# Patient Record
Sex: Male | Born: 1935
Health system: Southern US, Community
[De-identification: ages and names within clinical notes are randomized; demographics above are authoritative.]

## PROBLEM LIST (undated history)

## (undated) DIAGNOSIS — N400 Enlarged prostate without lower urinary tract symptoms: Secondary | ICD-10-CM

## (undated) DIAGNOSIS — I4892 Unspecified atrial flutter: Secondary | ICD-10-CM

## (undated) DIAGNOSIS — D649 Anemia, unspecified: Secondary | ICD-10-CM

## (undated) DIAGNOSIS — I5031 Acute diastolic (congestive) heart failure: Secondary | ICD-10-CM

## (undated) DIAGNOSIS — I214 Non-ST elevation (NSTEMI) myocardial infarction: Secondary | ICD-10-CM

## (undated) DIAGNOSIS — I10 Essential (primary) hypertension: Secondary | ICD-10-CM

## (undated) DIAGNOSIS — R0600 Dyspnea, unspecified: Secondary | ICD-10-CM

## (undated) DIAGNOSIS — I5032 Chronic diastolic (congestive) heart failure: Secondary | ICD-10-CM

## (undated) DIAGNOSIS — N289 Disorder of kidney and ureter, unspecified: Secondary | ICD-10-CM

## (undated) HISTORY — PX: CATARACT EXTRACTION, BILATERAL: SHX1313

## (undated) HISTORY — DX: Unspecified atrial flutter: I48.92

---

## 2008-11-12 HISTORY — PX: ABCESS DRAINAGE: SHX399

## 2015-04-18 DIAGNOSIS — M25562 Pain in left knee: Secondary | ICD-10-CM | POA: Diagnosis not present

## 2015-04-18 DIAGNOSIS — M25561 Pain in right knee: Secondary | ICD-10-CM | POA: Diagnosis not present

## 2015-06-18 DIAGNOSIS — E7800 Pure hypercholesterolemia, unspecified: Secondary | ICD-10-CM | POA: Diagnosis not present

## 2015-06-18 DIAGNOSIS — I10 Essential (primary) hypertension: Secondary | ICD-10-CM | POA: Diagnosis not present

## 2015-06-18 DIAGNOSIS — I739 Peripheral vascular disease, unspecified: Secondary | ICD-10-CM | POA: Diagnosis not present

## 2015-06-18 DIAGNOSIS — Z0000 Encounter for general adult medical examination without abnormal findings: Secondary | ICD-10-CM | POA: Diagnosis not present

## 2016-04-03 DIAGNOSIS — Z23 Encounter for immunization: Secondary | ICD-10-CM | POA: Diagnosis not present

## 2016-10-27 DIAGNOSIS — Z6829 Body mass index (BMI) 29.0-29.9, adult: Secondary | ICD-10-CM | POA: Diagnosis not present

## 2016-10-27 DIAGNOSIS — Z0000 Encounter for general adult medical examination without abnormal findings: Secondary | ICD-10-CM | POA: Diagnosis not present

## 2016-10-27 DIAGNOSIS — I10 Essential (primary) hypertension: Secondary | ICD-10-CM | POA: Diagnosis not present

## 2016-10-27 DIAGNOSIS — I739 Peripheral vascular disease, unspecified: Secondary | ICD-10-CM | POA: Diagnosis not present

## 2016-10-27 DIAGNOSIS — E7800 Pure hypercholesterolemia, unspecified: Secondary | ICD-10-CM | POA: Diagnosis not present

## 2016-12-30 DIAGNOSIS — I10 Essential (primary) hypertension: Secondary | ICD-10-CM | POA: Diagnosis not present

## 2016-12-30 DIAGNOSIS — I739 Peripheral vascular disease, unspecified: Secondary | ICD-10-CM | POA: Diagnosis not present

## 2017-02-09 ENCOUNTER — Encounter (HOSPITAL_COMMUNITY): Payer: Self-pay | Admitting: Emergency Medicine

## 2017-02-09 ENCOUNTER — Emergency Department (HOSPITAL_COMMUNITY): Payer: Medicare Other

## 2017-02-09 ENCOUNTER — Inpatient Hospital Stay (HOSPITAL_COMMUNITY)
Admission: EM | Admit: 2017-02-09 | Discharge: 2017-02-16 | DRG: 291 | Disposition: A | Discharge status: Home / Self Care | Payer: Medicare Other | Attending: Internal Medicine | Admitting: Internal Medicine

## 2017-02-09 DIAGNOSIS — I21A1 Myocardial infarction type 2: Secondary | ICD-10-CM | POA: Diagnosis not present

## 2017-02-09 DIAGNOSIS — I5033 Acute on chronic diastolic (congestive) heart failure: Secondary | ICD-10-CM | POA: Diagnosis present

## 2017-02-09 DIAGNOSIS — I4891 Unspecified atrial fibrillation: Secondary | ICD-10-CM | POA: Diagnosis present

## 2017-02-09 DIAGNOSIS — Z7982 Long term (current) use of aspirin: Secondary | ICD-10-CM | POA: Diagnosis not present

## 2017-02-09 DIAGNOSIS — J189 Pneumonia, unspecified organism: Secondary | ICD-10-CM | POA: Diagnosis present

## 2017-02-09 DIAGNOSIS — I10 Essential (primary) hypertension: Secondary | ICD-10-CM | POA: Diagnosis not present

## 2017-02-09 DIAGNOSIS — I5031 Acute diastolic (congestive) heart failure: Secondary | ICD-10-CM | POA: Diagnosis present

## 2017-02-09 DIAGNOSIS — R748 Abnormal levels of other serum enzymes: Secondary | ICD-10-CM | POA: Diagnosis not present

## 2017-02-09 DIAGNOSIS — I4892 Unspecified atrial flutter: Secondary | ICD-10-CM | POA: Diagnosis present

## 2017-02-09 DIAGNOSIS — R946 Abnormal results of thyroid function studies: Secondary | ICD-10-CM | POA: Diagnosis present

## 2017-02-09 DIAGNOSIS — R0602 Shortness of breath: Secondary | ICD-10-CM | POA: Diagnosis not present

## 2017-02-09 DIAGNOSIS — Z880 Allergy status to penicillin: Secondary | ICD-10-CM

## 2017-02-09 DIAGNOSIS — I2510 Atherosclerotic heart disease of native coronary artery without angina pectoris: Secondary | ICD-10-CM | POA: Diagnosis present

## 2017-02-09 DIAGNOSIS — Z79899 Other long term (current) drug therapy: Secondary | ICD-10-CM | POA: Diagnosis not present

## 2017-02-09 DIAGNOSIS — I4510 Unspecified right bundle-branch block: Secondary | ICD-10-CM | POA: Diagnosis present

## 2017-02-09 DIAGNOSIS — I110 Hypertensive heart disease with heart failure: Secondary | ICD-10-CM | POA: Diagnosis present

## 2017-02-09 DIAGNOSIS — R778 Other specified abnormalities of plasma proteins: Secondary | ICD-10-CM

## 2017-02-09 DIAGNOSIS — E876 Hypokalemia: Secondary | ICD-10-CM | POA: Diagnosis present

## 2017-02-09 DIAGNOSIS — J9601 Acute respiratory failure with hypoxia: Secondary | ICD-10-CM | POA: Diagnosis not present

## 2017-02-09 DIAGNOSIS — R7989 Other specified abnormal findings of blood chemistry: Secondary | ICD-10-CM

## 2017-02-09 DIAGNOSIS — I248 Other forms of acute ischemic heart disease: Secondary | ICD-10-CM | POA: Diagnosis present

## 2017-02-09 DIAGNOSIS — R12 Heartburn: Secondary | ICD-10-CM | POA: Diagnosis not present

## 2017-02-09 DIAGNOSIS — I2489 Other forms of acute ischemic heart disease: Secondary | ICD-10-CM | POA: Diagnosis present

## 2017-02-09 DIAGNOSIS — Z23 Encounter for immunization: Secondary | ICD-10-CM | POA: Diagnosis not present

## 2017-02-09 DIAGNOSIS — J810 Acute pulmonary edema: Secondary | ICD-10-CM | POA: Diagnosis not present

## 2017-02-09 DIAGNOSIS — R0600 Dyspnea, unspecified: Secondary | ICD-10-CM | POA: Diagnosis not present

## 2017-02-09 HISTORY — DX: Acute diastolic (congestive) heart failure: I50.31

## 2017-02-09 HISTORY — DX: Essential (primary) hypertension: I10

## 2017-02-09 HISTORY — DX: Disorder of kidney and ureter, unspecified: N28.9

## 2017-02-09 LAB — CBC
HCT: 37.3 % — ABNORMAL LOW (ref 39.0–52.0)
Hemoglobin: 12 g/dL — ABNORMAL LOW (ref 13.0–17.0)
MCH: 31.3 pg (ref 26.0–34.0)
MCHC: 32.2 g/dL (ref 30.0–36.0)
MCV: 97.1 fL (ref 78.0–100.0)
Platelets: 216 10*3/uL (ref 150–400)
RBC: 3.84 MIL/uL — AB (ref 4.22–5.81)
RDW: 14.7 % (ref 11.5–15.5)
WBC: 9.5 10*3/uL (ref 4.0–10.5)

## 2017-02-09 LAB — BASIC METABOLIC PANEL
ANION GAP: 9 (ref 5–15)
BUN: 35 mg/dL — ABNORMAL HIGH (ref 6–20)
CO2: 31 mmol/L (ref 22–32)
Calcium: 10.1 mg/dL (ref 8.9–10.3)
Chloride: 99 mmol/L — ABNORMAL LOW (ref 101–111)
Creatinine, Ser: 0.92 mg/dL (ref 0.61–1.24)
Glucose, Bld: 151 mg/dL — ABNORMAL HIGH (ref 65–99)
POTASSIUM: 3.5 mmol/L (ref 3.5–5.1)
SODIUM: 139 mmol/L (ref 135–145)

## 2017-02-09 LAB — LIPID PANEL
CHOLESTEROL: 145 mg/dL (ref 0–200)
HDL: 44 mg/dL (ref >40)
LDL Cholesterol: 90 mg/dL (ref 0–99)
TRIGLYCERIDES: 57 mg/dL (ref <150)
Total CHOL/HDL Ratio: 3.3 RATIO
VLDL: 11 mg/dL (ref 0–40)

## 2017-02-09 LAB — TROPONIN I
TROPONIN I: 1.77 ng/mL — AB (ref <0.03)
Troponin I: 2.36 ng/mL (ref <0.03)
Troponin I: 2.54 ng/mL (ref <0.03)

## 2017-02-09 LAB — HEPARIN LEVEL (UNFRACTIONATED): Heparin Unfractionated: 0.23 IU/mL — ABNORMAL LOW (ref 0.30–0.70)

## 2017-02-09 LAB — MRSA PCR SCREENING: MRSA BY PCR: NEGATIVE

## 2017-02-09 LAB — PROTIME-INR
INR: 1.15
Prothrombin Time: 14.6 seconds (ref 11.4–15.2)

## 2017-02-09 LAB — TSH: TSH: 5.714 u[IU]/mL — ABNORMAL HIGH (ref 0.350–4.500)

## 2017-02-09 LAB — MAGNESIUM: MAGNESIUM: 1.9 mg/dL (ref 1.7–2.4)

## 2017-02-09 MED ORDER — HEPARIN BOLUS VIA INFUSION
4000.0000 [IU] | Freq: Once | INTRAVENOUS | Status: AC
  Administered 2017-02-09: 4000 [IU] via INTRAVENOUS

## 2017-02-09 MED ORDER — DILTIAZEM HCL-DEXTROSE 100-5 MG/100ML-% IV SOLN (PREMIX)
5.0000 mg/h | INTRAVENOUS | Status: DC
  Administered 2017-02-09: 7.5 mg/h via INTRAVENOUS
  Administered 2017-02-10 – 2017-02-11 (×2): 2.5 mg/h via INTRAVENOUS
  Filled 2017-02-09 (×3): qty 100

## 2017-02-09 MED ORDER — DILTIAZEM HCL 25 MG/5ML IV SOLN
10.0000 mg | Freq: Once | INTRAVENOUS | Status: AC
  Administered 2017-02-09: 10 mg via INTRAVENOUS
  Filled 2017-02-09: qty 5

## 2017-02-09 MED ORDER — HYDROCHLOROTHIAZIDE 25 MG PO TABS
25.0000 mg | ORAL_TABLET | Freq: Every day | ORAL | Status: DC
  Administered 2017-02-10 – 2017-02-16 (×7): 25 mg via ORAL
  Filled 2017-02-09 (×7): qty 1

## 2017-02-09 MED ORDER — ASPIRIN 81 MG PO CHEW
324.0000 mg | CHEWABLE_TABLET | Freq: Once | ORAL | Status: AC
  Administered 2017-02-09: 324 mg via ORAL
  Filled 2017-02-09: qty 4

## 2017-02-09 MED ORDER — ONDANSETRON HCL 4 MG/2ML IJ SOLN: 4.0000 mg | Freq: Four times a day (QID) | INTRAMUSCULAR | Status: DC | PRN

## 2017-02-09 MED ORDER — METOPROLOL TARTRATE 25 MG PO TABS
25.0000 mg | ORAL_TABLET | Freq: Two times a day (BID) | ORAL | Status: DC
  Administered 2017-02-09 (×2): 25 mg via ORAL
  Filled 2017-02-09 (×2): qty 1

## 2017-02-09 MED ORDER — HEPARIN (PORCINE) IN NACL 100-0.45 UNIT/ML-% IJ SOLN
1700.0000 [IU]/h | INTRAMUSCULAR | Status: DC
  Administered 2017-02-09: 1100 [IU]/h via INTRAVENOUS
  Administered 2017-02-10: 1700 [IU]/h via INTRAVENOUS
  Administered 2017-02-10: 1300 [IU]/h via INTRAVENOUS
  Administered 2017-02-11 – 2017-02-15 (×6): 1700 [IU]/h via INTRAVENOUS
  Filled 2017-02-09 (×10): qty 250

## 2017-02-09 MED ORDER — SODIUM CHLORIDE 0.9% FLUSH
3.0000 mL | INTRAVENOUS | Status: DC | PRN
Start: 2017-02-09 — End: 2017-02-16

## 2017-02-09 MED ORDER — SODIUM CHLORIDE 0.9 % IV SOLN
INTRAVENOUS | Status: DC
  Administered 2017-02-09: 11:00:00 via INTRAVENOUS

## 2017-02-09 MED ORDER — FUROSEMIDE 10 MG/ML IJ SOLN
60.0000 mg | Freq: Once | INTRAMUSCULAR | Status: AC
  Administered 2017-02-09: 60 mg via INTRAVENOUS
  Filled 2017-02-09: qty 6

## 2017-02-09 MED ORDER — INFLUENZA VAC SPLIT HIGH-DOSE 0.5 ML IM SUSY
0.5000 mL | PREFILLED_SYRINGE | INTRAMUSCULAR | Status: AC
  Administered 2017-02-10: 0.5 mL via INTRAMUSCULAR
  Filled 2017-02-09: qty 0.5

## 2017-02-09 MED ORDER — ASPIRIN EC 81 MG PO TBEC
81.0000 mg | DELAYED_RELEASE_TABLET | Freq: Every day | ORAL | Status: DC
  Administered 2017-02-09 – 2017-02-16 (×8): 81 mg via ORAL
  Filled 2017-02-09 (×8): qty 1

## 2017-02-09 MED ORDER — SODIUM CHLORIDE 0.9 % IV SOLN: 250.0000 mL | INTRAVENOUS | Status: DC | PRN

## 2017-02-09 MED ORDER — DILTIAZEM LOAD VIA INFUSION
10.0000 mg | Freq: Once | INTRAVENOUS | Status: AC
  Administered 2017-02-09: 10 mg via INTRAVENOUS
  Filled 2017-02-09: qty 10

## 2017-02-09 MED ORDER — ACETAMINOPHEN 325 MG PO TABS: 650.0000 mg | ORAL_TABLET | Freq: Four times a day (QID) | ORAL | Status: DC | PRN

## 2017-02-09 MED ORDER — ONDANSETRON HCL 4 MG PO TABS
4.0000 mg | ORAL_TABLET | Freq: Four times a day (QID) | ORAL | Status: DC | PRN
  Administered 2017-02-14: 4 mg via ORAL
  Filled 2017-02-09: qty 1

## 2017-02-09 MED ORDER — ALUM & MAG HYDROXIDE-SIMETH 200-200-20 MG/5ML PO SUSP
15.0000 mL | ORAL | Status: DC | PRN
  Administered 2017-02-09 – 2017-02-15 (×6): 15 mL via ORAL
  Filled 2017-02-09 (×6): qty 30

## 2017-02-09 MED ORDER — ACETAMINOPHEN 650 MG RE SUPP: 650.0000 mg | Freq: Four times a day (QID) | RECTAL | Status: DC | PRN

## 2017-02-09 NOTE — ED Notes (Signed)
[phone] Michael Stephens

## 2017-02-09 NOTE — Progress Notes (Addendum)
ANTICOAGULATION CONSULT NOTE - Initial Consult  Pharmacy Consult for Heparin Indication: chest pain/ACS  Allergies  Allergen Reactions  . Penicillins     Has patient had a PCN reaction causing immediate rash, facial/tongue/throat swelling, SOB or lightheadedness with hypotension: Yes Has patient had a PCN reaction causing severe rash involving mucus membranes or skin necrosis: No Has patient had a PCN reaction that required hospitalization: No Has patient had a PCN reaction occurring within the last 10 years: No If all of the above answers are "NO", then may proceed with Cephalosporin use.    Patient Measurements: Height: 6' (182.9 cm) Weight: 206 lb (93.4 kg) IBW/kg (Calculated) : 77.6 HEPARIN DW (KG): 93.4  Vital Signs: Temp: 98.3 F (36.8 C) (10/29 1019) Temp Source: Oral (10/29 1019) BP: 134/100 (10/29 1100) Pulse Rate: 46 (10/29 1100)  Labs:  Recent Labs  02/09/17 1028  HGB 12.0*  HCT 37.3*  PLT 216  CREATININE 0.92  TROPONINI 1.77*    Estimated Creatinine Clearance: 76 mL/min (by C-G formula based on SCr of 0.92 mg/dL).   Medical History: Past Medical History:  Diagnosis Date  . Hypertension   . Renal disorder    cyst on kidney    Medications:  See med rec  Assessment: 82 yo male present to ED with tachycardia. No chest pain, no N/V. He has Elevated troponins.  Possible NSTEMI vs elevated troponin related to the stress of the tachycardia.  Pt remains asymptomatic in the ED. Chadsvasc score =3.  Cardizem ordered for rate control.  MD asked Pharmacy to initiate heparin infusion.  Goal of Therapy:  Heparin level 0.3-0.7 units/ml Monitor platelets by anticoagulation protocol: Yes   Plan:  Give 4000 units bolus x 1 Start heparin infusion at 1100 units/hr Check anti-Xa level in 8 hours and daily while on heparin Continue to monitor H&H and platelets  Isac Sarna, BS Pharm D, BCPS Clinical Pharmacist Pager [phone] 02/09/2017,11:45 AM    Addum:  Initial heparin level <goal.  Increase drip to 1300 unkts/hr.  F/u am labs Excell Seltzer, PharmD

## 2017-02-09 NOTE — ED Notes (Signed)
Pt would like a meal tray, but doctor has not put in orders for one yet.

## 2017-02-09 NOTE — ED Provider Notes (Signed)
[facility] EMERGENCY DEPARTMENT Provider Note   CSN: [id number] Arrival date & time: 02/09/17  1002     History   Chief Complaint Chief Complaint  Patient presents with  . Tachycardia    HPI Michael Stephens is a 82 y.o. male.  HPI Patient presents to the emergency room for evaluation of tachycardia.  Patient went to his primary care doctor's office for a flu shot.  They noted that his heart rate was rapid.  He was sent to the emergency room for further evaluation.  Patient denies any trouble with chest pain.  He has noticed intermittent shortness of breath but that seems to be going on for years.  He also has noticed some mild swelling in his ankles.  He denies any fevers or chills.  No vomiting or diarrhea.  No history of irregular heart rhythm problems.  Patient denies any drug or alcohol use.  He does not smoke. Past Medical History:  Diagnosis Date  . Hypertension   . Renal disorder    cyst on kidney    There are no active problems to display for this patient.   History reviewed. No pertinent surgical history.     Home Medications    Prior to Admission medications   Medication Sig Start Date End Date Taking? Authorizing Provider  losartan-hydrochlorothiazide (HYZAAR) 100-12.5 MG tablet Take 1 tablet by mouth daily.   Yes [provider]    Family History History reviewed. No pertinent family history.  Social History Social History  Substance Use Topics  . Smoking status: Never Smoker  . Smokeless tobacco: Never Used  . Alcohol use No     Allergies   Penicillins   Review of Systems Review of Systems  All other systems reviewed and are negative.    Physical Exam Updated Vital Signs BP (!) 134/100   Pulse (!) 46   Temp 98.3 F (36.8 C) (Oral)   Resp (!) 25   Ht 1.829 m (6')   Wt 93.4 kg (206 lb)   SpO2 96%   BMI 27.94 kg/m   Physical Exam  Constitutional: He appears well-developed and well-nourished. No distress.  HENT:  Head:  Normocephalic and atraumatic.  Right Ear: External ear normal.  Left Ear: External ear normal.  Eyes: Conjunctivae are normal. Right eye exhibits no discharge. Left eye exhibits no discharge. No scleral icterus.  Neck: Neck supple. No tracheal deviation present.  Cardiovascular: Intact distal pulses.  An irregularly irregular rhythm present. Tachycardia present.   Pulmonary/Chest: Effort normal and breath sounds normal. No stridor. No respiratory distress. He has no wheezes. He has no rales.  Abdominal: Soft. Bowel sounds are normal. He exhibits no distension. There is no tenderness. There is no rebound and no guarding.  Musculoskeletal: He exhibits no edema or tenderness.  Neurological: He is alert. He has normal strength. No cranial nerve deficit (no facial droop, extraocular movements intact, no slurred speech) or sensory deficit. He exhibits normal muscle tone. He displays no seizure activity. Coordination normal.  Skin: Skin is warm and dry. No rash noted.  Psychiatric: He has a normal mood and affect.  Nursing note and vitals reviewed.    ED Treatments / Results  Labs (all labs ordered are listed, but only abnormal results are displayed) Labs Reviewed  BASIC METABOLIC PANEL - Abnormal; Notable for the following:       Result Value   Chloride 99 (*)    Glucose, Bld 151 (*)    BUN 35 (*)  All other components within normal limits  CBC - Abnormal; Notable for the following:    RBC 3.84 (*)    Hemoglobin 12.0 (*)    HCT 37.3 (*)    All other components within normal limits  TROPONIN I - Abnormal; Notable for the following:    Troponin I 1.77 (*)    All other components within normal limits  TSH - Abnormal; Notable for the following:    TSH 5.714 (*)    All other components within normal limits  MAGNESIUM    EKG  EKG Interpretation  Date/Time:  Monday February 09 2017 10:18:20 EDT Ventricular Rate:  137 PR Interval:    QRS Duration: 94 QT Interval:  329 QTC  Calculation: 445 R Axis:   -10 Text Interpretation:  atrrial flutter with variable rate Inferior infarct, old Lateral leads are also involved No old tracing to compare Confirmed by Dorie Rank [phone]) on 02/09/2017 10:32:46 AM       Radiology No results found.  Procedures .Critical Care Performed by: Dorie Rank Authorized by: Dorie Rank   Critical care provider statement:    Critical care time (minutes):  35   Critical care was time spent personally by me on the following activities:  Discussions with consultants, evaluation of patient's response to treatment, examination of patient, ordering and performing treatments and interventions, ordering and review of laboratory studies, ordering and review of radiographic studies, pulse oximetry, re-evaluation of patient's condition, obtaining history from patient or surrogate and review of old charts   (including critical care time)  Medications Ordered in ED Medications  0.9 %  sodium chloride infusion ( Intravenous New Bag/Given 02/09/17 1037)  diltiazem (CARDIZEM) 1 mg/mL load via infusion 10 mg (10 mg Intravenous Bolus from Bag 02/09/17 1134)    And  diltiazem (CARDIZEM) 100 mg in dextrose 5% 123mL (1 mg/mL) infusion (5 mg/hr Intravenous New Bag/Given 02/09/17 1135)  diltiazem (CARDIZEM) injection 10 mg (10 mg Intravenous Given 02/09/17 1039)  aspirin chewable tablet 324 mg (324 mg Oral Given 02/09/17 1134)     Initial Impression / Assessment and Plan / ED Course  I have reviewed the triage vital signs and the nursing notes.  Pertinent labs & imaging results that were available during my care of the patient were reviewed by me and considered in my medical decision making (see chart for details).  Clinical Course as of Feb 09 1137  Mon Feb 09, 2017  1115 Patient has an elevated troponin at 1.77.  He denies any chest pain however he is tachycardic and appears to be in new onset atrial flutter.  [DV]  7616 I have ordered Cardizem to  help with his rate control.  I will consult with cardiology  [JK]  1130 DIscussed Dr Harrington Challenger.  Will see patient in consultation.  Recommends admission here for further workup.  I will consult with the hospitalist service.  [JK]  1135 At the bedside heart rate is fluctuating from low 100s-130s.  Nurse is getting ready to hang the Cardizem drip  [JK]    Clinical Course User Index [JK] Dorie Rank, MD    Patient presented to the emergency room for evaluation of tachycardia.  Patient denies any chest pain but in retrospect it sounds like he has had some intermittent episodes of shortness of breath related to exertion.  Possible this is been an anginal equivalent.  Patient has elevated troponins.  He also has an elevated TSH.  He does appear to be in new onset  either atrial fibrillation or atrial flutter.  Possible nstemi vs elevated troponin related to the stress of the tachycardia.  Pt remains asymptomatic in the ED. Chadsvasc score =3.  Cardizem ordered for rate control.  Will start on heparin drip.  Consulted with Dr Harrington Challenger, cardiology.  I will consult with the medical service for admission and further evaluation.  Final Clinical Impressions(s) / ED Diagnoses   Final diagnoses:  Atrial flutter with rapid ventricular response ([place])  Elevated troponin  Elevated TSH     Dorie Rank, MD 02/09/17 1139

## 2017-02-09 NOTE — ED Triage Notes (Signed)
Sent from PCP office for increased heart rate. Pt denies chest pain. SOB with exertion and lying flat.

## 2017-02-09 NOTE — ED Notes (Signed)
Pt to xray

## 2017-02-09 NOTE — ED Notes (Signed)
CRITICAL VALUE ALERT  Critical Value:  Troponin 1.77  Date & Time Notied:  1107 02/09/17  Provider Notified: Dr Tomi Bamberger  Orders Received/Actions taken: No orders given at this time

## 2017-02-09 NOTE — Consult Note (Signed)
Cardiology Admission History and Physical:   Patient ID: Michael Stephens; MRN: [id number]; DOB: May 31, 1934   Admission date: 02/09/2017  Primary Care Provider: Deloria Lair., MD Primary Cardiologist: New   Chief Complaint:  Asked to see by Dr Tomi Bamberger for atrial fibrillation and elevated troponin  Patient Profile:   Michael Stephens is a 82 y.o. male with atrial fibrillation and elevated troponin  History of Present Illness:   Michael Stephens is a 82 y.o. male who went to primary MD today for flu shot  Found to be tachycardic  Sent to ED The patient says he has intermittent heart racing for years.  Often when he tries to lefit things Friday he was doing work at home  Felt heart racing  No dizziness  Continued through night  On Saturday AM he slowed, went back to normal   Started racing again later in afternoon  Yesterday felt OK  Today called Primary cares office  Said his heart was racing   Also had some LE edema  The notes occasional chest pains  Fleeting  Not assocated with activity  Does however note some dyspnea on exertion.  Also notes some LE edeam    Currently denies palpitations     Past Medical History:  Diagnosis Date  . Hypertension   . Renal disorder    cyst on kidney    History reviewed. No pertinent surgical history.   Medications Prior to Admission: Prior to Admission medications   Not on File     Allergies:    Allergies  Allergen Reactions  . Penicillins     Has patient had a PCN reaction causing immediate rash, facial/tongue/throat swelling, SOB or lightheadedness with hypotension: Yes Has patient had a PCN reaction causing severe rash involving mucus membranes or skin necrosis: No Has patient had a PCN reaction that required hospitalization: No Has patient had a PCN reaction occurring within the last 10 years: No If all of the above answers are "NO", then may proceed with Cephalosporin use.    Social History:   Social History   Social History  .  Marital status: Married    Spouse name: N/A  . Number of children: N/A  . Years of education: N/A   Occupational History  . Not on file.   Social History Main Topics  . Smoking status: Never Smoker  . Smokeless tobacco: Never Used  . Alcohol use No  . Drug use: No  . Sexual activity: No   Other Topics Concern  . Not on file   Social History Narrative  . No narrative on file    Family History:  The patient's family history is not on file.    ROS:   SOme loose Bowel movements  Otherwise  All systems neg.   Physical Exam/Data:   Vitals:   02/09/17 1014 02/09/17 1019 02/09/17 1030  BP:  (!) 178/98 (!) 151/112  Pulse:  (!) 127 (!) 164  Resp:  (!) 33 (!) 36  Temp:  98.3 F (36.8 C)   TempSrc:  Oral   SpO2:  92% 93%  Weight: 206 lb (93.4 kg)    Height: 6' (1.829 m)     No intake or output data in the 24 hours ending 02/09/17 1135 Filed Weights   02/09/17 1014  Weight: 206 lb (93.4 kg)   Body mass index is 27.94 kg/m.  General:  Well nourished, well developed, in no acute distress HEENT: normal Lymph: no adenopathy Neck  JVP is increased  No bruits   Endocrine:  No thryomegaly Vascular: No carotid bruits; FA pulses 2+ bilaterally without bruits  Cardiac:  Irreg irreg  Normal S1, S2  No S3  Gr II/VI sytolic murmur  Lungs:  Mild rhonchi bilaterally   Abd: soft, nontender, no hepatomegaly  Ext: 1+  edema Musculoskeletal:  No deformities, BUE and BLE strength normal and equal Skin: warm and dry  Neuro:  CNs 2-12 intact, no focal abnormalities noted Psych:  Normal affect    EKG:  The ECG that was done  Atypical atrial flutter  137 bpm  IWMI    Relevant CV Studies: Echo ordered    Laboratory Data:  Chemistry Recent Labs Lab 02/09/17 1028  NA 139  K 3.5  CL 99*  CO2 31  GLUCOSE 151*  BUN 35*  CREATININE 0.92  CALCIUM 10.1  GFRNONAA >60  GFRAA >60  ANIONGAP 9    No results for input(s): PROT, ALBUMIN, AST, ALT, ALKPHOS, BILITOT in the last 168  hours. Hematology Recent Labs Lab 02/09/17 1028  WBC 9.5  RBC 3.84*  HGB 12.0*  HCT 37.3*  MCV 97.1  MCH 31.3  MCHC 32.2  RDW 14.7  PLT 216   Cardiac Enzymes Recent Labs Lab 02/09/17 1028  TROPONINI 1.77*   No results for input(s): TROPIPOC in the last 168 hours.  BNPNo results for input(s): BNP, PROBNP in the last 168 hours.  DDimer No results for input(s): DDIMER in the last 168 hours.  Radiology/Studies:  No results found.  Assessment and Plan:   Pt is an 9 with no prior cardiac history  Seen by his primeary MD today  Found to be tachycardic   Sent to ED  Here found to be in atrial flutter with RVR On exam:  Some increased volume on exam  HR is still elevated  . Labs signif for trop 1.77  1  Atrial flutter  Rate control with dilti and b blocker   Heparin for anticoagulation   Get echo to eval LV,RV, atrial size   CHADSVASC score is at least 4    2  Dyspnea  Pt tachycardic and has increased volume on exam  I would give IV lasix once and follow respones  Get echo  3  Elevated troponin  I am not convinced of active ischemia  May rellect demand  inschemia  I would recomm cycling  Get echo  Further eval depends on test results  Rx with asa.  4  HTN  Will need to be followed    5  Lipids  Check in AM      For questions or updates, please contact [facility] Please consult [email or web address] for contact info under Cardiology/STEMI.    Signed, Dorris Carnes, MD  02/09/2017 11:35 AM

## 2017-02-09 NOTE — ED Notes (Signed)
Have tried to give report on pt, but noticed that patient was placed on unit 300. Per nurse on this floor, they do not usually take people on Cardizem drips. Rn states she will call me back once she has spoken to charge nurse

## 2017-02-09 NOTE — H&P (Signed)
History and Physical    Michael Stephens IZT:[id number] DOB: 1934-08-09 DOA: 02/09/2017  PCP: Deloria Lair., MD   Patient coming from: Home  Chief Complaint: Dyspnea and palpitations.   HPI: Michael Stephens is a 82 y.o. male with medical history significant of hypertension, who presents with dyspnea and palpitations for last 72 hours. Palpitations have been intermittent, moderate to severe intensity, have been increasing in frequency and duration, not exertion related, associated with lower extremity edema, and worsening dyspnea on exertion. To me denies PND orthopnea. Today his heart rate was found to be elevated when he went to get influenza vaccination and he was referred to the nursing home for evaluation.  In the past he had experienced palpitations mainly on exertion and self limiting. Otherwise patient is physically active at his baseline denies any angina or claudication.   ED Course: Patient was found tachycardic, telemetry showing atrial flutter, received furosemide, IV diltiazem and IV heparin, refer for admission and further evaluation.  Review of Systems:  1. General: No fevers, no chills, no weight gain or weight loss 2. ENT: No runny nose or sore throat, no hearing disturbances 3. Pulmonary: positive dyspnea on exertion, no cough, wheezing, or hemoptysis 4. Cardiovascular: No angina, claudication, positive lower extremity edema, but no pnd or orthopnea 5. Gastrointestinal: No nausea or vomiting, positive diarrhea over last 72 hours 6. Hematology: No easy bruisability or frequent infections 7. Urology: No dysuria, hematuria or increased urinary frequency 8. Dermatology: No rashes. 9. Neurology: No seizures or paresthesias 10. Musculoskeletal: No joint pain or deformities  Past Medical History:  Diagnosis Date  . Hypertension   . Renal disorder    cyst on kidney    History reviewed. No pertinent surgical history.   reports that he has never smoked. He has never used  smokeless tobacco. He reports that he does not drink alcohol or use drugs.  Allergies  Allergen Reactions  . Penicillins     Has patient had a PCN reaction causing immediate rash, facial/tongue/throat swelling, SOB or lightheadedness with hypotension: Yes Has patient had a PCN reaction causing severe rash involving mucus membranes or skin necrosis: No Has patient had a PCN reaction that required hospitalization: No Has patient had a PCN reaction occurring within the last 10 years: No If all of the above answers are "NO", then may proceed with Cephalosporin use.    History reviewed. No pertinent family history. Family history was reviewed and found to be non contributory.     Prior to Admission medications   Medication Sig Start Date End Date Taking? Authorizing Provider  aspirin 325 MG tablet Take 325 mg by mouth every 4 (four) hours as needed for mild pain, moderate pain or headache.   Yes [provider]  Calcium Carbonate Antacid (ALKA-SELTZER ANTACID PO) Take 1 Dose by mouth daily as needed (indegestion).   Yes [provider]  losartan-hydrochlorothiazide (HYZAAR) 100-12.5 MG tablet Take 1 tablet by mouth daily.   Yes [provider]    Physical Exam: Vitals:   02/09/17 1014 02/09/17 1019 02/09/17 1030 02/09/17 1100  BP:  (!) 178/98 (!) 151/112 (!) 134/100  Pulse:  (!) 127 (!) 164 (!) 46  Resp:  (!) 33 (!) 36 (!) 25  Temp:  98.3 F (36.8 C)    TempSrc:  Oral    SpO2:  92% 93% 96%  Weight: 93.4 kg (206 lb)     Height: 6' (1.829 m)       Constitutional: deconditioned Vitals:  02/09/17 1014 02/09/17 1019 02/09/17 1030 02/09/17 1100  BP:  (!) 178/98 (!) 151/112 (!) 134/100  Pulse:  (!) 127 (!) 164 (!) 46  Resp:  (!) 33 (!) 36 (!) 25  Temp:  98.3 F (36.8 C)    TempSrc:  Oral    SpO2:  92% 93% 96%  Weight: 93.4 kg (206 lb)     Height: 6' (1.829 m)      Eyes: PERRL, lids and conjunctivae normal Head normocephalic, nose and ears with no  deformities. Neck with moderate JVD.  ENMT: Mucous membranes are moist. Posterior pharynx clear of any exudate or lesions.Normal dentition.  Neck: normal, supple, no masses, no thyromegaly Respiratory: decreased breath sounds on auscultation bilaterally, no wheezing, but positive bibasilar crackles. Normal respiratory effort. No accessory muscle use.  Cardiovascular: Regular tachycardic and irregular, no s3 or s4 gallop, no murmurs / rubs. Positive extremity edema ++ pitting. 2+ pedal pulses. No carotid bruits.  Abdomen: protuberant, no tenderness, no masses palpated. No hepatosplenomegaly. Bowel sounds positive.  Musculoskeletal: no clubbing / cyanosis. No joint deformity upper and lower extremities. Good ROM, no contractures. Normal muscle tone.  Skin: no rashes, lesions, ulcers. No induration Neurologic: CN 2-12 grossly intact. Sensation intact, DTR normal. Strength 5/5 in all 4.      Labs on Admission: I have personally reviewed following labs and imaging studies  CBC:  Recent Labs Lab 02/09/17 1028  WBC 9.5  HGB 12.0*  HCT 37.3*  MCV 97.1  PLT 564   Basic Metabolic Panel:  Recent Labs Lab 02/09/17 1028  NA 139  K 3.5  CL 99*  CO2 31  GLUCOSE 151*  BUN 35*  CREATININE 0.92  CALCIUM 10.1  MG 1.9   GFR: Estimated Creatinine Clearance: 76 mL/min (by C-G formula based on SCr of 0.92 mg/dL). Liver Function Tests: No results for input(s): AST, ALT, ALKPHOS, BILITOT, PROT, ALBUMIN in the last 168 hours. No results for input(s): LIPASE, AMYLASE in the last 168 hours. No results for input(s): AMMONIA in the last 168 hours. Coagulation Profile: No results for input(s): INR, PROTIME in the last 168 hours. Cardiac Enzymes:  Recent Labs Lab 02/09/17 1028  TROPONINI 1.77*   BNP (last 3 results) No results for input(s): PROBNP in the last 8760 hours. HbA1C: No results for input(s): HGBA1C in the last 72 hours. CBG: No results for input(s): GLUCAP in the last 168  hours. Lipid Profile: No results for input(s): CHOL, HDL, LDLCALC, TRIG, CHOLHDL, LDLDIRECT in the last 72 hours. Thyroid Function Tests:  Recent Labs  02/09/17 1028  TSH 5.714*   Anemia Panel: No results for input(s): VITAMINB12, FOLATE, FERRITIN, TIBC, IRON, RETICCTPCT in the last 72 hours. Urine analysis: No results found for: COLORURINE, APPEARANCEUR, LABSPEC, PHURINE, GLUCOSEU, HGBUR, BILIRUBINUR, KETONESUR, PROTEINUR, UROBILINOGEN, NITRITE, LEUKOCYTESUR  Radiological Exams on Admission: No results found.  EKG: Independently reviewed. EKG normal axis, atrial flutter with variable block 2:1 and 1:1, with rate 137 bmp.   Assessment/Plan Active Problems:   Atrial flutter ([place])  82 year old male who presents with palpitations, dyspnea on exertion and lower extremity edema for the last 72 hours, denies any angina or claudication. On initial physical examination blood pressure 178/98, heart rate 127, respiratory rate 33, oxygen saturation 100% on room air. Moist mucous membranes, moderate JVD, lungs with bilateral rails more prominent bases, heart S1-S2 present, irregular no gallops or murmurs, abdomen protuberant, lower extremities with 2+ pitting edema bilaterally. Sodium 139, potassium 3.5, chloride 99, bicarbonate 31, glucose 151,  BUN 35, creatinine 0.92, troponin 1.77, white count 9.5, hemoglobin 12.0, hematocrit 37.3, platelets 216, tSH 5.7, chest x-ray personally review with increase interstitial markings bilaterally, small bilateral effusions, cardiomegaly.   Patient will be admitted to the hospital with working diagnosis of atrial flutter with rapid ventricular response complicated with cardiogenic pulmonary edema/ acute diastolic heart failure.   1. Atrial flutter with rapid ventricular response and elevated troponins. Patient will be admitted to the medical unit, he will be placed on a remote telemetry monitor, continue diltiazem infusion, target heart rate less than 130 bpm,  anticoagulation with unfractionated IV heparin. Will check echocardiogram once heart rate is controlled. Continue to trend cardiac enzymes,certainly 3 atrial flutter combined heart failure can elevate troponins but due to significant elevation of cardiac markers likely patient will need ischemia workup on this admission  2. Cardiogenic pulmonary edema due to acute diastolic heart failure. Patient will be diuresed with furosemide 40 mg intravenously daily, strict in and outs and daily weights. Continue rate control with diltiazem infusion.  3. Hypertension. Hold on losartan, to prevent hypotension, continue hydrochlorothiazide 25 mg daily.  4. Elevated TSH. Likely due to acute illness, will check free T4 and T3.  DVT prophylaxis:  heparin Code Status: full  Family Communication: I spoke with patient's wife at the bedside and all questions were addressed.  Disposition Plan: home  Consults called: Cardiology  Admission status: Inpatient.     Jonasia Coiner Gerome Apley MD [facility] Pager [phone]  If 7PM-7AM, please contact night-coverage [email or web address] Password [email or web address]  02/09/2017, 11:50 AM

## 2017-02-09 NOTE — ED Notes (Signed)
Notified pt that I needed to collect urine output from urinal, but pt refused. Pt is not using restroom and was able to urinate adequately

## 2017-02-10 ENCOUNTER — Encounter (HOSPITAL_COMMUNITY): Payer: Self-pay | Admitting: Adult Health

## 2017-02-10 ENCOUNTER — Inpatient Hospital Stay (HOSPITAL_COMMUNITY): Payer: Medicare Other

## 2017-02-10 DIAGNOSIS — I10 Essential (primary) hypertension: Secondary | ICD-10-CM

## 2017-02-10 DIAGNOSIS — I4892 Unspecified atrial flutter: Secondary | ICD-10-CM

## 2017-02-10 LAB — COMPREHENSIVE METABOLIC PANEL
ALT: 67 U/L — AB (ref 17–63)
AST: 39 U/L (ref 15–41)
Albumin: 3.7 g/dL (ref 3.5–5.0)
Alkaline Phosphatase: 53 U/L (ref 38–126)
Anion gap: 8 (ref 5–15)
BILIRUBIN TOTAL: 1.4 mg/dL — AB (ref 0.3–1.2)
BUN: 39 mg/dL — AB (ref 6–20)
CO2: 30 mmol/L (ref 22–32)
CREATININE: 0.9 mg/dL (ref 0.61–1.24)
Calcium: 9.1 mg/dL (ref 8.9–10.3)
Chloride: 96 mmol/L — ABNORMAL LOW (ref 101–111)
GFR calc Af Amer: >60 mL/min (ref >60)
Glucose, Bld: 117 mg/dL — ABNORMAL HIGH (ref 65–99)
Potassium: 3.4 mmol/L — ABNORMAL LOW (ref 3.5–5.1)
Sodium: 134 mmol/L — ABNORMAL LOW (ref 135–145)
TOTAL PROTEIN: 6.4 g/dL — AB (ref 6.5–8.1)

## 2017-02-10 LAB — CBC
HEMATOCRIT: 34 % — AB (ref 39.0–52.0)
Hemoglobin: 11 g/dL — ABNORMAL LOW (ref 13.0–17.0)
MCH: 31.3 pg (ref 26.0–34.0)
MCHC: 32.4 g/dL (ref 30.0–36.0)
MCV: 96.9 fL (ref 78.0–100.0)
Platelets: 208 10*3/uL (ref 150–400)
RBC: 3.51 MIL/uL — AB (ref 4.22–5.81)
RDW: 14.7 % (ref 11.5–15.5)
WBC: 9.5 10*3/uL (ref 4.0–10.5)

## 2017-02-10 LAB — HEPARIN LEVEL (UNFRACTIONATED)
HEPARIN UNFRACTIONATED: 0.25 [IU]/mL — AB (ref 0.30–0.70)
Heparin Unfractionated: 0.26 IU/mL — ABNORMAL LOW (ref 0.30–0.70)

## 2017-02-10 LAB — BRAIN NATRIURETIC PEPTIDE: B Natriuretic Peptide: 212 pg/mL — ABNORMAL HIGH (ref 0.0–100.0)

## 2017-02-10 LAB — TROPONIN I
Troponin I: 2.26 ng/mL
Troponin I: 1.69 ng/mL (ref <0.03)
Troponin I: 1.11 ng/mL (ref <0.03)

## 2017-02-10 LAB — ECHOCARDIOGRAM COMPLETE
Height: 72 in
Weight: 3262.81 [oz_av]

## 2017-02-10 LAB — MAGNESIUM: Magnesium: 2 mg/dL (ref 1.7–2.4)

## 2017-02-10 MED ORDER — FUROSEMIDE 10 MG/ML IJ SOLN: 40.0000 mg | Freq: Every day | INTRAMUSCULAR | Status: DC

## 2017-02-10 MED ORDER — LEVALBUTEROL HCL 0.63 MG/3ML IN NEBU
0.6300 mg | INHALATION_SOLUTION | Freq: Four times a day (QID) | RESPIRATORY_TRACT | Status: DC | PRN
  Administered 2017-02-10: 0.63 mg via RESPIRATORY_TRACT

## 2017-02-10 MED ORDER — LEVALBUTEROL HCL 0.63 MG/3ML IN NEBU
INHALATION_SOLUTION | RESPIRATORY_TRACT | Status: AC
  Filled 2017-02-10: qty 3

## 2017-02-10 MED ORDER — METOPROLOL TARTRATE 25 MG PO TABS
25.0000 mg | ORAL_TABLET | Freq: Two times a day (BID) | ORAL | Status: DC
  Administered 2017-02-10 (×2): 25 mg via ORAL
  Filled 2017-02-10 (×2): qty 1

## 2017-02-10 MED ORDER — FUROSEMIDE 10 MG/ML IJ SOLN
40.0000 mg | Freq: Two times a day (BID) | INTRAMUSCULAR | Status: DC
  Administered 2017-02-10 – 2017-02-11 (×3): 40 mg via INTRAVENOUS
  Filled 2017-02-10 (×3): qty 4

## 2017-02-10 MED ORDER — POTASSIUM CHLORIDE CRYS ER 20 MEQ PO TBCR
40.0000 meq | EXTENDED_RELEASE_TABLET | Freq: Every day | ORAL | Status: DC
  Administered 2017-02-11 – 2017-02-16 (×6): 40 meq via ORAL
  Filled 2017-02-10 (×6): qty 2

## 2017-02-10 MED ORDER — POTASSIUM CHLORIDE CRYS ER 20 MEQ PO TBCR
60.0000 meq | EXTENDED_RELEASE_TABLET | ORAL | Status: AC
  Administered 2017-02-10: 60 meq via ORAL
  Filled 2017-02-10: qty 3

## 2017-02-10 MED ORDER — POTASSIUM CHLORIDE 20 MEQ PO PACK
40.0000 meq | PACK | Freq: Once | ORAL | Status: DC
  Filled 2017-02-10: qty 2

## 2017-02-10 NOTE — Progress Notes (Signed)
ANTICOAGULATION CONSULT NOTE - follow up  Pharmacy Consult for Heparin Indication: chest pain/ACS  Allergies  Allergen Reactions  . Penicillins     Has patient had a PCN reaction causing immediate rash, facial/tongue/throat swelling, SOB or lightheadedness with hypotension: Yes Has patient had a PCN reaction causing severe rash involving mucus membranes or skin necrosis: No Has patient had a PCN reaction that required hospitalization: No Has patient had a PCN reaction occurring within the last 10 years: No If all of the above answers are "NO", then may proceed with Cephalosporin use.   Patient Measurements: Height: 6' (182.9 cm) Weight: 203 lb 14.8 oz (92.5 kg) IBW/kg (Calculated) : 77.6 HEPARIN DW (KG): 93.4  Vital Signs: Temp: 98.1 F (36.7 C) (10/30 0800) Temp Source: Oral (10/30 0800) BP: 136/76 (10/30 0700) Pulse Rate: 81 (10/30 0800)  Labs:  Recent Labs  02/09/17 1028  02/09/17 1815 02/09/17 1954 02/10/17 0044 02/10/17 0616  HGB 12.0*  --   --   --   --  11.0*  HCT 37.3*  --   --   --   --  34.0*  PLT 216  --   --   --   --  208  LABPROT 14.6  --   --   --   --   --   INR 1.15  --   --   --   --   --   HEPARINUNFRC  --   --   --  0.23*  --  0.25*  CREATININE 0.92  --   --   --   --  0.90  TROPONINI 1.77*  < > 2.54*  --  2.26* 1.69*  < > = values in this interval not displayed.  Estimated Creatinine Clearance: 71.9 mL/min (by C-G formula based on SCr of 0.9 mg/dL).   Medical History: Past Medical History:  Diagnosis Date  . Hypertension   . Renal disorder    cyst on kidney   Medications:  See med rec  Assessment: 82 yo male present to ED with tachycardia. No chest pain, no N/V. He has Elevated troponins.  Possible NSTEMI vs elevated troponin related to the stress of the tachycardia.  Pt remains asymptomatic in the ED. Chadsvasc score =3.  Cardizem ordered for rate control.  MD asked Pharmacy to initiate heparin infusion.  Heparin level is below goal.    Goal of Therapy:  Heparin level 0.3-0.7 units/ml Monitor platelets by anticoagulation protocol: Yes   Plan:  Increase Heparin to 1500 units/hr Recheck heparin level in 6-8 hours Monitor labs, CBC, Heparin level  Hart Robinsons, PharmD Clinical Pharmacist Pager:  [phone] 02/10/2017   02/10/2017,11:11 AM

## 2017-02-10 NOTE — Progress Notes (Signed)
ANTICOAGULATION CONSULT NOTE - follow up  Pharmacy Consult for Heparin Indication: chest pain/ACS  Allergies  Allergen Reactions  . Penicillins     Has patient had a PCN reaction causing immediate rash, facial/tongue/throat swelling, SOB or lightheadedness with hypotension: Yes Has patient had a PCN reaction causing severe rash involving mucus membranes or skin necrosis: No Has patient had a PCN reaction that required hospitalization: No Has patient had a PCN reaction occurring within the last 10 years: No If all of the above answers are "NO", then may proceed with Cephalosporin use.   Patient Measurements: Height: 6' (182.9 cm) Weight: 203 lb 14.8 oz (92.5 kg) IBW/kg (Calculated) : 77.6 HEPARIN DW (KG): 93.4  Vital Signs: Temp: 98 F (36.7 C) (10/30 1200) Temp Source: Oral (10/30 1200) BP: 161/131 (10/30 1504) Pulse Rate: 98 (10/30 1504)  Labs:  Recent Labs  02/09/17 1028  02/09/17 1954 02/10/17 0044 02/10/17 0616 02/10/17 1218 02/10/17 1427  HGB 12.0*  --   --   --  11.0*  --   --   HCT 37.3*  --   --   --  34.0*  --   --   PLT 216  --   --   --  208  --   --   LABPROT 14.6  --   --   --   --   --   --   INR 1.15  --   --   --   --   --   --   HEPARINUNFRC  --   --  0.23*  --  0.25*  --  0.26*  CREATININE 0.92  --   --   --  0.90  --   --   TROPONINI 1.77*  < >  --  2.26* 1.69* 1.11*  --   < > = values in this interval not displayed.  Estimated Creatinine Clearance: 71.9 mL/min (by C-G formula based on SCr of 0.9 mg/dL).  Medical History: Past Medical History:  Diagnosis Date  . Hypertension   . Renal disorder    cyst on kidney   Medications:  See med rec  Assessment: 82 yo male present to ED with tachycardia. No chest pain, no N/V. He has Elevated troponins.  Possible NSTEMI vs elevated troponin related to the stress of the tachycardia.  Pt remains asymptomatic in the ED. Chadsvasc score =3.  Cardizem ordered for rate control. MD asked Pharmacy to  initiate heparin infusion.  Heparin level is below goal despite increase.  Goal of Therapy:  Heparin level 0.3-0.7 units/ml Monitor platelets by anticoagulation protocol: Yes   Plan:  Increase Heparin to 1700 units/hr Recheck heparin level in 6-8 hours Monitor labs, CBC, Heparin level daily  Hart Robinsons, PharmD Clinical Pharmacist Pager:  [phone] 02/10/2017

## 2017-02-10 NOTE — Progress Notes (Signed)
EKG done. Patient complained about having a very hard time breathing. Xopenex PRN breathing treatments ordered per RT protocol. RT will continue to monitor.

## 2017-02-10 NOTE — Progress Notes (Signed)
Patient's heart rate going from the 70s to 130s and staying for a couple minutes then going back to the 70s. Patient remains asymptomatic. Spoke with MD about turning cardizem back on. Cardizem started back at 2.5 per MD. Will continue to monitor closely.

## 2017-02-10 NOTE — Progress Notes (Signed)
*  PRELIMINARY RESULTS* Echocardiogram 2D Echocardiogram has been performed.  Michael Stephens 02/10/2017, 11:41 AM

## 2017-02-10 NOTE — Progress Notes (Signed)
Patient's HR went from 29s and 80s to 44. In to see patient who was alert and oriented. His only complaint was of indigestion. Cardizem drip was paused and MD notified. MD ordered an ECG and said to stop the cardizem. Patient's HR jumping from the 30s and 40s to the 90s. Cardizem has been stopped. Will continue to monitor closely.

## 2017-02-10 NOTE — Progress Notes (Signed)
**Note De-identified  Obfuscation** EKG complete; results reported to RN 

## 2017-02-10 NOTE — Plan of Care (Signed)
Problem: Safety: Goal: Ability to remain free from injury will improve Outcome: Progressing Patient encouraged to call before getting up, bed in lowest position, call bell and belongings within reach.

## 2017-02-10 NOTE — Progress Notes (Signed)
Progress Note  Patient Name: Chijioke Lasser Date of Encounter: 02/10/2017  Primary Cardiologist: Dorris Carnes, MD  Subjective   Feeling some better. Hungry. Complaints of heart burn overnight.   Inpatient Medications    Scheduled Meds: . aspirin EC  81 mg Oral Daily  . furosemide  40 mg Intravenous Daily  . hydrochlorothiazide  25 mg Oral Daily  . Influenza vac split quadrivalent PF  0.5 mL Intramuscular Tomorrow-1000  . metoprolol tartrate  25 mg Oral BID  . potassium chloride  40 mEq Oral Once   Continuous Infusions: . sodium chloride Stopped (02/09/17 1800)  . diltiazem (CARDIZEM) infusion 2.5 mg/hr (02/10/17 0405)  . heparin 1,500 Units/hr (02/10/17 0810)   PRN Meds: sodium chloride, acetaminophen **OR** acetaminophen, alum & mag hydroxide-simeth, levalbuterol, ondansetron **OR** ondansetron (ZOFRAN) IV, sodium chloride flush   Vital Signs    Vitals:   02/10/17 0500 02/10/17 0600 02/10/17 0700 02/10/17 0800  BP: 111/62 (!) 108/55 136/76   Pulse: 81 (!) 52 69 81  Resp: (!) 32 (!) 31 (!) 30 16  Temp:    98.1 F (36.7 C)  TempSrc:    Oral  SpO2: 93% (!) 89% 92% 94%  Weight: 203 lb 14.8 oz (92.5 kg)     Height:        Intake/Output Summary (Last 24 hours) at 02/10/17 0823 Last data filed at 02/10/17 0700  Gross per 24 hour  Intake          1422.63 ml  Output             1525 ml  Net          -102.37 ml   Filed Weights   02/09/17 1014 02/10/17 0500  Weight: 206 lb (93.4 kg) 203 lb 14.8 oz (92.5 kg)    Telemetry   Atrial flutter, atrial fib, with tachybrady, and pauses.   ECG    Atrial flutter, tachy/brady with pauses.   Physical Exam   GEN: No acute distress.   Neck: No JVD Cardiac: IRRR, no murmurs, rubs, or gallops.  Respiratory: Inspiratotory wheezes, no crackles or coughing.  GI: Soft, nontender, mild ly distended  MS: Non-pitting  edema; No deformity. Neuro:  Nonfocal Hard of hearing.  Psych: Normal affect   Labs    Chemistry  Recent  Labs Lab 02/09/17 1028 02/10/17 0616  NA 139 134*  K 3.5 3.4*  CL 99* 96*  CO2 31 30  GLUCOSE 151* 117*  BUN 35* 39*  CREATININE 0.92 0.90  CALCIUM 10.1 9.1  PROT  --  6.4*  ALBUMIN  --  3.7  AST  --  39  ALT  --  67*  ALKPHOS  --  53  BILITOT  --  1.4*  GFRNONAA >60 >60  GFRAA >60 >60  ANIONGAP 9 8     Hematology  Recent Labs Lab 02/09/17 1028 02/10/17 0616  WBC 9.5 9.5  RBC 3.84* 3.51*  HGB 12.0* 11.0*  HCT 37.3* 34.0*  MCV 97.1 96.9  MCH 31.3 31.3  MCHC 32.2 32.4  RDW 14.7 14.7  PLT 216 208    Cardiac Enzymes  Recent Labs Lab 02/09/17 1303 02/09/17 1815 02/10/17 0044 02/10/17 0616  TROPONINI 2.36* 2.54* 2.26* 1.69*    Radiology    Dg Chest 2 View  Result Date: 02/09/2017 CLINICAL DATA:  Shortness of breath. EXAM: CHEST  2 VIEW COMPARISON:  None. FINDINGS: Cardiomegaly. Increased pulmonary vascularity and interstitial markings. Trace bilateral pleural effusions. Low lung volumes with  bibasilar atelectasis. No pneumothorax. No acute osseous abnormality. IMPRESSION: Cardiomegaly with trace bilateral pleural effusions and pulmonary interstitial edema. Electronically Signed   By: Titus Dubin M.D.   On: 02/09/2017 12:29    Cardiac Studies   Echo is pending.   Patient Profile     82 y.o. male admitted from PCP office after coming in for flu shot and found to be tachycardic. EKG revealed atrial flutter. He is now on heparin for anticoagulation with CHADS VASC Score of 4. Found to have elevated troponin and pulmonary edema on CXR, given lasix in ER.elevated troponin  believed to be demand ischemia per Dr. Harrington Challenger. Other history includes HTN. Echo pending.   Assessment & Plan    1. New onset atrial flutter with RVR: Now with episodes of tachybrady, one pause noted on telemetry overnight.  Patient states that he has been having symptoms of "my heart flying" on and off with exertion for years.  Usually associated with heartburn-like symptoms.    The  patient had done some heavy yard work days ago, moving limbs and machinery at his home.  Felt his heart rate go up with associated shortness of breath.  He states that he rested and felt better.  He states that the heart rate began to go back up at rest while watching television on Saturday.  Patient states the heart rate elevation persisted on and off all weekend.  When he went to see his primary care physician for flu shot, he felt his heart racing and wanted him to check it before receiving the shot.  He is currently now on IV diltiazem drip which was turned off temporarily overnight due to to significant bradycardia with pauses heart rate less than 39 bpm.  He subsequently had elevated heart rate of 159 bpm and was restarted on diltiazem at 2.5 mg an hour.  Heart rate is labile rising during assessment of 240 bpm and settling down into the 80s.  The patient states last night when his heart rate was elevated he had severe heartburn.  He states he was given Mylanta and felt the gas feeling drop into his stomach and he was able to burp and pass gas and feel better.  Echocardiogram is pending to assist with medical management and further testing needed at this time.  Due to tachybradycardia noted on telemetry, reluctant to change him to p.o.Will discuss with Dr.Branch.  He is wheezing and therefore would not want to start amiodarone or beta blocker.  2.  Acute pulmonary edema: Bradycardia status has improved, urine output does not reflect doses of Lasix he received in the ER.  He continues to have evidence of fluid overload.  He is currently on Lasix 40 mg IV daily, will change to twice daily dosing.  Potassium reflects hypokalemia, and will therefore provide daily dosing with 60 mEq p.o. now and 40 mEq daily thereafter.  Magnesium 2.0.   3. Elevated troponin: Troponin elevation noted in the setting of rapid heart rhythm. Remains on heparin, started on ASA,   4. History of Hypertension: BP is currently  well controlled on diltiazem gtt. Review of home medications has him on losartan.HCTZ 100/12/5 mg daily.     Signed, Phill Myron. West Pugh, ANP, AACC   02/10/2017, 8:23 AM    Patient seen and discussed with DNP Purcell Nails, I agree with her documentation above. 82 yo male admitted with dyspnea, LE edema, and palpitations. Found to be in aflutter, a new diagnosis for patient. Currently on diltiazem IV  for rate control, started on heparin gtt for CHADS2Vasc score of at least 3 as well as elevated troponin. Trop peak 2.54 and trending down,poor quality EKG but possible antero/anterolateral ST/T changes. Echo is pending. Unclear if demand ischemia related to tachycardia or if some component of obstructive CAD, f/u echo results, consider noninvasive vs invasive ischemic testing pending results and aflutter rate control. He reports abdominal pain at times that improves with passing gas, no specific chest pains. Some evidence of fluid overload on admission. REported I/Os have him negative 172mL since admssion, he received lasix 60mg  IV x 1 yesterday, due to get 40mg  bid dosing today.   Review of telemetry shows episodes of aflutter, SR with PACs, as well as episodes of MAT. Occasional bradycardia, he had a 6 second pause after an episode of aflutter self terminated. We will f/u echo, pending results either start low dose oral dilt or lopressor. Continue heparin for now in case invasive procedures indicatied, in long run will need to be converted to oral anticoagulation.    Carlyle Dolly MD

## 2017-02-10 NOTE — Progress Notes (Signed)
PROGRESS NOTE    Michael Stephens  PJK:[id number] DOB: 06/25/1934 DOA: 02/09/2017 PCP: Deloria Lair., MD    Brief Narrative:  82 year old male who presents with palpitations, dyspnea on exertion and lower extremity edema for the last 72 hours, denied any angina or claudication. On initial physical examination blood pressure 178/98, heart rate 127, respiratory rate 33, oxygen saturation 100% on room air. Moist mucous membranes, moderate JVD, lungs with bilateral rails more prominent at bases, heart S1-S2 present, irregular no gallops or murmurs, abdomen protuberant, lower extremities with 2+ pitting edema bilaterally. Sodium 139, potassium 3.5, chloride 99, bicarbonate 31, glucose 151, BUN 35, creatinine 0.92, troponin 1.77, white count 9.5, hemoglobin 12.0, hematocrit 37.3, platelets 216, tSH 5.7, chest x-ray personally review with increase interstitial markings bilaterally, small bilateral effusions, cardiomegaly. EKG with atrial flutter with variable block.   Patient was admitted to the hospital with working diagnosis of atrial flutter with rapid ventricular response complicated with cardiogenic pulmonary edema/ acute diastolic heart failure.    Assessment & Plan:   Active Problems:   Atrial flutter ([place])   1. Atrial flutter with rapid ventricular response and elevated troponin. Patient has converted to sinus rhythm, will continue telemetry monitoring, continue metoprolol 25 mg po bid, and wean off IV diltiazem drip, target HR less than 130 bpm. Troponin peak at 2,5, patient described ongoing symptoms on exertion at home that have become more frequent and now at rest, suggesting angina equivalent/ unstable angina, likely patient will need ischemic work up, will follow with cardiology recommendations, for now will continue heparin drip.    2. Cardiogenic pulmonary edema due to acute diastolic heart failure. Continue diuresis with furosemide IV 40 mg to keep negative fluids balance, urine  output since admission 1,467 ml. Will replete K with po kcl, target K at 4 and mg at 2.   3. Hypertension. Continue hydrochlorothiazide 25 mg daily, will continue to hold on ARB for now.   4. Elevated TSH. Patient clinically euthyroid.    Subjective: Patient has converted to sinus rhythm, had episode of bradycardia on diltiazem infusion, moderate chest pian/ abdominal distention at night. Lately patient reports, symptoms with more than routine activities, like walking up hill, but over last 72 hours has been symptomatic at rest.   Objective: Vitals:   02/10/17 0300 02/10/17 0400 02/10/17 0500 02/10/17 0600  BP: 111/73 (!) 115/92 111/62 (!) 108/55  Pulse: (!) 42 82 81 (!) 52  Resp: (!) 30 (!) 25 (!) 32 (!) 31  Temp:  98.7 F (37.1 C)    TempSrc:  Oral    SpO2: 95% 97% 93% (!) 89%  Weight:   92.5 kg (203 lb 14.8 oz)   Height:        Intake/Output Summary (Last 24 hours) at 02/10/17 0732 Last data filed at 02/10/17 0500  Gross per 24 hour  Intake          1422.63 ml  Output             1325 ml  Net            97.63 ml   Filed Weights   02/09/17 1014 02/10/17 0500  Weight: 93.4 kg (206 lb) 92.5 kg (203 lb 14.8 oz)    Examination:   General: Not in pain or dyspnea, deconditioned Neurology: Awake and alert, non focal  E ENT: no pallor, no icterus, oral mucosa moist Cardiovascular: No JVD. S1-S2 present, irregular, no gallops, rubs, or murmurs. +/++ pitting lower extremity edema, at  the ankles. Pulmonary: decreased breath sounds bilaterally at bases, adequate air movement, no wheezing, rhonchi, but bibasilar rales. Gastrointestinal. Abdomen protuberant, no organomegaly, non tender, no rebound or guarding Skin. No rashes Musculoskeletal: no joint deformities     Data Reviewed: I have personally reviewed following labs and imaging studies  CBC:  Recent Labs Lab 02/09/17 1028 02/10/17 0616  WBC 9.5 9.5  HGB 12.0* 11.0*  HCT 37.3* 34.0*  MCV 97.1 96.9  PLT 216  157   Basic Metabolic Panel:  Recent Labs Lab 02/09/17 1028 02/10/17 0044 02/10/17 0616  NA 139  --  134*  K 3.5  --  3.4*  CL 99*  --  96*  CO2 31  --  30  GLUCOSE 151*  --  117*  BUN 35*  --  39*  CREATININE 0.92  --  0.90  CALCIUM 10.1  --  9.1  MG 1.9 2.0  --    GFR: Estimated Creatinine Clearance: 71.9 mL/min (by C-G formula based on SCr of 0.9 mg/dL). Liver Function Tests:  Recent Labs Lab 02/10/17 0616  AST 39  ALT 67*  ALKPHOS 53  BILITOT 1.4*  PROT 6.4*  ALBUMIN 3.7   No results for input(s): LIPASE, AMYLASE in the last 168 hours. No results for input(s): AMMONIA in the last 168 hours. Coagulation Profile:  Recent Labs Lab 02/09/17 1028  INR 1.15   Cardiac Enzymes:  Recent Labs Lab 02/09/17 1028 02/09/17 1303 02/09/17 1815 02/10/17 0044 02/10/17 0616  TROPONINI 1.77* 2.36* 2.54* 2.26* 1.69*   BNP (last 3 results) No results for input(s): PROBNP in the last 8760 hours. HbA1C: No results for input(s): HGBA1C in the last 72 hours. CBG: No results for input(s): GLUCAP in the last 168 hours. Lipid Profile:  Recent Labs  02/09/17 1303  CHOL 145  HDL 44  LDLCALC 90  TRIG 57  CHOLHDL 3.3   Thyroid Function Tests:  Recent Labs  02/09/17 1028  TSH 5.714*   Anemia Panel: No results for input(s): VITAMINB12, FOLATE, FERRITIN, TIBC, IRON, RETICCTPCT in the last 72 hours.    Radiology Studies: I have reviewed all of the imaging during this hospital visit personally     Scheduled Meds: . aspirin EC  81 mg Oral Daily  . hydrochlorothiazide  25 mg Oral Daily  . Influenza vac split quadrivalent PF  0.5 mL Intramuscular Tomorrow-1000  . metoprolol tartrate  25 mg Oral BID   Continuous Infusions: . sodium chloride Stopped (02/09/17 1800)  . diltiazem (CARDIZEM) infusion 2.5 mg/hr (02/10/17 0405)  . heparin 1,300 Units/hr (02/10/17 0529)     LOS: 1 day        Mauricio Gerome Apley, MD [facility] Pager  [phone]

## 2017-02-10 NOTE — Progress Notes (Signed)
Patient resting comfortably. HR remaining in the 60s. Will continue to monitor closely.

## 2017-02-11 ENCOUNTER — Encounter (HOSPITAL_COMMUNITY): Payer: Self-pay | Admitting: Internal Medicine

## 2017-02-11 DIAGNOSIS — I10 Essential (primary) hypertension: Secondary | ICD-10-CM | POA: Diagnosis present

## 2017-02-11 DIAGNOSIS — J9601 Acute respiratory failure with hypoxia: Secondary | ICD-10-CM

## 2017-02-11 DIAGNOSIS — I248 Other forms of acute ischemic heart disease: Secondary | ICD-10-CM

## 2017-02-11 DIAGNOSIS — I5031 Acute diastolic (congestive) heart failure: Secondary | ICD-10-CM

## 2017-02-11 HISTORY — DX: Acute diastolic (congestive) heart failure: I50.31

## 2017-02-11 LAB — BASIC METABOLIC PANEL
ANION GAP: 9 (ref 5–15)
BUN: 35 mg/dL — AB (ref 6–20)
CALCIUM: 8.9 mg/dL (ref 8.9–10.3)
CO2: 32 mmol/L (ref 22–32)
Chloride: 97 mmol/L — ABNORMAL LOW (ref 101–111)
Creatinine, Ser: 0.77 mg/dL (ref 0.61–1.24)
GFR calc Af Amer: >60 mL/min (ref >60)
GLUCOSE: 117 mg/dL — AB (ref 65–99)
Potassium: 3.2 mmol/L — ABNORMAL LOW (ref 3.5–5.1)
SODIUM: 138 mmol/L (ref 135–145)

## 2017-02-11 LAB — CBC
HCT: 33.3 % — ABNORMAL LOW (ref 39.0–52.0)
Hemoglobin: 10.9 g/dL — ABNORMAL LOW (ref 13.0–17.0)
MCH: 31.1 pg (ref 26.0–34.0)
MCHC: 32.7 g/dL (ref 30.0–36.0)
MCV: 95.1 fL (ref 78.0–100.0)
PLATELETS: 222 10*3/uL (ref 150–400)
RBC: 3.5 MIL/uL — ABNORMAL LOW (ref 4.22–5.81)
RDW: 14.6 % (ref 11.5–15.5)
WBC: 9.3 10*3/uL (ref 4.0–10.5)

## 2017-02-11 LAB — HEPARIN LEVEL (UNFRACTIONATED): Heparin Unfractionated: 0.46 IU/mL (ref 0.30–0.70)

## 2017-02-11 MED ORDER — METOPROLOL TARTRATE 25 MG PO TABS
25.0000 mg | ORAL_TABLET | Freq: Three times a day (TID) | ORAL | Status: DC
  Administered 2017-02-11 – 2017-02-12 (×4): 25 mg via ORAL
  Filled 2017-02-11 (×4): qty 1

## 2017-02-11 MED ORDER — FUROSEMIDE 10 MG/ML IJ SOLN
60.0000 mg | Freq: Two times a day (BID) | INTRAMUSCULAR | Status: DC
  Administered 2017-02-11 – 2017-02-16 (×10): 60 mg via INTRAVENOUS
  Filled 2017-02-11 (×10): qty 6

## 2017-02-11 MED ORDER — FUROSEMIDE 10 MG/ML IJ SOLN
20.0000 mg | Freq: Once | INTRAMUSCULAR | Status: AC
  Administered 2017-02-11: 20 mg via INTRAVENOUS
  Filled 2017-02-11: qty 2

## 2017-02-11 NOTE — Care Management Note (Signed)
Case Management Note  Patient Details  Name: Michael Stephens MRN: [id number] Date of Birth: 1935-03-10  Subjective/Objective:     Adm from with Atrial flutter and CHF exacerbation. From home with wife, very independent. No HH or DME pta. Currently on oxygen. Will most likely need NOAC prior to DC, currently still on drip.               Action/Plan: CM following for needs. Anticipate Weaning to room air. ? Anticoagulation med at DC.    Expected Discharge Date:      02/14/2017            Expected Discharge Plan:  Home/Self Care  In-House Referral:     Discharge planning Services  CM Consult, Medication Assistance  Post Acute Care Choice:    Choice offered to:     DME Arranged:    DME Agency:     HH Arranged:    HH Agency:     Status of Service:  In process, will continue to follow  If discussed at Long Length of Stay Meetings, dates discussed:    Additional Comments:  Jontavious Commons, Chauncey Reading, RN 02/11/2017, 11:39 AM

## 2017-02-11 NOTE — Progress Notes (Signed)
Progress Note  Patient Name: Michael Stephens Date of Encounter: 02/11/2017   Subjective   SOB improving though not resolved.   Inpatient Medications    Scheduled Meds: . aspirin EC  81 mg Oral Daily  . furosemide  40 mg Intravenous Q12H  . hydrochlorothiazide  25 mg Oral Daily  . metoprolol tartrate  25 mg Oral BID  . potassium chloride  40 mEq Oral Daily   Continuous Infusions: . sodium chloride Stopped (02/09/17 1800)  . diltiazem (CARDIZEM) infusion 2.5 mg/hr (02/11/17 0700)  . heparin 1,700 Units/hr (02/11/17 0800)   PRN Meds: sodium chloride, acetaminophen **OR** acetaminophen, alum & mag hydroxide-simeth, levalbuterol, ondansetron **OR** ondansetron (ZOFRAN) IV, sodium chloride flush   Vital Signs    Vitals:   02/11/17 0600 02/11/17 0630 02/11/17 0700 02/11/17 0800  BP: (!) 138/112 140/83 124/82 132/73  Pulse: 73 (!) 55 (!) 36 83  Resp: 14 (!) 26 (!) 24   Temp: 98.5 F (36.9 C)   97.9 F (36.6 C)  TempSrc: Oral   Axillary  SpO2: 98% 93% 96% 90%  Weight: 201 lb 4.5 oz (91.3 kg)     Height:        Intake/Output Summary (Last 24 hours) at 02/11/17 0842 Last data filed at 02/11/17 0800  Gross per 24 hour  Intake            432.2 ml  Output             2200 ml  Net          -1767.8 ml   Filed Weights   02/09/17 1014 02/10/17 0500 02/11/17 0600  Weight: 206 lb (93.4 kg) 203 lb 14.8 oz (92.5 kg) 201 lb 4.5 oz (91.3 kg)    Telemetry    SR, aflutter with RVR. MAT  ECG    n/a  Physical Exam   GEN: No acute distress.   Neck: +elevated JVD Cardiac: irreg, no murmurs, rubs, or gallops.  Respiratory: Clear to auscultation bilaterally. GI: Soft, nontender, non-distended  MS: No edema; No deformity. Neuro:  Nonfocal  Psych: Normal affect   Labs    Chemistry Recent Labs Lab 02/09/17 1028 02/10/17 0616 02/11/17 0538  NA 139 134* 138  K 3.5 3.4* 3.2*  CL 99* 96* 97*  CO2 31 30 32  GLUCOSE 151* 117* 117*  BUN 35* 39* 35*  CREATININE 0.92  0.90 0.77  CALCIUM 10.1 9.1 8.9  PROT  --  6.4*  --   ALBUMIN  --  3.7  --   AST  --  39  --   ALT  --  67*  --   ALKPHOS  --  53  --   BILITOT  --  1.4*  --   GFRNONAA >60 >60 >60  GFRAA >60 >60 >60  ANIONGAP 9 8 9      Hematology Recent Labs Lab 02/09/17 1028 02/10/17 0616 02/11/17 0538  WBC 9.5 9.5 9.3  RBC 3.84* 3.51* 3.50*  HGB 12.0* 11.0* 10.9*  HCT 37.3* 34.0* 33.3*  MCV 97.1 96.9 95.1  MCH 31.3 31.3 31.1  MCHC 32.2 32.4 32.7  RDW 14.7 14.7 14.6  PLT 216 208 222    Cardiac Enzymes Recent Labs Lab 02/09/17 1815 02/10/17 0044 02/10/17 0616 02/10/17 1218  TROPONINI 2.54* 2.26* 1.69* 1.11*   No results for input(s): TROPIPOC in the last 168 hours.   BNP Recent Labs Lab 02/10/17 0619  BNP 212.0*     DDimer No results for input(s):  DDIMER in the last 168 hours.   Radiology    Dg Chest 2 View  Result Date: 02/09/2017 CLINICAL DATA:  Shortness of breath. EXAM: CHEST  2 VIEW COMPARISON:  None. FINDINGS: Cardiomegaly. Increased pulmonary vascularity and interstitial markings. Trace bilateral pleural effusions. Low lung volumes with bibasilar atelectasis. No pneumothorax. No acute osseous abnormality. IMPRESSION: Cardiomegaly with trace bilateral pleural effusions and pulmonary interstitial edema. Electronically Signed   By: Titus Dubin M.D.   On: 02/09/2017 12:29    Cardiac Studies    Patient Profile     82 y.o. male admitted from PCP office after coming in for flu shot and found to be tachycardic. EKG revealed atrial flutter. He is now on heparin for anticoagulation with CHADS VASC Score of 4. Found to have elevated troponin and pulmonary edema on CXR, given lasix in ER.elevated troponin  believed to be demand ischemia per Dr. Harrington Challenger. Other history includes HTN.   Assessment & Plan    1. Atrial arrhythmias - both aflutter and MAT noted this admission - initial rate control with IV dilt, did not tolerate higher doses due to bradycardia. Started on  oral lopressor yesterday.  -CHADSVasc score of at least 3. Currently on heparin in case invasive procedures indicated, will need to transition to oral anticoagulation once that's decided  2. Elevated troponin - peak troponin 2.5 trending down, in setting of severe tachycardia and heart failure - I suspect likely demand ischemia in setting of possible chronic obstructive CAD as opposed to acute obstructive CAD - echo shows normal LVEF, no WMAs - once heart rates and volume status improved, will need ischemic testing, either noninvasive vs invasive.   3. Acute diastolic HF - restrictive diastolic function by echo, LVEF 50-55%, dilated IVC - likely exacerbated by atrial arrhythmias - he is on lasix 40mg  IV bid, negative 1.5 liters yesterday, negative 1.5 liters since admission. Downtrend in Cr and BUN consistent with venous congestion and CHF. Remains volume overloaded, increase lasix to 60mg  bid.     For questions or updates, please contact [place] Please consult [email or web address] for contact info under Cardiology/STEMI.      Merrily Pew, MD  02/11/2017, 8:42 AM

## 2017-02-11 NOTE — Progress Notes (Signed)
PROGRESS NOTE    Michael Stephens  EGB:[id number] DOB: 09-14-1934 DOA: 02/09/2017 PCP: Deloria Lair., MD    Brief Narrative:  82 year old male who presented to the hospital with palpitations and dyspnea on exertion.  He was found to be in decompensated CHF in rapid atrial flutter.  He was started on intravenous diltiazem and intravenous diuresis.  Cardiology has been following.   Assessment & Plan:   Active Problems:   Atrial flutter (HCC)   Essential hypertension   Demand ischemia (HCC)   Acute diastolic CHF (congestive heart failure) (HCC)   Acute respiratory failure with hypoxia (HCC)   1. Atrial flutter with rapid ventricular response.  Currently on oral metoprolol and intravenous diltiazem.  Chadsvasc of at least 3.  Currently on heparin infusion.  Will need to transition to oral anticoagulation once decision on invasive versus noninvasive ischemic workup has been determined.  Trying to wean off Cardizem infusion as tolerated. 2. Demand ischemia.  Found to have elevated troponin of 2.5, trending down.  Felt to be demand ischemia in the setting of chronic obstructive coronary disease.  No wall motion abnormalities were noted on echocardiogram.  Further workup to be determined by cardiology. 3. Acute diastolic congestive heart failure.  Currently on IV Lasix.  Had approximately 1.9 L of urine out yesterday.  Overall volume status -1.5 L since admission.  Lasix dosing has been increased.  Continue to monitor intake and output. 4. Acute respiratory failure with hypoxia.  Related to pulmonary edema from heart failure.  Monitor on increased dose of Lasix.  Oxygen requirement has increased over the last several days. Repeat chest x-ray in the morning.  Try to wean off oxygen as tolerated. 5. Hypertension.  Blood pressures currently stable.  Continue on Lopressor   DVT prophylaxis: Heparin infusion Code Status: Full code Family Communication: Discussed with wife at the  bedside Disposition Plan: Discharge home once improved   Consultants:   Cardiology  Procedures:  Echo:- Left ventricle: The cavity size was normal. Wall thickness was   increased in a pattern of mild LVH. Systolic function was normal.   The estimated ejection fraction was in the range of 50% to 55%.   Doppler parameters are consistent with restrictive physiology,   indicative of decreased left ventricular diastolic compliance   and/or increased left atrial pressure. Doppler parameters are   consistent with high ventricular filling pressure. - Aortic valve: Mildly calcified annulus. Trileaflet; mildly   thickened leaflets. There was very mild stenosis. Valve area   (VTI): 1.59 cm^2. Valve area (Vmax): 1.73 cm^2. - Left atrium: The atrium was severely dilated. - Right atrium: The atrium was mildly dilated. - Atrial septum: No defect or patent foramen ovale was identified. - Inferior vena cava: The vessel was dilated. The respirophasic   diameter changes were blunted (< 50%), consistent with elevated   central venous pressure.  - Technically difficult study.  Antimicrobials:      Subjective: Still has some shortness of breath on exertion while using the bathroom.  Had some twinges of chest pain this morning.  Objective: Vitals:   02/11/17 0630 02/11/17 0700 02/11/17 0800 02/11/17 0900  BP: 140/83 124/82 132/73 (!) 143/89  Pulse: (!) 55 (!) 36 83 (!) 142  Resp: (!) 26 (!) 24  (!) 30  Temp:   97.9 F (36.6 C)   TempSrc:   Axillary   SpO2: 93% 96% 90% 96%  Weight:      Height:  Intake/Output Summary (Last 24 hours) at 02/11/17 1110 Last data filed at 02/11/17 1012  Gross per 24 hour  Intake           395.03 ml  Output             2700 ml  Net         -2304.97 ml   Filed Weights   02/09/17 1014 02/10/17 0500 02/11/17 0600  Weight: 93.4 kg (206 lb) 92.5 kg (203 lb 14.8 oz) 91.3 kg (201 lb 4.5 oz)    Examination:  General exam: Appears calm and comfortable   Respiratory system: crackles at bases. Respiratory effort normal. Cardiovascular system: S1 & S2 heard, RRR. No JVD, murmurs, rubs, gallops or clicks. 1+ pedal edema. Gastrointestinal system: Abdomen is nondistended, soft and nontender. No organomegaly or masses felt. Normal bowel sounds heard. Central nervous system: Alert and oriented. No focal neurological deficits. Extremities: Symmetric 5 x 5 power. Skin: No rashes, lesions or ulcers Psychiatry: Judgement and insight appear normal. Mood & affect appropriate.     Data Reviewed: I have personally reviewed following labs and imaging studies  CBC:  Recent Labs Lab 02/09/17 1028 02/10/17 0616 02/11/17 0538  WBC 9.5 9.5 9.3  HGB 12.0* 11.0* 10.9*  HCT 37.3* 34.0* 33.3*  MCV 97.1 96.9 95.1  PLT 216 208 488   Basic Metabolic Panel:  Recent Labs Lab 02/09/17 1028 02/10/17 0044 02/10/17 0616 02/11/17 0538  NA 139  --  134* 138  K 3.5  --  3.4* 3.2*  CL 99*  --  96* 97*  CO2 31  --  30 32  GLUCOSE 151*  --  117* 117*  BUN 35*  --  39* 35*  CREATININE 0.92  --  0.90 0.77  CALCIUM 10.1  --  9.1 8.9  MG 1.9 2.0  --   --    GFR: Estimated Creatinine Clearance: 80.8 mL/min (by C-G formula based on SCr of 0.77 mg/dL). Liver Function Tests:  Recent Labs Lab 02/10/17 0616  AST 39  ALT 67*  ALKPHOS 53  BILITOT 1.4*  PROT 6.4*  ALBUMIN 3.7   No results for input(s): LIPASE, AMYLASE in the last 168 hours. No results for input(s): AMMONIA in the last 168 hours. Coagulation Profile:  Recent Labs Lab 02/09/17 1028  INR 1.15   Cardiac Enzymes:  Recent Labs Lab 02/09/17 1303 02/09/17 1815 02/10/17 0044 02/10/17 0616 02/10/17 1218  TROPONINI 2.36* 2.54* 2.26* 1.69* 1.11*   BNP (last 3 results) No results for input(s): PROBNP in the last 8760 hours. HbA1C: No results for input(s): HGBA1C in the last 72 hours. CBG: No results for input(s): GLUCAP in the last 168 hours. Lipid Profile:  Recent Labs   02/09/17 1303  CHOL 145  HDL 44  LDLCALC 90  TRIG 57  CHOLHDL 3.3   Thyroid Function Tests:  Recent Labs  02/09/17 1028  TSH 5.714*   Anemia Panel: No results for input(s): VITAMINB12, FOLATE, FERRITIN, TIBC, IRON, RETICCTPCT in the last 72 hours. Sepsis Labs: No results for input(s): PROCALCITON, LATICACIDVEN in the last 168 hours.  Recent Results (from the past 240 hour(s))  MRSA PCR Screening     Status: None   Collection Time: 02/09/17  6:23 PM  Result Value Ref Range Status   MRSA by PCR NEGATIVE NEGATIVE Final    Comment:        The GeneXpert MRSA Assay (FDA approved for NASAL specimens only), is one component of a comprehensive MRSA  colonization surveillance program. It is not intended to diagnose MRSA infection nor to guide or monitor treatment for MRSA infections.          Radiology Studies: Dg Chest 2 View  Result Date: 02/09/2017 CLINICAL DATA:  Shortness of breath. EXAM: CHEST  2 VIEW COMPARISON:  None. FINDINGS: Cardiomegaly. Increased pulmonary vascularity and interstitial markings. Trace bilateral pleural effusions. Low lung volumes with bibasilar atelectasis. No pneumothorax. No acute osseous abnormality. IMPRESSION: Cardiomegaly with trace bilateral pleural effusions and pulmonary interstitial edema. Electronically Signed   By: Titus Dubin M.D.   On: 02/09/2017 12:29        Scheduled Meds: . aspirin EC  81 mg Oral Daily  . furosemide  60 mg Intravenous BID  . hydrochlorothiazide  25 mg Oral Daily  . metoprolol tartrate  25 mg Oral TID  . potassium chloride  40 mEq Oral Daily   Continuous Infusions: . sodium chloride Stopped (02/09/17 1800)  . diltiazem (CARDIZEM) infusion 2.5 mg/hr (02/11/17 1002)  . heparin 1,700 Units/hr (02/11/17 0800)     LOS: 2 days    Time spent: 46mins    Juanmiguel Defelice, MD [facility] Pager [phone]  If 7PM-7AM, please contact night-coverage [email or web address] Password [email or web address] 02/11/2017,  11:10 AM

## 2017-02-11 NOTE — Progress Notes (Signed)
ANTICOAGULATION CONSULT NOTE - follow up  Pharmacy Consult for Heparin Indication: chest pain/ACS  Allergies  Allergen Reactions  . Penicillins     Has patient had a PCN reaction causing immediate rash, facial/tongue/throat swelling, SOB or lightheadedness with hypotension: Yes Has patient had a PCN reaction causing severe rash involving mucus membranes or skin necrosis: No Has patient had a PCN reaction that required hospitalization: No Has patient had a PCN reaction occurring within the last 10 years: No If all of the above answers are "NO", then may proceed with Cephalosporin use.   Patient Measurements: Height: 6' (182.9 cm) Weight: 201 lb 4.5 oz (91.3 kg) IBW/kg (Calculated) : 77.6 HEPARIN DW (KG): 93.4  Vital Signs: Temp: 97.9 F (36.6 C) (10/31 0800) Temp Source: Axillary (10/31 0800) BP: 132/73 (10/31 0800) Pulse Rate: 83 (10/31 0800)  Labs:  Recent Labs  02/09/17 1028  02/10/17 0044 02/10/17 0616 02/10/17 1218 02/10/17 1427 02/11/17 0538  HGB 12.0*  --   --  11.0*  --   --  10.9*  HCT 37.3*  --   --  34.0*  --   --  33.3*  PLT 216  --   --  208  --   --  222  LABPROT 14.6  --   --   --   --   --   --   INR 1.15  --   --   --   --   --   --   HEPARINUNFRC  --   < >  --  0.25*  --  0.26* 0.46  CREATININE 0.92  --   --  0.90  --   --  0.77  TROPONINI 1.77*  < > 2.26* 1.69* 1.11*  --   --   < > = values in this interval not displayed.  Estimated Creatinine Clearance: 80.8 mL/min (by C-G formula based on SCr of 0.77 mg/dL).   Medical History: Past Medical History:  Diagnosis Date  . Hypertension   . Renal disorder    cyst on kidney   Medications:  Medications Prior to Admission  Medication Sig Dispense Refill  . aspirin 325 MG tablet Take 325 mg by mouth every 4 (four) hours as needed for mild pain, moderate pain or headache.    . Calcium Carbonate Antacid (ALKA-SELTZER ANTACID PO) Take 1 Dose by mouth daily as needed (indegestion).    Marland Kitchen  losartan-hydrochlorothiazide (HYZAAR) 100-12.5 MG tablet Take 1 tablet by mouth daily.     Assessment: 82 yo male present to ED with tachycardia. No chest pain, no N/V. He had Elevated troponins.  Possible NSTEMI vs elevated troponin related to the stress of the tachycardia. Chadsvasc score =3.  Cardizem ordered for rate control. Heparin level @ goal. No bleeding noted, CBC stable.  Goal of Therapy:  Heparin level 0.3-0.7 units/ml Monitor platelets by anticoagulation protocol: Yes   Plan:  Continue Heparin @ 1700 units/hr Daily heparin level  Monitor labs, CBC, Heparin level  Pricilla Larsson, [facility]  02/11/2017 8:53 AM

## 2017-02-12 ENCOUNTER — Inpatient Hospital Stay (HOSPITAL_COMMUNITY): Payer: Medicare Other

## 2017-02-12 DIAGNOSIS — I5031 Acute diastolic (congestive) heart failure: Secondary | ICD-10-CM

## 2017-02-12 LAB — BASIC METABOLIC PANEL
Anion gap: 8 (ref 5–15)
BUN: 35 mg/dL — AB (ref 6–20)
CHLORIDE: 93 mmol/L — AB (ref 101–111)
CO2: 35 mmol/L — ABNORMAL HIGH (ref 22–32)
Calcium: 8.9 mg/dL (ref 8.9–10.3)
Creatinine, Ser: 0.82 mg/dL (ref 0.61–1.24)
GFR calc Af Amer: >60 mL/min (ref >60)
GFR calc non Af Amer: >60 mL/min (ref >60)
GLUCOSE: 98 mg/dL (ref 65–99)
POTASSIUM: 3.5 mmol/L (ref 3.5–5.1)
Sodium: 136 mmol/L (ref 135–145)

## 2017-02-12 LAB — HEPARIN LEVEL (UNFRACTIONATED): Heparin Unfractionated: 0.57 IU/mL (ref 0.30–0.70)

## 2017-02-12 LAB — CBC
HCT: 34.9 % — ABNORMAL LOW (ref 39.0–52.0)
HEMOGLOBIN: 11.2 g/dL — AB (ref 13.0–17.0)
MCH: 31.2 pg (ref 26.0–34.0)
MCHC: 32.1 g/dL (ref 30.0–36.0)
MCV: 97.2 fL (ref 78.0–100.0)
PLATELETS: 242 10*3/uL (ref 150–400)
RBC: 3.59 MIL/uL — AB (ref 4.22–5.81)
RDW: 14.5 % (ref 11.5–15.5)
WBC: 8.1 10*3/uL (ref 4.0–10.5)

## 2017-02-12 MED ORDER — METOPROLOL TARTRATE 50 MG PO TABS
50.0000 mg | ORAL_TABLET | Freq: Two times a day (BID) | ORAL | Status: DC
  Administered 2017-02-12 – 2017-02-13 (×2): 50 mg via ORAL
  Filled 2017-02-12 (×2): qty 1

## 2017-02-12 MED ORDER — SALINE SPRAY 0.65 % NA SOLN
1.0000 | NASAL | Status: DC | PRN
  Filled 2017-02-12: qty 44

## 2017-02-12 MED ORDER — METOPROLOL TARTRATE 25 MG PO TABS
25.0000 mg | ORAL_TABLET | Freq: Once | ORAL | Status: AC
  Administered 2017-02-12: 25 mg via ORAL
  Filled 2017-02-12: qty 1

## 2017-02-12 MED ORDER — OFF THE BEAT BOOK
Freq: Once | Status: DC
  Filled 2017-02-12: qty 1

## 2017-02-12 NOTE — Consult Note (Signed)
Progress Note  Patient Name: Michael Stephens Date of Encounter: 02/12/2017  Primary Cardiologist: Dorris Carnes MD  Subjective   Complaints of sinus congestion and blood clots in his nose last night. Also some abdominal discomfort. He is overall breathing better. No complaints of heart burn or heart racing.   Inpatient Medications    Scheduled Meds: . aspirin EC  81 mg Oral Daily  . furosemide  60 mg Intravenous BID  . hydrochlorothiazide  25 mg Oral Daily  . metoprolol tartrate  25 mg Oral TID  . off the beat book   Does not apply Once  . potassium chloride  40 mEq Oral Daily   Continuous Infusions: . sodium chloride Stopped (02/09/17 1800)  . diltiazem (CARDIZEM) infusion Stopped (02/11/17 1600)  . heparin 1,700 Units/hr (02/11/17 1521)   PRN Meds: sodium chloride, acetaminophen **OR** acetaminophen, alum & mag hydroxide-simeth, levalbuterol, ondansetron **OR** ondansetron (ZOFRAN) IV, sodium chloride, sodium chloride flush   Vital Signs    Vitals:   02/12/17 0300 02/12/17 0400 02/12/17 0500 02/12/17 0733  BP: (!) 148/74 (!) 140/105 (!) 116/54   Pulse: (!) 33 (!) 43 (!) 37   Resp: (!) 21 18 (!) 25   Temp:  98.1 F (36.7 C)  97.9 F (36.6 C)  TempSrc:  Oral  Oral  SpO2: 94% 94% 99%   Weight:   196 lb 6.9 oz (89.1 kg)   Height:        Intake/Output Summary (Last 24 hours) at 02/12/17 0740 Last data filed at 02/12/17 0500  Gross per 24 hour  Intake           1834.5 ml  Output             2850 ml  Net          -1015.5 ml   Filed Weights   02/10/17 0500 02/11/17 0600 02/12/17 0500  Weight: 203 lb 14.8 oz (92.5 kg) 201 lb 4.5 oz (91.3 kg) 196 lb 6.9 oz (89.1 kg)    Telemetry     Atrial fib with Bigeminy. Rates in the 80's and 90's, no pauses.  Personally Reviewed  ECG    Atrial fib with PVC's non-specific inferior ST changes  - Personally Reviewed  Physical Exam   GEN: No acute distress.   Neck: No JVD Cardiac: IRRR, soft systolic murmurs, rubs, or  gallops.  Respiratory: Clear to auscultation bilaterally. GI: Soft, nontender, non-distended  MS: No edema; No deformity. Neuro:  Nonfocal  Psych: Normal affect   Labs    Chemistry Recent Labs Lab 02/10/17 0616 02/11/17 0538 02/12/17 0415  NA 134* 138 136  K 3.4* 3.2* 3.5  CL 96* 97* 93*  CO2 30 32 35*  GLUCOSE 117* 117* 98  BUN 39* 35* 35*  CREATININE 0.90 0.77 0.82  CALCIUM 9.1 8.9 8.9  PROT 6.4*  --   --   ALBUMIN 3.7  --   --   AST 39  --   --   ALT 67*  --   --   ALKPHOS 53  --   --   BILITOT 1.4*  --   --   GFRNONAA >60 >60 >60  GFRAA >60 >60 >60  ANIONGAP 8 9 8      Hematology Recent Labs Lab 02/10/17 0616 02/11/17 0538 02/12/17 0415  WBC 9.5 9.3 8.1  RBC 3.51* 3.50* 3.59*  HGB 11.0* 10.9* 11.2*  HCT 34.0* 33.3* 34.9*  MCV 96.9 95.1 97.2  MCH 31.3 31.1 31.2  MCHC 32.4 32.7 32.1  RDW 14.7 14.6 14.5  PLT 208 222 242    Cardiac Enzymes Recent Labs Lab 02/09/17 1815 02/10/17 0044 02/10/17 0616 02/10/17 1218  TROPONINI 2.54* 2.26* 1.69* 1.11*   No results for input(s): TROPIPOC in the last 168 hours.   BNP Recent Labs Lab 02/10/17 0619  BNP 212.0*     DDimer No results for input(s): DDIMER in the last 168 hours.    Cardiac Studies  Echocardiogram 02/10/2017 Left ventricle: The cavity size was normal. Wall thickness was   increased in a pattern of mild LVH. Systolic function was normal.   The estimated ejection fraction was in the range of 50% to 55%.   Doppler parameters are consistent with restrictive physiology,   indicative of decreased left ventricular diastolic compliance   and/or increased left atrial pressure. Doppler parameters are   consistent with high ventricular filling pressure. - Aortic valve: Mildly calcified annulus. Trileaflet; mildly   thickened leaflets. There was very mild stenosis. Valve area   (VTI): 1.59 cm^2. Valve area (Vmax): 1.73 cm^2. - Left atrium: The atrium was severely dilated. - Right atrium: The  atrium was mildly dilated. - Atrial septum: No defect or patent foramen ovale was identified. - Inferior vena cava: The vessel was dilated. The respirophasic   diameter changes were blunted (< 50%), consistent with elevated   central venous pressure. - Technically difficult study.  Patient Profile     82 y.o. male admitted from PCP office after coming in for flu shot and found to be tachycardic. EKG revealed atrial flutter. He is now on heparin for anticoagulation with CHADS VASC Score of 4. Found to have elevated troponin and pulmonary edema on CXR, given lasix in ER.elevated troponin believed to be demand ischemia per Dr. Harrington Challenger. Other history includes HTN.   Assessment & Plan    1. Atrial Arrhythmia's: Atrial flutter and MAT initially on admission. On po lopressor 25 mg TID and IV diltiazem. He remains on heparin gtt in case invasive procedure needs to be planned. CHADS VASC Score 3. He is feeling better. I will get him out of bed today.   2. Elevated Troponin: Treading down with peak at 2.5, not clear if demand ischemia vs CAD. LVEF was found to be normal, there were no wall motion abnormalities. Restrictive diastolic function. Discuss with Dr. Harl Bowie need to have ischemic testing now that he is more stable.   3. Acute Diastolic CHF: He has diuresed 2.5 liters since admission with symptomatic improvement of edema and breathing status. Lasix was increased to 60 mg BID yesterday. Creatinine 0.82 with potassium 3.5. CO2 35.   Signed, Phill Myron. [place], ANP, AACC   02/12/2017, 7:40 AM    Attending note Patient seen and discussed with DNP Purcell Nails, I agree with her docuementation above. Admitted with new diagnosis of atrial arrhythmias including aflutter with RVR and MAT, acute diastolic HF, and elevated troponin. Iniital rate control with dilt drip, started on lopressor with increase to 25mg  tid yesterday, now off dilt drip with good rate control, tele shows SR with PACs. We have  continued heparin drip incase invasive procedures become neccesary, will transition to oral anticoagulation once this has been decided. Regarding his acute diastolic HF, negative 1 liter yesterday, negative 2.5 liters since admission. He is on lasix IV 60mg  bid (increased yesterday afternoon, received 40mg  in AM and 60mg  in PM). Renal function remains stable. Continue IV diuretics today. Regarding his elevated troponin, possible demand ischemia in the  setting of aflutter with RVR, ACS less likely. We will plan for a nuclear stress tomorrow, will make npo tonight. Ok for transfer to medicine bed with telemetry, would ask to have nurses get patient up and ambulating.   Carlyle Dolly MD  Carlyle Dolly MD

## 2017-02-12 NOTE — Progress Notes (Signed)
ANTICOAGULATION CONSULT NOTE - follow up  Pharmacy Consult for Heparin Indication: chest pain/ACS  Allergies  Allergen Reactions  . Penicillins     Has patient had a PCN reaction causing immediate rash, facial/tongue/throat swelling, SOB or lightheadedness with hypotension: Yes Has patient had a PCN reaction causing severe rash involving mucus membranes or skin necrosis: No Has patient had a PCN reaction that required hospitalization: No Has patient had a PCN reaction occurring within the last 10 years: No If all of the above answers are "NO", then may proceed with Cephalosporin use.   Patient Measurements: Height: 6' (182.9 cm) Weight: 196 lb 6.9 oz (89.1 kg) IBW/kg (Calculated) : 77.6 HEPARIN DW (KG): 93.4  Vital Signs: Temp: 97.9 F (36.6 C) (11/01 0733) Temp Source: Oral (11/01 0733) BP: 116/54 (11/01 0500) Pulse Rate: 37 (11/01 0500)  Labs:  Recent Labs  02/09/17 1028  02/10/17 0044 02/10/17 0616 02/10/17 1218 02/10/17 1427 02/11/17 0538 02/12/17 0415  HGB 12.0*  --   --  11.0*  --   --  10.9* 11.2*  HCT 37.3*  --   --  34.0*  --   --  33.3* 34.9*  PLT 216  --   --  208  --   --  222 242  LABPROT 14.6  --   --   --   --   --   --   --   INR 1.15  --   --   --   --   --   --   --   HEPARINUNFRC  --   < >  --  0.25*  --  0.26* 0.46 0.57  CREATININE 0.92  --   --  0.90  --   --  0.77 0.82  TROPONINI 1.77*  < > 2.26* 1.69* 1.11*  --   --   --   < > = values in this interval not displayed.  Estimated Creatinine Clearance: 78.9 mL/min (by C-G formula based on SCr of 0.82 mg/dL).   Medical History: Past Medical History:  Diagnosis Date  . Acute diastolic CHF (congestive heart failure) ([place]) 02/11/2017  . Hypertension   . Renal disorder    cyst on kidney   Medications:  Medications Prior to Admission  Medication Sig Dispense Refill  . aspirin 325 MG tablet Take 325 mg by mouth every 4 (four) hours as needed for mild pain, moderate pain or headache.    .  Calcium Carbonate Antacid (ALKA-SELTZER ANTACID PO) Take 1 Dose by mouth daily as needed (indegestion).    Marland Kitchen losartan-hydrochlorothiazide (HYZAAR) 100-12.5 MG tablet Take 1 tablet by mouth daily.     Assessment: 82 yo male present to ED with tachycardia. No chest pain, no N/V. He had Elevated troponins.  Possible NSTEMI vs elevated troponin related to the stress of the tachycardia. Chadsvasc score =3.  Cardizem ordered for rate control. Heparin level @ goal. No bleeding noted, CBC stable.  CHADSVasc score of at least 3. Currently on heparin in case invasive procedures indicated, will need to transition to oral anticoagulation once that's decided  Goal of Therapy:  Heparin level 0.3-0.7 units/ml Monitor platelets by anticoagulation protocol: Yes   Plan:  Continue Heparin @ 1700 units/hr Daily heparin level  Monitor labs, CBC, Heparin level  Pricilla Larsson, [facility]  02/12/2017 7:36 AM

## 2017-02-12 NOTE — Progress Notes (Signed)
PROGRESS NOTE    Michael Stephens  ERX:[id number] DOB: 06/17/1934 DOA: 02/09/2017 PCP: Deloria Lair., MD    Brief Narrative:  82 year old male who presented to the hospital with palpitations and dyspnea on exertion.  He was found to be in decompensated CHF in rapid atrial flutter.  He was started on intravenous diltiazem and intravenous diuresis.  Cardiology has been following.   Assessment & Plan:   Active Problems:   Atrial flutter (HCC)   Essential hypertension   Demand ischemia (HCC)   Acute diastolic CHF (congestive heart failure) (HCC)   Acute respiratory failure with hypoxia (HCC)   1. Atrial flutter with rapid ventricular response.  Currently on oral metoprolol with fair heart rate control. He has been weaned off of cardizem infusion.  Chadsvasc of at least 3.  Currently on heparin infusion.  Will need to transition to oral anticoagulation once decision on invasive versus noninvasive ischemic workup has been determined.   2. Demand ischemia.  Found to have elevated troponin of 2.5, trending down.  Felt to be demand ischemia in the setting of chronic obstructive coronary disease.  No wall motion abnormalities were noted on echocardiogram. He is NPO after midnight for possible stress test in AM 3. Acute diastolic congestive heart failure.  Currently on IV Lasix.  Had approximately 2.8 L of urine out yesterday.  Overall volume status -2.5 L since admission. Continue to monitor intake and output. 4. Acute respiratory failure with hypoxia.  Related to pulmonary edema from heart failure.  Monitor on increased dose of Lasix.  Oxygen requirement has increased over the last several days. Repeat chest x-ray today.  Try to wean off oxygen as tolerated. 5. Hypertension.  Blood pressures currently stable.  Continue on Lopressor   DVT prophylaxis: Heparin infusion Code Status: Full code Family Communication: Discussed with wife at the bedside Disposition Plan: Discharge home once  improved   Consultants:   Cardiology  Procedures:  Echo:- Left ventricle: The cavity size was normal. Wall thickness was   increased in a pattern of mild LVH. Systolic function was normal.   The estimated ejection fraction was in the range of 50% to 55%.   Doppler parameters are consistent with restrictive physiology,   indicative of decreased left ventricular diastolic compliance   and/or increased left atrial pressure. Doppler parameters are   consistent with high ventricular filling pressure. - Aortic valve: Mildly calcified annulus. Trileaflet; mildly   thickened leaflets. There was very mild stenosis. Valve area   (VTI): 1.59 cm^2. Valve area (Vmax): 1.73 cm^2. - Left atrium: The atrium was severely dilated. - Right atrium: The atrium was mildly dilated. - Atrial septum: No defect or patent foramen ovale was identified. - Inferior vena cava: The vessel was dilated. The respirophasic   diameter changes were blunted (< 50%), consistent with elevated   central venous pressure.  - Technically difficult study.  Antimicrobials:      Subjective: Shortness of breath improving. No chest pain  Objective: Vitals:   02/12/17 1710 02/12/17 1715 02/12/17 1725 02/12/17 1800  BP: 100/79 104/71 130/70 (!) 96/58  Pulse: (!) 46 (!) 41 (!) 130 (!) 109  Resp: (!) 31 (!) 24 (!) 26 (!) 24  Temp:      TempSrc:      SpO2: 95% (!) 80% 94% 93%  Weight:      Height:        Intake/Output Summary (Last 24 hours) at 02/12/17 1827 Last data filed at 02/12/17 1751  Gross per  24 hour  Intake          1152.08 ml  Output             2725 ml  Net         -1572.92 ml   Filed Weights   02/10/17 0500 02/11/17 0600 02/12/17 0500  Weight: 92.5 kg (203 lb 14.8 oz) 91.3 kg (201 lb 4.5 oz) 89.1 kg (196 lb 6.9 oz)    Examination:  General exam: Appears calm and comfortable  Respiratory system: clear bilaterally. Respiratory effort normal. Cardiovascular system: S1 & S2 heard, irregular, No  JVD, murmurs, rubs, gallops or clicks. 1+ pedal edema. Gastrointestinal system: Abdomen is nondistended, soft and nontender. No organomegaly or masses felt. Normal bowel sounds heard. Central nervous system: Alert and oriented. No focal neurological deficits. Extremities: Symmetric 5 x 5 power. Skin: No rashes, lesions or ulcers Psychiatry: Judgement and insight appear normal. Mood & affect appropriate.     Data Reviewed: I have personally reviewed following labs and imaging studies  CBC:  Recent Labs Lab 02/09/17 1028 02/10/17 0616 02/11/17 0538 02/12/17 0415  WBC 9.5 9.5 9.3 8.1  HGB 12.0* 11.0* 10.9* 11.2*  HCT 37.3* 34.0* 33.3* 34.9*  MCV 97.1 96.9 95.1 97.2  PLT 216 208 222 361   Basic Metabolic Panel:  Recent Labs Lab 02/09/17 1028 02/10/17 0044 02/10/17 0616 02/11/17 0538 02/12/17 0415  NA 139  --  134* 138 136  K 3.5  --  3.4* 3.2* 3.5  CL 99*  --  96* 97* 93*  CO2 31  --  30 32 35*  GLUCOSE 151*  --  117* 117* 98  BUN 35*  --  39* 35* 35*  CREATININE 0.92  --  0.90 0.77 0.82  CALCIUM 10.1  --  9.1 8.9 8.9  MG 1.9 2.0  --   --   --    GFR: Estimated Creatinine Clearance: 78.9 mL/min (by C-G formula based on SCr of 0.82 mg/dL). Liver Function Tests:  Recent Labs Lab 02/10/17 0616  AST 39  ALT 67*  ALKPHOS 53  BILITOT 1.4*  PROT 6.4*  ALBUMIN 3.7   No results for input(s): LIPASE, AMYLASE in the last 168 hours. No results for input(s): AMMONIA in the last 168 hours. Coagulation Profile:  Recent Labs Lab 02/09/17 1028  INR 1.15   Cardiac Enzymes:  Recent Labs Lab 02/09/17 1303 02/09/17 1815 02/10/17 0044 02/10/17 0616 02/10/17 1218  TROPONINI 2.36* 2.54* 2.26* 1.69* 1.11*   BNP (last 3 results) No results for input(s): PROBNP in the last 8760 hours. HbA1C: No results for input(s): HGBA1C in the last 72 hours. CBG: No results for input(s): GLUCAP in the last 168 hours. Lipid Profile: No results for input(s): CHOL, HDL, LDLCALC,  TRIG, CHOLHDL, LDLDIRECT in the last 72 hours. Thyroid Function Tests: No results for input(s): TSH, T4TOTAL, FREET4, T3FREE, THYROIDAB in the last 72 hours. Anemia Panel: No results for input(s): VITAMINB12, FOLATE, FERRITIN, TIBC, IRON, RETICCTPCT in the last 72 hours. Sepsis Labs: No results for input(s): PROCALCITON, LATICACIDVEN in the last 168 hours.  Recent Results (from the past 240 hour(s))  MRSA PCR Screening     Status: None   Collection Time: 02/09/17  6:23 PM  Result Value Ref Range Status   MRSA by PCR NEGATIVE NEGATIVE Final    Comment:        The GeneXpert MRSA Assay (FDA approved for NASAL specimens only), is one component of a comprehensive MRSA colonization surveillance  program. It is not intended to diagnose MRSA infection nor to guide or monitor treatment for MRSA infections.          Radiology Studies: No results found.      Scheduled Meds: . aspirin EC  81 mg Oral Daily  . furosemide  60 mg Intravenous BID  . hydrochlorothiazide  25 mg Oral Daily  . metoprolol tartrate  50 mg Oral BID  . off the beat book   Does not apply Once  . potassium chloride  40 mEq Oral Daily   Continuous Infusions: . sodium chloride Stopped (02/09/17 1800)  . diltiazem (CARDIZEM) infusion Stopped (02/11/17 1600)  . heparin 1,700 Units/hr (02/11/17 1521)     LOS: 3 days    Time spent: 65mins    Walter Min, MD [facility] Pager [phone]  If 7PM-7AM, please contact night-coverage [email or web address] Password [facility] 02/12/2017, 6:27 PM

## 2017-02-12 NOTE — Plan of Care (Signed)
Problem: Safety: Goal: Ability to remain free from injury will improve Outcome: Progressing Side rails up, bed in low position, call bell and personal items within reach

## 2017-02-13 ENCOUNTER — Inpatient Hospital Stay (HOSPITAL_BASED_OUTPATIENT_CLINIC_OR_DEPARTMENT_OTHER): Payer: Medicare Other

## 2017-02-13 DIAGNOSIS — I4892 Unspecified atrial flutter: Secondary | ICD-10-CM | POA: Diagnosis not present

## 2017-02-13 DIAGNOSIS — J189 Pneumonia, unspecified organism: Secondary | ICD-10-CM

## 2017-02-13 LAB — NM MYOCAR MULTI W/SPECT W/WALL MOTION / EF
CHL CUP NUCLEAR SDS: 6
CHL CUP NUCLEAR SRS: 6
CHL CUP RESTING HR STRESS: 89 {beats}/min
CSEPPHR: 134 {beats}/min
LV dias vol: 143 mL (ref 62–150)
LVSYSVOL: 94 mL
NUC STRESS TID: 1.07
RATE: 0.45
SSS: 12

## 2017-02-13 LAB — CBC
HEMATOCRIT: 34.9 % — AB (ref 39.0–52.0)
Hemoglobin: 11.5 g/dL — ABNORMAL LOW (ref 13.0–17.0)
MCH: 31.1 pg (ref 26.0–34.0)
MCHC: 33 g/dL (ref 30.0–36.0)
MCV: 94.3 fL (ref 78.0–100.0)
Platelets: 274 10*3/uL (ref 150–400)
RBC: 3.7 MIL/uL — ABNORMAL LOW (ref 4.22–5.81)
RDW: 14.3 % (ref 11.5–15.5)
WBC: 8.1 10*3/uL (ref 4.0–10.5)

## 2017-02-13 LAB — BASIC METABOLIC PANEL
ANION GAP: 9 (ref 5–15)
BUN: 32 mg/dL — ABNORMAL HIGH (ref 6–20)
CALCIUM: 8.8 mg/dL — AB (ref 8.9–10.3)
CO2: 32 mmol/L (ref 22–32)
CREATININE: 0.82 mg/dL (ref 0.61–1.24)
Chloride: 95 mmol/L — ABNORMAL LOW (ref 101–111)
Glucose, Bld: 92 mg/dL (ref 65–99)
Potassium: 3.3 mmol/L — ABNORMAL LOW (ref 3.5–5.1)
SODIUM: 136 mmol/L (ref 135–145)

## 2017-02-13 LAB — HEPARIN LEVEL (UNFRACTIONATED): HEPARIN UNFRACTIONATED: 0.53 [IU]/mL (ref 0.30–0.70)

## 2017-02-13 MED ORDER — METOPROLOL TARTRATE 25 MG PO TABS
25.0000 mg | ORAL_TABLET | Freq: Once | ORAL | Status: AC
  Administered 2017-02-13: 25 mg via ORAL
  Filled 2017-02-13: qty 1

## 2017-02-13 MED ORDER — REGADENOSON 0.4 MG/5ML IV SOLN
INTRAVENOUS | Status: AC
  Administered 2017-02-13: 0.4 mg via INTRAVENOUS
  Filled 2017-02-13: qty 5

## 2017-02-13 MED ORDER — ATORVASTATIN CALCIUM 40 MG PO TABS
80.0000 mg | ORAL_TABLET | Freq: Every day | ORAL | Status: DC
  Administered 2017-02-13 – 2017-02-15 (×3): 80 mg via ORAL
  Filled 2017-02-13 (×3): qty 2

## 2017-02-13 MED ORDER — GUAIFENESIN ER 600 MG PO TB12
1200.0000 mg | ORAL_TABLET | Freq: Two times a day (BID) | ORAL | Status: DC
  Administered 2017-02-13 – 2017-02-16 (×7): 1200 mg via ORAL
  Filled 2017-02-13 (×7): qty 2

## 2017-02-13 MED ORDER — SODIUM CHLORIDE 0.9% FLUSH
INTRAVENOUS | Status: AC
  Administered 2017-02-13: 10 mL via INTRAVENOUS
  Filled 2017-02-13: qty 10

## 2017-02-13 MED ORDER — TECHNETIUM TC 99M TETROFOSMIN IV KIT
30.0000 | PACK | Freq: Once | INTRAVENOUS | Status: AC | PRN
  Administered 2017-02-13: 33 via INTRAVENOUS

## 2017-02-13 MED ORDER — IPRATROPIUM BROMIDE 0.02 % IN SOLN: 0.5000 mg | Freq: Four times a day (QID) | RESPIRATORY_TRACT | Status: DC | PRN

## 2017-02-13 MED ORDER — METOPROLOL TARTRATE 50 MG PO TABS
75.0000 mg | ORAL_TABLET | Freq: Two times a day (BID) | ORAL | Status: DC
  Administered 2017-02-13 – 2017-02-16 (×6): 75 mg via ORAL
  Filled 2017-02-13 (×6): qty 1

## 2017-02-13 MED ORDER — IPRATROPIUM BROMIDE 0.02 % IN SOLN
0.5000 mg | Freq: Four times a day (QID) | RESPIRATORY_TRACT | Status: DC
  Administered 2017-02-13: 0.5 mg via RESPIRATORY_TRACT
  Filled 2017-02-13: qty 2.5

## 2017-02-13 MED ORDER — TECHNETIUM TC 99M TETROFOSMIN IV KIT
10.0000 | PACK | Freq: Once | INTRAVENOUS | Status: AC | PRN
  Administered 2017-02-13: 10.8 via INTRAVENOUS

## 2017-02-13 MED ORDER — LEVOFLOXACIN IN D5W 750 MG/150ML IV SOLN
750.0000 mg | INTRAVENOUS | Status: DC
  Administered 2017-02-13 – 2017-02-14 (×2): 750 mg via INTRAVENOUS
  Filled 2017-02-13 (×2): qty 150

## 2017-02-13 NOTE — Progress Notes (Signed)
Progress Note  Patient Name: Michael Stephens Date of Encounter: 02/13/2017  Primary Cardiologist: Dorris Carnes, MD  Subjective   No complaints of rapid heart rhythm, chest pain, or shortness of breath.  Continues to have complaints of insomnia and sinus congestion. ( patient assessed prior to Lexiscan stress test and stress lab)  Inpatient Medications    Scheduled Meds: . aspirin EC  81 mg Oral Daily  . furosemide  60 mg Intravenous BID  . hydrochlorothiazide  25 mg Oral Daily  . metoprolol tartrate  50 mg Oral BID  . off the beat book   Does not apply Once  . potassium chloride  40 mEq Oral Daily   Continuous Infusions: . sodium chloride Stopped (02/09/17 1800)  . diltiazem (CARDIZEM) infusion Stopped (02/11/17 1600)  . heparin 1,700 Units/hr (02/13/17 0500)   PRN Meds: sodium chloride, acetaminophen **OR** acetaminophen, alum & mag hydroxide-simeth, levalbuterol, ondansetron **OR** ondansetron (ZOFRAN) IV, sodium chloride, sodium chloride flush   Vital Signs    Vitals:   02/12/17 2028 02/12/17 2207 02/12/17 2215 02/13/17 0700  BP:  (!) 129/94 133/86 (!) 149/72  Pulse:  79 85 100  Resp:      Temp:   97.8 F (36.6 C) (!) 97 F (36.1 C)  TempSrc:   Oral   SpO2: 95%  99% 94%  Weight:      Height:        Intake/Output Summary (Last 24 hours) at 02/13/17 0801 Last data filed at 02/13/17 0600  Gross per 24 hour  Intake              508 ml  Output             1550 ml  Net            -1042 ml   Filed Weights   02/10/17 0500 02/11/17 0600 02/12/17 0500  Weight: 203 lb 14.8 oz (92.5 kg) 201 lb 4.5 oz (91.3 kg) 196 lb 6.9 oz (89.1 kg)    Telemetry    Atrial fibrillation with right bundle Berniece Abid block variable rates between 80 and 96 bpm- Personally Reviewed  Physical Exam   GEN: No acute distress.   Neck: No JVD Cardiac: RRR, no murmurs, rubs, or gallops.  Respiratory:  Crackles in the right base without wheezes and rhonchi GI: Soft, nontender, non-distended   MS: No edema; No deformity. Neuro:  Nonfocal  Psych: Normal affect   Labs    Chemistry Recent Labs Lab 02/10/17 0616 02/11/17 0538 02/12/17 0415 02/13/17 0433  NA 134* 138 136 136  K 3.4* 3.2* 3.5 3.3*  CL 96* 97* 93* 95*  CO2 30 32 35* 32  GLUCOSE 117* 117* 98 92  BUN 39* 35* 35* 32*  CREATININE 0.90 0.77 0.82 0.82  CALCIUM 9.1 8.9 8.9 8.8*  PROT 6.4*  --   --   --   ALBUMIN 3.7  --   --   --   AST 39  --   --   --   ALT 67*  --   --   --   ALKPHOS 53  --   --   --   BILITOT 1.4*  --   --   --   GFRNONAA >60 >60 >60 >60  GFRAA >60 >60 >60 >60  ANIONGAP 8 9 8 9      Hematology Recent Labs Lab 02/11/17 0538 02/12/17 0415 02/13/17 0433  WBC 9.3 8.1 8.1  RBC 3.50* 3.59* 3.70*  HGB 10.9*  11.2* 11.5*  HCT 33.3* 34.9* 34.9*  MCV 95.1 97.2 94.3  MCH 31.1 31.2 31.1  MCHC 32.7 32.1 33.0  RDW 14.6 14.5 14.3  PLT 222 242 274    Cardiac Enzymes Recent Labs Lab 02/09/17 1815 02/10/17 0044 02/10/17 0616 02/10/17 1218  TROPONINI 2.54* 2.26* 1.69* 1.11*   No results for input(s): TROPIPOC in the last 168 hours.   BNP Recent Labs Lab 02/10/17 0619  BNP 212.0*     DDimer No results for input(s): DDIMER in the last 168 hours.   Radiology    Dg Chest Port 1 View  Result Date: 02/12/2017 CLINICAL DATA:  82 y/o  M; shortness of breath. EXAM: PORTABLE CHEST 1 VIEW COMPARISON:  02/09/2017 chest radiograph FINDINGS: Right mid and lower lung zone consolidation. Small right pleural effusion. Stable cardiomegaly. Bones are unremarkable. IMPRESSION: Right mid and lower lung zone consolidation and small right pleural effusion suspicious for pneumonia. Stable cardiomegaly. Electronically Signed   By: Kristine Garbe M.D.   On: 02/12/2017 22:31    Cardiac Studies  Echocardiogram 02/10/2017 Left ventricle: The cavity size was normal. Wall thickness was increased in a pattern of mild LVH. Systolic function was normal. The estimated ejection fraction was in  the range of 50% to 55%. Doppler parameters are consistent with restrictive physiology, indicative of decreased left ventricular diastolic compliance and/or increased left atrial pressure. Doppler parameters are consistent with high ventricular filling pressure. - Aortic valve: Mildly calcified annulus. Trileaflet; mildly thickened leaflets. There was very mild stenosis. Valve area (VTI): 1.59 cm^2. Valve area (Vmax): 1.73 cm^2. - Left atrium: The atrium was severely dilated. - Right atrium: The atrium was mildly dilated. - Atrial septum: No defect or patent foramen ovale was identified. - Inferior vena cava: The vessel was dilated. The respirophasic diameter changes were blunted (<50%), consistent with elevated central venous pressure. - Technically difficult study.   Patient Profile     82 y.o. male admitted from PCP office after coming in for flu shot and found to be tachycardic. EKG revealed atrial flutter. He is now on heparin for anticoagulation with CHADS VASC Score of 4. Found to have elevated troponin and pulmonary edema on CXR, given lasix in ER.elevated troponin believed to be demand ischemia per Dr. Harrington Challenger. Other history includes HTN.  Having Lexiscan Myoview this am  Assessment & Plan    1. Atrial Arrhythmia's: Atrial flutter with MAT was noted on admission.  Patient remains on heparin with possible transition to [place] after Lexiscan Myoview completed if no evidence of ischemia is seen.  Remains on Lopressor 25 mg 3 times daily and diltiazem .Echo demonstrated normal EF without WMA. More recommendations after stress test. If no ischemia, may consider OP DCCV after being on DOAC for 2 weeks.   2. Acute Diastolic CHF: He has diuresed 3.584 L since admission, 1.55 L overnight.  I am hearing some right basilar crackles this a.m.  Chest x-ray completed on 02/12/2017 demonstrated right mid and base consolidation worrisome for pneumonia.  May need to consider antibiotic  therapy for HCAP.  Remains on oxygen therapy via nasal cannula.  3.  Elevated troponin: Looks most likely related to demand ischemia in the setting of respiratory distress, rapid atrial fibrillation.  Echocardiogram reassuring for no changes in wall motion abnormality with normal ejection fraction.  Lexiscan Myoview this a.m. we will determine further medical management versus invasive testing.    Signed, Phill Myron. Lawrence DNP, ANP, AACC   02/13/2017, 8:01 AM  Patient seen and discussed with DNP Purcell Nails, I agree with her documentation above. Admitted with symptomatic atrial arrhythmias. This admit he has had aflutter, afib, and MAT. Rate control with lopressor,  with high rates at times, increase to 75mg  bid. Has been on heparin gtt. Elevated troponin in setting of significant tachycardia. Peak trop 2.5. Echo LVEF 50-55%, restrictive diastolic dysfunction. His symptoms have improved with increased rate control and diuresis. Stress test shows inferior/interolateral infarct with mild to moderate peri-infarct ischemia. LVEF by nuclear study inaccurate at 34%, by echo his LVEF is normal. I suspect chronic obstructive CAD exacerbated by recent issues with atrial arrhythmias with tachycardia and decompensated diastolic heart failure. Would focus on optimizing arrhythmia control and volume status and following symptoms, if refractory would consider possible cath at that time. Would continue heparin at this time in case cath becomes indicated, will need to transition to oral anticoagulation prior to discharge.   For CAD medical therapy with ASA, hep, lopressor. We will start high dose statin. Hold on ACE/ARB to allow room for titration of av nodal agents.  For diastolic HF negative 1 liter yesterday,negative 3.5 liters since admission. Renal function remains stable, continue IV diuretics. Repeat CXR.  Over weekend titrate beta blocker as needed for rate control, can add dilt as well if needed pending  blood pressures.    Carlyle Dolly MD

## 2017-02-13 NOTE — Care Management Important Message (Signed)
Important Message  Patient Details  Name: Orvil Faraone MRN: [id number] Date of Birth: 1934-07-06   Medicare Important Message Given:  Yes    Sherald Barge, RN 02/13/2017, 3:27 PM

## 2017-02-13 NOTE — Plan of Care (Signed)
Problem: Safety: Goal: Ability to remain free from injury will improve Outcome: Progressing Discuss with client need to provide ongoing safe environment-encouraged to call before fall; call bell placed within reach; bed alarm activated; bed in low position; side rails up x2 to maintain safety.

## 2017-02-13 NOTE — Evaluation (Signed)
Physical Therapy Evaluation Patient Details Name: Michael Stephens MRN: [id number] DOB: Oct 02, 1934 Today's Date: 02/13/2017   History of Present Illness  Michael Stephens is a 82 y.o. male with medical history significant of hypertension, who presents with dyspnea and palpitations for last 72 hours. Palpitations have been intermittent, moderate to severe intensity, have been increasing in frequency and duration, not exertion related, associated with lower extremity edema, and worsening dyspnea on exertion. To me denies PND orthopnea. Today his heart rate was found to be elevated when he went to get influenza vaccination and he was referred to the nursing home for evaluation.    Clinical Impression  Patient demonstrates occasional swaying with near loss of balance during gait training requiring assistance to avoid falling, limited secondary to c/o fatigue.  Patient on room air throughout treatment with O2 sats at 93-94% before walking, and 93-94% afterwards.  Patient put back on 3 LPM.  Patient will benefit from continued physical therapy in hospital and recommended venue below to increase strength, balance, endurance for safe ADLs and gait.    Follow Up Recommendations Home health PT;Supervision - Intermittent    Equipment Recommendations  Cane    Recommendations for Other Services       Precautions / Restrictions Precautions Precautions: Fall Restrictions Weight Bearing Restrictions: No      Mobility  Bed Mobility Overal bed mobility: Needs Assistance Bed Mobility: Supine to Sit;Sit to Supine     Supine to sit: Supervision Sit to supine: Supervision      Transfers Overall transfer level: Needs assistance Equipment used: 1 person hand held assist;None Transfers: Sit to/from [facility] Sit to Stand: Min guard Stand pivot transfers: Min guard          Ambulation/Gait Ambulation/Gait assistance: Min assist Ambulation Distance (Feet): 45 Feet Assistive  device: None;1 person hand held assist Gait Pattern/deviations: Decreased step length - left;Decreased stance time - right;Decreased stride length;Staggering right   Gait velocity interpretation: Below normal speed for age/gender General Gait Details: Patient demonstrates slighlty labored slow cadence with occasional staggering to the right with frequent use of siderail, nearby objects to support self, limited secondary to c/o fatigue  Stairs            Wheelchair Mobility    Modified Rankin (Stroke Patients Only)       Balance Overall balance assessment: Needs assistance Sitting-balance support: Feet supported;No upper extremity supported Sitting balance-Leahy Scale: Good     Standing balance support: No upper extremity supported;During functional activity Standing balance-Leahy Scale: Fair                               Pertinent Vitals/Pain Pain Assessment: No/denies pain    Home Living Family/patient expects to be discharged to:: Private residence Living Arrangements: Spouse/significant other Available Help at Discharge: Family Type of Home: House Home Access: Stairs to enter Entrance Stairs-Rails: Psychiatric nurse of Steps: 2 in the front, 7-8 steps in the back, both have bilateral siderails Home Layout: One level (has basement, but they do not use) Home Equipment: None      Prior Function Level of Independence: Independent               Hand Dominance        Extremity/Trunk Assessment   Upper Extremity Assessment Upper Extremity Assessment: Generalized weakness    Lower Extremity Assessment Lower Extremity Assessment: Generalized weakness    Cervical / Trunk Assessment Cervical /  Trunk Assessment: Normal  Communication   Communication: No difficulties  Cognition Arousal/Alertness: Awake/alert Behavior During Therapy: WFL for tasks assessed/performed Overall Cognitive Status: Within Functional Limits for tasks  assessed                                        General Comments      Exercises     Assessment/Plan    PT Assessment Patient needs continued PT services  PT Problem List Decreased strength;Decreased activity tolerance;Decreased balance;Decreased mobility       PT Treatment Interventions Gait training;Functional mobility training;Stair training;Therapeutic activities;Therapeutic exercise;Patient/family education    PT Goals (Current goals can be found in the Care Plan section)  Acute Rehab PT Goals Patient Stated Goal: return home PT Goal Formulation: With patient/family Time For Goal Achievement: 02/17/17 Potential to Achieve Goals: Good    Frequency Min 3X/week   Barriers to discharge        Co-evaluation               AM-PAC PT "6 Clicks" Daily Activity  Outcome Measure Difficulty turning over in bed (including adjusting bedclothes, sheets and blankets)?: None Difficulty moving from lying on back to sitting on the side of the bed? : None Difficulty sitting down on and standing up from a chair with arms (e.g., wheelchair, bedside commode, etc,.)?: None Help needed moving to and from a bed to chair (including a wheelchair)?: A Little Help needed walking in hospital room?: A Little Help needed climbing 3-5 steps with a railing? : A Little 6 Click Score: 21    End of Session Equipment Utilized During Treatment: Gait belt Activity Tolerance: Patient tolerated treatment well;Patient limited by fatigue Patient left: in bed;with call bell/phone within reach;with family/visitor present (seated at bedside) Nurse Communication: Mobility status PT Visit Diagnosis: Unsteadiness on feet (R26.81);Other abnormalities of gait and mobility (R26.89);Muscle weakness (generalized) (M62.81)    Time: 3149-7026 PT Time Calculation (min) (ACUTE ONLY): 28 min   Charges:   PT Evaluation $PT Eval Low Complexity: 1 Low PT Treatments $Therapeutic Activity: 23-37  mins   PT G Codes:        3:16 PM, 04-Mar-2017 Lonell Grandchild, MPT Physical Therapist with [facility] [phone] office [phone] mobile phone

## 2017-02-13 NOTE — Progress Notes (Signed)
PROGRESS NOTE    Michael Stephens  JSE:[id number] DOB: Aug 08, 1934 DOA: 02/09/2017 PCP: Deloria Lair., MD    Brief Narrative:  82 year old male who presented to the hospital with palpitations and dyspnea on exertion.  He was found to be in decompensated CHF in rapid atrial flutter.  He was started on intravenous diltiazem and intravenous diuresis.  Cardiology has been following.   Assessment & Plan:   Active Problems:   Atrial flutter (HCC)   Essential hypertension   Demand ischemia (HCC)   Acute diastolic CHF (congestive heart failure) (HCC)   Acute respiratory failure with hypoxia (HCC)   1. Atrial flutter with rapid ventricular response.  Currently on oral metoprolol with fair heart rate control.  Still having episodes of tachycardia.  Metoprolol has been further adjusted.  He has been weaned off of cardizem infusion.  Chadsvasc of at least 3.  Currently on heparin infusion.  Will need to transition to oral anticoagulation once decision on invasive ischemic workup has been determined.   2. Demand ischemia.  Found to have elevated troponin of 2.5, trending down.  Felt to be demand ischemia in the setting of chronic obstructive coronary disease.  No wall motion abnormalities were noted on echocardiogram.  Stress test done this morning was found to be lower study. 3. Acute diastolic congestive heart failure.  Currently on IV Lasix. Overall volume status -3.5 L since admission.  Still has evidence of volume overload.  Continue current treatments.  Continue to monitor intake and output. 4. Acute respiratory failure with hypoxia.  Related to heart failure/pneumonia.  Repeat chest x-ray shows some right middle/lower lobe consolidation.  Will try to wean off oxygen as tolerated. 5. Pneumonia, healthcare associated.  Start the patient on Levaquin.  Will also start pulmonary hygiene with Mucinex and Atrovent nebs. 6. Hypertension.  Blood pressures currently stable.  Continue on Lopressor   DVT  prophylaxis: Heparin infusion Code Status: Full code Family Communication: Discussed with wife at the bedside Disposition Plan: Discharge home once improved   Consultants:   Cardiology  Procedures:  Echo:- Left ventricle: The cavity size was normal. Wall thickness was   increased in a pattern of mild LVH. Systolic function was normal.   The estimated ejection fraction was in the range of 50% to 55%.   Doppler parameters are consistent with restrictive physiology,   indicative of decreased left ventricular diastolic compliance   and/or increased left atrial pressure. Doppler parameters are   consistent with high ventricular filling pressure. - Aortic valve: Mildly calcified annulus. Trileaflet; mildly   thickened leaflets. There was very mild stenosis. Valve area   (VTI): 1.59 cm^2. Valve area (Vmax): 1.73 cm^2. - Left atrium: The atrium was severely dilated. - Right atrium: The atrium was mildly dilated. - Atrial septum: No defect or patent foramen ovale was identified. - Inferior vena cava: The vessel was dilated. The respirophasic   diameter changes were blunted (< 50%), consistent with elevated   central venous pressure.  - Technically difficult study.  Antimicrobials:   levaquin 11/2>   Subjective: Shortness of breath improving. No chest pain  Objective: Vitals:   02/12/17 2028 02/12/17 2207 02/12/17 2215 02/13/17 0700  BP:  (!) 129/94 133/86 (!) 149/72  Pulse:  79 85 100  Resp:      Temp:   97.8 F (36.6 C) (!) 97 F (36.1 C)  TempSrc:   Oral   SpO2: 95%  99% 94%  Weight:      Height:  Intake/Output Summary (Last 24 hours) at 02/13/17 1505 Last data filed at 02/13/17 0800  Gross per 24 hour  Intake              508 ml  Output              675 ml  Net             -167 ml   Filed Weights   02/10/17 0500 02/11/17 0600 02/12/17 0500  Weight: 92.5 kg (203 lb 14.8 oz) 91.3 kg (201 lb 4.5 oz) 89.1 kg (196 lb 6.9 oz)    Examination:  General exam:  Appears calm and comfortable  Respiratory system: clear bilaterally. Respiratory effort normal. Cardiovascular system: S1 & S2 heard, irregular, No JVD, murmurs, rubs, gallops or clicks. 1+ pedal edema. Gastrointestinal system: Abdomen is nondistended, soft and nontender. No organomegaly or masses felt. Normal bowel sounds heard. Central nervous system: Alert and oriented. No focal neurological deficits. Extremities: Symmetric 5 x 5 power. Skin: No rashes, lesions or ulcers Psychiatry: Judgement and insight appear normal. Mood & affect appropriate.     Data Reviewed: I have personally reviewed following labs and imaging studies  CBC:  Recent Labs Lab 02/09/17 1028 02/10/17 0616 02/11/17 0538 02/12/17 0415 02/13/17 0433  WBC 9.5 9.5 9.3 8.1 8.1  HGB 12.0* 11.0* 10.9* 11.2* 11.5*  HCT 37.3* 34.0* 33.3* 34.9* 34.9*  MCV 97.1 96.9 95.1 97.2 94.3  PLT 216 208 222 242 735   Basic Metabolic Panel:  Recent Labs Lab 02/09/17 1028 02/10/17 0044 02/10/17 0616 02/11/17 0538 02/12/17 0415 02/13/17 0433  NA 139  --  134* 138 136 136  K 3.5  --  3.4* 3.2* 3.5 3.3*  CL 99*  --  96* 97* 93* 95*  CO2 31  --  30 32 35* 32  GLUCOSE 151*  --  117* 117* 98 92  BUN 35*  --  39* 35* 35* 32*  CREATININE 0.92  --  0.90 0.77 0.82 0.82  CALCIUM 10.1  --  9.1 8.9 8.9 8.8*  MG 1.9 2.0  --   --   --   --    GFR: Estimated Creatinine Clearance: 78.9 mL/min (by C-G formula based on SCr of 0.82 mg/dL). Liver Function Tests:  Recent Labs Lab 02/10/17 0616  AST 39  ALT 67*  ALKPHOS 53  BILITOT 1.4*  PROT 6.4*  ALBUMIN 3.7   No results for input(s): LIPASE, AMYLASE in the last 168 hours. No results for input(s): AMMONIA in the last 168 hours. Coagulation Profile:  Recent Labs Lab 02/09/17 1028  INR 1.15   Cardiac Enzymes:  Recent Labs Lab 02/09/17 1303 02/09/17 1815 02/10/17 0044 02/10/17 0616 02/10/17 1218  TROPONINI 2.36* 2.54* 2.26* 1.69* 1.11*   BNP (last 3  results) No results for input(s): PROBNP in the last 8760 hours. HbA1C: No results for input(s): HGBA1C in the last 72 hours. CBG: No results for input(s): GLUCAP in the last 168 hours. Lipid Profile: No results for input(s): CHOL, HDL, LDLCALC, TRIG, CHOLHDL, LDLDIRECT in the last 72 hours. Thyroid Function Tests: No results for input(s): TSH, T4TOTAL, FREET4, T3FREE, THYROIDAB in the last 72 hours. Anemia Panel: No results for input(s): VITAMINB12, FOLATE, FERRITIN, TIBC, IRON, RETICCTPCT in the last 72 hours. Sepsis Labs: No results for input(s): PROCALCITON, LATICACIDVEN in the last 168 hours.  Recent Results (from the past 240 hour(s))  MRSA PCR Screening     Status: None   Collection Time: 02/09/17  6:23 PM  Result Value Ref Range Status   MRSA by PCR NEGATIVE NEGATIVE Final    Comment:        The GeneXpert MRSA Assay (FDA approved for NASAL specimens only), is one component of a comprehensive MRSA colonization surveillance program. It is not intended to diagnose MRSA infection nor to guide or monitor treatment for MRSA infections.          Radiology Studies: Nm Myocar Multi W/spect W/wall Motion / Ef  Result Date: 02/13/2017  There was no ST segment deviation noted during stress.  Findings consistent with prior inferior/inferolateral myocardial infarction with mild to moderate peri-infarct ischemia.  This is a high risk study. High risk based on decreased LVEF. Based on prior infarct and current level of ischemia alone this would sugest low to intermediate risk . Recommend correlate findings with echo  The left ventricular ejection fraction is moderately decreased (30-44%).    Dg Chest Port 1 View  Result Date: 02/12/2017 CLINICAL DATA:  82 y/o  M; shortness of breath. EXAM: PORTABLE CHEST 1 VIEW COMPARISON:  02/09/2017 chest radiograph FINDINGS: Right mid and lower lung zone consolidation. Small right pleural effusion. Stable cardiomegaly. Bones are  unremarkable. IMPRESSION: Right mid and lower lung zone consolidation and small right pleural effusion suspicious for pneumonia. Stable cardiomegaly. Electronically Signed   By: Kristine Garbe M.D.   On: 02/12/2017 22:31        Scheduled Meds: . aspirin EC  81 mg Oral Daily  . atorvastatin  80 mg Oral q1800  . furosemide  60 mg Intravenous BID  . hydrochlorothiazide  25 mg Oral Daily  . metoprolol tartrate  75 mg Oral BID  . off the beat book   Does not apply Once  . potassium chloride  40 mEq Oral Daily   Continuous Infusions: . sodium chloride Stopped (02/09/17 1800)  . heparin 1,700 Units/hr (02/13/17 0500)     LOS: 4 days    Time spent: 2mins    MEMON,JEHANZEB, MD [facility] Pager [phone]  If 7PM-7AM, please contact night-coverage [email or web address] Password [email or web address] 02/13/2017, 3:05 PM

## 2017-02-13 NOTE — Care Management (Signed)
PT has made [facility] PT recommendation. Pt is not homebound and does not qualify for [facility]. He needs a cane and has no preference of DME provider for cane. Pt cont to need supplemental oxygen, pt has no preference of oxygen provider if he is unable to wean prior to DC. Vaughan Basta, [facility] rep, aware of referral for cane and will deliver to room today.

## 2017-02-13 NOTE — Progress Notes (Signed)
Pt took scheduled nebulizer treatment but doesn't wish to continue to take them. I explained to pt that the Dr. Vonzella Nipple the treatments as part of his treatment plan but he doesn't want anymore treatments. He did not score high enough for schedule treatments on the Respiratory Assessment Protocol

## 2017-02-13 NOTE — Progress Notes (Signed)
ANTICOAGULATION CONSULT NOTE - follow up  Pharmacy Consult for Heparin Indication: chest pain/ACS  Allergies  Allergen Reactions  . Penicillins     Has patient had a PCN reaction causing immediate rash, facial/tongue/throat swelling, SOB or lightheadedness with hypotension: Yes Has patient had a PCN reaction causing severe rash involving mucus membranes or skin necrosis: No Has patient had a PCN reaction that required hospitalization: No Has patient had a PCN reaction occurring within the last 10 years: No If all of the above answers are "NO", then may proceed with Cephalosporin use.   Patient Measurements: Height: 6' (182.9 cm) Weight: 196 lb 6.9 oz (89.1 kg) IBW/kg (Calculated) : 77.6 HEPARIN DW (KG): 93.4  Vital Signs: Temp: 97 F (36.1 C) (11/02 0700) Temp Source: Oral (11/01 2215) BP: 149/72 (11/02 0700) Pulse Rate: 100 (11/02 0700)  Labs:  Recent Labs  02/10/17 1218  02/11/17 0538 02/12/17 0415 02/13/17 0433  HGB  --   < > 10.9* 11.2* 11.5*  HCT  --   --  33.3* 34.9* 34.9*  PLT  --   --  222 242 274  HEPARINUNFRC  --   < > 0.46 0.57 0.53  CREATININE  --   --  0.77 0.82 0.82  TROPONINI 1.11*  --   --   --   --   < > = values in this interval not displayed.  Estimated Creatinine Clearance: 78.9 mL/min (by C-G formula based on SCr of 0.82 mg/dL).   Medical History: Past Medical History:  Diagnosis Date  . Acute diastolic CHF (congestive heart failure) ([place]) 02/11/2017  . Hypertension   . Renal disorder    cyst on kidney   Medications:  Medications Prior to Admission  Medication Sig Dispense Refill  . aspirin 325 MG tablet Take 325 mg by mouth every 4 (four) hours as needed for mild pain, moderate pain or headache.    . Calcium Carbonate Antacid (ALKA-SELTZER ANTACID PO) Take 1 Dose by mouth daily as needed (indegestion).    Marland Kitchen losartan-hydrochlorothiazide (HYZAAR) 100-12.5 MG tablet Take 1 tablet by mouth daily.     Assessment: 82 yo male present to ED  with tachycardia. No chest pain, no N/V. He had Elevated troponins.  Possible NSTEMI vs elevated troponin related to the stress of the tachycardia. Chadsvasc score =3.  Cardizem ordered for rate control. Heparin level @ goal. No bleeding noted, CBC stable.  CHADSVasc score of at least 3. Currently on heparin in case invasive procedures indicated, will need to transition to oral anticoagulation once that's decided  Goal of Therapy:  Heparin level 0.3-0.7 units/ml Monitor platelets by anticoagulation protocol: Yes   Plan:  Continue Heparin @ 1700 units/hr Daily heparin level  Monitor for bleeding complications  Beverlee Nims, [facility]  02/13/2017 8:27 AM

## 2017-02-14 LAB — CBC
HEMATOCRIT: 35.4 % — AB (ref 39.0–52.0)
Hemoglobin: 11.6 g/dL — ABNORMAL LOW (ref 13.0–17.0)
MCH: 30.9 pg (ref 26.0–34.0)
MCHC: 32.8 g/dL (ref 30.0–36.0)
MCV: 94.4 fL (ref 78.0–100.0)
PLATELETS: 278 10*3/uL (ref 150–400)
RBC: 3.75 MIL/uL — ABNORMAL LOW (ref 4.22–5.81)
RDW: 14.3 % (ref 11.5–15.5)
WBC: 8.2 10*3/uL (ref 4.0–10.5)

## 2017-02-14 LAB — BASIC METABOLIC PANEL
Anion gap: 8 (ref 5–15)
BUN: 34 mg/dL — AB (ref 6–20)
CALCIUM: 9 mg/dL (ref 8.9–10.3)
CO2: 32 mmol/L (ref 22–32)
Chloride: 94 mmol/L — ABNORMAL LOW (ref 101–111)
Creatinine, Ser: 1 mg/dL (ref 0.61–1.24)
GFR calc Af Amer: >60 mL/min (ref >60)
GLUCOSE: 171 mg/dL — AB (ref 65–99)
Potassium: 3.6 mmol/L (ref 3.5–5.1)
SODIUM: 134 mmol/L — AB (ref 135–145)

## 2017-02-14 LAB — HEPARIN LEVEL (UNFRACTIONATED): HEPARIN UNFRACTIONATED: 0.32 [IU]/mL (ref 0.30–0.70)

## 2017-02-14 MED ORDER — LEVOFLOXACIN 750 MG PO TABS
750.0000 mg | ORAL_TABLET | Freq: Every day | ORAL | Status: DC
  Administered 2017-02-14 – 2017-02-16 (×3): 750 mg via ORAL
  Filled 2017-02-14 (×3): qty 1

## 2017-02-14 NOTE — Progress Notes (Signed)
Physical Therapy Treatment Patient Details Name: Michael Stephens MRN: [id number] DOB: 04/23/1934 Today's Date: 02/14/2017    History of Present Illness Michael Stephens is a 82 y.o. male with medical history significant of hypertension, who presents with dyspnea and palpitations for last 72 hours. Palpitations have been intermittent, moderate to severe intensity, have been increasing in frequency and duration, not exertion related, associated with lower extremity edema, and worsening dyspnea on exertion. To me denies PND orthopnea. Today his heart rate was found to be elevated when he went to get influenza vaccination and he was referred to the nursing home for evaluation.    PT Comments    Patient demonstrates improved balance for gait training without having to lean on walls or side rails, limited secondary to c/o of fatiguing of legs.  Patient will benefit from continued physical therapy in hospital and recommended venue below to increase strength, balance, endurance for safe ADLs and gait.    Follow Up Recommendations  Home health PT;Supervision - Intermittent     Equipment Recommendations  Cane    Recommendations for Other Services       Precautions / Restrictions Precautions Precautions: Fall Restrictions Weight Bearing Restrictions: No    Mobility  Bed Mobility Overal bed mobility: Modified Independent                Transfers Overall transfer level: Needs assistance Equipment used: 1 person hand held assist;None Transfers: Sit to/from [employer] Transfers   Stand pivot transfers: Supervision          Ambulation/Gait Ambulation/Gait assistance: Supervision Ambulation Distance (Feet): 100 Feet Assistive device: None Gait Pattern/deviations: WFL(Within Functional Limits)   Gait velocity interpretation: Below normal speed for age/gender General Gait Details: grossly WFL except occasional swaying without loss of balance, did not have to lean on siderails  today   Stairs            Wheelchair Mobility    Modified Rankin (Stroke Patients Only)       Balance Overall balance assessment: Needs assistance Sitting-balance support: Feet supported;No upper extremity supported Sitting balance-Leahy Scale: Good     Standing balance support: No upper extremity supported;During functional activity Standing balance-Leahy Scale: Fair                              Cognition Arousal/Alertness: Awake/alert Behavior During Therapy: WFL for tasks assessed/performed Overall Cognitive Status: Within Functional Limits for tasks assessed                                        Exercises      General Comments        Pertinent Vitals/Pain Pain Assessment: No/denies pain    Home Living                      Prior Function            PT Goals (current goals can now be found in the care plan section) Acute Rehab PT Goals Patient Stated Goal: return home PT Goal Formulation: With patient/family Time For Goal Achievement: 02/17/17 Potential to Achieve Goals: Good Progress towards PT goals: Progressing toward goals    Frequency    Min 3X/week      PT Plan Current plan remains appropriate    Co-evaluation  AM-PAC PT "6 Clicks" Daily Activity  Outcome Measure  Difficulty turning over in bed (including adjusting bedclothes, sheets and blankets)?: None Difficulty moving from lying on back to sitting on the side of the bed? : None Difficulty sitting down on and standing up from a chair with arms (e.g., wheelchair, bedside commode, etc,.)?: None Help needed moving to and from a bed to chair (including a wheelchair)?: A Little Help needed walking in hospital room?: A Little Help needed climbing 3-5 steps with a railing? : A Little 6 Click Score: 21    End of Session   Activity Tolerance: Patient tolerated treatment well;Patient limited by fatigue Patient left: in bed  (seated at bedside) Nurse Communication: Mobility status PT Visit Diagnosis: Unsteadiness on feet (R26.81);Other abnormalities of gait and mobility (R26.89);Muscle weakness (generalized) (M62.81)     Time: 1245-8099 PT Time Calculation (min) (ACUTE ONLY): 19 min  Charges:  $Therapeutic Activity: 8-22 mins                    G Codes:       11:51 AM, 03/07/2017 Lonell Grandchild, MPT Physical Therapist with [facility] 2 [phone] office [phone] mobile phone

## 2017-02-14 NOTE — Progress Notes (Signed)
PROGRESS NOTE    Michael Stephens  KGM:[id number] DOB: 03-03-1935 DOA: 02/09/2017 PCP: Deloria Lair., MD    Brief Narrative:  82 year old male who presented to the hospital with palpitations and dyspnea on exertion.  He was found to be in decompensated CHF in rapid atrial flutter.  He was started on intravenous diltiazem and intravenous diuresis.  Cardiology has been following.   Assessment & Plan:   Active Problems:   Atrial flutter (HCC)   Essential hypertension   Demand ischemia (HCC)   Acute diastolic CHF (congestive heart failure) (HCC)   Acute respiratory failure with hypoxia (HCC)   1. Atrial flutter with rapid ventricular response.  Currently on oral metoprolol with fair heart rate control.  Still having episodes of tachycardia.  Metoprolol has been further adjusted.  He has been weaned off of cardizem infusion.  Chadsvasc of at least 3.  Currently on heparin infusion.  Will need to transition to oral anticoagulation once decision on invasive ischemic workup has been determined.   2. Demand ischemia.  Found to have elevated troponin of 2.5, trending down.  Felt to be demand ischemia in the setting of chronic obstructive coronary disease.  No wall motion abnormalities were noted on echocardiogram.  Stress test done this morning was found to be a low risk study. 3. Acute diastolic congestive heart failure.  Currently on IV Lasix. Overall volume status -4 L since admission.  Still has evidence of volume overload.  Continue current treatments.  Weights are trending down but have not been checked in a few days. Continue to monitor intake and output. 4. Acute respiratory failure with hypoxia.  Related to heart failure/pneumonia.  Repeat chest x-ray shows some right middle/lower lobe consolidation.  Will try to wean off oxygen as tolerated. 5. Pneumonia, healthcare associated.  Started the patient on Levaquin.  Will also start pulmonary hygiene with Mucinex and Atrovent  nebs. 6. Hypertension.  Blood pressures currently stable.  Continue on Lopressor   DVT prophylaxis: Heparin infusion Code Status: Full code Family Communication: Discussed with wife at the bedside Disposition Plan: Discharge home once improved   Consultants:   Cardiology  Procedures:  Echo:- Left ventricle: The cavity size was normal. Wall thickness was   increased in a pattern of mild LVH. Systolic function was normal.   The estimated ejection fraction was in the range of 50% to 55%.   Doppler parameters are consistent with restrictive physiology,   indicative of decreased left ventricular diastolic compliance   and/or increased left atrial pressure. Doppler parameters are   consistent with high ventricular filling pressure. - Aortic valve: Mildly calcified annulus. Trileaflet; mildly   thickened leaflets. There was very mild stenosis. Valve area   (VTI): 1.59 cm^2. Valve area (Vmax): 1.73 cm^2. - Left atrium: The atrium was severely dilated. - Right atrium: The atrium was mildly dilated. - Atrial septum: No defect or patent foramen ovale was identified. - Inferior vena cava: The vessel was dilated. The respirophasic   diameter changes were blunted (< 50%), consistent with elevated   central venous pressure.  - Technically difficult study.  Antimicrobials:   levaquin 11/2>   Subjective: No chest pain.  Feels shortness of breath is improving.  No significant cough.  Objective: Vitals:   02/13/17 1400 02/13/17 2049 02/13/17 2200 02/14/17 0514  BP: 103/61  116/71 113/76  Pulse: 99  (!) 101 72  Resp: 18  20 (!) 22  Temp: 98.4 F (36.9 C)  98.8 F (37.1 C) 98.4 F (  36.9 C)  TempSrc: Oral  Oral Oral  SpO2: 97% 97% 96% 97%  Weight:      Height:        Intake/Output Summary (Last 24 hours) at 02/14/17 1640 Last data filed at 02/14/17 1200  Gross per 24 hour  Intake          1005.42 ml  Output             1450 ml  Net          -444.58 ml   Filed Weights    02/10/17 0500 02/11/17 0600 02/12/17 0500  Weight: 92.5 kg (203 lb 14.8 oz) 91.3 kg (201 lb 4.5 oz) 89.1 kg (196 lb 6.9 oz)    Examination:  General exam: Appears calm and comfortable  Respiratory system: Diminished breath sounds with crackles at the right base. Respiratory effort normal. Cardiovascular system: S1 & S2 heard, irregular, No JVD, murmurs, rubs, gallops or clicks. 1+ pedal edema. Gastrointestinal system: Abdomen is nondistended, soft and nontender. No organomegaly or masses felt. Normal bowel sounds heard. Central nervous system: Alert and oriented. No focal neurological deficits. Extremities: Symmetric 5 x 5 power. Skin: No rashes, lesions or ulcers Psychiatry: Judgement and insight appear normal. Mood & affect appropriate.     Data Reviewed: I have personally reviewed following labs and imaging studies  CBC:  Recent Labs Lab 02/10/17 0616 02/11/17 0538 02/12/17 0415 02/13/17 0433 02/14/17 0657  WBC 9.5 9.3 8.1 8.1 8.2  HGB 11.0* 10.9* 11.2* 11.5* 11.6*  HCT 34.0* 33.3* 34.9* 34.9* 35.4*  MCV 96.9 95.1 97.2 94.3 94.4  PLT 208 222 242 274 425   Basic Metabolic Panel:  Recent Labs Lab 02/09/17 1028 02/10/17 0044 02/10/17 0616 02/11/17 0538 02/12/17 0415 02/13/17 0433 02/14/17 1339  NA 139  --  134* 138 136 136 134*  K 3.5  --  3.4* 3.2* 3.5 3.3* 3.6  CL 99*  --  96* 97* 93* 95* 94*  CO2 31  --  30 32 35* 32 32  GLUCOSE 151*  --  117* 117* 98 92 171*  BUN 35*  --  39* 35* 35* 32* 34*  CREATININE 0.92  --  0.90 0.77 0.82 0.82 1.00  CALCIUM 10.1  --  9.1 8.9 8.9 8.8* 9.0  MG 1.9 2.0  --   --   --   --   --    GFR: Estimated Creatinine Clearance: 64.7 mL/min (by C-G formula based on SCr of 1 mg/dL). Liver Function Tests:  Recent Labs Lab 02/10/17 0616  AST 39  ALT 67*  ALKPHOS 53  BILITOT 1.4*  PROT 6.4*  ALBUMIN 3.7   No results for input(s): LIPASE, AMYLASE in the last 168 hours. No results for input(s): AMMONIA in the last 168  hours. Coagulation Profile:  Recent Labs Lab 02/09/17 1028  INR 1.15   Cardiac Enzymes:  Recent Labs Lab 02/09/17 1303 02/09/17 1815 02/10/17 0044 02/10/17 0616 02/10/17 1218  TROPONINI 2.36* 2.54* 2.26* 1.69* 1.11*   BNP (last 3 results) No results for input(s): PROBNP in the last 8760 hours. HbA1C: No results for input(s): HGBA1C in the last 72 hours. CBG: No results for input(s): GLUCAP in the last 168 hours. Lipid Profile: No results for input(s): CHOL, HDL, LDLCALC, TRIG, CHOLHDL, LDLDIRECT in the last 72 hours. Thyroid Function Tests: No results for input(s): TSH, T4TOTAL, FREET4, T3FREE, THYROIDAB in the last 72 hours. Anemia Panel: No results for input(s): VITAMINB12, FOLATE, FERRITIN, TIBC, IRON, RETICCTPCT in  the last 72 hours. Sepsis Labs: No results for input(s): PROCALCITON, LATICACIDVEN in the last 168 hours.  Recent Results (from the past 240 hour(s))  MRSA PCR Screening     Status: None   Collection Time: 02/09/17  6:23 PM  Result Value Ref Range Status   MRSA by PCR NEGATIVE NEGATIVE Final    Comment:        The GeneXpert MRSA Assay (FDA approved for NASAL specimens only), is one component of a comprehensive MRSA colonization surveillance program. It is not intended to diagnose MRSA infection nor to guide or monitor treatment for MRSA infections.          Radiology Studies: Nm Myocar Multi W/spect W/wall Motion / Ef  Result Date: 02/13/2017  There was no ST segment deviation noted during stress.  Findings consistent with prior inferior/inferolateral myocardial infarction with mild to moderate peri-infarct ischemia.  This is a high risk study. High risk based on decreased LVEF. Based on prior infarct and current level of ischemia alone this would sugest low to intermediate risk . Recommend correlate findings with echo  The left ventricular ejection fraction is moderately decreased (30-44%).    Dg Chest Port 1 View  Result Date:  02/12/2017 CLINICAL DATA:  82 y/o  M; shortness of breath. EXAM: PORTABLE CHEST 1 VIEW COMPARISON:  02/09/2017 chest radiograph FINDINGS: Right mid and lower lung zone consolidation. Small right pleural effusion. Stable cardiomegaly. Bones are unremarkable. IMPRESSION: Right mid and lower lung zone consolidation and small right pleural effusion suspicious for pneumonia. Stable cardiomegaly. Electronically Signed   By: Kristine Garbe M.D.   On: 02/12/2017 22:31        Scheduled Meds: . aspirin EC  81 mg Oral Daily  . atorvastatin  80 mg Oral q1800  . furosemide  60 mg Intravenous BID  . guaiFENesin  1,200 mg Oral BID  . hydrochlorothiazide  25 mg Oral Daily  . metoprolol tartrate  75 mg Oral BID  . off the beat book   Does not apply Once  . potassium chloride  40 mEq Oral Daily   Continuous Infusions: . sodium chloride Stopped (02/09/17 1800)  . heparin 1,700 Units/hr (02/14/17 1047)  . levofloxacin (LEVAQUIN) IV 750 mg (02/14/17 1430)     LOS: 5 days    Time spent: 8mins    Ivette Castronova, MD [facility] Pager [phone]  If 7PM-7AM, please contact night-coverage [email or web address] Password [email or web address] 02/14/2017, 4:40 PM

## 2017-02-14 NOTE — Progress Notes (Signed)
ANTICOAGULATION CONSULT NOTE - follow up  Pharmacy Consult for Heparin Indication: chest pain/ACS  Allergies  Allergen Reactions  . Penicillins     Has patient had a PCN reaction causing immediate rash, facial/tongue/throat swelling, SOB or lightheadedness with hypotension: Yes Has patient had a PCN reaction causing severe rash involving mucus membranes or skin necrosis: No Has patient had a PCN reaction that required hospitalization: No Has patient had a PCN reaction occurring within the last 10 years: No If all of the above answers are "NO", then may proceed with Cephalosporin use.   Patient Measurements: Height: 6' (182.9 cm) Weight: 196 lb 6.9 oz (89.1 kg) IBW/kg (Calculated) : 77.6 HEPARIN DW (KG): 93.4  Vital Signs: Temp: 98.4 F (36.9 C) (11/03 0514) Temp Source: Oral (11/03 0514) BP: 113/76 (11/03 0514) Pulse Rate: 72 (11/03 0514)  Labs:  Recent Labs  02/12/17 0415 02/13/17 0433 02/14/17 0657  HGB 11.2* 11.5* 11.6*  HCT 34.9* 34.9* 35.4*  PLT 242 274 278  HEPARINUNFRC 0.57 0.53 0.32  CREATININE 0.82 0.82  --     Estimated Creatinine Clearance: 78.9 mL/min (by C-G formula based on SCr of 0.82 mg/dL).   Medical History: Past Medical History:  Diagnosis Date  . Acute diastolic CHF (congestive heart failure) ([place]) 02/11/2017  . Hypertension   . Renal disorder    cyst on kidney   Medications:  Medications Prior to Admission  Medication Sig Dispense Refill  . aspirin 325 MG tablet Take 325 mg by mouth every 4 (four) hours as needed for mild pain, moderate pain or headache.    . Calcium Carbonate Antacid (ALKA-SELTZER ANTACID PO) Take 1 Dose by mouth daily as needed (indegestion).    Marland Kitchen losartan-hydrochlorothiazide (HYZAAR) 100-12.5 MG tablet Take 1 tablet by mouth daily.     Assessment: 82 yo male present to ED with tachycardia. No chest pain, no N/V. He had Elevated troponins.  Possible NSTEMI vs elevated troponin related to the stress of the tachycardia.  Cardizem ordered for rate control. Heparin level @ goal. No bleeding noted, CBC stable.  Goal of Therapy:  Heparin level 0.3-0.7 units/ml Monitor platelets by anticoagulation protocol: Yes   Plan:  Continue Heparin @ 1700 units/hr Daily heparin level  Monitor for bleeding complications  Beverlee Nims, [facility]  02/14/2017 9:10 AM

## 2017-02-15 ENCOUNTER — Other Ambulatory Visit: Payer: Self-pay

## 2017-02-15 LAB — BASIC METABOLIC PANEL
ANION GAP: 8 (ref 5–15)
BUN: 32 mg/dL — ABNORMAL HIGH (ref 6–20)
CALCIUM: 9.2 mg/dL (ref 8.9–10.3)
CO2: 33 mmol/L — AB (ref 22–32)
Chloride: 94 mmol/L — ABNORMAL LOW (ref 101–111)
Creatinine, Ser: 1.09 mg/dL (ref 0.61–1.24)
Glucose, Bld: 133 mg/dL — ABNORMAL HIGH (ref 65–99)
Potassium: 3.8 mmol/L (ref 3.5–5.1)
Sodium: 135 mmol/L (ref 135–145)

## 2017-02-15 LAB — HEPARIN LEVEL (UNFRACTIONATED): Heparin Unfractionated: 0.42 IU/mL (ref 0.30–0.70)

## 2017-02-15 LAB — CBC
HCT: 36.7 % — ABNORMAL LOW (ref 39.0–52.0)
Hemoglobin: 11.9 g/dL — ABNORMAL LOW (ref 13.0–17.0)
MCH: 30.7 pg (ref 26.0–34.0)
MCHC: 32.4 g/dL (ref 30.0–36.0)
MCV: 94.8 fL (ref 78.0–100.0)
PLATELETS: 277 10*3/uL (ref 150–400)
RBC: 3.87 MIL/uL — AB (ref 4.22–5.81)
RDW: 14.3 % (ref 11.5–15.5)
WBC: 8.6 10*3/uL (ref 4.0–10.5)

## 2017-02-15 NOTE — Progress Notes (Signed)
PROGRESS NOTE    Michael Stephens  WJX:[id number] DOB: 09/22/1934 DOA: 02/09/2017 PCP: Deloria Lair., MD    Brief Narrative:  82 year old male who presented to the hospital with palpitations and dyspnea on exertion.  He was found to be in decompensated CHF in rapid atrial flutter.  He was started on intravenous diltiazem and intravenous diuresis.  Cardiology has been following.   Assessment & Plan:   Active Problems:   Atrial flutter (HCC)   Essential hypertension   Demand ischemia (HCC)   Acute diastolic CHF (congestive heart failure) (HCC)   Acute respiratory failure with hypoxia (HCC)   1. Atrial flutter with rapid ventricular response.  Currently on oral metoprolol with fair heart rate control. He has been weaned off of cardizem infusion.  Chadsvasc of at least 3.  Currently on heparin infusion.  Will need to transition to oral anticoagulation once decision on invasive ischemic workup has been determined.   2. Demand ischemia.  Found to have elevated troponin of 2.5, trending down.  Felt to be demand ischemia in the setting of chronic obstructive coronary disease.  No wall motion abnormalities were noted on echocardiogram.  Stress test done this morning was found to be a low risk study. 3. Acute diastolic congestive heart failure.  Currently on IV Lasix. Overall volume status -4.4 L since admission, but this is likely inaccurate.  Weight on admission was 206 pounds.  Since admission, weight has trended down 15 pounds.  Overall volume status appears to be improving.  Renal function is stable with diuresis.  Continue current treatments. Continue to monitor intake and output. 4. Acute respiratory failure with hypoxia.  Related to heart failure/pneumonia.  Repeat chest x-ray shows some right middle/lower lobe consolidation.  Will try to wean off oxygen as tolerated. 5. Pneumonia, healthcare associated.  Continue on Levaquin.  Continue pulmonary hygiene with Mucinex and Atrovent  nebs. 6. Hypertension.  Blood pressures currently stable.  Continue on Lopressor   DVT prophylaxis: Heparin infusion Code Status: Full code Family Communication: Discussed with wife at the bedside Disposition Plan: Discharge home once improved   Consultants:   Cardiology  Procedures:  Echo:- Left ventricle: The cavity size was normal. Wall thickness was   increased in a pattern of mild LVH. Systolic function was normal.   The estimated ejection fraction was in the range of 50% to 55%.   Doppler parameters are consistent with restrictive physiology,   indicative of decreased left ventricular diastolic compliance   and/or increased left atrial pressure. Doppler parameters are   consistent with high ventricular filling pressure. - Aortic valve: Mildly calcified annulus. Trileaflet; mildly   thickened leaflets. There was very mild stenosis. Valve area   (VTI): 1.59 cm^2. Valve area (Vmax): 1.73 cm^2. - Left atrium: The atrium was severely dilated. - Right atrium: The atrium was mildly dilated. - Atrial septum: No defect or patent foramen ovale was identified. - Inferior vena cava: The vessel was dilated. The respirophasic   diameter changes were blunted (< 50%), consistent with elevated   central venous pressure.  - Technically difficult study.  Antimicrobials:   levaquin 11/2>   Subjective: Feeling better.  Shortness of breath improving.  No chest pain or palpitations.  Objective: Vitals:   02/14/17 2043 02/14/17 2120 02/15/17 0600 02/15/17 1448  BP:  (!) 104/53 110/60 119/74  Pulse:  66 80 94  Resp:  (!) 22 18 18   Temp:  98.6 F (37 C) 97.9 F (36.6 C) 97.7 F (36.5 C)  TempSrc:  Oral Oral Oral  SpO2: 94% 99% 95% 95%  Weight:   87 kg (191 lb 11.2 oz)   Height:        Intake/Output Summary (Last 24 hours) at 02/15/2017 1457 Last data filed at 02/15/2017 1217 Gross per 24 hour  Intake 720 ml  Output 1300 ml  Net -580 ml   Filed Weights   02/11/17 0600  02/12/17 0500 02/15/17 0600  Weight: 91.3 kg (201 lb 4.5 oz) 89.1 kg (196 lb 6.9 oz) 87 kg (191 lb 11.2 oz)    Examination:  General exam: Appears calm and comfortable  Respiratory system: Diminished breath sounds with crackles at the right base. Respiratory effort normal. Cardiovascular system: S1 & S2 heard, irregular, No JVD, murmurs, rubs, gallops or clicks. no pedal edema. Gastrointestinal system: Abdomen is nondistended, soft and nontender. No organomegaly or masses felt. Normal bowel sounds heard. Central nervous system: Alert and oriented. No focal neurological deficits. Extremities: Symmetric 5 x 5 power. Skin: No rashes, lesions or ulcers Psychiatry: Judgement and insight appear normal. Mood & affect appropriate.     Data Reviewed: I have personally reviewed following labs and imaging studies  CBC: Recent Labs  Lab 02/11/17 0538 02/12/17 0415 02/13/17 0433 02/14/17 0657 02/15/17 0649  WBC 9.3 8.1 8.1 8.2 8.6  HGB 10.9* 11.2* 11.5* 11.6* 11.9*  HCT 33.3* 34.9* 34.9* 35.4* 36.7*  MCV 95.1 97.2 94.3 94.4 94.8  PLT 222 242 274 278 675   Basic Metabolic Panel: Recent Labs  Lab 02/09/17 1028 02/10/17 0044  02/11/17 0538 02/12/17 0415 02/13/17 0433 02/14/17 1339 02/15/17 0649  NA 139  --    < > 138 136 136 134* 135  K 3.5  --    < > 3.2* 3.5 3.3* 3.6 3.8  CL 99*  --    < > 97* 93* 95* 94* 94*  CO2 31  --    < > 32 35* 32 32 33*  GLUCOSE 151*  --    < > 117* 98 92 171* 133*  BUN 35*  --    < > 35* 35* 32* 34* 32*  CREATININE 0.92  --    < > 0.77 0.82 0.82 1.00 1.09  CALCIUM 10.1  --    < > 8.9 8.9 8.8* 9.0 9.2  MG 1.9 2.0  --   --   --   --   --   --    < > = values in this interval not displayed.   GFR: Estimated Creatinine Clearance: 59.3 mL/min (by C-G formula based on SCr of 1.09 mg/dL). Liver Function Tests: Recent Labs  Lab 02/10/17 0616  AST 39  ALT 67*  ALKPHOS 53  BILITOT 1.4*  PROT 6.4*  ALBUMIN 3.7   No results for input(s): LIPASE,  AMYLASE in the last 168 hours. No results for input(s): AMMONIA in the last 168 hours. Coagulation Profile: Recent Labs  Lab 02/09/17 1028  INR 1.15   Cardiac Enzymes: Recent Labs  Lab 02/09/17 1303 02/09/17 1815 02/10/17 0044 02/10/17 0616 02/10/17 1218  TROPONINI 2.36* 2.54* 2.26* 1.69* 1.11*   BNP (last 3 results) No results for input(s): PROBNP in the last 8760 hours. HbA1C: No results for input(s): HGBA1C in the last 72 hours. CBG: No results for input(s): GLUCAP in the last 168 hours. Lipid Profile: No results for input(s): CHOL, HDL, LDLCALC, TRIG, CHOLHDL, LDLDIRECT in the last 72 hours. Thyroid Function Tests: No results for input(s): TSH, T4TOTAL, FREET4, T3FREE, THYROIDAB  in the last 72 hours. Anemia Panel: No results for input(s): VITAMINB12, FOLATE, FERRITIN, TIBC, IRON, RETICCTPCT in the last 72 hours. Sepsis Labs: No results for input(s): PROCALCITON, LATICACIDVEN in the last 168 hours.  Recent Results (from the past 240 hour(s))  MRSA PCR Screening     Status: None   Collection Time: 02/09/17  6:23 PM  Result Value Ref Range Status   MRSA by PCR NEGATIVE NEGATIVE Final    Comment:        The GeneXpert MRSA Assay (FDA approved for NASAL specimens only), is one component of a comprehensive MRSA colonization surveillance program. It is not intended to diagnose MRSA infection nor to guide or monitor treatment for MRSA infections.          Radiology Studies: No results found.      Scheduled Meds: . aspirin EC  81 mg Oral Daily  . atorvastatin  80 mg Oral q1800  . furosemide  60 mg Intravenous BID  . guaiFENesin  1,200 mg Oral BID  . hydrochlorothiazide  25 mg Oral Daily  . levofloxacin  750 mg Oral Daily  . metoprolol tartrate  75 mg Oral BID  . off the beat book   Does not apply Once  . potassium chloride  40 mEq Oral Daily   Continuous Infusions: . sodium chloride Stopped (02/09/17 1800)  . heparin 1,700 Units/hr (02/15/17 1217)      LOS: 6 days    Time spent: 28mins    MEMON,JEHANZEB, MD [facility] Pager [phone]  If 7PM-7AM, please contact night-coverage [email or web address] Password [email or web address] 02/15/2017, 2:57 PM

## 2017-02-15 NOTE — Progress Notes (Signed)
ANTICOAGULATION CONSULT NOTE - follow up  Pharmacy Consult for Heparin Indication: chest pain/ACS  Allergies  Allergen Reactions  . Penicillins     Has patient had a PCN reaction causing immediate rash, facial/tongue/throat swelling, SOB or lightheadedness with hypotension: Yes Has patient had a PCN reaction causing severe rash involving mucus membranes or skin necrosis: No Has patient had a PCN reaction that required hospitalization: No Has patient had a PCN reaction occurring within the last 10 years: No If all of the above answers are "NO", then may proceed with Cephalosporin use.   Patient Measurements: Height: 6' (182.9 cm) Weight: 191 lb 11.2 oz (87 kg) IBW/kg (Calculated) : 77.6 HEPARIN DW (KG): 93.4  Vital Signs: Temp: 97.9 F (36.6 C) (11/04 0600) Temp Source: Oral (11/04 0600) BP: 110/60 (11/04 0600) Pulse Rate: 80 (11/04 0600)  Labs: Recent Labs    02/13/17 0433 02/14/17 0657 02/14/17 1339 02/15/17 0649  HGB 11.5* 11.6*  --  11.9*  HCT 34.9* 35.4*  --  36.7*  PLT 274 278  --  277  HEPARINUNFRC 0.53 0.32  --  0.42  CREATININE 0.82  --  1.00 1.09    Estimated Creatinine Clearance: 59.3 mL/min (by C-G formula based on SCr of 1.09 mg/dL).   Medical History: Past Medical History:  Diagnosis Date  . Acute diastolic CHF (congestive heart failure) ([place]) 02/11/2017  . Hypertension   . Renal disorder    cyst on kidney   Medications:  Medications Prior to Admission  Medication Sig Dispense Refill  . aspirin 325 MG tablet Take 325 mg by mouth every 4 (four) hours as needed for mild pain, moderate pain or headache.    . Calcium Carbonate Antacid (ALKA-SELTZER ANTACID PO) Take 1 Dose by mouth daily as needed (indegestion).    Marland Kitchen losartan-hydrochlorothiazide (HYZAAR) 100-12.5 MG tablet Take 1 tablet by mouth daily.     Assessment: 82 yo male present to ED with tachycardia. No chest pain, no N/V. He had Elevated troponins.  Possible NSTEMI vs elevated troponin  related to the stress of the tachycardia. Cardizem ordered for rate control. Heparin level @ goal. No bleeding noted, CBC stable.  Goal of Therapy:  Heparin level 0.3-0.7 units/ml Monitor platelets by anticoagulation protocol: Yes   Plan:  Continue Heparin @ 1700 units/hr Daily heparin level  Monitor for bleeding complications  Beverlee Nims, [facility]  02/15/2017 8:35 AM

## 2017-02-16 DIAGNOSIS — I21A1 Myocardial infarction type 2: Secondary | ICD-10-CM

## 2017-02-16 DIAGNOSIS — J189 Pneumonia, unspecified organism: Secondary | ICD-10-CM | POA: Diagnosis present

## 2017-02-16 DIAGNOSIS — I4891 Unspecified atrial fibrillation: Secondary | ICD-10-CM

## 2017-02-16 LAB — BASIC METABOLIC PANEL
ANION GAP: 8 (ref 5–15)
BUN: 35 mg/dL — ABNORMAL HIGH (ref 6–20)
CO2: 32 mmol/L (ref 22–32)
Calcium: 9.2 mg/dL (ref 8.9–10.3)
Chloride: 93 mmol/L — ABNORMAL LOW (ref 101–111)
Creatinine, Ser: 1 mg/dL (ref 0.61–1.24)
GLUCOSE: 165 mg/dL — AB (ref 65–99)
POTASSIUM: 3.5 mmol/L (ref 3.5–5.1)
SODIUM: 133 mmol/L — AB (ref 135–145)

## 2017-02-16 LAB — HEPARIN LEVEL (UNFRACTIONATED): Heparin Unfractionated: 0.51 IU/mL (ref 0.30–0.70)

## 2017-02-16 MED ORDER — LEVOFLOXACIN 750 MG PO TABS: 750.0000 mg | ORAL_TABLET | Freq: Every day | ORAL | 0 refills | Status: DC

## 2017-02-16 MED ORDER — HYDROCHLOROTHIAZIDE 25 MG PO TABS: 25.0000 mg | ORAL_TABLET | Freq: Every day | ORAL | 0 refills | Status: DC

## 2017-02-16 MED ORDER — GUAIFENESIN ER 600 MG PO TB12
600.0000 mg | ORAL_TABLET | Freq: Two times a day (BID) | ORAL | 0 refills | Status: DC
Start: 2017-02-16 — End: 2017-03-02

## 2017-02-16 MED ORDER — APIXABAN 5 MG PO TABS: 5.0000 mg | ORAL_TABLET | Freq: Two times a day (BID) | ORAL | 0 refills | Status: DC

## 2017-02-16 MED ORDER — APIXABAN 5 MG PO TABS
5.0000 mg | ORAL_TABLET | Freq: Two times a day (BID) | ORAL | Status: DC
  Administered 2017-02-16: 5 mg via ORAL
  Filled 2017-02-16: qty 1

## 2017-02-16 MED ORDER — METOPROLOL TARTRATE 75 MG PO TABS: 75.0000 mg | ORAL_TABLET | Freq: Two times a day (BID) | ORAL | 0 refills | Status: DC

## 2017-02-16 MED ORDER — ATORVASTATIN CALCIUM 80 MG PO TABS: 80.0000 mg | ORAL_TABLET | Freq: Every day | ORAL | 0 refills | Status: DC

## 2017-02-16 NOTE — Progress Notes (Signed)
Progress Note  Patient Name: Michael Stephens Date of Encounter: 02/16/2017  Consulting Cardiologist: Dr. Dorris Carnes  Subjective   No chest pain or shortness of breath at rest.  Improved palpitations.  Has ambulated in the halls.  Inpatient Medications    Scheduled Meds: . aspirin EC  81 mg Oral Daily  . atorvastatin  80 mg Oral q1800  . furosemide  60 mg Intravenous BID  . guaiFENesin  1,200 mg Oral BID  . hydrochlorothiazide  25 mg Oral Daily  . levofloxacin  750 mg Oral Daily  . metoprolol tartrate  75 mg Oral BID  . off the beat book   Does not apply Once  . potassium chloride  40 mEq Oral Daily   Continuous Infusions: . sodium chloride Stopped (02/09/17 1800)  . heparin 1,700 Units/hr (02/15/17 2049)   PRN Meds: sodium chloride, acetaminophen **OR** acetaminophen, alum & mag hydroxide-simeth, ipratropium, levalbuterol, ondansetron **OR** ondansetron (ZOFRAN) IV, sodium chloride, sodium chloride flush   Vital Signs    Vitals:   02/15/17 1448 02/15/17 2300 02/16/17 0449 02/16/17 0458  BP: 119/74 100/70  (!) 112/58  Pulse: 94 80  78  Resp: 18 16  18   Temp: 97.7 F (36.5 C) 97.9 F (36.6 C)  97.7 F (36.5 C)  TempSrc: Oral Oral  Oral  SpO2: 95% 96%  98%  Weight:   190 lb 8 oz (86.4 kg)   Height:        Intake/Output Summary (Last 24 hours) at 02/16/2017 1035 Last data filed at 02/16/2017 0900 Gross per 24 hour  Intake 480 ml  Output 1600 ml  Net -1120 ml   Filed Weights   02/12/17 0500 02/15/17 0600 02/16/17 0449  Weight: 196 lb 6.9 oz (89.1 kg) 191 lb 11.2 oz (87 kg) 190 lb 8 oz (86.4 kg)    Telemetry    Rate controlled atrial fibrillation.  Personally reviewed.  Physical Exam   GEN:  Elderly male.  No acute distress.   Neck: No JVD. Cardiac:  Irregularly irregular, soft systolic murmur,, no gallop.  Respiratory: Nonlabored. Clear to auscultation bilaterally. GI: Soft, nontender, bowel sounds present. MS: No edema; No deformity. Neuro:   Nonfocal. Psych: Alert and oriented x 3. Normal affect.  Labs    Chemistry Recent Labs  Lab 02/10/17 [phone]  02/14/17 1339 02/15/17 0649 02/16/17 0404  NA 134*   < > 134* 135 133*  K 3.4*   < > 3.6 3.8 3.5  CL 96*   < > 94* 94* 93*  CO2 30   < > 32 33* 32  GLUCOSE 117*   < > 171* 133* 165*  BUN 39*   < > 34* 32* 35*  CREATININE 0.90   < > 1.00 1.09 1.00  CALCIUM 9.1   < > 9.0 9.2 9.2  PROT 6.4*  --   --   --   --   ALBUMIN 3.7  --   --   --   --   AST 39  --   --   --   --   ALT 67*  --   --   --   --   ALKPHOS 53  --   --   --   --   BILITOT 1.4*  --   --   --   --   GFRNONAA >60   < > >60 >60 >60  GFRAA >60   < > >60 >60 >60  ANIONGAP 8   < >  8 8 8    < > = values in this interval not displayed.     Hematology Recent Labs  Lab 02/13/17 0433 02/14/17 0657 02/15/17 0649  WBC 8.1 8.2 8.6  RBC 3.70* 3.75* 3.87*  HGB 11.5* 11.6* 11.9*  HCT 34.9* 35.4* 36.7*  MCV 94.3 94.4 94.8  MCH 31.1 30.9 30.7  MCHC 33.0 32.8 32.4  RDW 14.3 14.3 14.3  PLT 274 278 277    Cardiac Enzymes Recent Labs  Lab 02/09/17 1815 02/10/17 0044 02/10/17 0616 02/10/17 1218  TROPONINI 2.54* 2.26* 1.69* 1.11*   No results for input(s): TROPIPOC in the last 168 hours.   BNP Recent Labs  Lab 02/10/17 0619  BNP 212.0*     Radiology    No results found.  Cardiac Studies   Echocardiogram 02/10/2017: Study Conclusions  - Left ventricle: The cavity size was normal. Wall thickness was   increased in a pattern of mild LVH. Systolic function was normal.   The estimated ejection fraction was in the range of 50% to 55%.   Doppler parameters are consistent with restrictive physiology,   indicative of decreased left ventricular diastolic compliance   and/or increased left atrial pressure. Doppler parameters are   consistent with high ventricular filling pressure. - Aortic valve: Mildly calcified annulus. Trileaflet; mildly   thickened leaflets. There was very mild stenosis. Valve  area   (VTI): 1.59 cm^2. Valve area (Vmax): 1.73 cm^2. - Left atrium: The atrium was severely dilated. - Right atrium: The atrium was mildly dilated. - Atrial septum: No defect or patent foramen ovale was identified. - Inferior vena cava: The vessel was dilated. The respirophasic   diameter changes were blunted (< 50%), consistent with elevated   central venous pressure. - Technically difficult study.  Lexiscan Myoview 02/13/2017:  There was no ST segment deviation noted during stress.  Findings consistent with prior inferior/inferolateral myocardial infarction with mild to moderate peri-infarct ischemia.  This is a high risk study. High risk based on decreased LVEF. Based on prior infarct and current level of ischemia alone this would sugest low to intermediate risk . Recommend correlate findings with echo  The left ventricular ejection fraction is moderately decreased (30-44%).  Patient Profile     82 y.o. male admitted to the hospital with recently documented rapid atrial fibrillation associated with shortness of breath.  Troponin I levels are abnormal, most likely reflective of type II NSTEMI.  LVEF is 50-55% by echocardiogram with Myoview indicating region of inferior/inferolateral scar with mild to moderate peri-infarct ischemia.  He reports no chest pain with adequate heart rate control following medication adjustments. CHADSVASC score is 3.  Assessment & Plan    1.  Atrial fibrillation, presenting with shortness of breath and RVR, now with better heart rate control following medication adjustments.  CHADSVASC score is 3.  2.  NSTEMI, likely type II event in the setting of problem #1 and most likely chronic CAD at baseline.  Myoview study does show region of inferior/inferolateral scar with mild to moderate peri-infarct ischemia.  LVEF 50-55% by echocardiogram.  3.  Essential hypertension.  Discussed with patient and wife.  He would like to go home.  He has ambulated in the hall  and reports no angina symptoms.  Recommend conversion from IV heparin to Eliquis 5 mg twice daily for stroke prophylaxis with CHADSVASC score of 3.  Discontinue aspirin.  Continue Lipitor, Lopressor, and HCTZ.  Would not discharge on Lasix at this point.  He lives in [place],  would like to follow-up in our [facility], therefore please schedule office follow-up visit with Dr. Harl Bowie (saw patient on 02/13/2017) instead of Dr. Harrington Challenger (please call [phone]).  Signed, Rozann Lesches, MD  02/16/2017, 10:35 AM

## 2017-02-16 NOTE — Discharge Summary (Addendum)
Physician Discharge Summary  Michael Stephens ERD:[id number] DOB: [birth year] DOA: 02/09/2017  PCP: Deloria Lair., MD  Admit date: 02/09/2017 Discharge date: 02/16/2017  Admitted From: home Disposition:  home  Recommendations for Outpatient Follow-up:  1. Follow up with PCP in 1-2 weeks 2. Please obtain BMP/CBC in one week 3. Follow up with cardiology as an outpatient 4. Repeat chest xray in 4-6 weeks to ensure resolution  Home Health: Equipment/Devices:  Discharge Condition: stable CODE STATUS: full code Diet recommendation: Heart Healthy   Brief/Interim Summary: 82 year old male who presented to the hospital with palpitations and dyspnea on exertion.  He was found to be in decompensated CHF in rapid atrial flutter.  He was started on intravenous diltiazem and intravenous diuresis.  Discharge Diagnoses:  Active Problems:   Atrial flutter (HCC)   Essential hypertension   Demand ischemia (HCC)   Acute diastolic CHF (congestive heart failure) (HCC)   Acute respiratory failure with hypoxia (HCC)   CAP (community acquired pneumonia)  1. Atrial flutter with rapid ventricular response. Patient treated with intravenous diltiazem and oral metoprolol and achieved fair rate control. He has been weaned off of diltiazem infusion CHADSVASc of 3. He is anticoagulated with eliquis.   2. Demand ischemia.  Found to have elevated troponin of 2.5, trending down.  Felt to be demand ischemia in the setting of chronic obstructive coronary disease.  No wall motion abnormalities were noted on echocardiogram.  Stress test was found to be a low risk study. 3. Acute diastolic congestive heart failure.  Treated with IV Lasix. Overall volume status -5.2 L since admission, but this is likely inaccurate.  Weight on admission was 206 pounds.  Since admission, weight has trended down 16 pounds.  Overall volume status appears to be improved.  Renal function is stable with diuresis.  Continue current treatments.  Continue to monitor intake and output. Per cardiology, will not discharge on any lasix. Will continue with HCTZ, lopressor and lipitor 4. Acute respiratory failure with hypoxia.  Related to heart failure/pneumonia.  Repeat chest x-ray shows some right middle/lower lobe consolidation. He has been weaned off of oxygen and is breathing comfortably on room air 5. Pneumonia, healthcare associated.  Continue on Levaquin.  Continue pulmonary hygiene with Mucinex and Atrovent nebs. 6. Hypertension.  Blood pressures currently stable.  Continue on Lopressor     Discharge Instructions  Discharge Instructions    Amb referral to [facility]   Complete by:  As directed    Diet - low sodium heart healthy   Complete by:  As directed    Increase activity slowly   Complete by:  As directed      Allergies as of 02/16/2017      Reactions   Penicillins    Has patient had a PCN reaction causing immediate rash, facial/tongue/throat swelling, SOB or lightheadedness with hypotension: Yes Has patient had a PCN reaction causing severe rash involving mucus membranes or skin necrosis: No Has patient had a PCN reaction that required hospitalization: No Has patient had a PCN reaction occurring within the last 10 years: No If all of the above answers are "NO", then may proceed with Cephalosporin use.      Medication List    STOP taking these medications   aspirin 325 MG tablet   losartan-hydrochlorothiazide 100-12.5 MG tablet Commonly known as:  HYZAAR     TAKE these medications   ALKA-SELTZER ANTACID PO Take 1 Dose by mouth daily as needed (indegestion).   apixaban 5 MG Tabs  tablet Commonly known as:  ELIQUIS Take 1 tablet (5 mg total) 2 (two) times daily by mouth.   atorvastatin 80 MG tablet Commonly known as:  LIPITOR Take 1 tablet (80 mg total) daily at 6 PM by mouth.   guaiFENesin 600 MG 12 hr tablet Commonly known as:  MUCINEX Take 1 tablet (600 mg total) 2 (two) times daily by mouth.    hydrochlorothiazide 25 MG tablet Commonly known as:  HYDRODIURIL Take 1 tablet (25 mg total) daily by mouth. Start taking on:  02/17/2017   levofloxacin 750 MG tablet Commonly known as:  LEVAQUIN Take 1 tablet (750 mg total) daily by mouth. Start taking on:  02/17/2017   Metoprolol Tartrate 75 MG Tabs Take 75 mg 2 (two) times daily by mouth.            Durable Medical Equipment  (From admission, onward)        Start     Ordered   02/13/17 1531  For home use only DME Cane  Once     02/13/17 1530      Allergies  Allergen Reactions  . Penicillins     Has patient had a PCN reaction causing immediate rash, facial/tongue/throat swelling, SOB or lightheadedness with hypotension: Yes Has patient had a PCN reaction causing severe rash involving mucus membranes or skin necrosis: No Has patient had a PCN reaction that required hospitalization: No Has patient had a PCN reaction occurring within the last 10 years: No If all of the above answers are "NO", then may proceed with Cephalosporin use.    Consultations:  cardiology   Procedures/Studies: Dg Chest 2 View  Result Date: 02/09/2017 CLINICAL DATA:  Shortness of breath. EXAM: CHEST  2 VIEW COMPARISON:  None. FINDINGS: Cardiomegaly. Increased pulmonary vascularity and interstitial markings. Trace bilateral pleural effusions. Low lung volumes with bibasilar atelectasis. No pneumothorax. No acute osseous abnormality. IMPRESSION: Cardiomegaly with trace bilateral pleural effusions and pulmonary interstitial edema. Electronically Signed   By: Titus Dubin M.D.   On: 02/09/2017 12:29   Nm Myocar Multi W/spect W/wall Motion / Ef  Result Date: 02/13/2017  There was no ST segment deviation noted during stress.  Findings consistent with prior inferior/inferolateral myocardial infarction with mild to moderate peri-infarct ischemia.  This is a high risk study. High risk based on decreased LVEF. Based on prior infarct and current  level of ischemia alone this would sugest low to intermediate risk . Recommend correlate findings with echo  The left ventricular ejection fraction is moderately decreased (30-44%).    Dg Chest Port 1 View  Result Date: 02/12/2017 CLINICAL DATA:  82 y/o  M; shortness of breath. EXAM: PORTABLE CHEST 1 VIEW COMPARISON:  02/09/2017 chest radiograph FINDINGS: Right mid and lower lung zone consolidation. Small right pleural effusion. Stable cardiomegaly. Bones are unremarkable. IMPRESSION: Right mid and lower lung zone consolidation and small right pleural effusion suspicious for pneumonia. Stable cardiomegaly. Electronically Signed   By: Kristine Garbe M.D.   On: 02/12/2017 22:31    Echo: - Left ventricle: The cavity size was normal. Wall thickness was   increased in a pattern of mild LVH. Systolic function was normal.   The estimated ejection fraction was in the range of 50% to 55%.   Doppler parameters are consistent with restrictive physiology,   indicative of decreased left ventricular diastolic compliance   and/or increased left atrial pressure. Doppler parameters are   consistent with high ventricular filling pressure. - Aortic valve: Mildly calcified annulus. Trileaflet;  mildly   thickened leaflets. There was very mild stenosis. Valve area   (VTI): 1.59 cm^2. Valve area (Vmax): 1.73 cm^2. - Left atrium: The atrium was severely dilated. - Right atrium: The atrium was mildly dilated. - Atrial septum: No defect or patent foramen ovale was identified. - Inferior vena cava: The vessel was dilated. The respirophasic   diameter changes were blunted (< 50%), consistent with elevated   central venous pressure. - Technically difficult study.  Subjective: Feeling better today. No shortness of breath or cough. No chest pain  Discharge Exam: Vitals:   02/15/17 2300 02/16/17 0458  BP: 100/70 (!) 112/58  Pulse: 80 78  Resp: 16 18  Temp: 97.9 F (36.6 C) 97.7 F (36.5 C)  SpO2: 96%  98%   Vitals:   02/15/17 1448 02/15/17 2300 02/16/17 0449 02/16/17 0458  BP: 119/74 100/70  (!) 112/58  Pulse: 94 80  78  Resp: 18 16  18   Temp: 97.7 F (36.5 C) 97.9 F (36.6 C)  97.7 F (36.5 C)  TempSrc: Oral Oral  Oral  SpO2: 95% 96%  98%  Weight:   86.4 kg (190 lb 8 oz)   Height:        General: Pt is alert, awake, not in acute distress Cardiovascular: irregular, S1/S2 +, no rubs, no gallops Respiratory: CTA bilaterally, no wheezing, no rhonchi Abdominal: Soft, NT, ND, bowel sounds + Extremities: no edema, no cyanosis    The results of significant diagnostics from this hospitalization (including imaging, microbiology, ancillary and laboratory) are listed below for reference.     Microbiology: Recent Results (from the past 240 hour(s))  MRSA PCR Screening     Status: None   Collection Time: 02/09/17  6:23 PM  Result Value Ref Range Status   MRSA by PCR NEGATIVE NEGATIVE Final    Comment:        The GeneXpert MRSA Assay (FDA approved for NASAL specimens only), is one component of a comprehensive MRSA colonization surveillance program. It is not intended to diagnose MRSA infection nor to guide or monitor treatment for MRSA infections.      Labs: BNP (last 3 results) Recent Labs    02/10/17 0619  BNP 580.9*   Basic Metabolic Panel: Recent Labs  Lab 02/10/17 0044  02/12/17 0415 02/13/17 0433 02/14/17 1339 02/15/17 0649 02/16/17 0404  NA  --    < > 136 136 134* 135 133*  K  --    < > 3.5 3.3* 3.6 3.8 3.5  CL  --    < > 93* 95* 94* 94* 93*  CO2  --    < > 35* 32 32 33* 32  GLUCOSE  --    < > 98 92 171* 133* 165*  BUN  --    < > 35* 32* 34* 32* 35*  CREATININE  --    < > 0.82 0.82 1.00 1.09 1.00  CALCIUM  --    < > 8.9 8.8* 9.0 9.2 9.2  MG 2.0  --   --   --   --   --   --    < > = values in this interval not displayed.   Liver Function Tests: Recent Labs  Lab 02/10/17 0616  AST 39  ALT 67*  ALKPHOS 53  BILITOT 1.4*  PROT 6.4*  ALBUMIN  3.7   No results for input(s): LIPASE, AMYLASE in the last 168 hours. No results for input(s): AMMONIA in the last 168 hours. CBC:  Recent Labs  Lab 02/11/17 0538 02/12/17 0415 02/13/17 0433 02/14/17 0657 02/15/17 0649  WBC 9.3 8.1 8.1 8.2 8.6  HGB 10.9* 11.2* 11.5* 11.6* 11.9*  HCT 33.3* 34.9* 34.9* 35.4* 36.7*  MCV 95.1 97.2 94.3 94.4 94.8  PLT 222 242 274 278 277   Cardiac Enzymes: Recent Labs  Lab 02/09/17 1303 02/09/17 1815 02/10/17 0044 02/10/17 0616 02/10/17 1218  TROPONINI 2.36* 2.54* 2.26* 1.69* 1.11*   BNP: Invalid input(s): POCBNP CBG: No results for input(s): GLUCAP in the last 168 hours. D-Dimer No results for input(s): DDIMER in the last 72 hours. Hgb A1c No results for input(s): HGBA1C in the last 72 hours. Lipid Profile No results for input(s): CHOL, HDL, LDLCALC, TRIG, CHOLHDL, LDLDIRECT in the last 72 hours. Thyroid function studies No results for input(s): TSH, T4TOTAL, T3FREE, THYROIDAB in the last 72 hours.  Invalid input(s): FREET3 Anemia work up No results for input(s): VITAMINB12, FOLATE, FERRITIN, TIBC, IRON, RETICCTPCT in the last 72 hours. Urinalysis No results found for: COLORURINE, APPEARANCEUR, [place], [place], [place], [place], [place], [place], PROTEINUR, UROBILINOGEN, NITRITE, LEUKOCYTESUR Sepsis Labs Invalid input(s): PROCALCITONIN,  WBC,  LACTICIDVEN Microbiology Recent Results (from the past 240 hour(s))  MRSA PCR Screening     Status: None   Collection Time: 02/09/17  6:23 PM  Result Value Ref Range Status   MRSA by PCR NEGATIVE NEGATIVE Final    Comment:        The GeneXpert MRSA Assay (FDA approved for NASAL specimens only), is one component of a comprehensive MRSA colonization surveillance program. It is not intended to diagnose MRSA infection nor to guide or monitor treatment for MRSA infections.      Time coordinating discharge: Over 30 minutes  SIGNED:   Kathie Dike, MD  [facility] 02/16/2017, 11:11 AM Pager   If 7PM-7AM, please contact night-coverage [email or web address] Password [email or web address]

## 2017-02-16 NOTE — Care Management Important Message (Signed)
Important Message  Patient Details  Name: Michael Stephens MRN: [id number] Date of Birth: [birth year]   Medicare Important Message Given:  Yes    Sherald Barge, RN 02/16/2017, 11:31 AM

## 2017-02-16 NOTE — Progress Notes (Signed)
D/C home via w/c with spouse at bedside.  All d/c paperwork reviewed with pt.  F/U appt scheduled with cardiology.  Pt aware.  All prescriptions reviewed.  In stable condition.

## 2017-02-16 NOTE — Discharge Instructions (Addendum)

## 2017-02-16 NOTE — Progress Notes (Signed)
ANTICOAGULATION CONSULT NOTE - follow up  Pharmacy Consult for Heparin Indication: chest pain/ACS  Allergies  Allergen Reactions  . Penicillins     Has patient had a PCN reaction causing immediate rash, facial/tongue/throat swelling, SOB or lightheadedness with hypotension: Yes Has patient had a PCN reaction causing severe rash involving mucus membranes or skin necrosis: No Has patient had a PCN reaction that required hospitalization: No Has patient had a PCN reaction occurring within the last 10 years: No If all of the above answers are "NO", then may proceed with Cephalosporin use.   Patient Measurements: Height: 6' (182.9 cm) Weight: 190 lb 8 oz (86.4 kg) IBW/kg (Calculated) : 77.6 HEPARIN DW (KG): 93.4  Vital Signs: Temp: 97.7 F (36.5 C) (11/05 0458) Temp Source: Oral (11/05 0458) BP: 112/58 (11/05 0458) Pulse Rate: 78 (11/05 0458)  Labs: Recent Labs    02/14/17 0657 02/14/17 1339 02/15/17 0649 02/16/17 0404  HGB 11.6*  --  11.9*  --   HCT 35.4*  --  36.7*  --   PLT 278  --  277  --   HEPARINUNFRC 0.32  --  0.42 0.51  CREATININE  --  1.00 1.09 1.00    Estimated Creatinine Clearance: 64.7 mL/min (by C-G formula based on SCr of 1 mg/dL).   Medical History: Past Medical History:  Diagnosis Date  . Acute diastolic CHF (congestive heart failure) ([place]) 02/11/2017  . Hypertension   . Renal disorder    cyst on kidney   Medications:  Medications Prior to Admission  Medication Sig Dispense Refill  . aspirin 325 MG tablet Take 325 mg by mouth every 4 (four) hours as needed for mild pain, moderate pain or headache.    . Calcium Carbonate Antacid (ALKA-SELTZER ANTACID PO) Take 1 Dose by mouth daily as needed (indegestion).    Marland Kitchen losartan-hydrochlorothiazide (HYZAAR) 100-12.5 MG tablet Take 1 tablet by mouth daily.     Assessment: 82 yo male present to ED with tachycardia. No chest pain, no N/V. He had Elevated troponins.  Possible NSTEMI vs elevated troponin related  to the stress of the tachycardia. Cardizem ordered for rate control. Heparin level @ goal. No bleeding noted, CBC stable.  Goal of Therapy:  Heparin level 0.3-0.7 units/ml Monitor platelets by anticoagulation protocol: Yes   Plan:  Continue Heparin @ 1700 units/hr Daily heparin level  Monitor for bleeding complications  Pricilla Larsson, [facility]  02/16/2017 8:54 AM

## 2017-02-16 NOTE — Care Management Note (Signed)
Case Management Note  Patient Details  Name: Michael Stephens MRN: [id number] Date of Birth: 10-01-1934  Expected Discharge Date:  02/16/17               Expected Discharge Plan:  Home/Self Care  In-House Referral:  NA  Discharge planning Services  CM Consult, Medication Assistance  Status of Service:  Completed, signed off   Additional Comments: Discharging home today with self care. Pt refused cane on Friday, still does not want one. He will DC on Eliquis, given 30-day voucher. Wife at bedside, will provide transportation home.   Sherald Barge, RN 02/16/2017, 11:32 AM

## 2017-02-20 DIAGNOSIS — Z23 Encounter for immunization: Secondary | ICD-10-CM | POA: Diagnosis not present

## 2017-03-01 NOTE — Progress Notes (Signed)
Cardiology Office Note    Date:  03/02/2017   ID:  Michael Stephens, DOB 1934-11-15, MRN [id number]  PCP:  Deloria Lair., MD  Cardiologist: Wishes to Follow with Dr. Harl Bowie  Chief Complaint  Patient presents with  . Hospitalization Follow-up    History of Present Illness:    Michael Stephens is a 82 y.o. male with past medical history of HTN who presents to the office today for hospital follow-up.   He was recently admitted to [facility] from 10/29 - 02/16/2017 for evaluation of palpitations and dyspnea on exertion, found to be in atrial flutter with RVR. He was initially started on IV Cardizem for improved rate-control. TSH was found to be elevated to 5.714 but a full thyroid panel was not obtained.  An echocardiogram was performed and showed a preserved EF of 50-55% with mild AS and severely dilated LA. Due to his cyclic troponin values trending up to 2.54, a NST was performed for ischemic evaluation which showed prior inferior/inferolateral myocardial infarction with mild to moderate peri-infarct ischemia. The EF was read as being decreased to 30-44% but was again preserved on echo. With only mild ischemia noted, further ischemic evaluation was not pursued at that time. He was transitioned from Heparin to Eliquis 5mg  BID along with Lopressor 75 mg BID for rate-control.   In talking with the patient today, he reports doing well from a cardiac perspective since his recent hospitalization. He does note occasional episodes of palpitations when carrying chicken eggs up a hill but no persistent palpitations. Denies any palpitations at rest. No chest discomfort or dyspnea on exertion. He denies any recent orthopnea, PND, or lower extremity edema.  His main concern today is that he is having intermittent episodes of diarrhea and abdominal pain for the past several weeks. He was on Levaquin for two days following his hospitalization and has been off of antibiotic therapy since. He denies any recent  melena, hematochezia, fevers, chills, or weight loss. Has never undergone a Colonoscopy.    Past Medical History:  Diagnosis Date  . Acute diastolic CHF (congestive heart failure) ([place]) 02/11/2017  . Atrial flutter ([place])    a. diagnosed in 02/2017. Rate-control strategy pursued.   . Hypertension   . Renal disorder    cyst on kidney    History reviewed. No pertinent surgical history.  Current Medications: Outpatient Medications Prior to Visit  Medication Sig Dispense Refill  . Calcium Carbonate Antacid (ALKA-SELTZER ANTACID PO) Take 1 Dose by mouth daily as needed (indegestion).    Marland Kitchen apixaban (ELIQUIS) 5 MG TABS tablet Take 1 tablet (5 mg total) 2 (two) times daily by mouth. 60 tablet 0  . atorvastatin (LIPITOR) 80 MG tablet Take 1 tablet (80 mg total) daily at 6 PM by mouth. 30 tablet 0  . hydrochlorothiazide (HYDRODIURIL) 25 MG tablet Take 1 tablet (25 mg total) daily by mouth. 30 tablet 0  . levofloxacin (LEVAQUIN) 750 MG tablet Take 1 tablet (750 mg total) daily by mouth. 2 tablet 0  . Metoprolol Tartrate 75 MG TABS Take 75 mg 2 (two) times daily by mouth. 60 tablet 0  . guaiFENesin (MUCINEX) 600 MG 12 hr tablet Take 1 tablet (600 mg total) 2 (two) times daily by mouth. 30 tablet 0   No facility-administered medications prior to visit.      Allergies:   Penicillins   Social History   Socioeconomic History  . Marital status: Married    Spouse name: None  . Number of  children: None  . Years of education: None  . Highest education level: None  Social Needs  . Financial resource strain: None  . Food insecurity - worry: None  . Food insecurity - inability: None  . Transportation needs - medical: None  . Transportation needs - non-medical: None  Occupational History  . None  Tobacco Use  . Smoking status: Former Smoker    Last attempt to quit: 2006    Years since quitting: 12.8  . Smokeless tobacco: Never Used  Substance and Sexual Activity  . Alcohol use: No  .  Drug use: No  . Sexual activity: No  Other Topics Concern  . None  Social History Narrative  . None     Family History:  The patient's family history includes CAD in his father; CVA in his father; Diabetes in his sister.   Review of Systems:   Please see the history of present illness.     General:  No chills, fever, night sweats or weight changes.  Cardiovascular:  No chest pain, dyspnea on exertion, edema, orthopnea, paroxysmal nocturnal dyspnea. Positive for palpitations.  Dermatological: No rash, lesions/masses Respiratory: No cough, dyspnea Urologic: No hematuria, dysuria Abdominal:   No nausea, vomiting, bright red blood per rectum, melena, or hematemesis. Positive for diarrhea and abdominal pain.  Neurologic:  No visual changes, wkns, changes in mental status. All other systems reviewed and are otherwise negative except as noted above.   Physical Exam:    VS:  BP 134/76   Pulse 72   Ht 6' (1.829 m)   Wt 198 lb 9.6 oz (90.1 kg)   SpO2 98%   BMI 26.94 kg/m    General: Well developed, well nourished Caucasian male appearing in no acute distress. Head: Normocephalic, atraumatic, sclera non-icteric, no xanthomas, nares are without discharge.  Neck: No carotid bruits. JVD not elevated.  Lungs: Respirations regular and unlabored, without wheezes or rales.  Heart: Irregularly irregular. No S3 or S4.  No rubs or gallops appreciated. 2/6 SEM along RUSB.  Abdomen: Soft, non-tender, non-distended with normoactive bowel sounds. No hepatomegaly. No rebound/guarding. No obvious abdominal masses. Msk:  Strength and tone appear normal for age. No joint deformities or effusions. Extremities: No clubbing or cyanosis. No lower extremity edema.  Distal pedal pulses are 2+ bilaterally. Neuro: Alert and oriented X 3. Moves all extremities spontaneously. No focal deficits noted. Psych:  Responds to questions appropriately with a normal affect. Skin: No rashes or lesions noted  Wt Readings  from Last 3 Encounters:  03/02/17 198 lb 9.6 oz (90.1 kg)  02/16/17 190 lb 8 oz (86.4 kg)     Studies/Labs Reviewed:   EKG:  EKG is ordered today.  The ekg ordered today demonstrates atrial flutter, HR 100. No acute ST or T-wave changes when compared to prior tracings.   Recent Labs: 02/09/2017: TSH 5.714 02/10/2017: ALT 67; B Natriuretic Peptide 212.0; Magnesium 2.0 02/15/2017: Hemoglobin 11.9; Platelets 277 02/16/2017: BUN 35; Creatinine, Ser 1.00; Potassium 3.5; Sodium 133   Lipid Panel    Component Value Date/Time   CHOL 145 02/09/2017 1303   TRIG 57 02/09/2017 1303   HDL 44 02/09/2017 1303   CHOLHDL 3.3 02/09/2017 1303   VLDL 11 02/09/2017 1303   LDLCALC 90 02/09/2017 1303    Additional studies/ records that were reviewed today include:   Echocardiogram: 02/10/2017 Study Conclusions  - Left ventricle: The cavity size was normal. Wall thickness was   increased in a pattern of mild LVH. Systolic  function was normal.   The estimated ejection fraction was in the range of 50% to 55%.   Doppler parameters are consistent with restrictive physiology,   indicative of decreased left ventricular diastolic compliance   and/or increased left atrial pressure. Doppler parameters are   consistent with high ventricular filling pressure. - Aortic valve: Mildly calcified annulus. Trileaflet; mildly   thickened leaflets. There was very mild stenosis. Valve area   (VTI): 1.59 cm^2. Valve area (Vmax): 1.73 cm^2. - Left atrium: The atrium was severely dilated. - Right atrium: The atrium was mildly dilated. - Atrial septum: No defect or patent foramen ovale was identified. - Inferior vena cava: The vessel was dilated. The respirophasic   diameter changes were blunted (< 50%), consistent with elevated   central venous pressure. - Technically difficult study.   NST: 02/13/2017 There was no ST segment deviation noted during stress.  Findings consistent with prior inferior/inferolateral  myocardial infarction with mild to moderate peri-infarct ischemia.  This is a high risk study. High risk based on decreased LVEF. Based on prior infarct and current level of ischemia alone this would sugest low to intermediate risk . Recommend correlate findings with echo  The left ventricular ejection fraction is moderately decreased (30-44%).  Assessment:    1. Atrial flutter with controlled response ([place])   2. Current use of long term anticoagulation   3. Abnormal stress test   4. Essential hypertension   5. Aortic valve stenosis, etiology of cardiac valve disease unspecified   6. Abdominal pain, unspecified abdominal location   7. Diarrhea, unspecified type      Plan:   In order of problems listed above:  1. Atrial Flutter/ Long-Term Anticoagulation - recently admitted for evaluation of palpitations and dyspnea on exertion, found to be in atrial flutter with RVR. TSH was found to be elevated to 5.714 but a full thyroid panel was not obtained (reports this has been followed by his PCP and no history of thyroid abnormalities - recommended repeat lab work). Echo showed a preserved EF of 50-55% with mild AS and severely dilated LA.  - he notes occasional palpitations but no persistent symptoms. HR was initially elevated to 100 upon walking back to the exam room, improving to 72 with rest. Will further titrate Lopressor to 100mg  BID for improved rate-control (should also help with compliance as he has been taking 3 tablets of Lopressor twice daily due to 75mg  dosing).  - he denies any evidence of active bleeding. Will continue on Eliquis 5mg  BID for anticoagulation.   2. Abnormal Stress Test  - recent NST showed findings consistent with prior inferior/inferolateral myocardial infarction with mild to moderate peri-infarct ischemia. EF read as reduced but was preserved by echo at that time. Overall level of ischemia was low to intermediate-risk. - he denies any recent anginal symptoms.  -  continue with statin and BB therapy. No ASA secondary to the need for Eliquis.   3. HTN - BP is well-controlled at 134/76 during today's visit.  - will titrate Lopressor to 100mg  BID as above. Continue HCTZ 25mg  daily.   4. Aortic Stenosis - mild by recent echocardiogram. Continue to follow.   5. Abdominal Pain/ Diarrhea - the patient reports having intermittent episodes of diarrhea and abdominal pain for the past several weeks. No longer on antibiotic therapy yet symptoms have persisted. He has never undergone a screening colonoscopy but is open to doing so if clinically indicated. Will place GI Referral.    Medication Adjustments/Labs and  Tests Ordered: Current medicines are reviewed at length with the patient today.  Concerns regarding medicines are outlined above.  Medication changes, Labs and Tests ordered today are listed in the Patient Instructions below. Patient Instructions  Medication Instructions:  Your physician has recommended you make the following change in your medication:  Increase Lopressor to 100 mg Two Times Daily   Labwork: NONE   Testing/Procedures: NONE   Follow-Up: Your physician recommends that you schedule a follow-up appointment in: 3-4 months with Dr. Harl Bowie.   Any Other Special Instructions Will Be Listed Below (If Applicable). Your physician wants you to follow-up in: 2-3 Months. You will receive a reminder letter in the mail two months in advance. If you don't receive a letter, please call our office to schedule the follow-up appointment.  If you need a refill on your cardiac medications before your next appointment, please call your pharmacy. Thank you for choosing [facility]!     Signed, Erma Heritage, PA-C  03/02/2017 3:35 PM    [facility] [place], [place], [place]  [address] Phone: [phone]; Fax: [phone]  [address] [place], [place] [address] Phone:  [phone]

## 2017-03-02 ENCOUNTER — Encounter: Payer: Self-pay | Admitting: Student

## 2017-03-02 ENCOUNTER — Ambulatory Visit (INDEPENDENT_AMBULATORY_CARE_PROVIDER_SITE_OTHER): Payer: Medicare Other | Admitting: Student

## 2017-03-02 VITALS — BP 134/76 | HR 72 | Ht 72.0 in | Wt 198.6 lb

## 2017-03-02 DIAGNOSIS — I350 Nonrheumatic aortic (valve) stenosis: Secondary | ICD-10-CM | POA: Insufficient documentation

## 2017-03-02 DIAGNOSIS — Z7901 Long term (current) use of anticoagulants: Secondary | ICD-10-CM | POA: Diagnosis not present

## 2017-03-02 DIAGNOSIS — I10 Essential (primary) hypertension: Secondary | ICD-10-CM

## 2017-03-02 DIAGNOSIS — I4892 Unspecified atrial flutter: Secondary | ICD-10-CM | POA: Diagnosis not present

## 2017-03-02 DIAGNOSIS — R197 Diarrhea, unspecified: Secondary | ICD-10-CM | POA: Insufficient documentation

## 2017-03-02 DIAGNOSIS — R109 Unspecified abdominal pain: Secondary | ICD-10-CM

## 2017-03-02 DIAGNOSIS — R9439 Abnormal result of other cardiovascular function study: Secondary | ICD-10-CM

## 2017-03-02 MED ORDER — ATORVASTATIN CALCIUM 80 MG PO TABS: 80.0000 mg | ORAL_TABLET | Freq: Every day | ORAL | 3 refills | Status: DC

## 2017-03-02 MED ORDER — APIXABAN 5 MG PO TABS: 5.0000 mg | ORAL_TABLET | Freq: Two times a day (BID) | ORAL | 3 refills | Status: DC

## 2017-03-02 MED ORDER — HYDROCHLOROTHIAZIDE 25 MG PO TABS: 25.0000 mg | ORAL_TABLET | Freq: Every day | ORAL | 3 refills | Status: DC

## 2017-03-02 MED ORDER — METOPROLOL TARTRATE 100 MG PO TABS: 100.0000 mg | ORAL_TABLET | Freq: Two times a day (BID) | ORAL | 3 refills | Status: DC

## 2017-03-02 NOTE — Patient Instructions (Signed)
Medication Instructions:  Your physician has recommended you make the following change in your medication:  Increase Lopressor to 100 mg Two Times Daily    Labwork: NONE   Testing/Procedures: NONE   Follow-Up: Your physician recommends that you schedule a follow-up appointment in: 3-4 months with Dr. Harl Bowie.    Any Other Special Instructions Will Be Listed Below (If Applicable). Your physician wants you to follow-up in: 2-3 Months. You will receive a reminder letter in the mail two months in advance. If you don't receive a letter, please call our office to schedule the follow-up appointment.     If you need a refill on your cardiac medications before your next appointment, please call your pharmacy. Thank you for choosing [facility]!

## 2017-03-08 ENCOUNTER — Encounter (HOSPITAL_COMMUNITY): Payer: Self-pay

## 2017-03-08 ENCOUNTER — Emergency Department (HOSPITAL_COMMUNITY): Payer: Medicare Other

## 2017-03-08 ENCOUNTER — Other Ambulatory Visit: Payer: Self-pay

## 2017-03-08 ENCOUNTER — Emergency Department (HOSPITAL_COMMUNITY)
Admission: EM | Admit: 2017-03-08 | Discharge: 2017-03-08 | Disposition: A | Discharge status: Home / Self Care | Payer: Medicare Other | Attending: Emergency Medicine | Admitting: Emergency Medicine

## 2017-03-08 DIAGNOSIS — I110 Hypertensive heart disease with heart failure: Secondary | ICD-10-CM | POA: Diagnosis not present

## 2017-03-08 DIAGNOSIS — R609 Edema, unspecified: Secondary | ICD-10-CM | POA: Diagnosis not present

## 2017-03-08 DIAGNOSIS — R600 Localized edema: Secondary | ICD-10-CM | POA: Diagnosis not present

## 2017-03-08 DIAGNOSIS — I5031 Acute diastolic (congestive) heart failure: Secondary | ICD-10-CM | POA: Insufficient documentation

## 2017-03-08 DIAGNOSIS — R0602 Shortness of breath: Secondary | ICD-10-CM | POA: Insufficient documentation

## 2017-03-08 DIAGNOSIS — Z87891 Personal history of nicotine dependence: Secondary | ICD-10-CM | POA: Insufficient documentation

## 2017-03-08 LAB — CBC WITH DIFFERENTIAL/PLATELET
BASOS PCT: 0 %
Basophils Absolute: 0 10*3/uL (ref 0.0–0.1)
Eosinophils Absolute: 0.1 10*3/uL (ref 0.0–0.7)
Eosinophils Relative: 1 %
HEMATOCRIT: 37.8 % — AB (ref 39.0–52.0)
Hemoglobin: 11.9 g/dL — ABNORMAL LOW (ref 13.0–17.0)
Lymphocytes Relative: 20 %
Lymphs Abs: 1.7 10*3/uL (ref 0.7–4.0)
MCH: 30.1 pg (ref 26.0–34.0)
MCHC: 31.5 g/dL (ref 30.0–36.0)
MCV: 95.5 fL (ref 78.0–100.0)
MONO ABS: 1 10*3/uL (ref 0.1–1.0)
MONOS PCT: 12 %
NEUTROS ABS: 5.5 10*3/uL (ref 1.7–7.7)
Neutrophils Relative %: 67 %
Platelets: 218 10*3/uL (ref 150–400)
RBC: 3.96 MIL/uL — ABNORMAL LOW (ref 4.22–5.81)
RDW: 14.1 % (ref 11.5–15.5)
WBC: 8.2 10*3/uL (ref 4.0–10.5)

## 2017-03-08 LAB — COMPREHENSIVE METABOLIC PANEL
ALK PHOS: 83 U/L (ref 38–126)
ALT: 50 U/L (ref 17–63)
ANION GAP: 7 (ref 5–15)
AST: 26 U/L (ref 15–41)
Albumin: 3.8 g/dL (ref 3.5–5.0)
BUN: 20 mg/dL (ref 6–20)
CALCIUM: 9.5 mg/dL (ref 8.9–10.3)
CO2: 31 mmol/L (ref 22–32)
CREATININE: 0.78 mg/dL (ref 0.61–1.24)
Chloride: 97 mmol/L — ABNORMAL LOW (ref 101–111)
GFR calc Af Amer: >60 mL/min (ref >60)
GFR calc non Af Amer: >60 mL/min (ref >60)
GLUCOSE: 109 mg/dL — AB (ref 65–99)
Potassium: 3.9 mmol/L (ref 3.5–5.1)
SODIUM: 135 mmol/L (ref 135–145)
Total Bilirubin: 0.8 mg/dL (ref 0.3–1.2)
Total Protein: 6.8 g/dL (ref 6.5–8.1)

## 2017-03-08 LAB — TROPONIN I: Troponin I: <0.03 ng/mL (ref <0.03)

## 2017-03-08 LAB — BRAIN NATRIURETIC PEPTIDE: B NATRIURETIC PEPTIDE 5: 408 pg/mL — AB (ref 0.0–100.0)

## 2017-03-08 MED ORDER — FUROSEMIDE 20 MG PO TABS
20.0000 mg | ORAL_TABLET | Freq: Every day | ORAL | 0 refills | Status: DC
Start: 2017-03-08 — End: 2017-03-17

## 2017-03-08 MED ORDER — FUROSEMIDE 10 MG/ML IJ SOLN
20.0000 mg | Freq: Once | INTRAMUSCULAR | Status: AC
  Administered 2017-03-08: 20 mg via INTRAVENOUS
  Filled 2017-03-08: qty 2

## 2017-03-08 NOTE — Discharge Instructions (Signed)
As discussed, you are being started on a medicine called Lasix. It is important that he follow-up with our cardiology colleagues within a week for repeat exam and to consider ongoing therapy and evaluation. Return here for concerning changes in your condition.

## 2017-03-08 NOTE — ED Provider Notes (Signed)
[facility] EMERGENCY DEPARTMENT Provider Note   CSN: [id number] Arrival date & time: 03/08/17  1018     History   Chief Complaint Chief Complaint  Patient presents with  . Shortness of Breath    HPI Michael Stephens is a 82 y.o. male.  HPI Patient presents 1 week after being discharged from this facility now with concern for dyspnea, fatigue, lower extremity swelling. He notes that on discharge she was doing generally well, but over the past few days has been calm increasingly weak, with dyspnea, generalized discomfort, but no focal pain. No fever, no chills, no vomiting, no diarrhea. He has been taking all medication as directed. He is here with his daughter who assists with the HPI.   Past Medical History:  Diagnosis Date  . Acute diastolic CHF (congestive heart failure) ([place]) 02/11/2017  . Atrial flutter ([place])    a. diagnosed in 02/2017. Rate-control strategy pursued.   . Hypertension   . Renal disorder    cyst on kidney    Patient Active Problem List   Diagnosis Date Noted  . Diarrhea 03/02/2017  . Aortic stenosis 03/02/2017  . CAP (community acquired pneumonia) 02/16/2017  . Essential hypertension 02/11/2017  . Demand ischemia ([place]) 02/11/2017  . Acute diastolic CHF (congestive heart failure) ([place]) 02/11/2017  . Acute respiratory failure with hypoxia ([place]) 02/11/2017  . Atrial flutter ([place]) 02/09/2017    History reviewed. No pertinent surgical history.     Home Medications    Prior to Admission medications   Medication Sig Start Date End Date Taking? Authorizing Provider  atorvastatin (LIPITOR) 80 MG tablet Take 1 tablet (80 mg total) daily at 6 PM by mouth. 03/02/17  Yes Strader, [place] M, PA-C  Calcium Carbonate Antacid (ALKA-SELTZER ANTACID PO) Take 1 Dose by mouth daily as needed (indegestion).   Yes [provider]  apixaban (ELIQUIS) 5 MG TABS tablet Take 1 tablet (5 mg total) 2 (two) times daily by mouth. 03/02/17   Strader, Fransisco Hertz, PA-C  hydrochlorothiazide (HYDRODIURIL) 25 MG tablet Take 1 tablet (25 mg total) daily by mouth. 03/02/17   Strader, Fransisco Hertz, PA-C  metoprolol tartrate (LOPRESSOR) 100 MG tablet Take 1 tablet (100 mg total) 2 (two) times daily by mouth. 03/02/17 05/31/17  Strader, Fransisco Hertz, PA-C    Family History Family History  Problem Relation Age of Onset  . CVA Father   . CAD Father   . Diabetes Sister     Social History Social History   Tobacco Use  . Smoking status: Former Smoker    Last attempt to quit: 2006    Years since quitting: 12.9  . Smokeless tobacco: Never Used  Substance Use Topics  . Alcohol use: No  . Drug use: No     Allergies   Penicillins   Review of Systems Review of Systems  Constitutional:       Per HPI, otherwise negative  HENT:       Per HPI, otherwise negative  Respiratory:       Per HPI, otherwise negative  Cardiovascular:       Per HPI, otherwise negative  Gastrointestinal: Negative for vomiting.  Endocrine:       Negative aside from HPI  Genitourinary:       Neg aside from HPI   Musculoskeletal:       Per HPI, otherwise negative  Skin: Negative.   Neurological: Negative for syncope.     Physical Exam Updated Vital Signs BP (!) 119/94  Pulse 98   Temp 98.4 F (36.9 C) (Oral)   Resp (!) 30   Ht 6' (1.829 m)   Wt 89.8 kg (198 lb)   SpO2 99%   BMI 26.85 kg/m   Physical Exam  Constitutional: He is oriented to person, place, and time. He appears well-developed. No distress.  HENT:  Head: Normocephalic and atraumatic.  Eyes: Conjunctivae and EOM are normal.  Cardiovascular: Normal rate and regular rhythm.  Pulmonary/Chest: No stridor. No respiratory distress. He has decreased breath sounds.  Abdominal: He exhibits no distension.  Musculoskeletal:       Right lower leg: He exhibits edema.       Left lower leg: He exhibits edema.  Neurological: He is alert and oriented to person, place, and time.  Skin: Skin is warm and dry.    Psychiatric: He has a normal mood and affect.  Nursing note and vitals reviewed.    ED Treatments / Results  Labs (all labs ordered are listed, but only abnormal results are displayed) Labs Reviewed  COMPREHENSIVE METABOLIC PANEL - Abnormal; Notable for the following components:      Result Value   Chloride 97 (*)    Glucose, Bld 109 (*)    All other components within normal limits  CBC WITH DIFFERENTIAL/PLATELET - Abnormal; Notable for the following components:   RBC 3.96 (*)    Hemoglobin 11.9 (*)    HCT 37.8 (*)    All other components within normal limits  BRAIN NATRIURETIC PEPTIDE - Abnormal; Notable for the following components:   B Natriuretic Peptide 408.0 (*)    All other components within normal limits  TROPONIN I    EKG  EKG Interpretation  Date/Time:  Sunday March 08 2017 11:39:53 EST Ventricular Rate:  92 PR Interval:    QRS Duration: 90 QT Interval:  364 QTC Calculation: 451 R Axis:   -6 Text Interpretation:  Atrial fibrillation Baseline wander Artifact ST-t wave abnormality Abnormal ekg Confirmed by Carmin Muskrat [phone]) on 03/08/2017 12:11:27 PM       Radiology Dg Chest 2 View  Result Date: 03/08/2017 CLINICAL DATA:  Shortness of breath over the past few days. EXAM: CHEST  2 VIEW COMPARISON:  Single-view of the chest 02/12/2017 and 02/09/2017. FINDINGS: Right basilar airspace disease seen on the most recent exam has cleared. No consolidative process, pneumothorax or effusion is identified. Heart size is mildly enlarged. Aortic atherosclerosis noted. IMPRESSION: No acute disease. Mild cardiomegaly. Atherosclerosis. Electronically Signed   By: Inge Rise M.D.   On: 03/08/2017 11:24    Procedures Procedures (including critical care time)  Medications Ordered in ED Medications  furosemide (LASIX) injection 20 mg (20 mg Intravenous Given 03/08/17 1316)     Initial Impression / Assessment and Plan / ED Course  I have reviewed the triage  vital signs and the nursing notes.  Pertinent labs & imaging results that were available during my care of the patient were reviewed by me and considered in my medical decision making (see chart for details).  On repeat exam the patient is in no distress. I discussed all findings thus far including elevated BNP, normal troponin value. Chart review is notable for demonstration that the patient had diuresis throughout his recent hospitalization, but was not sent home on Lasix. Given his increased BNP, swelling, description of dyspnea, there is suspicion for worsening congestive heart failure, particular with no ongoing diuretic therapy. Patient has received IV Lasix here.   1:49 PM She is awake and  alert, sitting on the edge of the bed, drinking a cup of coffee, in no distress. We discussed all findings including his echocardiogram performed last month, and his recent hospitalization.  Given the absence of distress, then normal troponin, nonischemic EKG, no chest pain, there is low suspicion for atypical ACS. Patient is also taking anticoagulant as appropriate. Patient will be started on a course of Lasix, oral, follow-up with cardiology this week for consideration of additional echocardiogram versus longer provision of medication.   Final Clinical Impressions(s) / ED Diagnoses  Dyspnea Lower extremity swelling.   Carmin Muskrat, MD 03/08/17 1400

## 2017-03-08 NOTE — ED Triage Notes (Addendum)
Patient reports of shortness of breath, bilateral leg swelling x2 days. States shortness of breath is worse with ambulation. Denies chest pain. Also reports of left sided abdominal pain with diarrhea x 3 weeks. Started new medication but unsure of name.

## 2017-03-11 ENCOUNTER — Telehealth: Payer: Self-pay | Admitting: Student

## 2017-03-11 NOTE — Telephone Encounter (Signed)
Returned pt call. Pt's wife states that they cannot afford to pay $500. A month for eliquis. They ask if there is another medication he could be switched to that is more affordable. I advised her to bring proof of their income by the office and I will see if he is eligible for assistance. Pt's wife voiced understanding.

## 2017-03-11 NOTE — Telephone Encounter (Signed)
Please give pt's wife a call concerning the pt's apixaban (ELIQUIS) 5 MG TABS tablet [[id number]]

## 2017-03-16 NOTE — Progress Notes (Signed)
Cardiology Office Note   Date:  03/17/2017   ID:  Lowery Paullin, DOB 1934-08-27, MRN [id number]  PCP:  Deloria Lair., MD  Cardiologist:  Carlyle Dolly MD (Has not seen by him yet). Chief Complaint  Patient presents with  . Hypertension  . Atrial Fibrillation     History of Present Illness: Michael Stephens is a 82 y.o. male who presents for ongoing assessment and management of hypertension, history of dyspnea on exertion, atrial flutter with RVR.  Patient is posthospitalization follow-up, and had nuclear medicine stress test to evaluate for ischemia causing arrhythmia and symptoms.  Patient was also found to have non-ST elevation MI with elevated troponin of 2.54.  Echocardiogram revealed EF of 50% to 55% with mild left ear and severely dilated LA.  He was last seen in the office on 03/02/2017, by Bernerd Pho, [place].  That office visit he continued to have complaints of palpitations intermittently.  Lopressor was increased to 100 mg twice daily.  Eschen of abnormal stress test was had with the patient, he had low to intermittent risk for ischemia.  His symptoms persisted may need to consider catheterization.  He was continued on beta-blocker therapy, he was not placed on aspirin as he was already on Eliquis.  He was noted to have normal blood pressure.  Evaluation of his response to increased dose of Lopressor will be had on this office visit.  Complaining of abdominal pain and diarrhea and was referred to GI for colonoscopy or further medication adjustments  at their discretion.  Unfortunately, the patient was seen in the emergency room on 03/06/2017, for symptoms of dyspnea.  Blood pressure was 119/94, pulse was 98.  O2 sat was 99%.  Vision had elevation in BNP.  It was noted per ER physician's note that the patient was not prescribed Lasix on recent discharge from the hospital on 02/16/2017.  The patient was restarted on p.o. Lasix and advised to follow-up with cardiology.  He comes today  feeling better having been placed on his Lasix again.  He was saying that he was having dyspnea on exertion.  His energy level has improved now he is able to breathe better.  He has occasionally taken 1 extra dose of Lasix.  Lower extremity edema has significantly improved.  He denies any chest pain.  He has however not gotten back to his normal energy level.  He states he normally walks at least a mile or more a day, and now he becomes fatigued after walking through [place].   Past Medical History:  Diagnosis Date  . Acute diastolic CHF (congestive heart failure) ([place]) 02/11/2017  . Atrial flutter ([place])    a. diagnosed in 02/2017. Rate-control strategy pursued.   . Hypertension   . Renal disorder    cyst on kidney    Past Surgical History:  Procedure Laterality Date  . ABCESS DRAINAGE  11/2008   BUTTOCKS  . CATARACT EXTRACTION, BILATERAL       Current Outpatient Medications  Medication Sig Dispense Refill  . apixaban (ELIQUIS) 5 MG TABS tablet Take 1 tablet (5 mg total) 2 (two) times daily by mouth. 180 tablet 3  . atorvastatin (LIPITOR) 80 MG tablet Take 1 tablet (80 mg total) daily at 6 PM by mouth. 90 tablet 3  . Calcium Carbonate Antacid (ALKA-SELTZER ANTACID PO) Take 1 Dose by mouth daily as needed (indegestion).    . furosemide (LASIX) 20 MG tablet Take 1 tablet (20 mg total) by mouth daily. 30 tablet 0  .  hydrochlorothiazide (HYDRODIURIL) 25 MG tablet Take 1 tablet (25 mg total) daily by mouth. 90 tablet 3  . metoprolol tartrate (LOPRESSOR) 100 MG tablet Take 1 tablet (100 mg total) 2 (two) times daily by mouth. 180 tablet 3   No current facility-administered medications for this visit.     Allergies:   Penicillins    Social History:  The patient  reports that he quit smoking about 12 years ago. he has never used smokeless tobacco. He reports that he does not drink alcohol or use drugs.   Family History:  The patient's family history includes CAD in his father; CVA in  his father; Diabetes in his sister.    ROS: All other systems are reviewed and negative. Unless otherwise mentioned in H&P    PHYSICAL EXAM: VS:  BP 117/67   Pulse (!) 52   Ht 6' (1.829 m)   Wt 199 lb (90.3 kg)   SpO2 95%   BMI 26.99 kg/m  , BMI Body mass index is 26.99 kg/m. GEN: Well nourished, well developed, in no acute distress  HEENT: normal  Neck: no JVD, carotid bruits, or masses Cardiac: RRR; 2/6 systolic murmur, heard best at the apex, , rubs, or gallops,no edema absent pulse in the lower right, thready pulse in the left.  Doppler pulse absent in the right, auscultated in the left. Respiratory:  clear to auscultation bilaterally, normal work of breathing GI: soft, nontender, nondistended, + BS MS: no deformity or atrophy  Skin: warm and dry, no rash Neuro:  Strength and sensation are intact Psych: euthymic mood, full affect  Recent Labs: 02/09/2017: TSH 5.714 02/10/2017: Magnesium 2.0 03/08/2017: ALT 50; B Natriuretic Peptide 408.0; BUN 20; Creatinine, Ser 0.78; Hemoglobin 11.9; Platelets 218; Potassium 3.9; Sodium 135    Lipid Panel    Component Value Date/Time   CHOL 145 02/09/2017 1303   TRIG 57 02/09/2017 1303   HDL 44 02/09/2017 1303   CHOLHDL 3.3 02/09/2017 1303   VLDL 11 02/09/2017 1303   LDLCALC 90 02/09/2017 1303      Wt Readings from Last 3 Encounters:  03/17/17 199 lb (90.3 kg)  03/08/17 198 lb (89.8 kg)  03/02/17 198 lb 9.6 oz (90.1 kg)      Other studies Reviewed: NM Stress Test Mar 13, 2017  There was no ST segment deviation noted during stress.  Findings consistent with prior inferior/inferolateral myocardial infarction with mild to moderate peri-infarct ischemia.  This is a high risk study. High risk based on decreased LVEF. Based on prior infarct and current level of ischemia alone this would sugest low to intermediate risk . Recommend correlate findings with echo  The left ventricular ejection fraction is moderately decreased  (30-44%).  Echocardiogram 02/10/2017 Left ventricle: The cavity size was normal. Wall thickness was   increased in a pattern of mild LVH. Systolic function was normal.   The estimated ejection fraction was in the range of 50% to 55%.   Doppler parameters are consistent with restrictive physiology,   indicative of decreased left ventricular diastolic compliance   and/or increased left atrial pressure. Doppler parameters are   consistent with high ventricular filling pressure. - Aortic valve: Mildly calcified annulus. Trileaflet; mildly   thickened leaflets. There was very mild stenosis. Valve area   (VTI): 1.59 cm^2. Valve area (Vmax): 1.73 cm^2. - Left atrium: The atrium was severely dilated. - Right atrium: The atrium was mildly dilated. - Atrial septum: No defect or patent foramen ovale was identified. - Inferior vena cava: The  vessel was dilated. The respirophasic   diameter changes were blunted (< 50%), consistent with elevated   central venous pressure. - Technically difficult study.  ASSESSMENT AND PLAN:  1.  Atrial fib: Heart rate is well controlled.  He denies any complaints of bleeding or melena.  Metoprolol 100 mg twice daily appears to be serving him well.  We will not make any changes.  Samples of Eliquis is provided they are filling out forms to allow them to get medication at a reduced cost.   2.  Abnormal nuclear medicine stress test.  The patient energy level is improved somewhat, but is not back to his baseline.  Denies chest pain, breathing status is improved with reinstitution of Lasix, but he is unable to walk as far without "giving out" and having to rest.  I am concerned that he may need to go ahead and have catheterization sooner than later despite absence of chest pain.  He wishes to wait a little longer before proceeding.  I have advised him if he has worsening fatigue, dyspnea, or chest discomfort, we will likely need to proceed with catheterization.  Cardiovascular  risk factors include hypertension, age, gender.  3.  Rule out PAD: Bilateral ABIs will be completed as unable to palpate dorsalis pedis, and posterior tibial pulse on the right, Doppler ultrasound in the office was unable to auscultate pulses on the right dorsalis pedis or posterior tibial.  He has diminished pulses on the left.  Current medicines are reviewed at length with the patient today.    Labs/ tests ordered today include: Bilateral ABIs. Phill Myron. [place] Pugh, ANP, AACC   03/17/2017 3:44 PM    [facility] [address] [place], [place] [address] Phone: [phone]; Fax: [phone]

## 2017-03-17 ENCOUNTER — Ambulatory Visit (INDEPENDENT_AMBULATORY_CARE_PROVIDER_SITE_OTHER): Payer: Medicare Other | Admitting: Adult Health

## 2017-03-17 ENCOUNTER — Encounter: Payer: Self-pay | Admitting: *Deleted

## 2017-03-17 VITALS — BP 117/67 | HR 52 | Ht 72.0 in | Wt 199.0 lb

## 2017-03-17 DIAGNOSIS — R9439 Abnormal result of other cardiovascular function study: Secondary | ICD-10-CM | POA: Diagnosis not present

## 2017-03-17 DIAGNOSIS — R0989 Other specified symptoms and signs involving the circulatory and respiratory systems: Secondary | ICD-10-CM

## 2017-03-17 DIAGNOSIS — I4892 Unspecified atrial flutter: Secondary | ICD-10-CM | POA: Diagnosis not present

## 2017-03-17 DIAGNOSIS — I739 Peripheral vascular disease, unspecified: Secondary | ICD-10-CM | POA: Diagnosis not present

## 2017-03-17 MED ORDER — FUROSEMIDE 20 MG PO TABS: 20.0000 mg | ORAL_TABLET | Freq: Every day | ORAL | 11 refills | Status: DC

## 2017-03-17 NOTE — Addendum Note (Signed)
Addended by: Levonne Hubert on: 03/17/2017 04:29 PM   Modules accepted: Orders

## 2017-03-17 NOTE — Patient Instructions (Signed)
Medication Instructions:  Your physician recommends that you continue on your current medications as directed. Please refer to the Current Medication list given to you today.   Labwork: NONE   Testing/Procedures: NONE   Follow-Up: Your physician recommends that you schedule a follow-up appointment in: 1 Month    Any Other Special Instructions Will Be Listed Below (If Applicable).     If you need a refill on your cardiac medications before your next appointment, please call your pharmacy.  Thank you for choosing [facility]!   

## 2017-03-18 ENCOUNTER — Encounter (INDEPENDENT_AMBULATORY_CARE_PROVIDER_SITE_OTHER): Payer: Self-pay | Admitting: Internal Medicine

## 2017-03-18 ENCOUNTER — Encounter (INDEPENDENT_AMBULATORY_CARE_PROVIDER_SITE_OTHER): Payer: Self-pay | Admitting: *Deleted

## 2017-03-18 ENCOUNTER — Telehealth (INDEPENDENT_AMBULATORY_CARE_PROVIDER_SITE_OTHER): Payer: Self-pay | Admitting: *Deleted

## 2017-03-18 ENCOUNTER — Ambulatory Visit (INDEPENDENT_AMBULATORY_CARE_PROVIDER_SITE_OTHER): Payer: Medicare Other | Admitting: Internal Medicine

## 2017-03-18 VITALS — BP 120/80 | HR 100 | Temp 98.0°F | Ht 72.0 in | Wt 197.8 lb

## 2017-03-18 DIAGNOSIS — R195 Other fecal abnormalities: Secondary | ICD-10-CM | POA: Insufficient documentation

## 2017-03-18 MED ORDER — PEG 3350-KCL-NA BICARB-NACL 420 G PO SOLR: 4000.0000 mL | Freq: Once | ORAL | 0 refills | Status: AC

## 2017-03-18 NOTE — Telephone Encounter (Signed)
Patient aware -- forwarded to Dr Laural Golden for review

## 2017-03-18 NOTE — Progress Notes (Signed)
   Subjective:    Patient ID: Michael Stephens, male    DOB: 1934/06/02, 82 y.o.   MRN: [id number] PCP Dr. Scotty Court.  HPI  Referred by [facility] for diarrhea. States for along time, it feels like his bowel are not moving right.  He tells me he has been having diarrhea. The diarrhea started last month. Has not had any diarrhea in the past few weeks.  He says he is having small BMs. States his stools were fine till he started the new medications (Eliquis, Atorvastatin, Lasix, Hydrochlorothiazide, and Metoprolol. His stools are formed now for the last 3-4 days. He has not seen any blood in his stools. He is having a BM x 2 a day and sometimes just one stool a day. His appetite is good. No weight loss.  He has never undergone a colonoscopy in the past.     New hx  of atrial flutter, CHF and maintained on Eliquis Echo 02/10/2017 EF 50-55% Review of Systems Past Medical History:  Diagnosis Date  . Acute diastolic CHF (congestive heart failure) ([place]) 02/11/2017  . Atrial flutter ([employer])    a. diagnosed in 02/2017. Rate-control strategy pursued.   . Hypertension   . Renal disorder    cyst on kidney    Past Surgical History:  Procedure Laterality Date  . ABCESS DRAINAGE  11/2008   BUTTOCKS  . CATARACT EXTRACTION, BILATERAL      Allergies  Allergen Reactions  . Penicillins     Has patient had a PCN reaction causing immediate rash, facial/tongue/throat swelling, SOB or lightheadedness with hypotension: Yes Has patient had a PCN reaction causing severe rash involving mucus membranes or skin necrosis: No Has patient had a PCN reaction that required hospitalization: No Has patient had a PCN reaction occurring within the last 10 years: No If all of the above answers are "NO", then may proceed with Cephalosporin use.    Current Outpatient Medications on File Prior to Visit  Medication Sig Dispense Refill  . apixaban (ELIQUIS) 5 MG TABS tablet Take 1 tablet (5 mg total) 2 (two) times daily by  mouth. 180 tablet 3  . atorvastatin (LIPITOR) 80 MG tablet Take 1 tablet (80 mg total) daily at 6 PM by mouth. 90 tablet 3  . furosemide (LASIX) 20 MG tablet Take 1 tablet (20 mg total) by mouth daily. 40 tablet 11  . hydrochlorothiazide (HYDRODIURIL) 25 MG tablet Take 1 tablet (25 mg total) daily by mouth. 90 tablet 3  . metoprolol tartrate (LOPRESSOR) 100 MG tablet Take 1 tablet (100 mg total) 2 (two) times daily by mouth. 180 tablet 3   No current facility-administered medications on file prior to visit.         Objective:   Physical Exam Blood pressure 120/80, pulse 100, temperature 98 F (36.7 C), height 6' (1.829 m), weight 197 lb 12.8 oz (89.7 kg). Alert and oriented. Skin warm and dry. Oral mucosa is moist.   . Sclera anicteric, conjunctivae is pink. Thyroid not enlarged. No cervical lymphadenopathy. Lungs clear. Heart regular rate and rhythm.  Abdomen is soft. Bowel sounds are positive. No hepatomegaly. No abdominal masses felt. No tenderness.  No edema to lower extremities.           Assessment & Plan:  Change in stool. Colonoscopy. .The risks of bleeding, perforation and infection were reviewed with patient.

## 2017-03-18 NOTE — Patient Instructions (Signed)
Colonoscopy. The risks of bleeding, perforation and infection were reviewed with patient.  

## 2017-03-18 NOTE — Telephone Encounter (Signed)
Patient scheduled for colonoscopy 04/02/17 -- needs to stop Eliquis 2 days before procedure --please advise if ok to sotp, thanks

## 2017-03-18 NOTE — Telephone Encounter (Signed)
Patient needs trilyte 

## 2017-03-18 NOTE — Telephone Encounter (Signed)
OK to hold Eliquis 2 days prior to colonoscopy.  Resume night of procedure if OK with Dr Laural Golden.

## 2017-03-29 NOTE — Telephone Encounter (Signed)
noted 

## 2017-04-02 ENCOUNTER — Encounter (HOSPITAL_COMMUNITY): Payer: Self-pay | Admitting: *Deleted

## 2017-04-02 ENCOUNTER — Encounter (HOSPITAL_COMMUNITY)
Admission: RE | Disposition: A | Discharge status: Home / Self Care | Payer: Self-pay | Source: Ambulatory Visit | Attending: Internal Medicine

## 2017-04-02 ENCOUNTER — Other Ambulatory Visit: Payer: Self-pay

## 2017-04-02 ENCOUNTER — Ambulatory Visit (HOSPITAL_COMMUNITY)
Admission: RE | Admit: 2017-04-02 | Discharge: 2017-04-02 | Disposition: A | Discharge status: Home / Self Care | Payer: Medicare Other | Source: Ambulatory Visit | Attending: Internal Medicine | Admitting: Internal Medicine

## 2017-04-02 DIAGNOSIS — I5032 Chronic diastolic (congestive) heart failure: Secondary | ICD-10-CM | POA: Diagnosis not present

## 2017-04-02 DIAGNOSIS — R195 Other fecal abnormalities: Secondary | ICD-10-CM | POA: Insufficient documentation

## 2017-04-02 DIAGNOSIS — K644 Residual hemorrhoidal skin tags: Secondary | ICD-10-CM | POA: Insufficient documentation

## 2017-04-02 DIAGNOSIS — D122 Benign neoplasm of ascending colon: Secondary | ICD-10-CM | POA: Insufficient documentation

## 2017-04-02 DIAGNOSIS — D125 Benign neoplasm of sigmoid colon: Secondary | ICD-10-CM | POA: Diagnosis not present

## 2017-04-02 DIAGNOSIS — Z79899 Other long term (current) drug therapy: Secondary | ICD-10-CM | POA: Insufficient documentation

## 2017-04-02 DIAGNOSIS — Z87891 Personal history of nicotine dependence: Secondary | ICD-10-CM | POA: Insufficient documentation

## 2017-04-02 DIAGNOSIS — Z7902 Long term (current) use of antithrombotics/antiplatelets: Secondary | ICD-10-CM | POA: Diagnosis not present

## 2017-04-02 DIAGNOSIS — Z880 Allergy status to penicillin: Secondary | ICD-10-CM | POA: Diagnosis not present

## 2017-04-02 DIAGNOSIS — I4891 Unspecified atrial fibrillation: Secondary | ICD-10-CM | POA: Diagnosis not present

## 2017-04-02 DIAGNOSIS — I110 Hypertensive heart disease with heart failure: Secondary | ICD-10-CM | POA: Insufficient documentation

## 2017-04-02 DIAGNOSIS — K648 Other hemorrhoids: Secondary | ICD-10-CM | POA: Insufficient documentation

## 2017-04-02 DIAGNOSIS — D649 Anemia, unspecified: Secondary | ICD-10-CM | POA: Diagnosis not present

## 2017-04-02 DIAGNOSIS — D175 Benign lipomatous neoplasm of intra-abdominal organs: Secondary | ICD-10-CM | POA: Diagnosis not present

## 2017-04-02 DIAGNOSIS — I4892 Unspecified atrial flutter: Secondary | ICD-10-CM | POA: Insufficient documentation

## 2017-04-02 DIAGNOSIS — R194 Change in bowel habit: Secondary | ICD-10-CM | POA: Diagnosis not present

## 2017-04-02 HISTORY — PX: COLONOSCOPY: SHX5424

## 2017-04-02 HISTORY — PX: POLYPECTOMY: SHX5525

## 2017-04-02 SURGERY — COLONOSCOPY
Anesthesia: Moderate Sedation

## 2017-04-02 MED ORDER — MIDAZOLAM HCL 5 MG/5ML IJ SOLN
INTRAMUSCULAR | Status: DC | PRN
  Administered 2017-04-02: 2 mg via INTRAVENOUS
  Administered 2017-04-02 (×3): 1 mg via INTRAVENOUS

## 2017-04-02 MED ORDER — MIDAZOLAM HCL 5 MG/5ML IJ SOLN
INTRAMUSCULAR | Status: AC
  Filled 2017-04-02: qty 10

## 2017-04-02 MED ORDER — MEPERIDINE HCL 50 MG/ML IJ SOLN
INTRAMUSCULAR | Status: DC | PRN
  Administered 2017-04-02 (×2): 25 mg via INTRAVENOUS

## 2017-04-02 MED ORDER — SODIUM CHLORIDE 0.9 % IV SOLN
INTRAVENOUS | Status: DC
  Administered 2017-04-02: 10:00:00 via INTRAVENOUS

## 2017-04-02 MED ORDER — MEPERIDINE HCL 50 MG/ML IJ SOLN
INTRAMUSCULAR | Status: AC
  Filled 2017-04-02: qty 1

## 2017-04-02 NOTE — Op Note (Signed)
[facility] Patient Name: Michael Stephens Procedure Date: 04/02/2017 10:05 AM MRN: [id number] Date of Birth: [birth year] Attending MD: Hildred Laser , MD CSN: [id number] Age: 82 Admit Type: Outpatient Procedure:                Colonoscopy Indications:              Change in bowel habits Providers:                Hildred Laser, MD, Janeece Riggers, RN, Charlsie Quest.                            Theda Sers RN, RN Referring MD:             Rozann Lesches, MD Medicines:                Meperidine 50 mg IV, Midazolam 5 mg IV Complications:            No immediate complications. Estimated Blood Loss:     Estimated blood loss was minimal. Procedure:                Pre-Anesthesia Assessment:                           - Prior to the procedure, a History and Physical                            was performed, and patient medications and                            allergies were reviewed. The patient's tolerance of                            previous anesthesia was also reviewed. The risks                            and benefits of the procedure and the sedation                            options and risks were discussed with the patient.                            All questions were answered, and informed consent                            was obtained. Prior Anticoagulants: The patient                            last took Eliquis (apixaban) 2 days prior to the                            procedure. ASA Grade Assessment: III - A patient                            with severe systemic disease. After reviewing the  risks and benefits, the patient was deemed in                            satisfactory condition to undergo the procedure.                           After obtaining informed consent, the colonoscope                            was passed under direct vision. Throughout the                            procedure, the patient's blood pressure, pulse, and       oxygen saturations were monitored continuously. The                            EC-[id number] ([id number]) scope was introduced through                            the anus and advanced to the the cecum, identified                            by appendiceal orifice and ileocecal valve. The                            colonoscopy was technically difficult and complex                            due to a tortuous colon. The patient tolerated the                            procedure well. The quality of the bowel                            preparation was adequate. The ileocecal valve,                            appendiceal orifice, and rectum were photographed. Scope In: 10:41:21 AM Scope Out: 11:34:00 AM Scope Withdrawal Time: 0 hours 35 minutes 40 seconds  Total Procedure Duration: 0 hours 52 minutes 39 seconds  Findings:      The perianal and digital rectal examinations were normal.      A 15 mm polyp was found in the ascending colon. The polyp was       semi-sessile. The polyp was removed with a piecemeal technique using a       hot snare. Resection was complete, but the polyp tissue was only       partially retrieved. To prevent bleeding after the polypectomy, two       hemostatic clips were successfully placed (MR conditional). There was no       bleeding during, or at the end, of the procedure. The pathology specimen       was placed into Bottle Number 2.      There was a medium-sized lipoma, at the hepatic flexure.      Three  semi-sessile polyps were found in the sigmoid colon. The polyps       were small in size. These polyps were removed with a cold snare.       Resection and retrieval were complete. The pathology specimen was placed       into Bottle Number 1.      A small polyp was found in the ascending colon. The polyp was sessile.       Biopsies were taken with a cold forceps for histology. The pathology       specimen was placed into Bottle Number 1.      Mucosa of entire colon  appeared normal. Biopsies for histology were       taken with a cold forceps from the sigmoid colon for evaluation of       microscopic colitis. The pathology specimen was placed into Bottle       Number 3.      External and internal hemorrhoids were found during retroflexion. The       hemorrhoids were medium-sized. Impression:               - One 15 mm polyp in the ascending colon, removed                            piecemeal using a hot snare. Complete resection.                            Partial retrieval. Larger piece remover with                            retrivel net. Clips (MR conditional) were placed.                           - Medium-sized lipoma at the hepatic flexure.                           - Three small polyps in the sigmoid colon, removed                            with a cold snare. Resected and retrieved.                           - One small polyp in the ascending colon. Biopsied.                           - The sigmoid colon is normal. Biopsied.                           - External and internal hemorrhoids. Moderate Sedation:      Moderate (conscious) sedation was administered by the endoscopy nurse       and supervised by the endoscopist. The following parameters were       monitored: oxygen saturation, heart rate, blood pressure, CO2       capnography and response to care. Total physician intraservice time was       60 minutes. Recommendation:           - Patient has a contact number available for  emergencies. The signs and symptoms of potential                            delayed complications were discussed with the                            patient. Return to normal activities tomorrow.                            Written discharge instructions were provided to the                            patient.                           - Resume previous diet today.                           - Continue present medications.                            - Resume Eliquis (apixaban) at prior dose in 4 days.                           Benfiber 4 grams po qhs.                           - Await pathology results.                           - Repeat colonoscopy is recommended. The                            colonoscopy date will be determined after pathology                            results from today's exam become available for                            review. Procedure Code(s):        --- Professional ---                           [phone], Colonoscopy, flexible; with removal of                            tumor(s), polyp(s), or other lesion(s) by snare                            technique                           93810, 59, Colonoscopy, flexible; with biopsy,                            single or multiple  62130, Moderate sedation services provided by the                            same physician or other qualified health care                            professional performing the diagnostic or                            therapeutic service that the sedation supports,                            requiring the presence of an independent trained                            observer to assist in the monitoring of the                            patient's level of consciousness and physiological                            status; initial 15 minutes of intraservice time,                            patient age 22 years or older                           [phone], Moderate sedation services; each additional                            15 minutes intraservice time                           99153, Moderate sedation services; each additional                            15 minutes intraservice time                           99153, Moderate sedation services; each additional                            15 minutes intraservice time Diagnosis Code(s):        --- Professional ---                           D12.2, Benign neoplasm of  ascending colon                           D17.5, Benign lipomatous neoplasm of                            intra-abdominal organs                           D12.5, Benign neoplasm of sigmoid  colon                           K64.8, Other hemorrhoids                           R19.4, Change in bowel habit CPT copyright 2016 [employer]. All rights reserved. The codes documented in this report are preliminary and upon coder review may  be revised to meet current compliance requirements. Hildred Laser, MD Hildred Laser, MD 04/02/2017 11:54:56 AM This report has been signed electronically. Number of Addenda: 0

## 2017-04-02 NOTE — Discharge Instructions (Signed)
No aspirin or NSAIDs for 1 week.   Resume Eliquis/Apixaban on 04/06/2017. Resume other medications as before. Benefiber 4 g p.o. Nightly. Resume usual diet. No driving for 24 hours. Physician will call with biopsy results.   Colonoscopy, Adult, Care After This sheet gives you information about how to care for yourself after your procedure. Your doctor may also give you more specific instructions. If you have problems or questions, call your doctor. Follow these instructions at home: General instructions   For the first 24 hours after the procedure: ? Do not drive or use machinery. ? Do not sign important documents. ? Do not drink alcohol. ? Do your daily activities more slowly than normal. ? Eat foods that are soft and easy to digest. ? Rest often.  Take over-the-counter or prescription medicines only as told by your doctor.  It is up to you to get the results of your procedure. Ask your doctor, or the department performing the procedure, when your results will be ready. To help cramping and bloating:  Try walking around.  Put heat on your belly (abdomen) as told by your doctor. Use a heat source that your doctor recommends, such as a moist heat pack or a heating pad. ? Put a towel between your skin and the heat source. ? Leave the heat on for 20-30 minutes. ? Remove the heat if your skin turns bright red. This is especially important if you cannot feel pain, heat, or cold. You can get burned. Eating and drinking  Drink enough fluid to keep your pee (urine) clear or pale yellow.  Return to your normal diet as told by your doctor. Avoid heavy or fried foods that are hard to digest.  Avoid drinking alcohol for as long as told by your doctor. Contact a doctor if:  You have blood in your poop (stool) 2-3 days after the procedure. Get help right away if:  You have more than a small amount of blood in your poop.  You see large clumps of tissue (blood clots) in your  poop.  Your belly is swollen.  You feel sick to your stomach (nauseous).  You throw up (vomit).  You have a fever.  You have belly pain that gets worse, and medicine does not help your pain. This information is not intended to replace advice given to you by your health care provider. Make sure you discuss any questions you have with your health care provider. Document Released: 05/03/2010 Document Revised: 12/24/2015 Document Reviewed: 12/24/2015 Elsevier Interactive Patient Education  2017 [employer]  Hemorrhoids Hemorrhoids are swollen veins in and around the rectum or anus. There are two types of hemorrhoids:  Internal hemorrhoids. These occur in the veins that are just inside the rectum. They may poke through to the outside and become irritated and painful.  External hemorrhoids. These occur in the veins that are outside of the anus and can be felt as a painful swelling or hard lump near the anus.  Most hemorrhoids do not cause serious problems, and they can be managed with home treatments such as diet and lifestyle changes. If home treatments do not help your symptoms, procedures can be done to shrink or remove the hemorrhoids. What are the causes? This condition is caused by increased pressure in the anal area. This pressure may result from various things, including:  Constipation.  Straining to have a bowel movement.  Diarrhea.  Pregnancy.  Obesity.  Sitting for long periods of time.  Heavy lifting or other  activity that causes you to strain.  Anal sex.  What are the signs or symptoms? Symptoms of this condition include:  Pain.  Anal itching or irritation.  Rectal bleeding.  Leakage of stool (feces).  Anal swelling.  One or more lumps around the anus.  How is this diagnosed? This condition can often be diagnosed through a visual exam. Other exams or tests may also be done, such as:  Examination of the rectal area with a gloved hand (digital  rectal exam).  Examination of the anal canal using a small tube (anoscope).  A blood test, if you have lost a significant amount of blood.  A test to look inside the colon (sigmoidoscopy or colonoscopy).  How is this treated? This condition can usually be treated at home. However, various procedures may be done if dietary changes, lifestyle changes, and other home treatments do not help your symptoms. These procedures can help make the hemorrhoids smaller or remove them completely. Some of these procedures involve surgery, and others do not. Common procedures include:  Rubber band ligation. Rubber bands are placed at the base of the hemorrhoids to cut off the blood supply to them.  Sclerotherapy. Medicine is injected into the hemorrhoids to shrink them.  Infrared coagulation. A type of light energy is used to get rid of the hemorrhoids.  Hemorrhoidectomy surgery. The hemorrhoids are surgically removed, and the veins that supply them are tied off.  Stapled hemorrhoidopexy surgery. A circular stapling device is used to remove the hemorrhoids and use staples to cut off the blood supply to them.  Follow these instructions at home: Eating and drinking  Eat foods that have a lot of fiber in them, such as whole grains, beans, nuts, fruits, and vegetables. Ask your health care provider about taking products that have added fiber (fiber supplements).  Drink enough fluid to keep your urine clear or pale yellow. Managing pain and swelling  Take warm sitz baths for 20 minutes, 3-4 times a day to ease pain and discomfort.  If directed, apply ice to the affected area. Using ice packs between sitz baths may be helpful. ? Put ice in a plastic bag. ? Place a towel between your skin and the bag. ? Leave the ice on for 20 minutes, 2-3 times a day. General instructions  Take over-the-counter and prescription medicines only as told by your health care provider.  Use medicated creams or  suppositories as told.  Exercise regularly.  Go to the bathroom when you have the urge to have a bowel movement. Do not wait.  Avoid straining to have bowel movements.  Keep the anal area dry and clean. Use wet toilet paper or moist towelettes after a bowel movement.  Do not sit on the toilet for long periods of time. This increases blood pooling and pain. Contact a health care provider if:  You have increasing pain and swelling that are not controlled by treatment or medicine.  You have uncontrolled bleeding.  You have difficulty having a bowel movement, or you are unable to have a bowel movement.  You have pain or inflammation outside the area of the hemorrhoids. This information is not intended to replace advice given to you by your health care provider. Make sure you discuss any questions you have with your health care provider. Document Released: 03/28/2000 Document Revised: 08/29/2015 Document Reviewed: 12/13/2014 Elsevier Interactive Patient Education  2018 [employer].  Colon Polyps Polyps are tissue growths inside the body. Polyps can grow in many places,  including the large intestine (colon). A polyp may be a round bump or a mushroom-shaped growth. You could have one polyp or several. Most colon polyps are noncancerous (benign). However, some colon polyps can become cancerous over time. What are the causes? The exact cause of colon polyps is not known. What increases the risk? This condition is more likely to develop in people who:  Have a family history of colon cancer or colon polyps.  Are older than 35 or older than 45 if they are African American.  Have inflammatory bowel disease, such as ulcerative colitis or Crohn disease.  Are overweight.  Smoke cigarettes.  Do not get enough exercise.  Drink too much alcohol.  Eat a diet that is: ? High in fat and red meat. ? Low in fiber.  Had childhood cancer that was treated with abdominal radiation.  What  are the signs or symptoms? Most polyps do not cause symptoms. If you have symptoms, they may include:  Blood coming from your rectum when having a bowel movement.  Blood in your stool.The stool may look dark red or black.  A change in bowel habits, such as constipation or diarrhea.  How is this diagnosed? This condition is diagnosed with a colonoscopy. This is a procedure that uses a lighted, flexible scope to look at the inside of your colon. How is this treated? Treatment for this condition involves removing any polyps that are found. Those polyps will then be tested for cancer. If cancer is found, your health care provider will talk to you about options for colon cancer treatment. Follow these instructions at home: Diet  Eat plenty of fiber, such as fruits, vegetables, and whole grains.  Eat foods that are high in calcium and vitamin D, such as milk, cheese, yogurt, eggs, liver, fish, and broccoli.  Limit foods high in fat, red meats, and processed meats, such as hot dogs, sausage, bacon, and lunch meats.  Maintain a healthy weight, or lose weight if recommended by your health care provider. General instructions  Do not smoke cigarettes.  Do not drink alcohol excessively.  Keep all follow-up visits as told by your health care provider. This is important. This includes keeping regularly scheduled colonoscopies. Talk to your health care provider about when you need a colonoscopy.  Exercise every day or as told by your health care provider. Contact a health care provider if:  You have new or worsening bleeding during a bowel movement.  You have new or increased blood in your stool.  You have a change in bowel habits.  You unexpectedly lose weight. This information is not intended to replace advice given to you by your health care provider. Make sure you discuss any questions you have with your health care provider. Document Released: 12/26/2003 Document Revised: 09/06/2015  Document Reviewed: 02/19/2015 Elsevier Interactive Patient Education  [employer].

## 2017-04-02 NOTE — H&P (Signed)
Michael Stephens is an 82 y.o. male.   Chief Complaint: Patient is here for colonoscopy. HPI: Patient is 82 year old Caucasian male who was hospitalized last month for atrial fibrillation and DHF.  He was begun on some new medications.  He states he has had bowel problems ever since.  Prior to that his bowels move like clockwork every morning after he drank cup of coffee.  Now he has diarrhea with squirts and urgency.  He denies abdominal cramping melena or rectal bleeding.  He states he may have lost 5 pounds in 1 month.  He has never been screened for CRC. Patient has been off Eliquis for 2 days. Review of lab data reveals him to be anemic.  Hemoglobin was 11.9 on 02/15/2017 and MCV was normal at 94.8.  Past Medical History:  Diagnosis Date  . Acute diastolic CHF (congestive heart failure) ([place]) 02/11/2017  . Atrial flutter ([place])    a. diagnosed in 02/2017. Rate-control strategy pursued.   . Hypertension   . Renal disorder    cyst on kidney       Anemia  Past Surgical History:  Procedure Laterality Date  . ABCESS DRAINAGE  11/2008   BUTTOCKS  . CATARACT EXTRACTION, BILATERAL      Family History  Problem Relation Age of Onset  . CVA Father   . CAD Father   . Diabetes Sister   . Colon cancer Neg Hx    Social History:  reports that he quit smoking about 12 years ago. he has never used smokeless tobacco. He reports that he does not drink alcohol or use drugs.  Allergies:  Allergies  Allergen Reactions  . Penicillins     Has patient had a PCN reaction causing immediate rash, facial/tongue/throat swelling, SOB or lightheadedness with hypotension: Yes Has patient had a PCN reaction causing severe rash involving mucus membranes or skin necrosis: No Has patient had a PCN reaction that required hospitalization: No Has patient had a PCN reaction occurring within the last 10 years: No If all of the above answers are "NO", then may proceed with Cephalosporin use.    Medications Prior to  Admission  Medication Sig Dispense Refill  . apixaban (ELIQUIS) 5 MG TABS tablet Take 1 tablet (5 mg total) 2 (two) times daily by mouth. 180 tablet 3  . atorvastatin (LIPITOR) 80 MG tablet Take 1 tablet (80 mg total) daily at 6 PM by mouth. 90 tablet 3  . furosemide (LASIX) 20 MG tablet Take 1 tablet (20 mg total) by mouth daily. 40 tablet 11  . hydrochlorothiazide (HYDRODIURIL) 25 MG tablet Take 1 tablet (25 mg total) daily by mouth. 90 tablet 3  . metoprolol tartrate (LOPRESSOR) 100 MG tablet Take 1 tablet (100 mg total) 2 (two) times daily by mouth. 180 tablet 3    No results found for this or any previous visit (from the past 48 hour(s)). No results found.  ROS  Blood pressure 126/77, pulse (!) 106, temperature 97.8 F (36.6 C), temperature source Oral, resp. rate (!) 24, height 6' (1.829 m), weight 197 lb (89.4 kg), SpO2 98 %. Physical Exam  Constitutional: He appears well-developed and well-nourished.  HENT:  Mouth/Throat: Oropharynx is clear and moist.  Eyes: Conjunctivae are normal. No scleral icterus.  Neck: No thyromegaly present.  Cardiovascular:  Irregular rhythm normal S1 and S2.  No murmur or gallop noted.  Respiratory: Effort normal and breath sounds normal.  GI:  Abdomen is soft and nontender without organomegaly or masses.  Musculoskeletal:  He exhibits no edema.  Lymphadenopathy:    He has no cervical adenopathy.  Neurological: He is alert.  Skin: Skin is warm and dry.     Assessment/Plan Recent change in bowel habits with intermittent diarrhea and urgency. Diagnostic colonoscopy.  Hildred Laser, MD 04/02/2017, 10:28 AM

## 2017-04-08 ENCOUNTER — Telehealth: Payer: Self-pay | Admitting: Student

## 2017-04-08 ENCOUNTER — Encounter (HOSPITAL_COMMUNITY): Payer: Self-pay | Admitting: Internal Medicine

## 2017-04-08 NOTE — Telephone Encounter (Signed)
Patient's wife is asking for samples of Eliquis/tg

## 2017-04-08 NOTE — Telephone Encounter (Signed)
Spoke with pt's wife. Advised her that we do not have any samples at this time. I did ask [facility] office if they could spare a box, they did. Pt states he will pick up tomorrow.

## 2017-04-24 ENCOUNTER — Telehealth: Payer: Self-pay | Admitting: Student

## 2017-04-24 NOTE — Telephone Encounter (Signed)
Calling in regards to patient filling out Patient Assistance paperwork for Eliquis. / tg

## 2017-04-24 NOTE — Telephone Encounter (Signed)
Patient states he can not afford Eliquis and would like samples. Spoke with [facility] who states that pt has a deducible of $427. Pt has applied for Eliquis assistance and is awaiting results. Eliquis assistance forms refaxed.

## 2017-04-29 ENCOUNTER — Telehealth: Payer: Self-pay | Admitting: *Deleted

## 2017-04-29 MED ORDER — APIXABAN 5 MG PO TABS: 5.0000 mg | ORAL_TABLET | Freq: Two times a day (BID) | ORAL | 0 refills | Status: DC

## 2017-04-29 NOTE — Telephone Encounter (Signed)
Pt aware that Eliquis samples are available and will pick up this afternoon

## 2017-05-08 ENCOUNTER — Emergency Department (HOSPITAL_COMMUNITY)
Admission: EM | Admit: 2017-05-08 | Discharge: 2017-05-08 | Disposition: A | Discharge status: Home / Self Care | Payer: Medicare Other | Attending: Emergency Medicine | Admitting: Emergency Medicine

## 2017-05-08 ENCOUNTER — Encounter (HOSPITAL_COMMUNITY): Payer: Self-pay | Admitting: Emergency Medicine

## 2017-05-08 ENCOUNTER — Other Ambulatory Visit: Payer: Self-pay

## 2017-05-08 ENCOUNTER — Emergency Department (HOSPITAL_COMMUNITY): Payer: Medicare Other

## 2017-05-08 DIAGNOSIS — J40 Bronchitis, not specified as acute or chronic: Secondary | ICD-10-CM | POA: Diagnosis not present

## 2017-05-08 DIAGNOSIS — I10 Essential (primary) hypertension: Secondary | ICD-10-CM | POA: Insufficient documentation

## 2017-05-08 DIAGNOSIS — J449 Chronic obstructive pulmonary disease, unspecified: Secondary | ICD-10-CM | POA: Insufficient documentation

## 2017-05-08 DIAGNOSIS — Z79899 Other long term (current) drug therapy: Secondary | ICD-10-CM | POA: Diagnosis not present

## 2017-05-08 DIAGNOSIS — R05 Cough: Secondary | ICD-10-CM | POA: Diagnosis not present

## 2017-05-08 DIAGNOSIS — Z7901 Long term (current) use of anticoagulants: Secondary | ICD-10-CM | POA: Insufficient documentation

## 2017-05-08 DIAGNOSIS — Z87891 Personal history of nicotine dependence: Secondary | ICD-10-CM | POA: Insufficient documentation

## 2017-05-08 DIAGNOSIS — R0602 Shortness of breath: Secondary | ICD-10-CM | POA: Diagnosis not present

## 2017-05-08 DIAGNOSIS — R0981 Nasal congestion: Secondary | ICD-10-CM | POA: Diagnosis not present

## 2017-05-08 MED ORDER — ALBUTEROL SULFATE HFA 108 (90 BASE) MCG/ACT IN AERS
2.0000 | INHALATION_SPRAY | Freq: Four times a day (QID) | RESPIRATORY_TRACT | Status: DC
  Administered 2017-05-08: 2 via RESPIRATORY_TRACT
  Filled 2017-05-08: qty 6.7

## 2017-05-08 MED ORDER — DM-GUAIFENESIN ER 30-600 MG PO TB12: 1.0000 | ORAL_TABLET | Freq: Two times a day (BID) | ORAL | 1 refills | Status: DC

## 2017-05-08 NOTE — ED Triage Notes (Signed)
Patient c/o productive cough and chest congestion. Per family patient admitted for CHF in November. Patient does report some shortness of breath and fatigue. Denies any fevers, chest pain, or edema. Per patient thick yellow sputum.

## 2017-05-08 NOTE — ED Provider Notes (Signed)
[facility] EMERGENCY DEPARTMENT Provider Note   CSN: [id number] Arrival date & time: 05/08/17  1342     History   Chief Complaint Chief Complaint  Patient presents with  . Cough    HPI Michael Stephens is a 83 y.o. male.  Patient with 1 week history of congestion cough and now productive cough of yellow sputum.  Is having history of COPD.  Patient at one point in time had congestive heart failure in the past.  Patient denies fever rash nausea vomiting or diarrhea.  Denies sick contact.      Past Medical History:  Diagnosis Date  . Acute diastolic CHF (congestive heart failure) ([place]) 02/11/2017  . Atrial flutter ([place])    a. diagnosed in 02/2017. Rate-control strategy pursued.   . Hypertension   . Renal disorder    cyst on kidney    Patient Active Problem List   Diagnosis Date Noted  . Change in stool 03/18/2017  . Diarrhea 03/02/2017  . Aortic stenosis 03/02/2017  . CAP (community acquired pneumonia) 02/16/2017  . Essential hypertension 02/11/2017  . Demand ischemia ([place]) 02/11/2017  . Acute diastolic CHF (congestive heart failure) ([place]) 02/11/2017  . Acute respiratory failure with hypoxia ([place]) 02/11/2017  . Atrial flutter ([place]) 02/09/2017    Past Surgical History:  Procedure Laterality Date  . ABCESS DRAINAGE  11/2008   BUTTOCKS  . CATARACT EXTRACTION, BILATERAL    . COLONOSCOPY N/A 04/02/2017   Procedure: COLONOSCOPY;  Surgeon: Rogene Houston, MD;  Location: [facility] ENDO SUITE;  Service: Endoscopy;  Laterality: N/A;  10:55  . POLYPECTOMY  04/02/2017   Procedure: POLYPECTOMY;  Surgeon: Rogene Houston, MD;  Location: [facility] ENDO SUITE;  Service: Endoscopy;;  asceding colon(hot snare)/ sigmoid colon times two (cold snare)       Home Medications    Prior to Admission medications   Medication Sig Start Date End Date Taking? Authorizing Provider  apixaban (ELIQUIS) 5 MG TABS tablet Take 1 tablet (5 mg total) by mouth 2 (two) times daily. 04/29/17  Yes Strader,  Fransisco Hertz, PA-C  atorvastatin (LIPITOR) 80 MG tablet Take 1 tablet (80 mg total) daily at 6 PM by mouth. 03/02/17  Yes Strader, [place] M, PA-C  furosemide (LASIX) 20 MG tablet Take 1 tablet (20 mg total) by mouth daily. 03/17/17  Yes Lendon Colonel, NP  hydrochlorothiazide (HYDRODIURIL) 25 MG tablet Take 1 tablet (25 mg total) daily by mouth. 03/02/17  Yes Strader, [place] M, PA-C  metoprolol tartrate (LOPRESSOR) 100 MG tablet Take 1 tablet (100 mg total) 2 (two) times daily by mouth. 03/02/17 05/31/17 Yes Strader, Fransisco Hertz, PA-C  dextromethorphan-guaiFENesin (MUCINEX DM) 30-600 MG 12hr tablet Take 1 tablet by mouth 2 (two) times daily. 05/08/17   Fredia Sorrow, MD    Family History Family History  Problem Relation Age of Onset  . CVA Father   . CAD Father   . Diabetes Sister   . Colon cancer Neg Hx     Social History Social History   Tobacco Use  . Smoking status: Former Smoker    Types: Cigarettes    Last attempt to quit: 2006    Years since quitting: 13.0  . Smokeless tobacco: Never Used  Substance Use Topics  . Alcohol use: No  . Drug use: No     Allergies   Penicillins   Review of Systems Review of Systems  Constitutional: Negative for fever.  HENT: Positive for congestion.   Eyes: Negative for redness.  Respiratory:  Positive for cough and shortness of breath.   Gastrointestinal: Negative for abdominal pain.  Musculoskeletal: Negative for back pain.  Skin: Negative for rash.  Neurological: Negative for syncope and headaches.  Hematological: Does not bruise/bleed easily.  Psychiatric/Behavioral: Negative for confusion.     Physical Exam Updated Vital Signs BP (!) 134/96 (BP Location: Right Arm)   Pulse 81   Temp 98.7 F (37.1 C) (Oral)   Resp (!) 24   Ht 1.829 m (6')   Wt 89.4 kg (197 lb)   SpO2 95%   BMI 26.72 kg/m   Physical Exam  Constitutional: He is oriented to person, place, and time. He appears well-developed and well-nourished.  No distress.  HENT:  Head: Normocephalic and atraumatic.  Mouth/Throat: Oropharynx is clear and moist.  Eyes: Conjunctivae and EOM are normal. Pupils are equal, round, and reactive to light.  Neck: Normal range of motion. Neck supple.  Cardiovascular: Normal rate, regular rhythm and normal heart sounds.  Pulmonary/Chest: Effort normal and breath sounds normal. No respiratory distress. He has no wheezes. He has no rales.  Abdominal: Soft. Bowel sounds are normal. There is no tenderness.  Musculoskeletal: Normal range of motion. He exhibits no edema.  Neurological: He is alert and oriented to person, place, and time. No cranial nerve deficit or sensory deficit. He exhibits normal muscle tone. Coordination normal.  Skin: Skin is warm.  Nursing note and vitals reviewed.    ED Treatments / Results  Labs (all labs ordered are listed, but only abnormal results are displayed) Labs Reviewed - No data to display  EKG  EKG Interpretation None       Radiology Dg Chest 2 View  Result Date: 05/08/2017 CLINICAL DATA:  Cough and shortness of breath for 1 week. EXAM: CHEST  2 VIEW COMPARISON:  03/08/2017 and prior radiographs FINDINGS: Upper limits normal heart size noted. Mild chronic peribronchial thickening and scattered minimal scarring again noted. There is no evidence of focal airspace disease, pulmonary edema, suspicious pulmonary nodule/mass, pleural effusion, or pneumothorax. No acute bony abnormalities are identified. IMPRESSION: No evidence of acute cardiopulmonary disease. Electronically Signed   By: Margarette Canada M.D.   On: 05/08/2017 14:37    Procedures Procedures (including critical care time)  Medications Ordered in ED Medications  albuterol (PROVENTIL HFA;VENTOLIN HFA) 108 (90 Base) MCG/ACT inhaler 2 puff (not administered)     Initial Impression / Assessment and Plan / ED Course  I have reviewed the triage vital signs and the nursing notes.  Pertinent labs & imaging  results that were available during my care of the patient were reviewed by me and considered in my medical decision making (see chart for details).     Chest x-ray negative for pneumonia or evidence of pulmonary edema.  Symptoms consistent with a viral bronchitis.  Patient's had symptoms for about a week.  Nontoxic no acute distress.  Oxygen saturation is normal.  Will treat with albuterol inhaler and Mucinex.  Final Clinical Impressions(s) / ED Diagnoses   Final diagnoses:  Bronchitis    ED Discharge Orders        Ordered    dextromethorphan-guaiFENesin [facility] DM) 30-600 MG 12hr tablet  2 times daily     05/08/17 1915       Fredia Sorrow, MD 05/08/17 1920

## 2017-05-08 NOTE — Discharge Instructions (Signed)
Use albuterol inhaler 2 puffs every 6 hours for the next 7 days.  Take Mucinex DM to reduce the cough and loosen the phlegm.  Today's chest x-ray without evidence of any fluid on the lungs or pneumonia.  Return for any new or worse symptoms.

## 2017-05-14 ENCOUNTER — Ambulatory Visit: Payer: Medicare Other | Admitting: Cardiology

## 2017-05-18 ENCOUNTER — Encounter: Payer: Self-pay | Admitting: Cardiology

## 2017-05-18 ENCOUNTER — Ambulatory Visit (INDEPENDENT_AMBULATORY_CARE_PROVIDER_SITE_OTHER): Payer: Medicare Other | Admitting: Cardiology

## 2017-05-18 VITALS — BP 118/78 | HR 87 | Ht 72.0 in | Wt 203.0 lb

## 2017-05-18 DIAGNOSIS — I5032 Chronic diastolic (congestive) heart failure: Secondary | ICD-10-CM

## 2017-05-18 DIAGNOSIS — I4892 Unspecified atrial flutter: Secondary | ICD-10-CM

## 2017-05-18 DIAGNOSIS — M791 Myalgia, unspecified site: Secondary | ICD-10-CM

## 2017-05-18 DIAGNOSIS — R9439 Abnormal result of other cardiovascular function study: Secondary | ICD-10-CM

## 2017-05-18 MED ORDER — APIXABAN 5 MG PO TABS: 5.0000 mg | ORAL_TABLET | Freq: Two times a day (BID) | ORAL | 0 refills | Status: DC

## 2017-05-18 NOTE — Progress Notes (Signed)
Clinical Summary Mr. Schellhorn is a 83 y.o.male seen today for follow up of the following medical problems.   1. Atrial arrhythmias - 02/2017 admit noted to have afib, aflutter, and MAT  - recent high heart rates related to bronchtitis - compliant with meds. Turned down for assistance for eliquis per his report.    2. Chronic diastolic HF - 47/0962 echo LVEF 50-55%, restrictive diastolic dysfunction  - no recent edema  3. Abnormal stress test - nuclear stress with /interolateral infarct with mild to moderate peri-infarct ischemia.  - no recent chest pain since last visit  4. Muscle aches - on atorvastatin - reports knee and leg pains at rest or with activity.   Past Medical History:  Diagnosis Date  . Acute diastolic CHF (congestive heart failure) ([place]) 02/11/2017  . Atrial flutter ([place])    a. diagnosed in 02/2017. Rate-control strategy pursued.   . Hypertension   . Renal disorder    cyst on kidney     Allergies  Allergen Reactions  . Penicillins     Has patient had a PCN reaction causing immediate rash, facial/tongue/throat swelling, SOB or lightheadedness with hypotension: Yes Has patient had a PCN reaction causing severe rash involving mucus membranes or skin necrosis: No Has patient had a PCN reaction that required hospitalization: No Has patient had a PCN reaction occurring within the last 10 years: No If all of the above answers are "NO", then may proceed with Cephalosporin use.     Current Outpatient Medications  Medication Sig Dispense Refill  . apixaban (ELIQUIS) 5 MG TABS tablet Take 1 tablet (5 mg total) by mouth 2 (two) times daily. 28 tablet 0  . atorvastatin (LIPITOR) 80 MG tablet Take 1 tablet (80 mg total) daily at 6 PM by mouth. 90 tablet 3  . furosemide (LASIX) 20 MG tablet Take 1 tablet (20 mg total) by mouth daily. 40 tablet 11  . hydrochlorothiazide (HYDRODIURIL) 25 MG tablet Take 1 tablet (25 mg total) daily by mouth. 90 tablet 3  .  metoprolol tartrate (LOPRESSOR) 100 MG tablet Take 1 tablet (100 mg total) 2 (two) times daily by mouth. 180 tablet 3   No current facility-administered medications for this visit.      Past Surgical History:  Procedure Laterality Date  . ABCESS DRAINAGE  11/2008   BUTTOCKS  . CATARACT EXTRACTION, BILATERAL    . COLONOSCOPY N/A 04/02/2017   Procedure: COLONOSCOPY;  Surgeon: Rogene Houston, MD;  [facility];  Service: Endoscopy;  Laterality: N/A;  10:55  . POLYPECTOMY  04/02/2017   Procedure: POLYPECTOMY;  Surgeon: Rogene Houston, MD;  Location: [facility];  Service: Endoscopy;;  asceding colon(hot snare)/ sigmoid colon times two (cold snare)     Allergies  Allergen Reactions  . Penicillins     Has patient had a PCN reaction causing immediate rash, facial/tongue/throat swelling, SOB or lightheadedness with hypotension: Yes Has patient had a PCN reaction causing severe rash involving mucus membranes or skin necrosis: No Has patient had a PCN reaction that required hospitalization: No Has patient had a PCN reaction occurring within the last 10 years: No If all of the above answers are "NO", then may proceed with Cephalosporin use.      Family History  Problem Relation Age of Onset  . CVA Father   . CAD Father   . Diabetes Sister   . Colon cancer Neg Hx      Social History Mr. Ralston  reports that he quit smoking about 13 years ago. His smoking use included cigarettes. he has never used smokeless tobacco. Mr. Waynick reports that he does not drink alcohol.   Review of Systems CONSTITUTIONAL: No weight loss, fever, chills, weakness or fatigue.  HEENT: Eyes: No visual loss, blurred vision, double vision or yellow sclerae.No hearing loss, sneezing, congestion, runny nose or sore throat.  SKIN: No rash or itching.  CARDIOVASCULAR: per hpi RESPIRATORY: No shortness of breath, cough or sputum.  GASTROINTESTINAL: No anorexia, nausea, vomiting or diarrhea. No  abdominal pain or blood.  GENITOURINARY: No burning on urination, no polyuria NEUROLOGICAL: No headache, dizziness, syncope, paralysis, ataxia, numbness or tingling in the extremities. No change in bowel or bladder control.  MUSCULOSKELETAL: leg pains LYMPHATICS: No enlarged nodes. No history of splenectomy.  PSYCHIATRIC: No history of depression or anxiety.  ENDOCRINOLOGIC: No reports of sweating, cold or heat intolerance. No polyuria or polydipsia.  Marland Kitchen   Physical Examination Vitals:   05/18/17 1001  BP: 118/78  Pulse: 87  SpO2: 97%   Filed Weights   05/18/17 1001  Weight: 203 lb (92.1 kg)    Gen: resting comfortably, no acute distress HEENT: no scleral icterus, pupils equal round and reactive, no palptable cervical adenopathy,  CV: RRR, no m/r/g, no jvd Resp: Clear to auscultation bilaterally GI: abdomen is soft, non-tender, non-distended, normal bowel sounds, no hepatosplenomegaly MSK: extremities are warm, no edema.  Skin: warm, no rash Neuro:  no focal deficits Psych: appropriate affect   Diagnostic Studies  02/2017 nuclear stress  There was no ST segment deviation noted during stress.  Findings consistent with prior inferior/inferolateral myocardial infarction with mild to moderate peri-infarct ischemia.  This is a high risk study. High risk based on decreased LVEF. Based on prior infarct and current level of ischemia alone this would sugest low to intermediate risk . Recommend correlate findings with echo  The left ventricular ejection fraction is moderately decreased (30-44%).  01/2017 echo Study Conclusions  - Left ventricle: The cavity size was normal. Wall thickness was   increased in a pattern of mild LVH. Systolic function was normal.   The estimated ejection fraction was in the range of 50% to 55%.   Doppler parameters are consistent with restrictive physiology,   indicative of decreased left ventricular diastolic compliance   and/or increased left  atrial pressure. Doppler parameters are   consistent with high ventricular filling pressure. - Aortic valve: Mildly calcified annulus. Trileaflet; mildly   thickened leaflets. There was very mild stenosis. Valve area   (VTI): 1.59 cm^2. Valve area (Vmax): 1.73 cm^2. - Left atrium: The atrium was severely dilated. - Right atrium: The atrium was mildly dilated. - Atrial septum: No defect or patent foramen ovale was identified. - Inferior vena cava: The vessel was dilated. The respirophasic   diameter changes were blunted (< 50%), consistent with elevated   central venous pressure. - Technically difficult study.   Assessment and Plan  1. Atrial arrhythmias - no recent symptoms - continue current meds including anticoag. We will check with his pharmacy, appears he has to meet a $400 deductible before his medication assistance kicks in and then his cost should decrease for anticoagulation  2. Chronic diastolic HF - appears euvolemic and no symptoms, continue to monitor - stop HCTZ since on lasix.   3. Abnormal stress test - no symptoms, continue medical therapy at this time  4. Muscle aches - try holding lipitor x 1 month, call us with update  Arnoldo Lenis, M.D.

## 2017-05-18 NOTE — Patient Instructions (Signed)
Your physician recommends that you schedule a follow-up appointment in: Michael Stephens has recommended you make the following change in your medication:   STOP HCTZ  HOLD LIPITOR FOR 1 MONTH AND CALL TO UPDATE [place] ON YOUR SYMPTOMS   [facility] PHONE [place]   Thank you for choosing [facility]!!

## 2017-05-21 ENCOUNTER — Encounter: Payer: Self-pay | Admitting: Cardiology

## 2017-05-29 ENCOUNTER — Telehealth: Payer: Self-pay | Admitting: Cardiology

## 2017-05-29 MED ORDER — APIXABAN 5 MG PO TABS: 5.0000 mg | ORAL_TABLET | Freq: Two times a day (BID) | ORAL | 0 refills | Status: DC

## 2017-05-29 MED ORDER — APIXABAN 5 MG PO TABS: 5.0000 mg | ORAL_TABLET | Freq: Two times a day (BID) | ORAL | 6 refills | Status: DC

## 2017-05-29 NOTE — Telephone Encounter (Signed)
Asking for Eliquis samples  °

## 2017-05-29 NOTE — Telephone Encounter (Signed)
Wife Joaquim Lai) notified samples ready for pick up.  They have not had prescription sent to pharmacy since it is a new plan year.  Will send new rx to [facility] & go from there.

## 2017-06-12 ENCOUNTER — Other Ambulatory Visit: Payer: Self-pay | Admitting: *Deleted

## 2017-06-12 MED ORDER — APIXABAN 5 MG PO TABS: 5.0000 mg | ORAL_TABLET | Freq: Two times a day (BID) | ORAL | 0 refills | Status: DC

## 2017-07-06 ENCOUNTER — Telehealth: Payer: Self-pay | Admitting: Cardiology

## 2017-07-06 MED ORDER — APIXABAN 5 MG PO TABS: 5.0000 mg | ORAL_TABLET | Freq: Two times a day (BID) | ORAL | 0 refills | Status: DC

## 2017-07-06 NOTE — Telephone Encounter (Signed)
Asking for Eliquis samples  Please call patient in regards to Eliquis. States that they cannot afford this medication.

## 2017-07-06 NOTE — Telephone Encounter (Signed)
Samples available for pickup  

## 2017-07-07 NOTE — Telephone Encounter (Signed)
Patient has questiions about what to do

## 2017-07-07 NOTE — Telephone Encounter (Signed)
Pt wanted to know when could pick up samples - will come later this afternoon to pick up

## 2017-07-30 ENCOUNTER — Telehealth: Payer: Self-pay | Admitting: Cardiology

## 2017-07-30 MED ORDER — APIXABAN 5 MG PO TABS: 5.0000 mg | ORAL_TABLET | Freq: Two times a day (BID) | ORAL | 0 refills | Status: DC

## 2017-07-30 NOTE — Telephone Encounter (Signed)
Per wife, do not qualify for any assistance due to the fact the he has insurance coverage & between her & him - they make too much money.  Not sure what else can be done.  Patient has follow up scheduled for 08/26/2017 with Dr. Harl Bowie & will readdress this issue with him.  Will ask about changing to Warfarin.

## 2017-07-30 NOTE — Telephone Encounter (Signed)
Eliquis Samples needed

## 2017-07-30 NOTE — Telephone Encounter (Signed)
Michael Stephens (wife) notified samples ready for pickup.

## 2017-08-05 ENCOUNTER — Ambulatory Visit: Payer: Medicare Other | Admitting: Family Medicine

## 2017-08-12 DIAGNOSIS — I4891 Unspecified atrial fibrillation: Secondary | ICD-10-CM | POA: Diagnosis not present

## 2017-08-12 DIAGNOSIS — M1711 Unilateral primary osteoarthritis, right knee: Secondary | ICD-10-CM | POA: Diagnosis not present

## 2017-08-12 DIAGNOSIS — I509 Heart failure, unspecified: Secondary | ICD-10-CM | POA: Diagnosis not present

## 2017-08-12 DIAGNOSIS — Z6829 Body mass index (BMI) 29.0-29.9, adult: Secondary | ICD-10-CM | POA: Diagnosis not present

## 2017-08-12 DIAGNOSIS — I4892 Unspecified atrial flutter: Secondary | ICD-10-CM | POA: Diagnosis not present

## 2017-08-12 DIAGNOSIS — E782 Mixed hyperlipidemia: Secondary | ICD-10-CM | POA: Diagnosis not present

## 2017-08-12 DIAGNOSIS — M1712 Unilateral primary osteoarthritis, left knee: Secondary | ICD-10-CM | POA: Diagnosis not present

## 2017-08-26 ENCOUNTER — Other Ambulatory Visit: Payer: Self-pay

## 2017-08-26 ENCOUNTER — Ambulatory Visit (INDEPENDENT_AMBULATORY_CARE_PROVIDER_SITE_OTHER): Payer: Medicare Other | Admitting: Cardiology

## 2017-08-26 ENCOUNTER — Encounter: Payer: Self-pay | Admitting: Cardiology

## 2017-08-26 VITALS — BP 93/66 | HR 85 | Ht 72.0 in | Wt 210.0 lb

## 2017-08-26 DIAGNOSIS — I10 Essential (primary) hypertension: Secondary | ICD-10-CM

## 2017-08-26 DIAGNOSIS — I5032 Chronic diastolic (congestive) heart failure: Secondary | ICD-10-CM

## 2017-08-26 DIAGNOSIS — I4891 Unspecified atrial fibrillation: Secondary | ICD-10-CM

## 2017-08-26 DIAGNOSIS — E782 Mixed hyperlipidemia: Secondary | ICD-10-CM | POA: Diagnosis not present

## 2017-08-26 MED ORDER — FUROSEMIDE 40 MG PO TABS: 40.0000 mg | ORAL_TABLET | Freq: Every day | ORAL | 1 refills | Status: DC

## 2017-08-26 MED ORDER — PRAVASTATIN SODIUM 20 MG PO TABS: 20.0000 mg | ORAL_TABLET | Freq: Every evening | ORAL | 1 refills | Status: DC

## 2017-08-26 NOTE — Patient Instructions (Addendum)
Your physician recommends that you schedule a follow-up appointment in: [place]   Your physician has recommended you make the following change in your medication:   STOP HCTZ  STOP ATORVASTATIN   START PRAVASTATIN 20 MG DAILY   INCREASE LASIX 40 MG DAILY   Your physician recommends that you return for lab work in: 2 WEEKS BMP/MG   Low-Sodium Eating Plan Sodium, which is an element that makes up salt, helps you maintain a healthy balance of fluids in your body. Too much sodium can increase your blood pressure and cause fluid and waste to be held in your body. Your health care provider or dietitian may recommend following this plan if you have high blood pressure (hypertension), kidney disease, liver disease, or heart failure. Eating less sodium can help lower your blood pressure, reduce swelling, and protect your heart, liver, and kidneys. What are tips for following this plan? General guidelines  Most people on this plan should limit their sodium intake to 1,500-2,000 mg (milligrams) of sodium each day. Reading food labels  The Nutrition Facts label lists the amount of sodium in one serving of the food. If you eat more than one serving, you must multiply the listed amount of sodium by the number of servings.  Choose foods with less than 140 mg of sodium per serving.  Avoid foods with 300 mg of sodium or more per serving. Shopping  Look for lower-sodium products, often labeled as "low-sodium" or "no salt added."  Always check the sodium content even if foods are labeled as "unsalted" or "no salt added".  Buy fresh foods. ? Avoid canned foods and premade or frozen meals. ? Avoid canned, cured, or processed meats  Buy breads that have less than 80 mg of sodium per slice. Cooking  Eat more home-cooked food and less restaurant, buffet, and fast food.  Avoid adding salt when cooking. Use salt-free seasonings or herbs  instead of table salt or sea salt. Check with your health care provider or pharmacist before using salt substitutes.  Cook with plant-based oils, such as canola, sunflower, or olive oil. Meal planning  When eating at a restaurant, ask that your food be prepared with less salt or no salt, if possible.  Avoid foods that contain MSG (monosodium glutamate). MSG is sometimes added to [place] food, bouillon, and some canned foods. What foods are recommended? The items listed may not be a complete list. Talk with your dietitian about what dietary choices are best for you. Grains Low-sodium cereals, including oats, puffed wheat and rice, and shredded wheat. Low-sodium crackers. Unsalted rice. Unsalted pasta. Low-sodium bread. Whole-grain breads and whole-grain pasta. Vegetables Fresh or frozen vegetables. "No salt added" canned vegetables. "No salt added" tomato sauce and paste. Low-sodium or reduced-sodium tomato and vegetable juice. Fruits Fresh, frozen, or canned fruit. Fruit juice. Meats and other protein foods Fresh or frozen (no salt added) meat, poultry, seafood, and fish. Low-sodium canned tuna and salmon. Unsalted nuts. Dried peas, beans, and lentils without added salt. Unsalted canned beans. Eggs. Unsalted nut butters. Dairy Milk. Soy milk. Cheese that is naturally low in sodium, such as ricotta cheese, fresh mozzarella, or Swiss cheese Low-sodium or reduced-sodium cheese. Cream cheese. Yogurt. Fats and oils Unsalted butter. Unsalted margarine with no trans fat. Vegetable oils such as canola or olive oils. Seasonings and other foods Fresh and dried herbs and spices. Salt-free seasonings. Low-sodium mustard and ketchup. Sodium-free salad dressing. Sodium-free light  mayonnaise. Fresh or refrigerated horseradish. Lemon juice. Vinegar. Homemade, reduced-sodium, or low-sodium soups. Unsalted popcorn and pretzels. Low-salt or salt-free chips. What foods are not recommended? The items listed may  not be a complete list. Talk with your dietitian about what dietary choices are best for you. Grains Instant hot cereals. Bread stuffing, pancake, and biscuit mixes. Croutons. Seasoned rice or pasta mixes. Noodle soup cups. Boxed or frozen macaroni and cheese. Regular salted crackers. Self-rising flour. Vegetables Sauerkraut, pickled vegetables, and relishes. Olives. [place] fries. Onion rings. Regular canned vegetables (not low-sodium or reduced-sodium). Regular canned tomato sauce and paste (not low-sodium or reduced-sodium). Regular tomato and vegetable juice (not low-sodium or reduced-sodium). Frozen vegetables in sauces. Meats and other protein foods Meat or fish that is salted, canned, smoked, spiced, or pickled. Bacon, ham, sausage, hotdogs, corned beef, chipped beef, packaged lunch meats, salt pork, jerky, pickled herring, anchovies, regular canned tuna, sardines, salted nuts. Dairy Processed cheese and cheese spreads. Cheese curds. Blue cheese. Feta cheese. String cheese. Regular cottage cheese. Buttermilk. Canned milk. Fats and oils Salted butter. Regular margarine. Ghee. Bacon fat. Seasonings and other foods Onion salt, garlic salt, seasoned salt, table salt, and sea salt. Canned and packaged gravies. Worcestershire sauce. Tartar sauce. Barbecue sauce. Teriyaki sauce. Soy sauce, including reduced-sodium. Steak sauce. Fish sauce. Oyster sauce. Cocktail sauce. Horseradish that you find on the shelf. Regular ketchup and mustard. Meat flavorings and tenderizers. Bouillon cubes. Hot sauce and Tabasco sauce. Premade or packaged marinades. Premade or packaged taco seasonings. Relishes. Regular salad dressings. Salsa. Potato and tortilla chips. Corn chips and puffs. Salted popcorn and pretzels. Canned or dried soups. Pizza. Frozen entrees and pot pies. Summary  Eating less sodium can help lower your blood pressure, reduce swelling, and protect your heart, liver, and kidneys.  Most people on this  plan should limit their sodium intake to 1,500-2,000 mg (milligrams) of sodium each day.  Canned, boxed, and frozen foods are high in sodium. Restaurant foods, fast foods, and pizza are also very high in sodium. You also get sodium by adding salt to food.  Try to cook at home, eat more fresh fruits and vegetables, and eat less fast food, canned, processed, or prepared foods. This information is not intended to replace advice given to you by your health care provider. Make sure you discuss any questions you have with your health care provider. Document Released: 09/20/2001 Document Revised: 03/24/2016 Document Reviewed: 03/24/2016 Elsevier Interactive Patient Education  2018 [employer].   Thank you for choosing [facility]!!

## 2017-08-26 NOTE — Progress Notes (Signed)
Clinical Summary Michael Stephens is a 83 y.o.male seen today for follow up of the following medical problems.   1. Atrial arrhythmias - 02/2017 admit noted to have afib, aflutter, and MAT  - no recent palpitations - no bleeding on eliquis. Has had issues affording eliquis.    2. Chronic diastolic HF - 32/3557 echo LVEF 50-55%, restrictive diastolic dysfunction  - has had some LE edema. Feeling SOB. No orthopnea - weight up 7 lbs 210 since last visit.    3. Abnormal stress test - nuclear stress with /interolateral infarct with mild to moderate peri-infarct ischemia.  - no recent ches tpain.   4. Muscle aches - on atorvastatin - reports knee and leg pains at rest or with activity.   - improved off statin  .  Past Medical History:  Diagnosis Date  . Acute diastolic CHF (congestive heart failure) ([place]) 02/11/2017  . Atrial flutter ([place])    a. diagnosed in 02/2017. Rate-control strategy pursued.   . Hypertension   . Renal disorder    cyst on kidney     Allergies  Allergen Reactions  . Penicillins     Has patient had a PCN reaction causing immediate rash, facial/tongue/throat swelling, SOB or lightheadedness with hypotension: Yes Has patient had a PCN reaction causing severe rash involving mucus membranes or skin necrosis: No Has patient had a PCN reaction that required hospitalization: No Has patient had a PCN reaction occurring within the last 10 years: No If all of the above answers are "NO", then may proceed with Cephalosporin use.     Current Outpatient Medications  Medication Sig Dispense Refill  . apixaban (ELIQUIS) 5 MG TABS tablet Take 1 tablet (5 mg total) by mouth 2 (two) times daily. 56 tablet 0  . atorvastatin (LIPITOR) 80 MG tablet Take 1 tablet (80 mg total) daily at 6 PM by mouth. 90 tablet 3  . furosemide (LASIX) 20 MG tablet Take 1 tablet (20 mg total) by mouth daily. 40 tablet 11  . metoprolol tartrate (LOPRESSOR) 100 MG tablet Take 1  tablet (100 mg total) 2 (two) times daily by mouth. 180 tablet 3   No current facility-administered medications for this visit.      Past Surgical History:  Procedure Laterality Date  . ABCESS DRAINAGE  11/2008   BUTTOCKS  . CATARACT EXTRACTION, BILATERAL    . COLONOSCOPY N/A 04/02/2017   Procedure: COLONOSCOPY;  Surgeon: Rogene Houston, MD;  [facility];  Service: Endoscopy;  Laterality: N/A;  10:55  . POLYPECTOMY  04/02/2017   Procedure: POLYPECTOMY;  Surgeon: Rogene Houston, MD;  [facility];  Service: Endoscopy;;  asceding colon(hot snare)/ sigmoid colon times two (cold snare)     Allergies  Allergen Reactions  . Penicillins     Has patient had a PCN reaction causing immediate rash, facial/tongue/throat swelling, SOB or lightheadedness with hypotension: Yes Has patient had a PCN reaction causing severe rash involving mucus membranes or skin necrosis: No Has patient had a PCN reaction that required hospitalization: No Has patient had a PCN reaction occurring within the last 10 years: No If all of the above answers are "NO", then may proceed with Cephalosporin use.      Family History  Problem Relation Age of Onset  . CVA Father   . CAD Father   . Diabetes Sister   . Colon cancer Neg Hx      Social History Michael Stephens reports that he quit  smoking about 13 years ago. His smoking use included cigarettes. He has never used smokeless tobacco. Michael Stephens reports that he does not drink alcohol.   Review of Systems CONSTITUTIONAL: No weight loss, fever, chills, weakness or fatigue.  HEENT: Eyes: No visual loss, blurred vision, double vision or yellow sclerae.No hearing loss, sneezing, congestion, runny nose or sore throat.  SKIN: No rash or itching.  CARDIOVASCULAR: per hpi RESPIRATORY: per hpi  GASTROINTESTINAL: No anorexia, nausea, vomiting or diarrhea. No abdominal pain or blood.  GENITOURINARY: No burning on urination, no  polyuria NEUROLOGICAL: No headache, dizziness, syncope, paralysis, ataxia, numbness or tingling in the extremities. No change in bowel or bladder control.  MUSCULOSKELETAL: No muscle, back pain, joint pain or stiffness.  LYMPHATICS: No enlarged nodes. No history of splenectomy.  PSYCHIATRIC: No history of depression or anxiety.  ENDOCRINOLOGIC: No reports of sweating, cold or heat intolerance. No polyuria or polydipsia.  Marland Kitchen   Physical Examination Vitals:   08/26/17 0951  BP: 93/66  Pulse: 85  SpO2: 94%   Vitals:   08/26/17 0951  Weight: 210 lb (95.3 kg)  Height: 6' (1.829 m)    Gen: resting comfortably, no acute distress HEENT: no scleral icterus, pupils equal round and reactive, no palptable cervical adenopathy,  CV: RRR, no mrg, no jvd Resp: Clear to auscultation bilaterally GI: abdomen is soft, non-tender, non-distended, normal bowel sounds, no hepatosplenomegaly MSK: extremities are warm, no edema.  Skin: warm, no rash Neuro:  no focal deficits Psych: appropriate affect   Diagnostic Studies 02/2017 nuclear stress  There was no ST segment deviation noted during stress.  Findings consistent with prior inferior/inferolateral myocardial infarction with mild to moderate peri-infarct ischemia.  This is a high risk study. High risk based on decreased LVEF. Based on prior infarct and current level of ischemia alone this would sugest low to intermediate risk . Recommend correlate findings with echo  The left ventricular ejection fraction is moderately decreased (30-44%).  01/2017 echo Study Conclusions  - Left ventricle: The cavity size was normal. Wall thickness was increased in a pattern of mild LVH. Systolic function was normal. The estimated ejection fraction was in the range of 50% to 55%. Doppler parameters are consistent with restrictive physiology, indicative of decreased left ventricular diastolic compliance and/or increased left atrial pressure.  Doppler parameters are consistent with high ventricular filling pressure. - Aortic valve: Mildly calcified annulus. Trileaflet; mildly thickened leaflets. There was very mild stenosis. Valve area (VTI): 1.59 cm^2. Valve area (Vmax): 1.73 cm^2. - Left atrium: The atrium was severely dilated. - Right atrium: The atrium was mildly dilated. - Atrial septum: No defect or patent foramen ovale was identified. - Inferior vena cava: The vessel was dilated. The respirophasic diameter changes were blunted (<50%), consistent with elevated central venous pressure. - Technically difficult study.     Assessment and Plan  1. Afib - no symptoms - check on cost on eliquis and xarelto, if too expensive will have to change to coumadin.   2. Chronic diastolic HF - increased weight gain and symptoms - increase lasix to 40mg  daily, stop HCTZ. Check BMET/Mg in 2 weeks  3. Abnormal stress test - no symptoms - we will ocnitnue to monitor at this time.   4. Muscle aches/Hyperlipidemia - improved off atorvastatin. Will try pravastatin 40mg  daily  to see if tolerated.      F/u 2 months   Arnoldo Lenis, M.D.

## 2017-08-27 ENCOUNTER — Ambulatory Visit (INDEPENDENT_AMBULATORY_CARE_PROVIDER_SITE_OTHER): Payer: Medicare Other | Admitting: *Deleted

## 2017-08-27 DIAGNOSIS — Z5181 Encounter for therapeutic drug level monitoring: Secondary | ICD-10-CM | POA: Diagnosis not present

## 2017-08-27 DIAGNOSIS — I4892 Unspecified atrial flutter: Secondary | ICD-10-CM | POA: Diagnosis not present

## 2017-08-27 LAB — POCT INR: INR: 1.3

## 2017-08-27 MED ORDER — WARFARIN SODIUM 5 MG PO TABS
5.0000 mg | ORAL_TABLET | Freq: Every day | ORAL | 3 refills | Status: DC
Start: 2017-08-27 — End: 2018-03-06

## 2017-08-27 NOTE — Patient Instructions (Signed)
Pt here to change from Eliquis to coumadin due to cost.  Pt will finish Eliquis on Sunday.  Told pt to start coumadin 5mg  daily on Friday night and overlap with Eliquis Friday, Saturday and Sunday.  Monday continue with coumadin only.   Recheck in 1 week.

## 2017-08-31 ENCOUNTER — Encounter: Payer: Self-pay | Admitting: Cardiology

## 2017-09-03 ENCOUNTER — Ambulatory Visit (INDEPENDENT_AMBULATORY_CARE_PROVIDER_SITE_OTHER): Payer: Medicare Other | Admitting: *Deleted

## 2017-09-03 DIAGNOSIS — I4892 Unspecified atrial flutter: Secondary | ICD-10-CM | POA: Diagnosis not present

## 2017-09-03 DIAGNOSIS — Z5181 Encounter for therapeutic drug level monitoring: Secondary | ICD-10-CM | POA: Diagnosis not present

## 2017-09-03 LAB — POCT INR: INR: 1.8 — AB (ref 2.0–3.0)

## 2017-09-03 NOTE — Patient Instructions (Signed)
Has had 5mg  coumadin x 6 days Continue coumadin 1 tablet daily.  Recheck in 1 week.

## 2017-09-04 ENCOUNTER — Other Ambulatory Visit: Payer: Self-pay

## 2017-09-04 ENCOUNTER — Emergency Department (HOSPITAL_COMMUNITY): Payer: Medicare Other

## 2017-09-04 ENCOUNTER — Inpatient Hospital Stay (HOSPITAL_COMMUNITY)
Admission: EM | Admit: 2017-09-04 | Discharge: 2017-09-08 | DRG: 291 | Disposition: A | Discharge status: Home / Self Care | Payer: Medicare Other | Attending: Family Medicine | Admitting: Family Medicine

## 2017-09-04 ENCOUNTER — Encounter (HOSPITAL_COMMUNITY): Payer: Self-pay | Admitting: Emergency Medicine

## 2017-09-04 DIAGNOSIS — K5900 Constipation, unspecified: Secondary | ICD-10-CM | POA: Diagnosis present

## 2017-09-04 DIAGNOSIS — Z79899 Other long term (current) drug therapy: Secondary | ICD-10-CM | POA: Diagnosis not present

## 2017-09-04 DIAGNOSIS — I110 Hypertensive heart disease with heart failure: Secondary | ICD-10-CM | POA: Diagnosis present

## 2017-09-04 DIAGNOSIS — Z87891 Personal history of nicotine dependence: Secondary | ICD-10-CM

## 2017-09-04 DIAGNOSIS — K5909 Other constipation: Secondary | ICD-10-CM | POA: Diagnosis present

## 2017-09-04 DIAGNOSIS — Z9111 Patient's noncompliance with dietary regimen: Secondary | ICD-10-CM

## 2017-09-04 DIAGNOSIS — K219 Gastro-esophageal reflux disease without esophagitis: Secondary | ICD-10-CM | POA: Diagnosis present

## 2017-09-04 DIAGNOSIS — R0602 Shortness of breath: Secondary | ICD-10-CM | POA: Diagnosis not present

## 2017-09-04 DIAGNOSIS — I509 Heart failure, unspecified: Secondary | ICD-10-CM

## 2017-09-04 DIAGNOSIS — R339 Retention of urine, unspecified: Secondary | ICD-10-CM | POA: Diagnosis not present

## 2017-09-04 DIAGNOSIS — D649 Anemia, unspecified: Secondary | ICD-10-CM

## 2017-09-04 DIAGNOSIS — I4892 Unspecified atrial flutter: Secondary | ICD-10-CM | POA: Diagnosis not present

## 2017-09-04 DIAGNOSIS — R109 Unspecified abdominal pain: Secondary | ICD-10-CM | POA: Diagnosis not present

## 2017-09-04 DIAGNOSIS — J9601 Acute respiratory failure with hypoxia: Secondary | ICD-10-CM | POA: Diagnosis not present

## 2017-09-04 DIAGNOSIS — Z7901 Long term (current) use of anticoagulants: Secondary | ICD-10-CM

## 2017-09-04 DIAGNOSIS — I10 Essential (primary) hypertension: Secondary | ICD-10-CM | POA: Diagnosis not present

## 2017-09-04 DIAGNOSIS — I5033 Acute on chronic diastolic (congestive) heart failure: Secondary | ICD-10-CM | POA: Diagnosis not present

## 2017-09-04 DIAGNOSIS — I4891 Unspecified atrial fibrillation: Secondary | ICD-10-CM | POA: Diagnosis present

## 2017-09-04 LAB — CBC WITH DIFFERENTIAL/PLATELET
BASOS PCT: 0 %
Basophils Absolute: 0 10*3/uL (ref 0.0–0.1)
EOS PCT: 1 %
Eosinophils Absolute: 0.1 10*3/uL (ref 0.0–0.7)
HCT: 38 % — ABNORMAL LOW (ref 39.0–52.0)
Hemoglobin: 12.2 g/dL — ABNORMAL LOW (ref 13.0–17.0)
Lymphocytes Relative: 11 %
Lymphs Abs: 1 10*3/uL (ref 0.7–4.0)
MCH: 30.6 pg (ref 26.0–34.0)
MCHC: 32.1 g/dL (ref 30.0–36.0)
MCV: 95.2 fL (ref 78.0–100.0)
MONO ABS: 1.3 10*3/uL — AB (ref 0.1–1.0)
Monocytes Relative: 14 %
Neutro Abs: 6.6 10*3/uL (ref 1.7–7.7)
Neutrophils Relative %: 74 %
PLATELETS: 207 10*3/uL (ref 150–400)
RBC: 3.99 MIL/uL — ABNORMAL LOW (ref 4.22–5.81)
RDW: 14.9 % (ref 11.5–15.5)
WBC: 9.1 10*3/uL (ref 4.0–10.5)

## 2017-09-04 LAB — BASIC METABOLIC PANEL
ANION GAP: 8 (ref 5–15)
BUN: 29 mg/dL — ABNORMAL HIGH (ref 6–20)
CHLORIDE: 100 mmol/L — AB (ref 101–111)
CO2: 28 mmol/L (ref 22–32)
CREATININE: 0.91 mg/dL (ref 0.61–1.24)
Calcium: 9.7 mg/dL (ref 8.9–10.3)
GFR calc non Af Amer: >60 mL/min (ref >60)
Glucose, Bld: 198 mg/dL — ABNORMAL HIGH (ref 65–99)
Potassium: 3.8 mmol/L (ref 3.5–5.1)
SODIUM: 136 mmol/L (ref 135–145)

## 2017-09-04 LAB — PROTIME-INR
INR: 1.84
Prothrombin Time: 21.1 seconds — ABNORMAL HIGH (ref 11.4–15.2)

## 2017-09-04 LAB — TROPONIN I
TROPONIN I: 0.16 ng/mL — AB (ref <0.03)
Troponin I: 0.13 ng/mL (ref <0.03)
Troponin I: 0.1 ng/mL (ref <0.03)

## 2017-09-04 LAB — MRSA PCR SCREENING: MRSA by PCR: NEGATIVE

## 2017-09-04 LAB — BRAIN NATRIURETIC PEPTIDE: B NATRIURETIC PEPTIDE 5: 620 pg/mL — AB (ref 0.0–100.0)

## 2017-09-04 MED ORDER — WARFARIN SODIUM 7.5 MG PO TABS
7.5000 mg | ORAL_TABLET | Freq: Once | ORAL | Status: AC
  Administered 2017-09-04: 7.5 mg via ORAL
  Filled 2017-09-04: qty 1

## 2017-09-04 MED ORDER — METOPROLOL TARTRATE 50 MG PO TABS
100.0000 mg | ORAL_TABLET | Freq: Once | ORAL | Status: AC
  Administered 2017-09-04: 100 mg via ORAL
  Filled 2017-09-04: qty 2

## 2017-09-04 MED ORDER — POLYETHYLENE GLYCOL 3350 17 G PO PACK
17.0000 g | PACK | Freq: Every day | ORAL | Status: DC
  Administered 2017-09-05: 17 g via ORAL
  Filled 2017-09-04: qty 1

## 2017-09-04 MED ORDER — PRAVASTATIN SODIUM 10 MG PO TABS
20.0000 mg | ORAL_TABLET | Freq: Every evening | ORAL | Status: DC
  Administered 2017-09-04 – 2017-09-07 (×4): 20 mg via ORAL
  Filled 2017-09-04 (×4): qty 2

## 2017-09-04 MED ORDER — DILTIAZEM LOAD VIA INFUSION
15.0000 mg | Freq: Once | INTRAVENOUS | Status: AC
  Administered 2017-09-04: 15 mg via INTRAVENOUS
  Filled 2017-09-04: qty 15

## 2017-09-04 MED ORDER — DILTIAZEM HCL-DEXTROSE 100-5 MG/100ML-% IV SOLN (PREMIX)
5.0000 mg/h | INTRAVENOUS | Status: DC
  Administered 2017-09-04: 5 mg/h via INTRAVENOUS
  Filled 2017-09-04: qty 100

## 2017-09-04 MED ORDER — ACETAMINOPHEN 325 MG PO TABS: 650.0000 mg | ORAL_TABLET | ORAL | Status: DC | PRN

## 2017-09-04 MED ORDER — WARFARIN - PHARMACIST DOSING INPATIENT
Freq: Every day | Status: DC
  Administered 2017-09-04 – 2017-09-06 (×3)

## 2017-09-04 MED ORDER — PRAVASTATIN SODIUM 20 MG PO TABS: 20.0000 mg | ORAL_TABLET | Freq: Every day | ORAL | Status: DC

## 2017-09-04 MED ORDER — METOPROLOL TARTRATE 50 MG PO TABS
100.0000 mg | ORAL_TABLET | Freq: Two times a day (BID) | ORAL | Status: DC
  Administered 2017-09-04 – 2017-09-08 (×8): 100 mg via ORAL
  Filled 2017-09-04 (×8): qty 2

## 2017-09-04 MED ORDER — SODIUM CHLORIDE 0.9% FLUSH
3.0000 mL | Freq: Two times a day (BID) | INTRAVENOUS | Status: DC
  Administered 2017-09-04 – 2017-09-08 (×9): 3 mL via INTRAVENOUS

## 2017-09-04 MED ORDER — SODIUM CHLORIDE 0.9 % IV SOLN: 250.0000 mL | INTRAVENOUS | Status: DC | PRN

## 2017-09-04 MED ORDER — SODIUM CHLORIDE 0.9% FLUSH: 3.0000 mL | INTRAVENOUS | Status: DC | PRN

## 2017-09-04 MED ORDER — DILTIAZEM HCL 30 MG PO TABS
30.0000 mg | ORAL_TABLET | Freq: Three times a day (TID) | ORAL | Status: AC
  Administered 2017-09-04 – 2017-09-07 (×9): 30 mg via ORAL
  Filled 2017-09-04 (×9): qty 1

## 2017-09-04 MED ORDER — ONDANSETRON HCL 4 MG/2ML IJ SOLN: 4.0000 mg | Freq: Four times a day (QID) | INTRAMUSCULAR | Status: DC | PRN

## 2017-09-04 MED ORDER — FUROSEMIDE 10 MG/ML IJ SOLN
40.0000 mg | Freq: Two times a day (BID) | INTRAMUSCULAR | Status: DC
  Administered 2017-09-04 – 2017-09-06 (×4): 40 mg via INTRAVENOUS
  Filled 2017-09-04 (×4): qty 4

## 2017-09-04 MED ORDER — LOSARTAN POTASSIUM 50 MG PO TABS
25.0000 mg | ORAL_TABLET | Freq: Every day | ORAL | Status: DC
  Administered 2017-09-04 – 2017-09-08 (×5): 25 mg via ORAL
  Filled 2017-09-04 (×5): qty 1

## 2017-09-04 MED ORDER — FUROSEMIDE 10 MG/ML IJ SOLN
40.0000 mg | Freq: Once | INTRAMUSCULAR | Status: AC
  Administered 2017-09-04: 40 mg via INTRAVENOUS
  Filled 2017-09-04: qty 4

## 2017-09-04 NOTE — H&P (Signed)
History and Physical    Michael Stephens ONG:[id number] DOB: 18-Jan-1935 DOA: 09/04/2017  PCP: [facility], [facility]  Patient coming from: Home  I have personally briefly reviewed patient's old medical records in [place]  Chief Complaint: Shortness of breath  HPI: Michael Stephens is a 83 y.o. male with medical history significant of diastolic heart failure, hypertension, atrial flutter on anticoagulation, presents to the hospital with complaint shortness of breath.  Patient reports he had progressive shortness of breath for the past few months.  He recently  saw his cardiologist where Lasix dose was increased.  Overnight, his shortness of breath got substantially worse.  Denies any chest pain.  He reports compliance with medications.  He is also noticed worsening lower extremity edema.  On further questioning, he admits to noncompliance with sodium intake as well as fluid intake.  He was also having significant indigestion last night.  He drank 2 cups of coffee and subsequently vomited.  He has not had any diarrhea, in fact stays more constipated.  ED Course: In the emergency room, he was noted to be hypoxic on room air.  He was also tachycardic with atrial flutter.  Started on Cardizem infusion.  Chest x-ray showed decompensated CHF and he was started on intravenous Lasix.  He is been referred for admission.  Review of Systems: As per HPI otherwise 10 point review of systems negative.    Past Medical History:  Diagnosis Date  . Acute diastolic CHF (congestive heart failure) ([place]) 02/11/2017  . Atrial flutter ([place])    a. diagnosed in 02/2017. Rate-control strategy pursued.   . Hypertension   . Renal disorder    cyst on kidney    Past Surgical History:  Procedure Laterality Date  . ABCESS DRAINAGE  11/2008   BUTTOCKS  . CATARACT EXTRACTION, BILATERAL    . COLONOSCOPY N/A 04/02/2017   Procedure: COLONOSCOPY;  Surgeon: Rogene Houston, MD;  Location: [facility];  Service:  Endoscopy;  Laterality: N/A;  10:55  . POLYPECTOMY  04/02/2017   Procedure: POLYPECTOMY;  Surgeon: Rogene Houston, MD;  Location: [facility];  Service: Endoscopy;;  asceding colon(hot snare)/ sigmoid colon times two (cold snare)     reports that he quit smoking about 13 years ago. His smoking use included cigarettes. He has never used smokeless tobacco. He reports that he does not drink alcohol or use drugs.  Allergies  Allergen Reactions  . Penicillins     Has patient had a PCN reaction causing immediate rash, facial/tongue/throat swelling, SOB or lightheadedness with hypotension: Yes Has patient had a PCN reaction causing severe rash involving mucus membranes or skin necrosis: No Has patient had a PCN reaction that required hospitalization: No Has patient had a PCN reaction occurring within the last 10 years: No If all of the above answers are "NO", then may proceed with Cephalosporin use.    Family History  Problem Relation Age of Onset  . CVA Father   . CAD Father   . Diabetes Sister   . Colon cancer Neg Hx     Prior to Admission medications   Medication Sig Start Date End Date Taking? Authorizing Provider  furosemide (LASIX) 40 MG tablet Take 1 tablet (40 mg total) by mouth daily. 08/26/17 11/24/17  Arnoldo Lenis, MD  metoprolol tartrate (LOPRESSOR) 100 MG tablet Take 100 mg by mouth 2 (two) times daily.    [provider]  pravastatin (PRAVACHOL) 20 MG tablet Take 1 tablet (20 mg total)  by mouth every evening. 08/26/17 11/24/17  Arnoldo Lenis, MD  warfarin (COUMADIN) 5 MG tablet Take 1 tablet (5 mg total) by mouth daily. 08/27/17   Arnoldo Lenis, MD    Physical Exam: Vitals:   09/04/17 0818 09/04/17 0856 09/04/17 0900 09/04/17 1000  BP:   (!) 143/96 128/77  Pulse: 67  84 93  Resp: 16  (!) 31 (!) 22  Temp:  98 F (36.7 C)    TempSrc:      SpO2: 98%  98% 95%  Weight:  94.5 kg (208 lb 5.4 oz)    Height:  6' (1.829 m)      Constitutional: NAD,  calm, comfortable Vitals:   09/04/17 0818 09/04/17 0856 09/04/17 0900 09/04/17 1000  BP:   (!) 143/96 128/77  Pulse: 67  84 93  Resp: 16  (!) 31 (!) 22  Temp:  98 F (36.7 C)    TempSrc:      SpO2: 98%  98% 95%  Weight:  94.5 kg (208 lb 5.4 oz)    Height:  6' (1.829 m)     Eyes: PERRL, lids and conjunctivae normal ENMT: Mucous membranes are moist. Posterior pharynx clear of any exudate or lesions.Normal dentition.  Neck: normal, supple, no masses, no thyromegaly Respiratory: Crackles at bases. Normal respiratory effort. No accessory muscle use.  Cardiovascular: Regular rate and rhythm, no murmurs / rubs / gallops.  1-2+ extremity edema. 2+ pedal pulses. No carotid bruits.  Abdomen: no tenderness, no masses palpated. No hepatosplenomegaly. Bowel sounds positive.  Musculoskeletal: no clubbing / cyanosis. No joint deformity upper and lower extremities. Good ROM, no contractures. Normal muscle tone.  Skin: no rashes, lesions, ulcers. No induration Neurologic: CN 2-12 grossly intact. Sensation intact, DTR normal. Strength 5/5 in all 4.  Psychiatric: Normal judgment and insight. Alert and oriented x 3. Normal mood.    Labs on Admission: I have personally reviewed following labs and imaging studies  CBC: Recent Labs  Lab 09/04/17 0648  WBC 9.1  NEUTROABS 6.6  HGB 12.2*  HCT 38.0*  MCV 95.2  PLT 099   Basic Metabolic Panel: Recent Labs  Lab 09/04/17 0648  NA 136  K 3.8  CL 100*  CO2 28  GLUCOSE 198*  BUN 29*  CREATININE 0.91  CALCIUM 9.7   GFR: Estimated Creatinine Clearance: 76 mL/min (by C-G formula based on SCr of 0.91 mg/dL). Liver Function Tests: No results for input(s): AST, ALT, ALKPHOS, BILITOT, PROT, ALBUMIN in the last 168 hours. No results for input(s): LIPASE, AMYLASE in the last 168 hours. No results for input(s): AMMONIA in the last 168 hours. Coagulation Profile: Recent Labs  Lab 09/03/17 1036 09/04/17 0648  INR 1.8* 1.84   Cardiac  Enzymes: Recent Labs  Lab 09/04/17 0648  TROPONINI <0.03   BNP (last 3 results) No results for input(s): PROBNP in the last 8760 hours. HbA1C: No results for input(s): HGBA1C in the last 72 hours. CBG: No results for input(s): GLUCAP in the last 168 hours. Lipid Profile: No results for input(s): CHOL, HDL, LDLCALC, TRIG, CHOLHDL, LDLDIRECT in the last 72 hours. Thyroid Function Tests: No results for input(s): TSH, T4TOTAL, FREET4, T3FREE, THYROIDAB in the last 72 hours. Anemia Panel: No results for input(s): VITAMINB12, FOLATE, FERRITIN, TIBC, IRON, RETICCTPCT in the last 72 hours. Urine analysis: No results found for: COLORURINE, APPEARANCEUR, LABSPEC, PHURINE, GLUCOSEU, HGBUR, BILIRUBINUR, KETONESUR, PROTEINUR, UROBILINOGEN, NITRITE, LEUKOCYTESUR  Radiological Exams on Admission: Dg Chest Port 1 View  Result Date:  09/04/2017 CLINICAL DATA:  Shortness of breath. EXAM: PORTABLE CHEST 1 VIEW COMPARISON:  Two-view chest x-ray 05/08/2017 FINDINGS: Heart is enlarged. Diffuse interstitial pattern is present. Bibasilar opacities are noted. No significant airspace consolidation is present otherwise. IMPRESSION: 1. Cardiomegaly with increased interstitial pattern compatible with congestive heart failure. 2. Minimal bibasilar opacities likely reflect atelectasis. Electronically Signed   By: San Morelle M.D.   On: 09/04/2017 07:16    EKG: Independently reviewed.  Atrial flutter with no acute changes  Assessment/Plan Active Problems:   Atrial flutter (HCC)   Essential hypertension   Acute respiratory failure with hypoxia (HCC)   CHF exacerbation (HCC)   Acute on chronic diastolic CHF (congestive heart failure) ([place])     1. Acute respiratory failure with hypoxia.  Related to decompensated CHF.  Wean off oxygen as tolerated. 2. Acute on chronic diastolic congestive heart failure.  Suspect this is related to noncompliance with sodium intake as well as fluid intake.  Start on  intravenous Lasix.  He is already on beta-blockers.  Started on low-dose losartan.  Echocardiogram was recently done in 01/2017 showed preserved ejection fraction with diastolic dysfunction.  Will request nutrition consult for diet education 3. Atrial flutter.  Started on Cardizem infusion.  Appears to have since converted to sinus rhythm.  Continue on home dose of metoprolol.  Monitor in stepdown in case he reverts to atrial flutter.  He is anticoagulated with Coumadin.  Was previously taking Eliquis, but could not afford this. 4. Hypertension.  Blood pressure stable.  Continue on metoprolol.  DVT prophylaxis:  Coumadin Code Status: full code  Family Communication: discussed with wife at the bedside  Disposition Plan: discharge home once improved  Consults called:   Admission status: inpatient, stepdown   Kathie Dike MD [facility] Pager [phone]  If 7PM-7AM, please contact night-coverage [email or web address] Password [email or web address]  09/04/2017, 11:02 AM

## 2017-09-04 NOTE — Plan of Care (Addendum)
Nutrition Education Note RD consulted for nutrition education regarding  CHF.  Begun by emphasizing why diet adherence was important ie decreased reliance on diuretics, stay out of hospital, more energy, less SOB etc.  RD provided "Heart Failure Nutrition Therapy" handout from the Academy of [employer] and [employer]etics. Reviewed patient's dietary recall. Dietary recall included:  24 hour diet recall: Morning: instant grits with butter, coffee with creamer (no sugar) Midday: biscuits with grape jelly Evening: salisbury steak, instant mac n' cheese, pinto beans Fluids throughout: 6-7 cups of coffee and 18-20 oz water  Pt was a poor historian, often getting distracted by side stories and diet recall may not be 100% accurate or all inclusive.  In addition to the 24 hour diet recall, pt also mentioned they eat canned vegetables, canned soups, ice cream, hot dogs, burgers, and that they eat out 4-5 times per week, sometimes eating at home and then going out to eat again in the mornings.    Provided examples on ways to decrease sodium intake in diet including buying low sodium canned foods, eating out on a less frequent basis, and making foods at home.   Discouraged intake of processed foods and use of salt shaker. Pt mentioned he does not use salt shaker.  Encouraged fresh fruits and vegetables as well as whole grain sources of carbohydrates to maximize fiber intake.  Pt said that his wife does all the grocery shopping and cooking and that she uses fresh foods sometimes, but that they use canned foods often. He endorses eating canned soup  Pt sounds to be overconsuming fluid; his main source of fluids is coffee. He endorses drinking 6-7 cups per day, in addition to 20 oz water, but does say that water/coffee intake is variable. He says he has been instructed to reduce fluids to <2 L/day. Gave pt obtainable goal of no more than 4-5 cups/coffee per day.   RD discussed why it is important for patient to  adhere to diet recommendations, and emphasized the role of fluids, foods to avoid, and importance of weighing self daily. Teach back method used.  Summary Of Reccomendations 1. Decrease eating out to 2-3x/wk 2. Reduce cups of coffee from 6-7 to 4-5/day. Continue drinking 20oz water.  3. Avoid canned soups, frozen meals, Deli meats 4. Weigh self-daily  Expect POOR compliance.  Pt stated that he, and especially his wife, who does all the cooking, knows he needs to lower his sodium and fluid intake, but pt verbalized that however much his wife may express her concern about his health, she "cannot control him" and that he eats what he wants.    No further nutrition interventions warranted at this time.  If additional nutrition issues arise, please re-consult RD.

## 2017-09-04 NOTE — Progress Notes (Signed)
ANTICOAGULATION CONSULT NOTE - Initial Consult  Pharmacy Consult for warfarin  Indication: atrial fibrillation  Allergies  Allergen Reactions  . Penicillins     Has patient had a PCN reaction causing immediate rash, facial/tongue/throat swelling, SOB or lightheadedness with hypotension: Yes Has patient had a PCN reaction causing severe rash involving mucus membranes or skin necrosis: No Has patient had a PCN reaction that required hospitalization: No Has patient had a PCN reaction occurring within the last 10 years: No If all of the above answers are "NO", then may proceed with Cephalosporin use.    Patient Measurements: Height: 6' (182.9 cm) Weight: 208 lb 5.4 oz (94.5 kg) IBW/kg (Calculated) : 77.6 Heparin Dosing Weight: n/a  Vital Signs: Temp: 97.8 F (36.6 C) (05/24 1129) Temp Source: Oral (05/24 1129) BP: 128/77 (05/24 1000) Pulse Rate: 93 (05/24 1129)  Labs: Recent Labs    09/03/17 1036 09/04/17 0648 09/04/17 1113  HGB  --  12.2*  --   HCT  --  38.0*  --   PLT  --  207  --   LABPROT  --  21.1*  --   INR 1.8* 1.84  --   CREATININE  --  0.91  --   TROPONINI  --  <0.03 0.16*    Estimated Creatinine Clearance: 76 mL/min (by C-G formula based on SCr of 0.91 mg/dL).   Medical History: Past Medical History:  Diagnosis Date  . Acute diastolic CHF (congestive heart failure) ([place]) 02/11/2017  . Atrial flutter ([place])    a. diagnosed in 02/2017. Rate-control strategy pursued.   . Hypertension   . Renal disorder    cyst on kidney    Medications:  Scheduled:  . furosemide  40 mg Intravenous BID  . losartan  25 mg Oral Daily  . metoprolol tartrate  100 mg Oral BID  . polyethylene glycol  17 g Oral Daily  . pravastatin  20 mg Oral QPM  . sodium chloride flush  3 mL Intravenous Q12H    Assessment: This is an 83 yo male who was taking warfarin 5mg  daily  at home for atrial flutter.   Goal of Therapy:  INR 2-3 Monitor platelets by anticoagulation protocol:  Yes   Plan: give warfarin 7.5mg  today (1.5 x home dose of 5mg  daily)  Monitor CBC, INR and patient progress   Despina Pole 09/04/2017,2:21 PM

## 2017-09-04 NOTE — ED Notes (Signed)
Dr Eber Jones called about the patients ekg. No new orders at this time

## 2017-09-04 NOTE — ED Provider Notes (Signed)
[facility] EMERGENCY DEPARTMENT Provider Note   CSN: [id number] Arrival date & time: 09/04/17  3500     History   Chief Complaint Chief Complaint  Patient presents with  . Shortness of Breath    HPI Michael Stephens is a 83 y.o. male.  The history is provided by the patient.  He has history of hypertension, diastolic heart failure, atrial flutter and is anticoagulated on warfarin.  He comes in with dyspnea.  He states that he is always dyspneic, but it got worse last night.  He was able to lay down and get to sleep, but woke up several times.  He denies chest pain, heaviness, tightness, pressure.  He has not noticed any swelling.  He had seen his cardiologist 1 week ago and had his furosemide dose increased from 20 mg a day to 40 mg a day because of weight gain.  He has not weighed himself since then.  He denies fever, cough  Past Medical History:  Diagnosis Date  . Acute diastolic CHF (congestive heart failure) ([place]) 02/11/2017  . Atrial flutter ([place])    a. diagnosed in 02/2017. Rate-control strategy pursued.   . Hypertension   . Renal disorder    cyst on kidney    Patient Active Problem List   Diagnosis Date Noted  . Encounter for therapeutic drug monitoring 08/27/2017  . Change in stool 03/18/2017  . Diarrhea 03/02/2017  . Aortic stenosis 03/02/2017  . CAP (community acquired pneumonia) 02/16/2017  . Essential hypertension 02/11/2017  . Demand ischemia ([place]) 02/11/2017  . Acute diastolic CHF (congestive heart failure) ([place]) 02/11/2017  . Acute respiratory failure with hypoxia ([place]) 02/11/2017  . Atrial flutter ([place]) 02/09/2017    Past Surgical History:  Procedure Laterality Date  . ABCESS DRAINAGE  11/2008   BUTTOCKS  . CATARACT EXTRACTION, BILATERAL    . COLONOSCOPY N/A 04/02/2017   Procedure: COLONOSCOPY;  Surgeon: Rogene Houston, MD;  [facility];  Service: Endoscopy;  Laterality: N/A;  10:55  . POLYPECTOMY  04/02/2017   Procedure: POLYPECTOMY;   Surgeon: Rogene Houston, MD;  Location[facility];  Service: Endoscopy;;  asceding colon(hot snare)/ sigmoid colon times two (cold snare)        Home Medications    Prior to Admission medications   Medication Sig Start Date End Date Taking? Authorizing Provider  apixaban (ELIQUIS) 5 MG TABS tablet Take 1 tablet (5 mg total) by mouth 2 (two) times daily. 07/30/17   Arnoldo Lenis, MD  furosemide (LASIX) 40 MG tablet Take 1 tablet (40 mg total) by mouth daily. 08/26/17 11/24/17  Arnoldo Lenis, MD  metoprolol tartrate (LOPRESSOR) 100 MG tablet Take 100 mg by mouth 2 (two) times daily.    [provider]  pravastatin (PRAVACHOL) 20 MG tablet Take 1 tablet (20 mg total) by mouth every evening. 08/26/17 11/24/17  Arnoldo Lenis, MD  warfarin (COUMADIN) 5 MG tablet Take 1 tablet (5 mg total) by mouth daily. 08/27/17   Arnoldo Lenis, MD    Family History Family History  Problem Relation Age of Onset  . CVA Father   . CAD Father   . Diabetes Sister   . Colon cancer Neg Hx     Social History Social History   Tobacco Use  . Smoking status: Former Smoker    Types: Cigarettes    Last attempt to quit: 2006    Years since quitting: 13.4  . Smokeless tobacco: Never Used  Substance  Use Topics  . Alcohol use: No  . Drug use: No     Allergies   Penicillins   Review of Systems Review of Systems  All other systems reviewed and are negative.    Physical Exam Updated Vital Signs BP (!) 158/118 (BP Location: Left Arm)   Pulse (!) 112   Temp 98.2 F (36.8 C) (Oral)   Resp (!) 35   Ht 6' (1.829 m)   Wt 88.5 kg (195 lb)   SpO2 96%   BMI 26.45 kg/m   Physical Exam  Nursing note and vitals reviewed.  83 year old male, resting comfortably and in no acute distress. Vital signs are significant for elevated blood pressure, elevated heart rate, elevated respiratory rate. Oxygen saturation is 96%, which is normal. Head is normocephalic and atraumatic.  PERRLA, EOMI. Oropharynx is clear. Neck is nontender and supple without adenopathy. JVD is present. Back is nontender and there is no CVA tenderness.  There is 2+ presacral edema. Lungs have bibasilar rales without wheezes or rhonchi. Chest is nontender. Heart is tachycardic and somewhat irregular without murmur. Abdomen is soft, flat, nontender without masses or hepatosplenomegaly and peristalsis is normoactive. Extremities have 1+ edema, full range of motion is present. Skin is warm and dry without rash. Neurologic: Mental status is normal, cranial nerves are intact, there are no motor or sensory deficits.  ED Treatments / Results  Labs (all labs ordered are listed, but only abnormal results are displayed) Labs Reviewed  BASIC METABOLIC PANEL - Abnormal; Notable for the following components:      Result Value   Chloride 100 (*)    Glucose, Bld 198 (*)    BUN 29 (*)    All other components within normal limits  BRAIN NATRIURETIC PEPTIDE - Abnormal; Notable for the following components:   B Natriuretic Peptide 620.0 (*)    All other components within normal limits  CBC WITH DIFFERENTIAL/PLATELET - Abnormal; Notable for the following components:   RBC 3.99 (*)    Hemoglobin 12.2 (*)    HCT 38.0 (*)    Monocytes Absolute 1.3 (*)    All other components within normal limits  PROTIME-INR - Abnormal; Notable for the following components:   Prothrombin Time 21.1 (*)    All other components within normal limits  TROPONIN I    EKG EKG Interpretation  Date/Time:  Friday Sep 04 2017 06:38:23 EDT Ventricular Rate:  118 PR Interval:    QRS Duration: 104 QT Interval:  330 QTC Calculation: 463 R Axis:   52 Text Interpretation:  Atrial flutter Low voltage, precordial leads Nonspecific repol abnormality, inferior leads Baseline wander in lead(s) II III aVF V6 When compared with ECG of 03/08/2017, HEART RATE has increased Premature ventricular complexes are no longer present Confirmed by  Delora Fuel ([id number]) on 09/04/2017 7:02:21 AM   Radiology Dg Chest Port 1 View  Result Date: 09/04/2017 CLINICAL DATA:  Shortness of breath. EXAM: PORTABLE CHEST 1 VIEW COMPARISON:  Two-view chest x-ray 05/08/2017 FINDINGS: Heart is enlarged. Diffuse interstitial pattern is present. Bibasilar opacities are noted. No significant airspace consolidation is present otherwise. IMPRESSION: 1. Cardiomegaly with increased interstitial pattern compatible with congestive heart failure. 2. Minimal bibasilar opacities likely reflect atelectasis. Electronically Signed   By: San Morelle M.D.   On: 09/04/2017 07:16    Procedures Procedures  CRITICAL CARE Performed by: Delora Fuel Total critical care time: 60 minutes Critical care time was exclusive of separately billable procedures and treating other patients. Critical  care was necessary to treat or prevent imminent or life-threatening deterioration. Critical care was time spent personally by me on the following activities: development of treatment plan with patient and/or surrogate as well as nursing, discussions with consultants, evaluation of patient's response to treatment, examination of patient, obtaining history from patient or surrogate, ordering and performing treatments and interventions, ordering and review of laboratory studies, ordering and review of radiographic studies, pulse oximetry and re-evaluation of patient's condition.  Medications Ordered in ED Medications  diltiazem (CARDIZEM) 1 mg/mL load via infusion 15 mg (15 mg Intravenous Bolus from Bag 09/04/17 0705)    And  diltiazem (CARDIZEM) 100 mg in dextrose 5% 14mL (1 mg/mL) infusion (5 mg/hr Intravenous New Bag/Given 09/04/17 0704)  metoprolol tartrate (LOPRESSOR) tablet 100 mg (has no administration in time range)     Initial Impression / Assessment and Plan / ED Course  I have reviewed the triage vital signs and the nursing notes.  Pertinent labs & imaging results that  were available during my care of the patient were reviewed by me and considered in my medical decision making (see chart for details).  Dyspnea with clinical findings strongly suggestive of congestive heart failure.  Monitor shows atrial flutter/fibrillation with poorly controlled rate.  This may be contributing to his heart failure.  Old records are reviewed, and he does have chronic atrial flutter treated with rate control.  Recent INR was subtherapeutic.  Cardiologist office visit on May 15 noted increased weight but without any rales or peripheral edema.  He clearly has had significant fluid retention since then and is given some intravenous furosemide.  ED work-up has been initiated.  Because of tachycardia, he is given diltiazem for rate control.  This also should help with blood pressure control.  Heart rate has come down with diltiazem.  Chest x-ray shows CHF.  BNP is moderately elevated.  INR is borderline subtherapeutic.  Mild anemia is present, unchanged from baseline.  Renal function is normal.  Case is discussed with Dr. Roderic Palau of [facility], who agrees to admit the patient.  Final Clinical Impressions(s) / ED Diagnoses   Final diagnoses:  Acute on chronic diastolic heart failure ([place])  Atrial flutter with rapid ventricular response (HCC)  Normochromic normocytic anemia  Anticoagulated on warfarin    ED Discharge Orders    None       Delora Fuel, MD 41/66/06 [phone]

## 2017-09-04 NOTE — ED Triage Notes (Signed)
Pt reports sob for several days, worse today.  Pt denies  Cp or other complaints

## 2017-09-05 LAB — BASIC METABOLIC PANEL
ANION GAP: 9 (ref 5–15)
BUN: 27 mg/dL — ABNORMAL HIGH (ref 6–20)
CO2: 31 mmol/L (ref 22–32)
Calcium: 9 mg/dL (ref 8.9–10.3)
Chloride: 97 mmol/L — ABNORMAL LOW (ref 101–111)
Creatinine, Ser: 0.84 mg/dL (ref 0.61–1.24)
GFR calc Af Amer: >60 mL/min (ref >60)
GFR calc non Af Amer: >60 mL/min (ref >60)
GLUCOSE: 122 mg/dL — AB (ref 65–99)
POTASSIUM: 3.1 mmol/L — AB (ref 3.5–5.1)
Sodium: 137 mmol/L (ref 135–145)

## 2017-09-05 LAB — CBC
HCT: 34.8 % — ABNORMAL LOW (ref 39.0–52.0)
HEMOGLOBIN: 11.2 g/dL — AB (ref 13.0–17.0)
MCH: 30.8 pg (ref 26.0–34.0)
MCHC: 32.2 g/dL (ref 30.0–36.0)
MCV: 95.6 fL (ref 78.0–100.0)
Platelets: 193 10*3/uL (ref 150–400)
RBC: 3.64 MIL/uL — ABNORMAL LOW (ref 4.22–5.81)
RDW: 14.8 % (ref 11.5–15.5)
WBC: 8.1 10*3/uL (ref 4.0–10.5)

## 2017-09-05 LAB — PROTIME-INR
INR: 2.34
Prothrombin Time: 25.4 seconds — ABNORMAL HIGH (ref 11.4–15.2)

## 2017-09-05 LAB — MAGNESIUM: Magnesium: 2 mg/dL (ref 1.7–2.4)

## 2017-09-05 MED ORDER — POTASSIUM CHLORIDE CRYS ER 20 MEQ PO TBCR
40.0000 meq | EXTENDED_RELEASE_TABLET | ORAL | Status: AC
  Administered 2017-09-05 (×3): 40 meq via ORAL
  Filled 2017-09-05 (×2): qty 2

## 2017-09-05 MED ORDER — WARFARIN SODIUM 2.5 MG PO TABS
2.5000 mg | ORAL_TABLET | Freq: Once | ORAL | Status: AC
  Administered 2017-09-05: 2.5 mg via ORAL
  Filled 2017-09-05: qty 1

## 2017-09-05 NOTE — Progress Notes (Signed)
[place] for warfarin  Indication: atrial fibrillation    Patient Measurements: Height: 6' (182.9 cm) Weight: 204 lb 5.9 oz (92.7 kg) IBW/kg (Calculated) : 77.6 Heparin Dosing Weight: n/a  Vital Signs: Temp: 98.7 F (37.1 C) (05/25 0730) Temp Source: Oral (05/25 0730) BP: 133/77 (05/25 0618) Pulse Rate: 100 (05/25 0730)  Labs: Recent Labs    09/03/17 1036  09/04/17 0648 09/04/17 1113 09/04/17 1729 09/04/17 2251 09/05/17 0408  HGB  --   --  12.2*  --   --   --  11.2*  HCT  --   --  38.0*  --   --   --  34.8*  PLT  --   --  207  --   --   --  193  LABPROT  --   --  21.1*  --   --   --  25.4*  INR 1.8*  --  1.84  --   --   --  2.34  CREATININE  --   --  0.91  --   --   --  0.84  TROPONINI  --    < > <0.03 0.16* 0.13* 0.10*  --    < > = values in this interval not displayed.    Estimated Creatinine Clearance: 75.7 mL/min (by C-G formula based on SCr of 0.84 mg/dL).     Assessment: INR bumped by 0.5 with yesterday's dose of 7.5mg     Goal of Therapy:  INR 2-3 Monitor platelets by anticoagulation protocol: Yes   Plan: give warfarin 2.5mg  today  Monitor CBC, INR and patient progress   Despina Pole 09/05/2017,9:20 AM

## 2017-09-05 NOTE — Progress Notes (Signed)
PROGRESS NOTE    Michael Stephens  QVZ:[id number] DOB: [birth year] DOA: 09/04/2017 PCP: [facility]    Brief Narrative:  83 year old male admitted to the hospital with shortness of breath and found to be in decompensated CHF.  His rapid atrial flutter and was initially placed on Cardizem infusion, but this is been weaned off.  He is currently receiving intravenous diuresis and management of his heart rate with metoprolol and diltiazem.   Assessment & Plan:   Active Problems:   Atrial flutter (HCC)   Essential hypertension   Acute respiratory failure with hypoxia (HCC)   CHF exacerbation (HCC)   Acute on chronic diastolic CHF (congestive heart failure) ([place])   1. Acute respiratory failure with hypoxia.  Due to acute decompensated CHF.  Continue to wean off oxygen as tolerated. 2. Acute on chronic diastolic congestive heart failure.  Related to noncompliance with sodium intake as well as fluid intake.  Continues to have evidence of volume overload.  Net volume status is -2.3 L.  Continue on intravenous Lasix.  Continue on beta-blockers.  Started on low-dose losartan. 3. Atrial flutter.  Currently on metoprolol twice daily.  Heart rate is mildly elevated.  Oral Cardizem has also been ordered.  He is anticoagulated with Coumadin.  Continue to follow heart rates. 4. Hypertension.  Blood pressure stable.  Continue on metoprolol   DVT prophylaxis: Warfarin Code Status: Full code Family Communication: Discussed with wife at the bedside Disposition Plan: Discharge home once adequately diuresed   Consultants:     Procedures:     Antimicrobials:       Subjective: Feels about the same as yesterday.  Still has some shortness of breath on exertion.  No chest pain.  Objective: Vitals:   09/05/17 1500 09/05/17 1600 09/05/17 1700 09/05/17 1800  BP: 132/83 129/89 (!) 138/92 114/84  Pulse: 96 95 96 72  Resp: (!) 30 (!) 28  19  Temp:  99 F (37.2 C)    TempSrc:  Oral     SpO2: 93% 91% 97% 90%  Weight:      Height:        Intake/Output Summary (Last 24 hours) at 09/05/2017 1852 Last data filed at 09/05/2017 1600 Gross per 24 hour  Intake 1080 ml  Output 3050 ml  Net -1970 ml   Filed Weights   09/04/17 0639 09/04/17 0856 09/05/17 0400  Weight: 88.5 kg (195 lb) 94.5 kg (208 lb 5.4 oz) 92.7 kg (204 lb 5.9 oz)    Examination:  General exam: Appears calm and comfortable  Respiratory system: Crackles at bases. Respiratory effort normal. Cardiovascular system: S1, S2, irregular no JVD, murmurs, rubs, gallops or clicks.  1+ pedal edema. Gastrointestinal system: Abdomen is nondistended, soft and nontender. No organomegaly or masses felt. Normal bowel sounds heard. Central nervous system: Alert and oriented. No focal neurological deficits. Extremities: Symmetric 5 x 5 power. Skin: No rashes, lesions or ulcers Psychiatry: Judgement and insight appear normal. Mood & affect appropriate.     Data Reviewed: I have personally reviewed following labs and imaging studies  CBC: Recent Labs  Lab 09/04/17 0648 09/05/17 0408  WBC 9.1 8.1  NEUTROABS 6.6  --   HGB 12.2* 11.2*  HCT 38.0* 34.8*  MCV 95.2 95.6  PLT 207 329   Basic Metabolic Panel: Recent Labs  Lab 09/04/17 0648 09/05/17 0408  NA 136 137  K 3.8 3.1*  CL 100* 97*  CO2 28 31  GLUCOSE 198* 122*  BUN 29* 27*  CREATININE 0.91 0.84  CALCIUM 9.7 9.0  MG  --  2.0   GFR: Estimated Creatinine Clearance: 75.7 mL/min (by C-G formula based on SCr of 0.84 mg/dL). Liver Function Tests: No results for input(s): AST, ALT, ALKPHOS, BILITOT, PROT, ALBUMIN in the last 168 hours. No results for input(s): LIPASE, AMYLASE in the last 168 hours. No results for input(s): AMMONIA in the last 168 hours. Coagulation Profile: Recent Labs  Lab 09/03/17 1036 09/04/17 0648 09/05/17 0408  INR 1.8* 1.84 2.34   Cardiac Enzymes: Recent Labs  Lab 09/04/17 0648 09/04/17 1113 09/04/17 1729 09/04/17 2251    TROPONINI <0.03 0.16* 0.13* 0.10*   BNP (last 3 results) No results for input(s): PROBNP in the last 8760 hours. HbA1C: No results for input(s): HGBA1C in the last 72 hours. CBG: No results for input(s): GLUCAP in the last 168 hours. Lipid Profile: No results for input(s): CHOL, HDL, LDLCALC, TRIG, CHOLHDL, LDLDIRECT in the last 72 hours. Thyroid Function Tests: No results for input(s): TSH, T4TOTAL, FREET4, T3FREE, THYROIDAB in the last 72 hours. Anemia Panel: No results for input(s): VITAMINB12, FOLATE, FERRITIN, TIBC, IRON, RETICCTPCT in the last 72 hours. Sepsis Labs: No results for input(s): PROCALCITON, LATICACIDVEN in the last 168 hours.  Recent Results (from the past 240 hour(s))  MRSA PCR Screening     Status: None   Collection Time: 09/04/17  9:16 AM  Result Value Ref Range Status   MRSA by PCR NEGATIVE NEGATIVE Final    Comment:        The GeneXpert MRSA Assay (FDA approved for NASAL specimens only), is one component of a comprehensive MRSA colonization surveillance program. It is not intended to diagnose MRSA infection nor to guide or monitor treatment for MRSA infections. Performed at [facility], [address], [place], [place] [address]          Radiology Studies: Dg Chest [facility] 1 View  Result Date: 09/04/2017 CLINICAL DATA:  Shortness of breath. EXAM: PORTABLE CHEST 1 VIEW COMPARISON:  Two-view chest x-ray 05/08/2017 FINDINGS: Heart is enlarged. Diffuse interstitial pattern is present. Bibasilar opacities are noted. No significant airspace consolidation is present otherwise. IMPRESSION: 1. Cardiomegaly with increased interstitial pattern compatible with congestive heart failure. 2. Minimal bibasilar opacities likely reflect atelectasis. Electronically Signed   By: San Morelle M.D.   On: 09/04/2017 07:16        Scheduled Meds: . diltiazem  30 mg Oral Q8H  . furosemide  40 mg Intravenous BID  . losartan  25 mg Oral Daily  . metoprolol  tartrate  100 mg Oral BID  . polyethylene glycol  17 g Oral Daily  . pravastatin  20 mg Oral QPM  . sodium chloride flush  3 mL Intravenous Q12H  . Warfarin - Pharmacist Dosing Inpatient   Does not apply Daily   Continuous Infusions: . sodium chloride       LOS: 1 day    Time spent: 30 minutes    Kathie Dike, MD [facility] Pager [phone]  If 7PM-7AM, please contact night-coverage [email or web address] Password [facility] 09/05/2017, 6:52 PM

## 2017-09-06 DIAGNOSIS — I509 Heart failure, unspecified: Secondary | ICD-10-CM

## 2017-09-06 LAB — BASIC METABOLIC PANEL
Anion gap: 8 (ref 5–15)
BUN: 31 mg/dL — AB (ref 6–20)
CALCIUM: 9.2 mg/dL (ref 8.9–10.3)
CO2: 29 mmol/L (ref 22–32)
Chloride: 101 mmol/L (ref 101–111)
Creatinine, Ser: 0.77 mg/dL (ref 0.61–1.24)
GFR calc Af Amer: >60 mL/min (ref >60)
GLUCOSE: 119 mg/dL — AB (ref 65–99)
Potassium: 4.1 mmol/L (ref 3.5–5.1)
Sodium: 138 mmol/L (ref 135–145)

## 2017-09-06 LAB — CBC
HCT: 35.9 % — ABNORMAL LOW (ref 39.0–52.0)
Hemoglobin: 11.5 g/dL — ABNORMAL LOW (ref 13.0–17.0)
MCH: 30.8 pg (ref 26.0–34.0)
MCHC: 32 g/dL (ref 30.0–36.0)
MCV: 96.2 fL (ref 78.0–100.0)
Platelets: 216 10*3/uL (ref 150–400)
RBC: 3.73 MIL/uL — ABNORMAL LOW (ref 4.22–5.81)
RDW: 15 % (ref 11.5–15.5)
WBC: 7.9 10*3/uL (ref 4.0–10.5)

## 2017-09-06 LAB — PROTIME-INR
INR: 2.83
PROTHROMBIN TIME: 29.5 s — AB (ref 11.4–15.2)

## 2017-09-06 MED ORDER — WARFARIN SODIUM 1 MG PO TABS
1.0000 mg | ORAL_TABLET | Freq: Once | ORAL | Status: AC
  Administered 2017-09-06: 1 mg via ORAL
  Filled 2017-09-06: qty 1

## 2017-09-06 MED ORDER — CALCIUM CARBONATE ANTACID 500 MG PO CHEW
400.0000 mg | CHEWABLE_TABLET | Freq: Two times a day (BID) | ORAL | Status: DC | PRN
  Administered 2017-09-06: 400 mg via ORAL
  Filled 2017-09-06: qty 2

## 2017-09-06 MED ORDER — FUROSEMIDE 10 MG/ML IJ SOLN
60.0000 mg | Freq: Two times a day (BID) | INTRAMUSCULAR | Status: DC
  Administered 2017-09-06 – 2017-09-07 (×2): 60 mg via INTRAVENOUS
  Filled 2017-09-06 (×2): qty 6

## 2017-09-06 NOTE — Progress Notes (Signed)
ANTICOAGULATION CONSULT NOTE   Pharmacy Consult for warfarin  Indication: atrial fibrillation    Patient Measurements: Height: 6' (182.9 cm) Weight: 203 lb 14.8 oz (92.5 kg) IBW/kg (Calculated) : 77.6 Heparin Dosing Weight: n/a  Vital Signs: Temp: 99.7 F (37.6 C) (05/26 1125) Temp Source: Oral (05/26 1125) BP: 154/85 (05/26 1000) Pulse Rate: 48 (05/26 1125)  Labs: Recent Labs    09/04/17 0648 09/04/17 1113 09/04/17 1729 09/04/17 2251 09/05/17 0408 09/06/17 0520  HGB 12.2*  --   --   --  11.2* 11.5*  HCT 38.0*  --   --   --  34.8* 35.9*  PLT 207  --   --   --  193 216  LABPROT 21.1*  --   --   --  25.4* 29.5*  INR 1.84  --   --   --  2.34 2.83  CREATININE 0.91  --   --   --  0.84 0.77  TROPONINI <0.03 0.16* 0.13* 0.10*  --   --     Estimated Creatinine Clearance: 79.5 mL/min (by C-G formula based on SCr of 0.77 mg/dL).     Assessment: INR bumped by ~ 0.5 with yesterday's dose of 2.5mg  (likely d/t CHF decomp)     Goal of Therapy:  INR 2-3 Monitor platelets by anticoagulation protocol: Yes   Plan: give warfarin 1mg  today  Monitor CBC, INR and patient progress   Despina Pole 09/06/2017,12:59 PM

## 2017-09-06 NOTE — Progress Notes (Signed)
PROGRESS NOTE    Michael Stephens  YQM:[id number] DOB: 06/30/1934 DOA: 09/04/2017 PCP: [facility]    Brief Narrative:  83 year old male admitted to the hospital with shortness of breath and found to be in decompensated CHF.  His rapid atrial flutter and was initially placed on Cardizem infusion, but this is been weaned off.  He is currently receiving intravenous diuresis and management of his heart rate with metoprolol and diltiazem.   Assessment & Plan:   Active Problems:   Atrial flutter (HCC)   Essential hypertension   Acute respiratory failure with hypoxia (HCC)   CHF exacerbation (HCC)   Acute on chronic diastolic CHF (congestive heart failure) ([place])   1. Acute respiratory failure with hypoxia.  Due to acute decompensated CHF. Wean off oxygen as tolerated. 2. Acute on chronic diastolic congestive heart failure.  Related to noncompliance with sodium intake as well as fluid intake.  Continues to have evidence of volume overload.  Net volume status is -2.4 L.  Continue on intravenous Lasix.  Continue on beta-blockers.  Started on low-dose losartan. 3. Atrial flutter.  Currently on metoprolol and diltiazem.  Heart rate is mildly elevated.  Oral Cardizem has also been ordered.  He is anticoagulated with Coumadin.  Continue to follow heart rates. 4. Hypertension.  Blood pressure stable.  Continue on metoprolol, losartan, lasix  DVT prophylaxis: Warfarin Code Status: Full code Family Communication: Discussed with wife at the bedside Disposition Plan: Discharge home once adequately diuresed, possibly, 5/27   Subjective: Eating and drinking well.  No complaints.  No CP.   Objective: Vitals:   09/06/17 0700 09/06/17 0741 09/06/17 0800 09/06/17 0900  BP: (!) 132/100  (!) 164/118 (!) 181/165  Pulse: (!) 35 (!) 101 (!) 113 (!) 33  Resp: (!) 28 16 (!) 25 (!) 32  Temp:  98.6 F (37 C)    TempSrc:  Axillary    SpO2: 93% 100% 96% 100%  Weight:      Height:         Intake/Output Summary (Last 24 hours) at 09/06/2017 0958 Last data filed at 09/06/2017 0900 Gross per 24 hour  Intake 1080 ml  Output 2175 ml  Net -1095 ml   Filed Weights   09/04/17 0856 09/05/17 0400 09/06/17 0500  Weight: 94.5 kg (208 lb 5.4 oz) 92.7 kg (204 lb 5.9 oz) 92.5 kg (203 lb 14.8 oz)    Examination:  General exam: Appears calm and comfortable  Respiratory system: Crackles at bases. Respiratory effort normal. Cardiovascular system: S1, S2, irregular no JVD, murmurs, rubs, gallops or clicks.  Trace pretibial edema. Gastrointestinal system: Abdomen is nondistended, soft and nontender. No organomegaly or masses felt. Normal bowel sounds heard. Central nervous system: Alert and oriented. No focal neurological deficits. Extremities: Symmetric 5 x 5 power. Trace pretibial edema BLEs.  Skin: No rashes, lesions or ulcers Psychiatry: Judgement and insight appear normal. Mood & affect appropriate.   Data Reviewed: I have personally reviewed following labs and imaging studies  CBC: Recent Labs  Lab 09/04/17 0648 09/05/17 0408 09/06/17 0520  WBC 9.1 8.1 7.9  NEUTROABS 6.6  --   --   HGB 12.2* 11.2* 11.5*  HCT 38.0* 34.8* 35.9*  MCV 95.2 95.6 96.2  PLT 207 193 528   Basic Metabolic Panel: Recent Labs  Lab 09/04/17 0648 09/05/17 0408 09/06/17 0520  NA 136 137 138  K 3.8 3.1* 4.1  CL 100* 97* 101  CO2 28 31 29   GLUCOSE 198* 122* 119*  BUN 29* 27* 31*  CREATININE 0.91 0.84 0.77  CALCIUM 9.7 9.0 9.2  MG  --  2.0  --    GFR: Estimated Creatinine Clearance: 79.5 mL/min (by C-G formula based on SCr of 0.77 mg/dL). Liver Function Tests: No results for input(s): AST, ALT, ALKPHOS, BILITOT, PROT, ALBUMIN in the last 168 hours. No results for input(s): LIPASE, AMYLASE in the last 168 hours. No results for input(s): AMMONIA in the last 168 hours. Coagulation Profile: Recent Labs  Lab 09/03/17 1036 09/04/17 0648 09/05/17 0408 09/06/17 0520  INR 1.8* 1.84 2.34  2.83   Cardiac Enzymes: Recent Labs  Lab 09/04/17 0648 09/04/17 1113 09/04/17 1729 09/04/17 2251  TROPONINI <0.03 0.16* 0.13* 0.10*   BNP (last 3 results) No results for input(s): PROBNP in the last 8760 hours. HbA1C: No results for input(s): HGBA1C in the last 72 hours. CBG: No results for input(s): GLUCAP in the last 168 hours. Lipid Profile: No results for input(s): CHOL, HDL, LDLCALC, TRIG, CHOLHDL, LDLDIRECT in the last 72 hours. Thyroid Function Tests: No results for input(s): TSH, T4TOTAL, FREET4, T3FREE, THYROIDAB in the last 72 hours. Anemia Panel: No results for input(s): VITAMINB12, FOLATE, FERRITIN, TIBC, IRON, RETICCTPCT in the last 72 hours. Sepsis Labs: No results for input(s): PROCALCITON, LATICACIDVEN in the last 168 hours.  Recent Results (from the past 240 hour(s))  MRSA PCR Screening     Status: None   Collection Time: 09/04/17  9:16 AM  Result Value Ref Range Status   MRSA by PCR NEGATIVE NEGATIVE Final    Comment:        The GeneXpert MRSA Assay (FDA approved for NASAL specimens only), is one component of a comprehensive MRSA colonization surveillance program. It is not intended to diagnose MRSA infection nor to guide or monitor treatment for MRSA infections. Performed at [facility], [address], [place], [place] [address]     Radiology Studies: No results found.  Scheduled Meds: . diltiazem  30 mg Oral Q8H  . furosemide  40 mg Intravenous BID  . losartan  25 mg Oral Daily  . metoprolol tartrate  100 mg Oral BID  . polyethylene glycol  17 g Oral Daily  . pravastatin  20 mg Oral QPM  . sodium chloride flush  3 mL Intravenous Q12H  . Warfarin - Pharmacist Dosing Inpatient   Does not apply Daily   Continuous Infusions: . sodium chloride       LOS: 2 days   Time spent: 30 minutes  Irwin Brakeman, MD [facility] Pager [phone]  If 7PM-7AM, please contact night-coverage [email or web address] Password [facility] 09/06/2017, 9:58  AM

## 2017-09-06 NOTE — Progress Notes (Signed)
Patients heart rate this morning was as high as 150 beats per minute until he was given his scheduled metoprolol this morning and his rate decreased to 97 beats per minute.

## 2017-09-07 ENCOUNTER — Inpatient Hospital Stay (HOSPITAL_COMMUNITY): Payer: Medicare Other

## 2017-09-07 DIAGNOSIS — K5900 Constipation, unspecified: Secondary | ICD-10-CM | POA: Diagnosis present

## 2017-09-07 DIAGNOSIS — R339 Retention of urine, unspecified: Secondary | ICD-10-CM | POA: Diagnosis present

## 2017-09-07 LAB — CBC
HCT: 36.5 % — ABNORMAL LOW (ref 39.0–52.0)
Hemoglobin: 11.9 g/dL — ABNORMAL LOW (ref 13.0–17.0)
MCH: 31.2 pg (ref 26.0–34.0)
MCHC: 32.6 g/dL (ref 30.0–36.0)
MCV: 95.8 fL (ref 78.0–100.0)
PLATELETS: 238 10*3/uL (ref 150–400)
RBC: 3.81 MIL/uL — AB (ref 4.22–5.81)
RDW: 14.9 % (ref 11.5–15.5)
WBC: 6.7 10*3/uL (ref 4.0–10.5)

## 2017-09-07 LAB — BASIC METABOLIC PANEL
Anion gap: 6 (ref 5–15)
BUN: 33 mg/dL — ABNORMAL HIGH (ref 6–20)
CALCIUM: 9.2 mg/dL (ref 8.9–10.3)
CHLORIDE: 99 mmol/L — AB (ref 101–111)
CO2: 31 mmol/L (ref 22–32)
CREATININE: 0.8 mg/dL (ref 0.61–1.24)
GFR calc Af Amer: >60 mL/min (ref >60)
GFR calc non Af Amer: >60 mL/min (ref >60)
Glucose, Bld: 98 mg/dL (ref 65–99)
POTASSIUM: 3.8 mmol/L (ref 3.5–5.1)
SODIUM: 136 mmol/L (ref 135–145)

## 2017-09-07 LAB — PROTIME-INR
INR: 2.37
Prothrombin Time: 25.7 seconds — ABNORMAL HIGH (ref 11.4–15.2)

## 2017-09-07 MED ORDER — SENNOSIDES-DOCUSATE SODIUM 8.6-50 MG PO TABS
2.0000 | ORAL_TABLET | Freq: Every day | ORAL | Status: DC
  Administered 2017-09-07 – 2017-09-08 (×2): 2 via ORAL
  Filled 2017-09-07 (×2): qty 2

## 2017-09-07 MED ORDER — WARFARIN SODIUM 5 MG PO TABS
5.0000 mg | ORAL_TABLET | Freq: Once | ORAL | Status: AC
  Administered 2017-09-07: 5 mg via ORAL
  Filled 2017-09-07: qty 1

## 2017-09-07 MED ORDER — FUROSEMIDE 10 MG/ML IJ SOLN
60.0000 mg | Freq: Two times a day (BID) | INTRAMUSCULAR | Status: AC
  Administered 2017-09-07 – 2017-09-08 (×2): 60 mg via INTRAVENOUS
  Filled 2017-09-07 (×2): qty 6

## 2017-09-07 MED ORDER — DILTIAZEM HCL ER COATED BEADS 120 MG PO CP24
120.0000 mg | ORAL_CAPSULE | Freq: Every day | ORAL | Status: DC
  Administered 2017-09-08: 120 mg via ORAL
  Filled 2017-09-07: qty 1

## 2017-09-07 MED ORDER — FUROSEMIDE 10 MG/ML IJ SOLN: 80.0000 mg | Freq: Two times a day (BID) | INTRAMUSCULAR | Status: DC

## 2017-09-07 MED ORDER — POLYETHYLENE GLYCOL 3350 17 G PO PACK
17.0000 g | PACK | Freq: Two times a day (BID) | ORAL | Status: DC
  Administered 2017-09-07 – 2017-09-08 (×3): 17 g via ORAL
  Filled 2017-09-07 (×3): qty 1

## 2017-09-07 MED ORDER — TAMSULOSIN HCL 0.4 MG PO CAPS
0.4000 mg | ORAL_CAPSULE | Freq: Every day | ORAL | Status: DC
  Administered 2017-09-07 – 2017-09-08 (×2): 0.4 mg via ORAL
  Filled 2017-09-07 (×2): qty 1

## 2017-09-07 MED ORDER — DILTIAZEM HCL ER COATED BEADS 120 MG PO CP24: 120.0000 mg | ORAL_CAPSULE | Freq: Every day | ORAL | Status: DC

## 2017-09-07 MED ORDER — PANTOPRAZOLE SODIUM 40 MG PO TBEC
40.0000 mg | DELAYED_RELEASE_TABLET | Freq: Every day | ORAL | Status: DC
  Administered 2017-09-07 – 2017-09-08 (×2): 40 mg via ORAL
  Filled 2017-09-07 (×2): qty 1

## 2017-09-07 NOTE — Care Management Important Message (Signed)
Important Message  Patient Details  Name: Govind Furey MRN: [id number] Date of Birth: 1934-12-09   Medicare Important Message Given:  Yes    Shelda Altes 09/07/2017, 11:13 AM

## 2017-09-07 NOTE — Evaluation (Signed)
Physical Therapy Evaluation Patient Details Name: Michael Stephens MRN: [id number] DOB: 01-31-1935 Today's Date: 09/07/2017   History of Present Illness  Michael Stephens is a 83 y.o. male with medical history significant of diastolic heart failure, hypertension, atrial flutter on anticoagulation, presents to the hospital with complaint shortness of breath.  Patient reports he had progressive shortness of breath for the past few months.  He recently  saw his cardiologist where Lasix dose was increased.  Overnight, his shortness of breath got substantially worse.  Denies any chest pain.  He reports compliance with medications.  He is also noticed worsening lower extremity edema.  On further questioning, he admits to noncompliance with sodium intake as well as fluid intake.  He was also having significant indigestion last night.  He drank 2 cups of coffee and subsequently vomited.  He has not had any diarrhea, in fact stays more constipated.    Clinical Impression  [name] is a 83 y.o. presenting for PT evaluation with recent decrease in functional mobility secondary to gradually increasing SOB over last several months. He is currently functioning below his baseline of independent with no device, and now requires supervision for transfers/gait with no device. He is slightly unsteady with gait during turns and has difficulty with SLS challenges. He needs to be able to ascend/descend 2 steps to enter his house and will benefit from skilled PT to address current impairments improve mobility in preparation for safe discharge home. Acute PT will follow.     Follow Up Recommendations No PT follow up    Equipment Recommendations  None recommended by PT    Recommendations for Other Services       Precautions / Restrictions Precautions Precautions: Fall Restrictions Weight Bearing Restrictions: No      Mobility  Bed Mobility Overal bed mobility: Modified Independent         Transfers Overall  transfer level: Needs assistance   Transfers: Sit to/from Stand Sit to Stand: Supervision       Ambulation/Gait Ambulation/Gait assistance: Supervision;Min guard Ambulation Distance (Feet): 40 Feet       Gait velocity interpretation: <1.8 ft/sec, indicate of risk for recurrent falls General Gait Details: unsteady through turns in hallway with no device      Balance Overall balance assessment: Needs assistance Sitting-balance support: No upper extremity supported;Feet supported Sitting balance-Leahy Scale: Normal     Standing balance support: No upper extremity supported;During functional activity Standing balance-Leahy Scale: Good   Single Leg Stance - Right Leg: 2 Single Leg Stance - Left Leg: 2         Pertinent Vitals/Pain Pain Assessment: No/denies pain    Home Living Family/patient expects to be discharged to:: Private residence Living Arrangements: Spouse/significant other Available Help at Discharge: Family Type of Home: House Home Access: Stairs to enter Entrance Stairs-Rails: Psychiatric nurse of Steps: 2 in the front, 7-8 steps in the back, both have bilateral siderails Home Layout: One level Home Equipment: Cane - single point      Prior Function Level of Independence: Independent           Extremity/Trunk Assessment   Upper Extremity Assessment Upper Extremity Assessment: Overall WFL for tasks assessed    Lower Extremity Assessment Lower Extremity Assessment: Overall WFL for tasks assessed       Communication   Communication: No difficulties  Cognition Arousal/Alertness: Awake/alert Behavior During Therapy: WFL for tasks assessed/performed Overall Cognitive Status: Within Functional Limits for tasks assessed  Assessment/Plan    PT Assessment Patient needs continued PT services  PT Problem List Decreased strength;Decreased balance;Decreased activity tolerance;Decreased mobility       PT Treatment  Interventions Gait training;Stair training;Functional mobility training;Therapeutic activities;Therapeutic exercise;Balance training;Patient/family education    PT Goals (Current goals can be found in the Care Plan section)  Acute Rehab PT Goals Patient Stated Goal: return home with wife and get back to normal routine of walking daily for exercise PT Goal Formulation: With patient Time For Goal Achievement: 09/14/17 Potential to Achieve Goals: Good    Frequency Min 3X/week    AM-PAC PT "6 Clicks" Daily Activity  Outcome Measure Difficulty turning over in bed (including adjusting bedclothes, sheets and blankets)?: None Difficulty moving from lying on back to sitting on the side of the bed? : None Difficulty sitting down on and standing up from a chair with arms (e.g., wheelchair, bedside commode, etc,.)?: None Help needed moving to and from a bed to chair (including a wheelchair)?: A Little Help needed walking in hospital room?: A Little Help needed climbing 3-5 steps with a railing? : A Little 6 Click Score: 21    End of Session Equipment Utilized During Treatment: Gait belt Activity Tolerance: Patient tolerated treatment well Patient left: in bed;with family/visitor present;with call bell/phone within reach Nurse Communication: Mobility status PT Visit Diagnosis: Unsteadiness on feet (R26.81);Other abnormalities of gait and mobility (R26.89);Difficulty in walking, not elsewhere classified (R26.2)    Time: 0300-9233 PT Time Calculation (min) (ACUTE ONLY): 14 min   Charges:   PT Evaluation $PT Eval Low Complexity: 1 Low     Kipp Brood, PT, DPT Physical Therapist with [facility]  09/07/2017 12:56 PM

## 2017-09-07 NOTE — Plan of Care (Signed)
  Problem: Acute Rehab PT Goals(only PT should resolve) Goal: Patient Will Transfer Sit To/From Stand Outcome: Progressing Flowsheets (Taken 09/07/2017 1302) Patient will transfer sit to/from stand: Independently Goal: Pt Will Transfer Bed To Chair/Chair To Bed Outcome: Progressing Flowsheets (Taken 09/07/2017 1302) Pt will Transfer Bed to Chair/Chair to Bed: Independently Goal: Pt Will Ambulate Outcome: Progressing Flowsheets (Taken 09/07/2017 1302) Pt will Ambulate: 100 feet;with modified independence Goal: Pt Will Go Up/Down Stairs Outcome: Progressing Flowsheets (Taken 09/07/2017 1302) Pt will Go Up / Down Stairs: 3-5 stairs;with modified independence

## 2017-09-07 NOTE — Progress Notes (Signed)
ANTICOAGULATION CONSULT NOTE   Pharmacy Consult for warfarin  Indication: atrial fibrillation  Patient Measurements: Height: 6' (182.9 cm) Weight: 201 lb 4.8 oz (91.3 kg) IBW/kg (Calculated) : 77.6 Heparin Dosing Weight: n/a  Vital Signs: Temp: 97.5 F (36.4 C) (05/27 0619) Temp Source: Oral (05/27 0619) BP: 132/85 (05/27 0619) Pulse Rate: 86 (05/27 0619)  Labs: Recent Labs    09/04/17 1113 09/04/17 1729 09/04/17 2251  09/05/17 0408 09/06/17 0520 09/07/17 0640  HGB  --   --   --    < > 11.2* 11.5* 11.9*  HCT  --   --   --   --  34.8* 35.9* 36.5*  PLT  --   --   --   --  193 216 238  LABPROT  --   --   --   --  25.4* 29.5* 25.7*  INR  --   --   --   --  2.34 2.83 2.37  CREATININE  --   --   --   --  0.84 0.77 0.80  TROPONINI 0.16* 0.13* 0.10*  --   --   --   --    < > = values in this interval not displayed.    Estimated Creatinine Clearance: 79.5 mL/min (by C-G formula based on SCr of 0.8 mg/dL).   Assessment: INR at goal, no bleeding noted.   Goal of Therapy:  INR 2-3 Monitor platelets by anticoagulation protocol: Yes   Plan:  Warfarin 5mg  today  Monitor CBC, INR and patient progress Monitor for signs and symptoms of bleeding.   Biagio Quint R 09/07/2017,10:11 AM

## 2017-09-07 NOTE — Progress Notes (Signed)
PROGRESS NOTE    Michael Stephens  QPY:[id number] DOB: 10/23/1934 DOA: 09/04/2017 PCP: [facility]    Brief Narrative:  83 year old male admitted to the hospital with shortness of breath and found to be in decompensated CHF.  His rapid atrial flutter and was initially placed on Cardizem infusion, but this is been weaned off.  He is currently receiving intravenous diuresis and management of his heart rate with metoprolol and diltiazem.   Assessment & Plan:   Active Problems:   Atrial flutter (HCC)   Essential hypertension   Acute respiratory failure with hypoxia (HCC)   CHF exacerbation (HCC)   Acute on chronic diastolic CHF (congestive heart failure) ([place])   1. Acute respiratory failure with hypoxia.  Due to acute decompensated CHF. Wean off oxygen as tolerated. Clinically he appears to be improving.  2. Acute on chronic diastolic congestive heart failure.  Related to noncompliance with sodium intake as well as fluid intake.  Continues to have evidence of volume overload.  Net volume status is -2.4 L.  Continue on intravenous Lasix.  Continue on beta-blockers.  Started on low-dose losartan. Chest xray demonstrates improvement in pulmonary edema but not completely resolved.  Repeat study in AM.   3. Chronic constipation - add more stool softeners and follow.  Check Abd xray.  Check bladder scan.  4. Atrial flutter.  Currently on metoprolol and diltiazem.  Heart rate is mildly elevated.  Oral Cardizem has also been ordered.  He is anticoagulated with Coumadin.  Continue to follow heart rates. 5. Hypertension.  Blood pressure stable.  Continue on metoprolol, losartan, lasix 6. GERD - added protonix daily.    DVT prophylaxis: Warfarin Code Status: Full code Family Communication: Discussed with wife at the bedside Disposition Plan: Discharge home once adequately diuresed, possibly, 5/27   Subjective: Pt complains of not feeling well. He has constipation, abdominal bloating  and acid reflux symptoms.    Objective: Vitals:   09/06/17 1522 09/06/17 2019 09/07/17 0500 09/07/17 0619  BP: 122/87 (!) 128/95  132/85  Pulse: 97 80  86  Resp: 18 19  20   Temp: 98.4 F (36.9 C) 98.8 F (37.1 C)  (!) 97.5 F (36.4 C)  TempSrc: Oral Oral  Oral  SpO2: 91% 93%  92%  Weight:   91.3 kg (201 lb 4.8 oz)   Height:        Intake/Output Summary (Last 24 hours) at 09/07/2017 0908 Last data filed at 09/06/2017 1800 Gross per 24 hour  Intake 600 ml  Output 515 ml  Net 85 ml   Filed Weights   09/05/17 0400 09/06/17 0500 09/07/17 0500  Weight: 92.7 kg (204 lb 5.9 oz) 92.5 kg (203 lb 14.8 oz) 91.3 kg (201 lb 4.8 oz)    Examination:  General exam: Appears calm and comfortable  Respiratory system: improved air sounds, rare crackles heard. Respiratory effort normal. Cardiovascular system: S1, S2, irregular no JVD, murmurs, rubs, gallops or clicks.  Trace pretibial edema. Gastrointestinal system: Abdomen is nondistended, soft and nontender. No organomegaly or masses felt. Normal bowel sounds heard. Central nervous system: Alert and oriented. No focal neurological deficits. Extremities: Symmetric 5 x 5 power. Trace pretibial edema BLEs.  Skin: No rashes, lesions or ulcers Psychiatry: Judgement and insight appear normal. Mood & affect appropriate.   Data Reviewed: I have personally reviewed following labs and imaging studies  CBC: Recent Labs  Lab 09/04/17 0648 09/05/17 0408 09/06/17 0520 09/07/17 0640  WBC 9.1 8.1 7.9 6.7  NEUTROABS 6.6  --   --   --  HGB 12.2* 11.2* 11.5* 11.9*  HCT 38.0* 34.8* 35.9* 36.5*  MCV 95.2 95.6 96.2 95.8  PLT 207 193 216 235   Basic Metabolic Panel: Recent Labs  Lab 09/04/17 0648 09/05/17 0408 09/06/17 0520 09/07/17 0640  NA 136 137 138 136  K 3.8 3.1* 4.1 3.8  CL 100* 97* 101 99*  CO2 28 31 29 31   GLUCOSE 198* 122* 119* 98  BUN 29* 27* 31* 33*  CREATININE 0.91 0.84 0.77 0.80  CALCIUM 9.7 9.0 9.2 9.2  MG  --  2.0  --   --     GFR: Estimated Creatinine Clearance: 79.5 mL/min (by C-G formula based on SCr of 0.8 mg/dL). Liver Function Tests: No results for input(s): AST, ALT, ALKPHOS, BILITOT, PROT, ALBUMIN in the last 168 hours. No results for input(s): LIPASE, AMYLASE in the last 168 hours. No results for input(s): AMMONIA in the last 168 hours. Coagulation Profile: Recent Labs  Lab 09/03/17 1036 09/04/17 0648 09/05/17 0408 09/06/17 0520 09/07/17 0640  INR 1.8* 1.84 2.34 2.83 2.37   Cardiac Enzymes: Recent Labs  Lab 09/04/17 0648 09/04/17 1113 09/04/17 1729 09/04/17 2251  TROPONINI <0.03 0.16* 0.13* 0.10*   BNP (last 3 results) No results for input(s): PROBNP in the last 8760 hours. HbA1C: No results for input(s): HGBA1C in the last 72 hours. CBG: No results for input(s): GLUCAP in the last 168 hours. Lipid Profile: No results for input(s): CHOL, HDL, LDLCALC, TRIG, CHOLHDL, LDLDIRECT in the last 72 hours. Thyroid Function Tests: No results for input(s): TSH, T4TOTAL, FREET4, T3FREE, THYROIDAB in the last 72 hours. Anemia Panel: No results for input(s): VITAMINB12, FOLATE, FERRITIN, TIBC, IRON, RETICCTPCT in the last 72 hours. Sepsis Labs: No results for input(s): PROCALCITON, LATICACIDVEN in the last 168 hours.  Recent Results (from the past 240 hour(s))  MRSA PCR Screening     Status: None   Collection Time: 09/04/17  9:16 AM  Result Value Ref Range Status   MRSA by PCR NEGATIVE NEGATIVE Final    Comment:        The GeneXpert MRSA Assay (FDA approved for NASAL specimens only), is one component of a comprehensive MRSA colonization surveillance program. It is not intended to diagnose MRSA infection nor to guide or monitor treatment for MRSA infections. Performed at [facility], [address], [place], [place] [address]     Radiology Studies: Dg Chest [facility] 1 View  Result Date: 09/07/2017 CLINICAL DATA:  Follow-up CHF. EXAM: PORTABLE CHEST 1 VIEW COMPARISON:  09/04/2017 and  05/08/2017. FINDINGS: Vascular congestion and interstitial thickening has improved, but not resolved. No new lung opacities. No convincing pleural effusion.  No pneumothorax. Cardiac silhouette is normal in size. IMPRESSION: 1. Improved interstitial pulmonary edema.  No new abnormalities. Electronically Signed   By: Lajean Manes M.D.   On: 09/07/2017 07:40   Scheduled Meds: . diltiazem  30 mg Oral Q8H  . furosemide  60 mg Intravenous BID  . losartan  25 mg Oral Daily  . metoprolol tartrate  100 mg Oral BID  . pantoprazole  40 mg Oral Q0600  . polyethylene glycol  17 g Oral BID  . pravastatin  20 mg Oral QPM  . senna-docusate  2 tablet Oral Daily  . sodium chloride flush  3 mL Intravenous Q12H  . Warfarin - Pharmacist Dosing Inpatient   Does not apply Daily   Continuous Infusions: . sodium chloride      LOS: 3 days   Time spent: 30 minutes  Irwin Brakeman, MD [facility] Pager [phone]  If 7PM-7AM, please contact night-coverage [email or web address] Password [email or web address] 09/07/2017, 9:07 AM

## 2017-09-07 NOTE — Progress Notes (Signed)
OT Screen Note  Patient Details Name: Zebulin Siegel MRN: [id number] DOB: 10/23/1934   Cancelled Treatment:    Reason Eval/Treat Not Completed: OT screened, no needs identified, will sign off    Ailene Ravel, OTR/L,CBIS  [phone]   09/07/2017, 10:45 AM

## 2017-09-07 NOTE — Progress Notes (Signed)
09/07/2017 9:43 AM   Pt's post void bladder scan had more than 800 cc urine present in bladder.  Will place foley and start flomax daily. He will need outpatient urology follow up after discharge.  He may need to discharge home with foley.    Murvin Natal MD

## 2017-09-08 ENCOUNTER — Inpatient Hospital Stay (HOSPITAL_COMMUNITY): Payer: Medicare Other

## 2017-09-08 DIAGNOSIS — K5900 Constipation, unspecified: Secondary | ICD-10-CM

## 2017-09-08 DIAGNOSIS — R339 Retention of urine, unspecified: Secondary | ICD-10-CM

## 2017-09-08 LAB — CBC
HCT: 39.2 % (ref 39.0–52.0)
Hemoglobin: 12.9 g/dL — ABNORMAL LOW (ref 13.0–17.0)
MCH: 30.8 pg (ref 26.0–34.0)
MCHC: 32.9 g/dL (ref 30.0–36.0)
MCV: 93.6 fL (ref 78.0–100.0)
PLATELETS: 246 10*3/uL (ref 150–400)
RBC: 4.19 MIL/uL — ABNORMAL LOW (ref 4.22–5.81)
RDW: 14.6 % (ref 11.5–15.5)
WBC: 9.6 10*3/uL (ref 4.0–10.5)

## 2017-09-08 LAB — BASIC METABOLIC PANEL
Anion gap: 10 (ref 5–15)
BUN: 27 mg/dL — AB (ref 6–20)
CHLORIDE: 96 mmol/L — AB (ref 101–111)
CO2: 32 mmol/L (ref 22–32)
CREATININE: 0.85 mg/dL (ref 0.61–1.24)
Calcium: 9.4 mg/dL (ref 8.9–10.3)
GFR calc Af Amer: >60 mL/min (ref >60)
GFR calc non Af Amer: >60 mL/min (ref >60)
GLUCOSE: 112 mg/dL — AB (ref 65–99)
Potassium: 3.4 mmol/L — ABNORMAL LOW (ref 3.5–5.1)
Sodium: 138 mmol/L (ref 135–145)

## 2017-09-08 LAB — PROTIME-INR
INR: 2.29
Prothrombin Time: 25 seconds — ABNORMAL HIGH (ref 11.4–15.2)

## 2017-09-08 LAB — MAGNESIUM: Magnesium: 2 mg/dL (ref 1.7–2.4)

## 2017-09-08 MED ORDER — TAMSULOSIN HCL 0.4 MG PO CAPS: 0.4000 mg | ORAL_CAPSULE | Freq: Every day | ORAL | 0 refills | Status: AC

## 2017-09-08 MED ORDER — LOSARTAN POTASSIUM 25 MG PO TABS: 25.0000 mg | ORAL_TABLET | Freq: Every day | ORAL | 0 refills | Status: DC

## 2017-09-08 MED ORDER — POTASSIUM CHLORIDE CRYS ER 10 MEQ PO TBCR
50.0000 meq | EXTENDED_RELEASE_TABLET | Freq: Every day | ORAL | Status: DC
  Administered 2017-09-08: 50 meq via ORAL
  Filled 2017-09-08: qty 1

## 2017-09-08 MED ORDER — DILTIAZEM HCL ER COATED BEADS 120 MG PO CP24: 120.0000 mg | ORAL_CAPSULE | Freq: Every day | ORAL | 0 refills | Status: DC

## 2017-09-08 MED ORDER — WARFARIN SODIUM 5 MG PO TABS: 5.0000 mg | ORAL_TABLET | Freq: Once | ORAL | Status: DC

## 2017-09-08 MED ORDER — POTASSIUM CHLORIDE CRYS ER 20 MEQ PO TBCR: 20.0000 meq | EXTENDED_RELEASE_TABLET | Freq: Every day | ORAL | 0 refills | Status: DC

## 2017-09-08 MED ORDER — POLYETHYLENE GLYCOL 3350 17 G PO PACK: 17.0000 g | PACK | Freq: Every day | ORAL | 0 refills | Status: DC

## 2017-09-08 MED ORDER — FUROSEMIDE 40 MG PO TABS: 40.0000 mg | ORAL_TABLET | Freq: Every day | ORAL | Status: DC

## 2017-09-08 NOTE — Discharge Instructions (Signed)
PLEASE CALL AND MAKE APPT TO SEE UROLOGIST OFFICE IN 7-10 DAYS TO EVALUATE FOR REMOVING FOLEY CATHETER.  PLEASE LEAVE CATHETER IN PLACE UNTIL YOU SEE THE UROLOGIST.  PLEASE FOLLOW UP WITH YOUR CARDIOLOGIST IN 2 WEEKS.      Follow with Primary MD  Practice, [facility]  and other consultant's as instructed your Hospitalist MD  Please get a complete blood count and chemistry panel checked by your Primary MD at your next visit, and again as instructed by your Primary MD.  Get Medicines reviewed and adjusted: Please take all your medications with you for your next visit with your Primary MD  Laboratory/radiological data: Please request your Primary MD to go over all hospital tests and procedure/radiological results at the follow up, please ask your Primary MD to get all Hospital records sent to his/her office.  In some cases, they will be blood work, cultures and biopsy results pending at the time of your discharge. Please request that your primary care M.D. follows up on these results.  Also Note the following: If you experience worsening of your admission symptoms, develop shortness of breath, life threatening emergency, suicidal or homicidal thoughts you must seek medical attention immediately by calling 911 or calling your MD immediately  if symptoms less severe.  You must read complete instructions/literature along with all the possible adverse reactions/side effects for all the Medicines you take and that have been prescribed to you. Take any new Medicines after you have completely understood and accpet all the possible adverse reactions/side effects.   Do not drive when taking Pain medications or sleeping medications (Benzodaizepines)  Do not take more than prescribed Pain, Sleep and Anxiety Medications. It is not advisable to combine anxiety,sleep and pain medications without talking with your primary care practitioner  Special Instructions: If you have smoked or chewed Tobacco   in the last 2 yrs please stop smoking, stop any regular Alcohol  and or any Recreational drug use.  Wear Seat belts while driving.  Please note: You were cared for by a hospitalist during your hospital stay. Once you are discharged, your primary care physician will handle any further medical issues. Please note that NO REFILLS for any discharge medications will be authorized once you are discharged, as it is imperative that you return to your primary care physician (or establish a relationship with a primary care physician if you do not have one) for your post hospital discharge needs so that they can reassess your need for medications and monitor your lab values.     Indwelling Urinary Catheter Care, Adult Take good care of your catheter to keep it working and to prevent problems. How to wear your catheter Attach your catheter to your leg with tape (adhesive tape) or a leg strap. Make sure it is not too tight. If you use tape, remove any bits of tape that are already on the catheter. How to wear a drainage bag You should have:  A large overnight bag.  A small leg bag.  Overnight Bag You may wear the overnight bag at any time. Always keep the bag below the level of your bladder but off the floor. When you sleep, put a clean plastic bag in a wastebasket. Then hang the bag inside the wastebasket. Leg Bag Never wear the leg bag at night. Always wear the leg bag below your knee. Keep the leg bag secure with a leg strap or tape. How to care for your skin  Clean the skin around the  catheter at least once every day.  Shower every day. Do not take baths.  Put creams, lotions, or ointments on your genital area only as told by your doctor.  Do not use powders, sprays, or lotions on your genital area. How to clean your catheter and your skin 1. Wash your hands with soap and water. 2. Wet a washcloth in warm water and gentle (mild) soap. 3. Use the washcloth to clean the skin where the  catheter enters your body. Clean downward and wipe away from the catheter in small circles. Do not wipe toward the catheter. 4. Pat the area dry with a clean towel. Make sure to clean off all soap. How to care for your drainage bags Empty your drainage bag when it is ?- full or at least 2-3 times a day. Replace your drainage bag once a month or sooner if it starts to smell bad or look dirty. Do not clean your drainage bag unless told by your doctor. Emptying a drainage bag  Supplies Needed  Rubbing alcohol.  Gauze pad or cotton ball.  Tape or a leg strap.  Steps 1. Wash your hands with soap and water. 2. Separate (detach) the bag from your leg. 3. Hold the bag over the toilet or a clean container. Keep the bag below your hips and bladder. This stops pee (urine) from going back into the tube. 4. Open the pour spout at the bottom of the bag. 5. Empty the pee into the toilet or container. Do not let the pour spout touch any surface. 6. Put rubbing alcohol on a gauze pad or cotton ball. 7. Use the gauze pad or cotton ball to clean the pour spout. 8. Close the pour spout. 9. Attach the bag to your leg with tape or a leg strap. 10. Wash your hands.  Changing a drainage bag Supplies Needed  Alcohol wipes.  A clean drainage bag.  Adhesive tape or a leg strap.  Steps 1. Wash your hands with soap and water. 2. Separate the dirty bag from your leg. 3. Pinch the rubber catheter with your fingers so that pee does not spill out. 4. Separate the catheter tube from the drainage tube where these tubes connect (at the connection valve). Do not let the tubes touch any surface. 5. Clean the end of the catheter tube with an alcohol wipe. Use a different alcohol wipe to clean the end of the drainage tube. 6. Connect the catheter tube to the drainage tube of the clean bag. 7. Attach the new bag to the leg with adhesive tape or a leg strap. 8. Wash your hands.  How to prevent infection and  other problems  Never pull on your catheter or try to remove it. Pulling can damage tissue in your body.  Always wash your hands before and after touching your catheter.  If a leg strap gets wet, replace it with a dry one.  Drink enough fluids to keep your pee clear or pale yellow, or as told by your doctor.  Do not let the drainage bag or tubing touch the floor.  Wear cotton underwear.  If you are male, wipe from front to back after you poop (have a bowel movement).  Check on the catheter often to make sure it works and the tubing is not twisted. Get help if:  Your pee is cloudy.  Your pee smells unusually bad.  Your pee is not draining into the bag.  Your tube gets clogged.  Your catheter  starts to leak.  Your bladder feels full. Get help right away if:  You have redness, swelling, or pain where the catheter enters your body.  You have fluid, pus, or a bad smell coming from the area where the catheter enters your body.  The area where the catheter enters your body feels warm.  You have a fever.  You have pain in your: ? Stomach (abdomen). ? Legs. ? Lower back. ? Bladder.  You see blood fill the catheter.  Your pee is pink or red.  You feel sick to your stomach (nauseous).  You throw up (vomit).  You have chills.  Your catheter gets pulled out. This information is not intended to replace advice given to you by your health care provider. Make sure you discuss any questions you have with your health care provider. Document Released: 07/26/2012 Document Revised: 02/27/2016 Document Reviewed: 09/13/2013 Elsevier Interactive Patient Education  2018 [employer].     Atrial Fibrillation Atrial fibrillation is a type of heartbeat that is irregular or fast (rapid). If you have this condition, your heart keeps quivering in a weird (chaotic) way. This condition can make it so your heart cannot pump blood normally. Having this condition gives a person more  risk for stroke, heart failure, and other heart problems. There are different types of atrial fibrillation. Talk with your doctor to learn about the type that you have. Follow these instructions at home:  Take over-the-counter and prescription medicines only as told by your doctor.  If your doctor prescribed a blood-thinning medicine, take it exactly as told. Taking too much of it can cause bleeding. If you do not take enough of it, you will not have the protection that you need against stroke and other problems.  Do not use any tobacco products. These include cigarettes, chewing tobacco, and e-cigarettes. If you need help quitting, ask your doctor.  If you have apnea (obstructive sleep apnea), manage it as told by your doctor.  Do not drink alcohol.  Do not drink beverages that have caffeine. These include coffee, soda, and tea.  Maintain a healthy weight. Do not use diet pills unless your doctor says they are safe for you. Diet pills may make heart problems worse.  Follow diet instructions as told by your doctor.  Exercise regularly as told by your doctor.  Keep all follow-up visits as told by your doctor. This is important. Contact a doctor if:  You notice a change in the speed, rhythm, or strength of your heartbeat.  You are taking a blood-thinning medicine and you notice more bruising.  You get tired more easily when you move or exercise. Get help right away if:  You have pain in your chest or your belly (abdomen).  You have sweating or weakness.  You feel sick to your stomach (nauseous).  You notice blood in your throw up (vomit), poop (stool), or pee (urine).  You are short of breath.  You suddenly have swollen feet and ankles.  You feel dizzy.  Your suddenly get weak or numb in your face, arms, or legs, especially if it happens on one side of your body.  You have trouble talking, trouble understanding, or both.  Your face or your eyelid droops on one  side. These symptoms may be an emergency. Do not wait to see if the symptoms will go away. Get medical help right away. Call your local emergency services (911 in the [place] Do not drive yourself to the hospital. This information is  not intended to replace advice given to you by your health care provider. Make sure you discuss any questions you have with your health care provider. Document Released: 01/08/2008 Document Revised: 09/06/2015 Document Reviewed: 07/26/2014 Elsevier Interactive Patient Education  2018 [place].     Heart Failure Heart failure means your heart has trouble pumping blood. This makes it hard for your body to work well. Heart failure is usually a long-term (chronic) condition. You must take good care of yourself and follow your doctor's treatment plan. Follow these instructions at home:  Take your heart medicine as told by your doctor. ? Do not stop taking medicine unless your doctor tells you to. ? Do not skip any dose of medicine. ? Refill your medicines before they run out. ? Take other medicines only as told by your doctor or pharmacist.  Stay active if told by your doctor. The elderly and people with severe heart failure should talk with a doctor about physical activity.  Eat heart-healthy foods. Choose foods that are without trans fat and are low in saturated fat, cholesterol, and salt (sodium). This includes fresh or frozen fruits and vegetables, fish, lean meats, fat-free or low-fat dairy foods, whole grains, and high-fiber foods. Lentils and dried peas and beans (legumes) are also good choices.  Limit salt if told by your doctor.  Cook in a healthy way. Roast, grill, broil, bake, poach, steam, or stir-fry foods.  Limit fluids as told by your doctor.  Weigh yourself every morning. Do this after you pee (urinate) and before you eat breakfast. Write down your weight to give to your doctor.  Take your blood pressure and write it down if your doctor  tells you to.  Ask your doctor how to check your pulse. Check your pulse as told.  Lose weight if told by your doctor.  Stop smoking or chewing tobacco. Do not use gum or patches that help you quit without your doctor's approval.  Schedule and go to doctor visits as told.  Nonpregnant women should have no more than 1 drink a day. Men should have no more than 2 drinks a day. Talk to your doctor about drinking alcohol.  Stop illegal drug use.  Stay current with shots (immunizations).  Manage your health conditions as told by your doctor.  Learn to manage your stress.  Rest when you are tired.  If it is really hot outside: ? Avoid intense activities. ? Use air conditioning or fans, or get in a cooler place. ? Avoid caffeine and alcohol. ? Wear loose-fitting, lightweight, and light-colored clothing.  If it is really cold outside: ? Avoid intense activities. ? Layer your clothing. ? Wear mittens or gloves, a hat, and a scarf when going outside. ? Avoid alcohol.  Learn about heart failure and get support as needed.  Get help to maintain or improve your quality of life and your ability to care for yourself as needed. Contact a doctor if:  You gain weight quickly.  You are more short of breath than usual.  You cannot do your normal activities.  You tire easily.  You cough more than normal, especially with activity.  You have any or more puffiness (swelling) in areas such as your hands, feet, ankles, or belly (abdomen).  You cannot sleep because it is hard to breathe.  You feel like your heart is beating fast (palpitations).  You get dizzy or light-headed when you stand up. Get help right away if:  You have trouble breathing.  There is a change in mental status, such as becoming less alert or not being able to focus.  You have chest pain or discomfort.  You faint. This information is not intended to replace advice given to you by your health care provider. Make  sure you discuss any questions you have with your health care provider. Document Released: 01/08/2008 Document Revised: 09/06/2015 Document Reviewed: 05/17/2012 Elsevier Interactive Patient Education  2017 [place].   Heart Failure Action Plan A heart failure action plan helps you understand what to do when you have symptoms of heart failure. Follow the plan that was created by you and your health care provider. Review your plan each time you visit your health care provider. Red zone These signs and symptoms mean you should get medical help right away:  You have trouble breathing when resting.  You have a dry cough that is getting worse.  You have swelling or pain in your legs or abdomen that is getting worse.  You suddenly gain more than 2-3 lb (0.9-1.4 kg) in a day, or more than 5 lb (2.3 kg) in one week. This amount may be more or less depending on your condition.  You have trouble staying awake or you feel confused.  You have chest pain.  You do not have an appetite.  You pass out.  If you experience any of these symptoms:  Call your local emergency services (911 in the [place] right away or seek help at the emergency department of the nearest hospital.  Yellow zone These signs and symptoms mean your condition may be getting worse and you should make some changes:  You have trouble breathing when you are active or you need to sleep with extra pillows.  You have swelling in your legs or abdomen.  You gain 2-3 lb (0.9-1.4 kg) in one day, or 5 lb (2.3 kg) in one week. This amount may be more or less depending on your condition.  You get tired easily.  You have trouble sleeping.  You have a dry cough.  If you experience any of these symptoms:  Contact your health care provider within the next day.  Your health care provider may adjust your medicines.  Green zone These signs mean you are doing well and can continue what you are doing:  You do not have shortness  of breath.  You have very little swelling or no new swelling.  Your weight is stable (no gain or loss).  You have a normal activity level.  You do not have chest pain or any other new symptoms.  Follow these instructions at home:  Take over-the-counter and prescription medicines only as told by your health care provider.  Weigh yourself daily. Your target weight is __________ lb (__________ kg). ? Call your health care provider if you gain more than __________ lb (__________ kg) in a day, or more than __________ lb (__________ kg) in one week.  Eat a heart-healthy diet. Work with a diet and nutrition specialist (dietitian) to create an eating plan that is best for you.  Keep all follow-up visits as told by your health care provider. This is important. Where to find more information:  American Heart Association: www.heart.org Summary  Follow the action plan that was created by you and your health care provider.  Get help right away if you have any symptoms in the Red zone. This information is not intended to replace advice given to you by your health care provider. Make sure you discuss  any questions you have with your health care provider. Document Released: 05/10/2016 Document Revised: 05/10/2016 Document Reviewed: 05/10/2016 Elsevier Interactive Patient Education  2018 [place].   Heart Failure Eating Plan Heart failure, also called congestive heart failure, occurs when your heart does not pump blood well enough to meet your body's needs for oxygen-rich blood. Heart failure is a long-term (chronic) condition. Living with heart failure can be challenging. However, following your health care provider's instructions about a healthy lifestyle and working with a diet and nutrition specialist (dietitian) to choose the right foods may help to improve your symptoms. What are tips for following this plan? General guidelines  Do not eat more than 2,300 mg of salt (sodium) a day.  The amount of sodium that is recommended for you may be lower, depending on your condition.  Maintain a healthy body weight as directed. Ask your health care provider what a healthy weight is for you. ? Check your weight every day. ? Work with your health care provider and dietitian to make a plan that is right for you to lose weight or maintain your current weight.  Limit how much fluid you drink. Ask your health care provider or dietitian how much fluid you can have each day.  Limit or avoid alcohol as told by your health care provider or dietitian. Reading food labels  Check food labels for the amount of sodium per serving. Choose foods that have less than 140 mg (milligrams) of sodium in each serving.  Check food labels for the number of calories per serving. This is important if you need to limit your daily calorie intake to lose weight.  Check food labels for the serving size. If you eat more than one serving, you will be eating more sodium and calories than what is listed on the label.  Look for foods that are labeled as "sodium-free," "very low sodium," or "low sodium." ? Foods labeled as "reduced sodium" or "lightly salted" may still have more sodium than what is recommended for you. Cooking  Avoid adding salt when cooking. Ask your health care provider or dietitian before using salt substitutes.  Season food with salt-free seasonings, spices, or herbs. Check the label of seasoning mixes to make sure they do not contain salt.  Cook with heart-healthy oils, such as olive, canola, soybean, or sunflower oil.  Do not fry foods. Cook foods using low-fat methods, such as baking, boiling, grilling, and broiling.  Limit unhealthy fats when cooking by: ? Removing the skin from poultry, such as chicken. ? Removing all visible fats from meats. ? Skimming the fat off from stews, soups, and gravies before serving them. Meal planning  Limit your intake of: ? Processed, canned, or  pre-packaged foods. ? Foods that are high in trans fat, such as fried foods. ? Sweets, desserts, sugary drinks, and other foods with added sugar. ? Full-fat dairy products, such as whole milk.  Eat a balanced diet that includes: ? 4-5 servings of fruit each day and 4-5 servings of vegetables each day. At each meal, try to fill half of your plate with fruits and vegetables. ? Up to 6-8 servings of whole grains each day. ? Up to 2 servings of lean meat, poultry, or fish each day. One serving of meat is equal to 3 oz. This is about the same size as a deck of cards. ? 2 servings of low-fat dairy each day. ? Heart-healthy fats. Healthy fats called omega-3 fatty acids are found in foods such  as flaxseed and cold-water fish like sardines, salmon, and mackerel.  Aim to eat 25-35 g (grams) of fiber a day. Foods that are high in fiber include apples, broccoli, carrots, beans, peas, and whole grains.  Do not add salt or condiments that contain salt (such as soy sauce) to foods before eating.  When eating at a restaurant, ask that your food be prepared with less salt or no salt, if possible.  Try to eat 2 or more vegetarian meals each week.  Eat more home-cooked food and eat less restaurant, buffet, and fast food. Recommended foods The items listed may not be a complete list. Talk with your dietitian about what dietary choices are best for you. Grains Bread with less than 80 mg of sodium per slice. Whole-wheat pasta, quinoa, and brown rice. Oats and oatmeal. Barley. [place]. Grits and cream of wheat. Whole-grain and whole-wheat cold cereal. Vegetables All fresh vegetables. Vegetables that are frozen without sauce or added salt. Low-sodium or sodium-free canned vegetables. Fruits All fresh, frozen, and canned fruits. Dried fruits, such as raisins, prunes, and cranberries. Meats and other protein foods Lean cuts of meat. Skinless chicken and [place]. Fish with high omega-3 fatty acids, such as salmon,  sardines, and other cold-water fishes. Eggs. Dried beans, peas, and edamame. Unsalted nuts and nut butters. Dairy Low-fat or nonfat (skim) milk and dried milk. Rice milk, soy milk, and almond milk. Low-fat or nonfat yogurt. Small amounts of reduced-sodium block cheese. Low-sodium cottage cheese. Fats and oils Olive, canola, soybean, flaxseed, or sunflower oil. Avocado. Sweets and desserts Apple sauce. Granola bars. Sugar-free pudding and gelatin. Frozen fruit bars. Seasoning and other foods Fresh and dried herbs. Lemon or lime juice. Vinegar. Low-sodium ketchup. Salt-free marinades, salad dressings, sauces, and seasonings. Foods to avoid The items listed may not be a complete list. Talk with your dietitian about what dietary choices are best for you. Grains Bread with more than 80 mg of sodium per slice. Hot or cold cereal with more than 140 mg sodium per serving. Salted pretzels and crackers. Pre-packaged breadcrumbs. Bagels, croissants, and biscuits. Vegetables Canned vegetables. Frozen vegetables with sauce or seasonings. Creamed vegetables. [place] fries. Onion rings. Pickled vegetables and sauerkraut. Fruits Fruits that are dried with sodium-containing preservatives. Meats and other protein foods Ribs and chicken wings. Bacon, ham, pepperoni, bologna, salami, and packaged luncheon meats. Hot dogs, bratwurst, and sausage. Canned meat. Smoked meat and fish. Salted nuts and seeds. Dairy Whole milk, half-and-half, and cream. Buttermilk. Processed cheese, cheese spreads, and cheese curds. Regular cottage cheese. Feta cheese. Shredded cheese. String cheese. Fats and oils Butter, lard, shortening, ghee, and bacon fat. Canned and packaged gravies. Seasoning and other foods Onion salt, garlic salt, table salt, and sea salt. Marinades. Regular salad dressings. Relishes, pickles, and olives. Meat flavorings and tenderizers, and bouillon cubes. Horseradish, ketchup, and mustard. Worcestershire sauce.  Teriyaki sauce, soy sauce (including reduced sodium). Hot sauce and Tabasco sauce. Steak sauce, fish sauce, oyster sauce, and cocktail sauce. Taco seasonings. Barbecue sauce. Tartar sauce. Summary  A heart failure eating plan includes changes that limit your intake of sodium and unhealthy fat, and it may help you lose weight or maintain a healthy weight. Your health care provider may also recommend limiting how much fluid you drink.  Most people with heart failure should eat no more than 2,300 mg of salt (sodium) a day. The amount of sodium that is recommended for you may be lower, depending on your condition.  Contact your health care provider  or dietitian before making any major changes to your diet. This information is not intended to replace advice given to you by your health care provider. Make sure you discuss any questions you have with your health care provider. Document Released: 08/15/2016 Document Revised: 08/15/2016 Document Reviewed: 08/15/2016 Elsevier Interactive Patient Education  2018 [place].   Heart Failure Medicines Heart failure is a condition in which the heart cannot pump enough blood through the body. This can cause symptoms such as shortness of breath, fatigue, and confusion. There are two types of heart failure:  Heart failure with reduced ejection fraction. In this type, the heart muscle is weak.  Heart failure with preserved ejection fraction. In this type, the heart muscle does not fill with blood properly and may be stiff.  There is no cure for heart failure. However, being treated with medicines and following your health care provider's instructions about a healthy lifestyle can help you stay active, avoid problems, and live longer. Talk to your health care provider about all medicines that you are taking, how often you should take them, and what possible problems (side effects) they may cause. Talk with your health care provider if you have difficulty  affording your medicines. What are some common medicines for heart failure? The medicines that are prescribed for you will depend on your symptoms, the type of heart failure you have, and the cause of your heart failure. In some cases, you may need to take more than one medicine. You will be prescribed the following medicines according to your type of heart failure: Heart failure with reduced ejection fraction  Beta-blockers.  Angiotensin-converting enzyme (ACE) inhibitors.  Angiotensin II receptor blockers (ARBs).  Angiotensin receptor neprilysin inhibitors (ARNIs).  Aldosterone antagonists.  Diuretics.  Digoxin.  Nitrates. Heart failure with preserved ejection fraction  Medicines to control blood pressure, including: ? Beta-blockers. ? Angiotensin-converting enzyme (ACE) inhibitors. ? Angiotensin II receptor blockers (ARBs).  Diuretics.  Aldosterone antagonists. What should I know about beta-blockers?  These medicines lower your blood pressure and slow your heart rate. This helps to lessen your heart's workload.  They can help to relieve chest pain (angina).  They can help to improve your heart's ability to pump.  They may cause asthma attacks and shortness of breath.  Because these medicines slow your heart rate, it is important not to overwork yourself while exercising. Talk to your health care provider about what your target heart rate should be while you exercise.  These medicines can hide the symptoms of low blood sugar (glucose), which is also called hypoglycemia. If you have diabetes, make sure to check your blood glucose carefully. If you have hypoglycemia, talk to your health care provider about adjusting your medicines.  Beta-blockers may make you feel dizzy or light-headed at first. Do not drive or use heavy machinery when you first start these medicines. Ask your health care provider when it is safe for you to drive. What should I know about ACE inhibitors  or ARBs?  These medicines help to widen arteries and veins. This action lowers your blood pressure and lessens the strain on your heart, making it easier for your heart to pump.  They can help to lessen the symptoms of heart failure.  ARBs are often used if a person cannot take ACE inhibitors.  ACE inhibitors may cause a dry cough.  In rare cases, ACE inhibitors and ARBs can cause swelling of the tongue or lips, other swelling, taste problems, and skin rashes. If these  symptoms occur, stop taking the medicines and contact your health care provider.  Do not take ACE inhibitors if you are pregnant or may become pregnant. These medicines can cause health problems in an unborn baby.  These medicines may cause dizziness. You may need regular checkups and blood tests to monitor how they are working. What should I know about ARNIs?  These medicines are a combination of an ARB and another medicine. They lower your blood pressure.  Side effects may include dry cough, dizziness, low blood pressure, and kidney problems.  Do not take ARNIs if you are already taking ACE inhibitors or ARBs.  You may notice increased urination when taking these medicines.  ARNIs can raise the amount of potassium in the blood. Your potassium levels will be monitored regularly by your health care provider. What should I know about aldosterone antagonists?  They help the body to remove excess sodium through urination. This helps to lessen the amount of blood that the heart needs to pump.  They can also help to lower blood pressure and improve the heart's ability to pump blood.  They may cause dizziness, diarrhea, coughing, or flu-like symptoms.  They should not be used if you have type 2 diabetes.  They can raise the amount of potassium in the blood. Your potassium levels will be monitored regularly by your health care provider.  These medicines can make men's breasts large and tender. What should I know about  diuretics?  Diuretics are medicines that help the body get rid of excess fluid through urination. They can also help lessen your heart's workload.  They help to lessen fluid buildup in the lungs, ankles, and feet.  They help to lower your blood pressure.  They can worsen problems with controlling urination (urinary incontinence).  They may cause dizziness, headaches, muscle cramps, and an upset stomach.  They can cause weak muscles, dry mouth, or confusion. It is important to drink plenty of fluids while taking these medicines, especially while exercising or on hot days. What should I know about digoxin?  Digoxin helps the heart pump more blood efficiently. It also lowers your heart rate.  It can help ease heart failure symptoms and may be used if other medicines do not work.  It can also help with irregular heartbeat (arrhythmia).  It may cause stomach problems, fatigue, headache, drowsiness, or vision problems. What should I know about nitrates?  Nitrates relax the blood vessels and increase oxygen and blood supply to the heart. They also lower the blood pressure.  They are usually taken to lessen chest pain.  They may cause headaches, flushing, or irregular heartbeat. Summary  A healthy lifestyle and treatment with medicine will relieve symptoms of heart failure.  In some cases, you may need to take more than one medicine.  It is important to talk to your health care provider about how often you should take your medicines. Do not skip a dose or change your dosage.  Talk to your heaLth care provider about possible side effects of these medicines. This information is not intended to replace advice given to you by your health care provider. Make sure you discuss any questions you have with your health care provider. Document Released: 08/15/2016 Document Revised: 08/15/2016 Document Reviewed: 08/15/2016 Elsevier Interactive Patient Education  2018 [employer].   Heart  Failure Exacerbation  Heart failure is a condition in which the heart does not fill up with enough blood, and therefore does not pump enough blood and oxygen to  the body. When this happens, parts of the body do not get the blood and oxygen they need to function properly. This can cause symptoms such as breathing problems, fatigue, swelling, and confusion. Heart failure exacerbation refers to heart failure symptoms that get worse. The symptoms may get worse suddenly or develop slowly over time. Heart failure exacerbation is a serious medical problem that should be treated right away. What are the causes? A heart failure exacerbation can be triggered by:  Not taking your heart failure medicines correctly.  Infections.  Eating an unhealthy diet or a diet that is high in salt (sodium).  Drinking too much fluid.  Drinking alcohol.  Taking illegal drugs, such as cocaine or methamphetamine.  Not exercising.  Other causes include:  Other heart conditions such as an irregular heartbeat (arrhythmia).  Anemia.  Other medical problems, such as kidney failure.  Sometimes the cause of the exacerbation is not known. What are the signs or symptoms? When heart failure symptoms suddenly or slowly get worse, this may be a sign of heart failure exacerbation. Symptoms of heart failure include:  Breathing problems or shortness of breath.  Chronic coughing or wheezing.  Fatigue.  Nausea or lack of appetite.  Feeling light-headed.  Confusion or memory loss.  Increased heart rate or irregular heartbeat.  Buildup of fluid in the legs, ankles, feet, or abdomen.  Difficulty breathing when lying down.  How is this diagnosed? This condition is diagnosed based on:  Your symptoms and medical history.  A physical exam.  You may also have tests, including:  Electrocardiogram (ECG). This test measures the electrical activity of your heart.  Echocardiogram. This test uses sound waves to take  a picture of your heart to see how well it works.  Blood tests.  Imaging tests, such as: ? Chest X-ray. ? MRI. ? Ultrasound.  Stress test. This test examines how well your heart functions when you exercise. Your heart is monitored while you exercise on a treadmill or exercise bike. If you cannot exercise, medicines may be used to increase your heartbeat in place of exercise.  Cardiac catheterization. During this test, a thin, flexible tube (catheter) is inserted into a blood vessel and threaded up to your heart. This test allows your health care provider to check the arteries that lead to your heart (coronary arteries).  Right heart catheterization. During this test, the pressure in your heart is measured.  How is this treated? This condition may be treated by:  Adjusting your heart medicines.  Maintaining a healthy lifestyle. This includes: ? Eating a heart-healthy diet that is low in sodium. ? Not using any products that contain nicotine or tobacco, such as cigarettes and e-cigarettes. ? Regular exercise. ? Monitoring your fluid intake. ? Monitoring your weight and reporting changes to your health care provider.  Treating sleep apnea, if you have this condition.  Surgery. This may include: ? Implanting a device that helps both sides of your heart contract at the same time (cardiac resynchronization therapy device). This can help with heart function and relieve heart failure symptoms. ? Implanting a device that can correct heart rhythm problems (implantable cardioverter defibrillator). ? Connecting a device to your heart to help it pump blood (ventricular assist device). ? Heart transplant.  Follow these instructions at home: Medicines  Take over-the-counter and prescription medicines only as told by your health care provider.  Do not stop taking your medicines or change the amount you take. If you are having problems or side effects  from your medicines, talk to your health  care provider.  If you are having difficulty paying for your medicines, contact a social worker or your clinic. There are many programs to assist with medicine costs.  Talk to your health care provider before starting any new medicines or supplements.  Make sure your health care provider and pharmacist have a list of all the medicines you are taking. Eating and drinking  Avoid drinking alcohol.  Eat a heart-healthy diet as told by your health care provider. This includes: ? Plenty of fruits and vegetables. ? Lean proteins. ? Low-fat dairy. ? Whole grains. ? Foods that are low in sodium. Activity  Exercise regularly as told by your health care provider. Balance exercise with rest.  Ask your health care provider what activities are safe for you. This includes sexual activity, exercise, and daily tasks at home or work. Lifestyle  Do not use any products that contain nicotine or tobacco, such as cigarettes and e-cigarettes. If you need help quitting, ask your health care provider.  Maintain a healthy weight. Ask your health care provider what weight is healthy for you.  Consider joining a patient support group. This can help with emotional problems you may have, such as stress and anxiety. General instructions  Talk to your health care provider about flu and pneumonia vaccines.  Keep a list of medicines that you are taking. This may help in emergency situations.  Keep all follow-up visits as told by your health care provider. This is important. Contact a health care provider if:  You have questions about your medicines or you miss a dose.  You feel anxious, depressed, or stressed.  You have swelling in your feet, ankles, legs, or abdomen.  You have shortness of breath during activity or exercise.  You have a cough.  You have a fever.  You have trouble sleeping.  You gain 2-3 lb (1-1.4 kg) in 24 hours or 5 lb (2.3 kg) in a week. Get help right away if:  You have  chest pain.  You have shortness of breath while resting.  You have severe fatigue.  You are confused.  You have severe dizziness.  You have a rapid or irregular heartbeat.  You have nausea or you vomit.  You have a cough that is worse at night or you cannot lie flat.  You have a cough that will not go away.  You have severe depression or sadness. Summary  When heart failure symptoms get worse, it is called heart failure exacerbation.  Common causes of this condition include taking medicines incorrectly, infections, and drinking alcohol.  This condition may be treated by adjusting medicines, maintaining a healthy lifestyle, or surgery.  Do not stop taking your medicines or change the amount you take. If you are having problems or side effects from your medicines, talk to your health care provider. This information is not intended to replace advice given to you by your health care provider. Make sure you discuss any questions you have with your health care provider. Document Released: 08/12/2016 Document Revised: 08/12/2016 Document Reviewed: 08/12/2016 Elsevier Interactive Patient Education  2018 [employer].    Constipation, Adult Constipation is when a person has fewer bowel movements in a week than normal, has difficulty having a bowel movement, or has stools that are dry, hard, or larger than normal. Constipation may be caused by an underlying condition. It may become worse with age if a person takes certain medicines and does not take in enough  fluids. Follow these instructions at home: Eating and drinking   Eat foods that have a lot of fiber, such as fresh fruits and vegetables, whole grains, and beans.  Limit foods that are high in fat, low in fiber, or overly processed, such as french fries, hamburgers, cookies, candies, and soda.  Drink enough fluid to keep your urine clear or pale yellow. General instructions  Exercise regularly or as told by your health care  provider.  Go to the restroom when you have the urge to go. Do not hold it in.  Take over-the-counter and prescription medicines only as told by your health care provider. These include any fiber supplements.  Practice pelvic floor retraining exercises, such as deep breathing while relaxing the lower abdomen and pelvic floor relaxation during bowel movements.  Watch your condition for any changes.  Keep all follow-up visits as told by your health care provider. This is important. Contact a health care provider if:  You have pain that gets worse.  You have a fever.  You do not have a bowel movement after 4 days.  You vomit.  You are not hungry.  You lose weight.  You are bleeding from the anus.  You have thin, pencil-like stools. Get help right away if:  You have a fever and your symptoms suddenly get worse.  You leak stool or have blood in your stool.  Your abdomen is bloated.  You have severe pain in your abdomen.  You feel dizzy or you faint. This information is not intended to replace advice given to you by your health care provider. Make sure you discuss any questions you have with your health care provider. Document Released: 12/28/2003 Document Revised: 10/19/2015 Document Reviewed: 09/19/2015 Elsevier Interactive Patient Education  2018 [employer].

## 2017-09-08 NOTE — Progress Notes (Signed)
ANTICOAGULATION CONSULT NOTE   Pharmacy Consult for warfarin  Indication: atrial fibrillation  Patient Measurements: Height: 6' (182.9 cm) Weight: 198 lb 6.6 oz (90 kg) IBW/kg (Calculated) : 77.6 Heparin Dosing Weight: n/a  Vital Signs: Temp: 99.1 F (37.3 C) (05/28 0433) Temp Source: Oral (05/28 0433) BP: 111/71 (05/28 0433) Pulse Rate: 96 (05/28 0433)  Labs: Recent Labs    09/06/17 0520 09/07/17 0640 09/08/17 0429  HGB 11.5* 11.9* 12.9*  HCT 35.9* 36.5* 39.2  PLT 216 238 246  LABPROT 29.5* 25.7* 25.0*  INR 2.83 2.37 2.29  CREATININE 0.77 0.80 0.85    Estimated Creatinine Clearance: 74.8 mL/min (by C-G formula based on SCr of 0.85 mg/dL).   Assessment: INR at goal, no bleeding noted.   Goal of Therapy:  INR 2-3 Monitor platelets by anticoagulation protocol: Yes   Plan:  Warfarin 5mg  today  Monitor CBC, INR and patient progress Monitor for signs and symptoms of bleeding.   Pricilla Larsson 09/08/2017,9:17 AM

## 2017-09-08 NOTE — Discharge Summary (Signed)
Physician Discharge Summary  Abimelec Grochowski XNA:[id number] DOB: 05/18/1934 DOA: 09/04/2017  PCP: [facility]  Admit date: 09/04/2017 Discharge date: 09/08/2017  Admitted From: Home  Disposition: Home  Recommendations for Outpatient Follow-up:  1. Follow up with PCP in 1 weeks 2. Please obtain BMP/CBC in 1 week 3. Follow up with urologist in 7-10 days to have foley evaluated and possibly removed 4. Follow up with cardiology in 2 weeks.  5. Please follow up on the following pending results: final cultures  Discharge Condition: STABLE   CODE STATUS: FULL    Brief Hospitalization Summary: Please see all hospital notes, images, labs for full details of the hospitalization.  HPI: Michael Stephens is a 83 y.o. male with medical history significant of diastolic heart failure, hypertension, atrial flutter on anticoagulation, presents to the hospital with complaint shortness of breath.  Patient reports he had progressive shortness of breath for the past few months.  He recently  saw his cardiologist where Lasix dose was increased.  Overnight, his shortness of breath got substantially worse.  Denies any chest pain.  He reports compliance with medications.  He is also noticed worsening lower extremity edema.  On further questioning, he admits to noncompliance with sodium intake as well as fluid intake.  He was also having significant indigestion last night.  He drank 2 cups of coffee and subsequently vomited.  He has not had any diarrhea, in fact stays more constipated.  ED Course: In the emergency room, he was noted to be hypoxic on room air.  He was also tachycardic with atrial flutter.  Started on Cardizem infusion.  Chest x-ray showed decompensated CHF and he was started on intravenous Lasix.  He is been referred for admission.  Brief Narrative:  83 year old male admitted to the hospital with shortness of breath and found to be in decompensated CHF.  His rapid atrial flutter and was  initially placed on Cardizem infusion, but this is been weaned off.  He is currently receiving intravenous diuresis and management of his heart rate with metoprolol and diltiazem.   Assessment & Plan:   Active Problems:   Atrial flutter (HCC)   Essential hypertension   Acute respiratory failure with hypoxia (HCC)   CHF exacerbation (HCC)   Acute on chronic diastolic CHF (congestive heart failure) ([place])   1. Acute respiratory failure with hypoxia.  Due to acute decompensated CHF. Clinically he appears much improved and back to baseline.  He ambulated with PT with no further recommendations from them.  2. Acute on chronic diastolic congestive heart failure.  Related to noncompliance with sodium intake as well as fluid intake but much more likely exacerbated by urinary retention and not able to empty bladder.  He is much better now that we have placed a foley catheter and started him on flomax.  Resume his regular 40 mg oral lasix daily at discharge.  Net volume status is -5.5 L.  He was treated with intravenous Lasix in the hospital.  Continue on beta-blockers.  Started on low-dose losartan. Chest xray demonstrates resolution of pulmonary edema.   3. Chronic constipation - add more stool softeners and follow.  Check Abd xray.  Check bladder scan.  4. Atrial flutter.  Currently on metoprolol and diltiazem was added.  He is on diltiazem 120mg  daily.  Heart rate is much better controlled now.    He is anticoagulated with Coumadin.  Continue to follow heart rates. 5. Hypertension.  Blood pressure stable.  Continue on metoprolol, losartan, lasix.  Follow up with cardiology in 2 weeks.  6. GERD - added protonix daily while in hospital.   7. Urinary retention - post void bladder scan with greater than 800 cc.  Foley was placed and he has diuresed very well and feeling much better.  Will send home with foley catheter and plan to have him follow up with urology in 1 week.  He says he wants to go to  urology in [place] because it is closer to him.  He did not want to come to the urology clinic in [place] due to distance away from home.  He said that he would call and make appointment to see the urology clinic near his home.    DVT prophylaxis: Warfarin Code Status: Full code Family Communication: Discussed with wife Disposition Plan: Discharge home  Discharge Diagnoses:  Active Problems:   Atrial flutter (HCC)   Essential hypertension   Acute respiratory failure with hypoxia (HCC)   CHF exacerbation (HCC)   Acute on chronic diastolic CHF (congestive heart failure) ([place])   Urinary retention   Constipation  Discharge Instructions: Discharge Instructions    (HEART FAILURE PATIENTS) Call MD:  Anytime you have any of the following symptoms: 1) 3 pound weight gain in 24 hours or 5 pounds in 1 week 2) shortness of breath, with or without a dry hacking cough 3) swelling in the hands, feet or stomach 4) if you have to sleep on extra pillows at night in order to breathe.   Complete by:  As directed    Call MD for:  difficulty breathing, headache or visual disturbances   Complete by:  As directed    Call MD for:  extreme fatigue   Complete by:  As directed    Diet - low sodium heart healthy   Complete by:  As directed    Increase activity slowly   Complete by:  As directed      Allergies as of 09/08/2017      Reactions   Penicillins    Has patient had a PCN reaction causing immediate rash, facial/tongue/throat swelling, SOB or lightheadedness with hypotension: Yes Has patient had a PCN reaction causing severe rash involving mucus membranes or skin necrosis: No Has patient had a PCN reaction that required hospitalization: No Has patient had a PCN reaction occurring within the last 10 years: No If all of the above answers are "NO", then may proceed with Cephalosporin use.      Medication List    TAKE these medications   diltiazem 120 MG 24 hr capsule Commonly known as:  CARDIZEM  CD Take 1 capsule (120 mg total) by mouth daily. Start taking on:  09/09/2017   furosemide 40 MG tablet Commonly known as:  LASIX Take 1 tablet (40 mg total) by mouth daily.   losartan 25 MG tablet Commonly known as:  COZAAR Take 1 tablet (25 mg total) by mouth daily. Start taking on:  09/09/2017   metoprolol tartrate 100 MG tablet Commonly known as:  LOPRESSOR Take 100 mg by mouth 2 (two) times daily.   polyethylene glycol packet Commonly known as:  MIRALAX / GLYCOLAX Take 17 g by mouth daily.   potassium chloride SA 20 MEQ tablet Commonly known as:  K-DUR,KLOR-CON Take 1 tablet (20 mEq total) by mouth daily. Start taking on:  09/09/2017   tamsulosin 0.4 MG Caps capsule Commonly known as:  FLOMAX Take 1 capsule (0.4 mg total) by mouth daily after breakfast. Start taking on:  09/09/2017  warfarin 5 MG tablet Commonly known as:  COUMADIN Take as directed. If you are unsure how to take this medication, talk to your nurse or doctor. Original instructions:  Take 1 tablet (5 mg total) by mouth daily.      Follow-up Information    [facility]. Schedule an appointment as soon as possible for a visit in 1 week(s).   Contact information: [place] [address] [phone]        Arnoldo Lenis, MD. Schedule an appointment as soon as possible for a visit in 2 week(s).   Specialty:  Cardiology Contact information: [place] [address] [place]. Schedule an appointment as soon as possible for a visit in 1 week(s).   Specialty:  Urology Why:  Hospital Follow Up for urinary retention Contact information: [address] [place] [address] [phone]          Allergies  Allergen Reactions  . Penicillins     Has patient had a PCN reaction causing immediate rash, facial/tongue/throat swelling, SOB or lightheadedness with hypotension: Yes Has patient had a PCN reaction  causing severe rash involving mucus membranes or skin necrosis: No Has patient had a PCN reaction that required hospitalization: No Has patient had a PCN reaction occurring within the last 10 years: No If all of the above answers are "NO", then may proceed with Cephalosporin use.   Allergies as of 09/08/2017      Reactions   Penicillins    Has patient had a PCN reaction causing immediate rash, facial/tongue/throat swelling, SOB or lightheadedness with hypotension: Yes Has patient had a PCN reaction causing severe rash involving mucus membranes or skin necrosis: No Has patient had a PCN reaction that required hospitalization: No Has patient had a PCN reaction occurring within the last 10 years: No If all of the above answers are "NO", then may proceed with Cephalosporin use.      Medication List    TAKE these medications   diltiazem 120 MG 24 hr capsule Commonly known as:  CARDIZEM CD Take 1 capsule (120 mg total) by mouth daily. Start taking on:  09/09/2017   furosemide 40 MG tablet Commonly known as:  LASIX Take 1 tablet (40 mg total) by mouth daily.   losartan 25 MG tablet Commonly known as:  COZAAR Take 1 tablet (25 mg total) by mouth daily. Start taking on:  09/09/2017   metoprolol tartrate 100 MG tablet Commonly known as:  LOPRESSOR Take 100 mg by mouth 2 (two) times daily.   polyethylene glycol packet Commonly known as:  MIRALAX / GLYCOLAX Take 17 g by mouth daily.   potassium chloride SA 20 MEQ tablet Commonly known as:  K-DUR,KLOR-CON Take 1 tablet (20 mEq total) by mouth daily. Start taking on:  09/09/2017   tamsulosin 0.4 MG Caps capsule Commonly known as:  FLOMAX Take 1 capsule (0.4 mg total) by mouth daily after breakfast. Start taking on:  09/09/2017   warfarin 5 MG tablet Commonly known as:  COUMADIN Take as directed. If you are unsure how to take this medication, talk to your nurse or doctor. Original instructions:  Take 1 tablet (5 mg total) by mouth  daily.       Procedures/Studies: Dg Chest Port 1 View  Result Date: 09/08/2017 CLINICAL DATA:  CHF.  Atrial fibrillation. EXAM: PORTABLE CHEST 1 VIEW COMPARISON:  09/07/2017. FINDINGS: Stable cardiomegaly. Interim  near complete clearing of pulmonary interstitial prominence. No pleural effusion or pneumothorax. No acute bony abnormality. IMPRESSION: Stable cardiomegaly. Interim near complete clearing of pulmonary interstitial prominence consistent with clearing pulmonary interstitial edema. Electronically Signed   By: Marcello Moores  Register   On: 09/08/2017 07:19   Dg Chest Port 1 View  Result Date: 09/07/2017 CLINICAL DATA:  Follow-up CHF. EXAM: PORTABLE CHEST 1 VIEW COMPARISON:  09/04/2017 and 05/08/2017. FINDINGS: Vascular congestion and interstitial thickening has improved, but not resolved. No new lung opacities. No convincing pleural effusion.  No pneumothorax. Cardiac silhouette is normal in size. IMPRESSION: 1. Improved interstitial pulmonary edema.  No new abnormalities. Electronically Signed   By: Lajean Manes M.D.   On: 09/07/2017 07:40   Dg Chest Port 1 View  Result Date: 09/04/2017 CLINICAL DATA:  Shortness of breath. EXAM: PORTABLE CHEST 1 VIEW COMPARISON:  Two-view chest x-ray 05/08/2017 FINDINGS: Heart is enlarged. Diffuse interstitial pattern is present. Bibasilar opacities are noted. No significant airspace consolidation is present otherwise. IMPRESSION: 1. Cardiomegaly with increased interstitial pattern compatible with congestive heart failure. 2. Minimal bibasilar opacities likely reflect atelectasis. Electronically Signed   By: San Morelle M.D.   On: 09/04/2017 07:16   Dg Abd 2 Views  Result Date: 09/07/2017 CLINICAL DATA:  Abdominal pain for 2 weeks. EXAM: ABDOMEN - 2 VIEW COMPARISON:  None. FINDINGS: The bowel gas pattern is normal. There is no evidence of free air. Moderate amount of formed stool throughout the colon. No radio-opaque calculi or other significant  radiographic abnormality is seen. IMPRESSION: Nonobstructive bowel gas pattern. Electronically Signed   By: Fidela Salisbury M.D.   On: 09/07/2017 10:47      Subjective: Pt reports that he feels much better today.  His bladder is relieved since foley was placed.    Discharge Exam: Vitals:   09/07/17 2153 09/08/17 0433  BP: 117/60 111/71  Pulse:  96  Resp:  16  Temp:  99.1 F (37.3 C)  SpO2:  93%   Vitals:   09/07/17 1352 09/07/17 2138 09/07/17 2153 09/08/17 0433  BP: 118/60 115/79 117/60 111/71  Pulse: (!) 48 77  96  Resp:  20  16  Temp: 98.8 F (37.1 C) 98.9 F (37.2 C)  99.1 F (37.3 C)  TempSrc: Oral Oral  Oral  SpO2: 92% 92%  93%  Weight:    90 kg (198 lb 6.6 oz)  Height:       General exam: Appears calm and comfortable  Respiratory system: improved air sounds, rare crackles heard. Respiratory effort normal. Cardiovascular system: S1, S2, irregular no JVD, murmurs, rubs, gallops or clicks.  Trace pretibial edema. Gastrointestinal system: Abdomen is nondistended, soft and nontender. No organomegaly or masses felt. Normal bowel sounds heard. Central nervous system: Alert and oriented. No focal neurological deficits. GU: indwelling foley in place, free flowing urine seen in bag.  Extremities: Symmetric 5 x 5 power. Trace pretibial edema BLEs.  Skin: No rashes, lesions or ulcers Psychiatry: Judgement and insight appear normal. Mood & affect appropriate.     The results of significant diagnostics from this hospitalization (including imaging, microbiology, ancillary and laboratory) are listed below for reference.     Microbiology: Recent Results (from the past 240 hour(s))  MRSA PCR Screening     Status: None   Collection Time: 09/04/17  9:16 AM  Result Value Ref Range Status   MRSA by PCR NEGATIVE NEGATIVE Final    Comment:        The GeneXpert MRSA  Assay (FDA approved for NASAL specimens only), is one component of a comprehensive MRSA  colonization surveillance program. It is not intended to diagnose MRSA infection nor to guide or monitor treatment for MRSA infections. Performed at [facility], [address], [place], [place] [address]      Labs: BNP (last 3 results) Recent Labs    02/10/17 0619 03/08/17 1136 09/04/17 0648  BNP 212.0* 408.0* 003.7*   Basic Metabolic Panel: Recent Labs  Lab 09/04/17 0648 09/05/17 0408 09/06/17 0520 09/07/17 0640 09/08/17 0429  NA 136 137 138 136 138  K 3.8 3.1* 4.1 3.8 3.4*  CL 100* 97* 101 99* 96*  CO2 28 31 29 31  32  GLUCOSE 198* 122* 119* 98 112*  BUN 29* 27* 31* 33* 27*  CREATININE 0.91 0.84 0.77 0.80 0.85  CALCIUM 9.7 9.0 9.2 9.2 9.4  MG  --  2.0  --   --  2.0   Liver Function Tests: No results for input(s): AST, ALT, ALKPHOS, BILITOT, PROT, ALBUMIN in the last 168 hours. No results for input(s): LIPASE, AMYLASE in the last 168 hours. No results for input(s): AMMONIA in the last 168 hours. CBC: Recent Labs  Lab 09/04/17 0648 09/05/17 0408 09/06/17 0520 09/07/17 0640 09/08/17 0429  WBC 9.1 8.1 7.9 6.7 9.6  NEUTROABS 6.6  --   --   --   --   HGB 12.2* 11.2* 11.5* 11.9* 12.9*  HCT 38.0* 34.8* 35.9* 36.5* 39.2  MCV 95.2 95.6 96.2 95.8 93.6  PLT 207 193 216 238 246   Cardiac Enzymes: Recent Labs  Lab 09/04/17 0648 09/04/17 1113 09/04/17 1729 09/04/17 2251  TROPONINI <0.03 0.16* 0.13* 0.10*   BNP: Invalid input(s): POCBNP CBG: No results for input(s): GLUCAP in the last 168 hours. D-Dimer No results for input(s): DDIMER in the last 72 hours. Hgb A1c No results for input(s): HGBA1C in the last 72 hours. Lipid Profile No results for input(s): CHOL, HDL, LDLCALC, TRIG, CHOLHDL, LDLDIRECT in the last 72 hours. Thyroid function studies No results for input(s): TSH, T4TOTAL, T3FREE, THYROIDAB in the last 72 hours.  Invalid input(s): FREET3 Anemia work up No results for input(s): VITAMINB12, FOLATE, FERRITIN, TIBC, IRON, RETICCTPCT in the last  72 hours. Urinalysis No results found for: COLORURINE, APPEARANCEUR, [place], [employer], [place], [place], [place], [place], PROTEINUR, UROBILINOGEN, NITRITE, LEUKOCYTESUR Sepsis Labs Invalid input(s): PROCALCITONIN,  WBC,  LACTICIDVEN Microbiology Recent Results (from the past 240 hour(s))  MRSA PCR Screening     Status: None   Collection Time: 09/04/17  9:16 AM  Result Value Ref Range Status   MRSA by PCR NEGATIVE NEGATIVE Final    Comment:        The GeneXpert MRSA Assay (FDA approved for NASAL specimens only), is one component of a comprehensive MRSA colonization surveillance program. It is not intended to diagnose MRSA infection nor to guide or monitor treatment for MRSA infections. Performed at [facility], [address] [place], [place] [address]     Time coordinating discharge: [place]  SIGNED:  Irwin Brakeman, MD  [facility] 09/08/2017, 10:50 AM Pager [phone]  If 7PM-7AM, please contact night-coverage [email or web address] Password [email or web address]

## 2017-09-08 NOTE — Progress Notes (Signed)
IV removed, 2x2 gauze and paper tape applied to site, patient tolerated well. Reviewed AVS/ discharge information with patient and patient's wife, both verbalized understanding.  Patient to be transported home via wife.

## 2017-09-08 NOTE — Care Management Note (Signed)
Case Management Note  Patient Details  Name: Michael Stephens MRN: [id number] Date of Birth: April 12, 1935  Subjective/Objective:            Adm with CHF exacerbation. From home with wife, very independent. No HH or DME pta. Evaluated by PT and OT, no recommendations made.     Action/Plan: Anticipate DC home with self care. CM following for ongoing needs.   Expected Discharge Date:     09/08/2017             Expected Discharge Plan:  Home/Self Care  In-House Referral:     Discharge planning Services  CM Consult  Post Acute Care Choice:  NA Choice offered to:  NA  DME Arranged:    DME Agency:     HH Arranged:    HH Agency:     Status of Service:  Completed, signed off  If discussed at [employer] of Stay Meetings, dates discussed:    Additional Comments:  Gianny Sabino, Chauncey [place], RN 09/08/2017, 7:49 AM

## 2017-09-10 ENCOUNTER — Other Ambulatory Visit: Payer: Self-pay

## 2017-09-10 ENCOUNTER — Encounter (HOSPITAL_COMMUNITY): Payer: Self-pay | Admitting: *Deleted

## 2017-09-10 ENCOUNTER — Ambulatory Visit (INDEPENDENT_AMBULATORY_CARE_PROVIDER_SITE_OTHER): Payer: Medicare Other | Admitting: *Deleted

## 2017-09-10 ENCOUNTER — Emergency Department (HOSPITAL_COMMUNITY)
Admission: EM | Admit: 2017-09-10 | Discharge: 2017-09-10 | Disposition: A | Discharge status: Home / Self Care | Payer: Medicare Other | Attending: Emergency Medicine | Admitting: Emergency Medicine

## 2017-09-10 DIAGNOSIS — Z466 Encounter for fitting and adjustment of urinary device: Secondary | ICD-10-CM | POA: Insufficient documentation

## 2017-09-10 DIAGNOSIS — T839XXA Unspecified complication of genitourinary prosthetic device, implant and graft, initial encounter: Secondary | ICD-10-CM

## 2017-09-10 DIAGNOSIS — Z87891 Personal history of nicotine dependence: Secondary | ICD-10-CM | POA: Insufficient documentation

## 2017-09-10 DIAGNOSIS — I4892 Unspecified atrial flutter: Secondary | ICD-10-CM

## 2017-09-10 DIAGNOSIS — T83098A Other mechanical complication of other indwelling urethral catheter, initial encounter: Secondary | ICD-10-CM | POA: Diagnosis not present

## 2017-09-10 DIAGNOSIS — Z79899 Other long term (current) drug therapy: Secondary | ICD-10-CM | POA: Diagnosis not present

## 2017-09-10 DIAGNOSIS — Z7901 Long term (current) use of anticoagulants: Secondary | ICD-10-CM | POA: Diagnosis not present

## 2017-09-10 DIAGNOSIS — Z5181 Encounter for therapeutic drug level monitoring: Secondary | ICD-10-CM

## 2017-09-10 DIAGNOSIS — I110 Hypertensive heart disease with heart failure: Secondary | ICD-10-CM | POA: Insufficient documentation

## 2017-09-10 DIAGNOSIS — I5032 Chronic diastolic (congestive) heart failure: Secondary | ICD-10-CM | POA: Diagnosis not present

## 2017-09-10 LAB — POCT INR: INR: 3.6 — AB (ref 2.0–3.0)

## 2017-09-10 NOTE — ED Notes (Signed)
PT states they had to put a catheter in while he was hospitalized and he was told it should be taken out in a week and follow up with urology. PT's wife states that [facility] until July.

## 2017-09-10 NOTE — ED Triage Notes (Signed)
Pt states he needs to have his foley catheter removed;

## 2017-09-10 NOTE — ED Provider Notes (Signed)
[facility] EMERGENCY DEPARTMENT Provider Note   CSN: [id number] Arrival date & time: 09/10/17  0941     History   Chief Complaint Chief Complaint  Patient presents with  . catheter removal    HPI Michael Stephens is a 83 y.o. male.  Patient states he wants his Foley catheter taken out its been in there 2 days.  The history is provided by the patient. No language interpreter was used.  Illness  This is a new problem. The current episode started 2 days ago. The problem occurs constantly. The problem has not changed since onset.Pertinent negatives include no chest pain, no abdominal pain and no headaches. Nothing aggravates the symptoms. Nothing relieves the symptoms.    Past Medical History:  Diagnosis Date  . Acute diastolic CHF (congestive heart failure) ([place]) 02/11/2017  . Atrial flutter ([place])    a. diagnosed in 02/2017. Rate-control strategy pursued.   . Hypertension   . Renal disorder    cyst on kidney    Patient Active Problem List   Diagnosis Date Noted  . Urinary retention 09/07/2017  . Constipation 09/07/2017  . CHF exacerbation ([place]) 09/04/2017  . Acute on chronic diastolic CHF (congestive heart failure) ([place]) 09/04/2017  . Encounter for therapeutic drug monitoring 08/27/2017  . Change in stool 03/18/2017  . Diarrhea 03/02/2017  . Aortic stenosis 03/02/2017  . CAP (community acquired pneumonia) 02/16/2017  . Essential hypertension 02/11/2017  . Demand ischemia ([place]) 02/11/2017  . Acute diastolic CHF (congestive heart failure) ([place]) 02/11/2017  . Acute respiratory failure with hypoxia ([place]) 02/11/2017  . Atrial flutter ([place]) 02/09/2017    Past Surgical History:  Procedure Laterality Date  . ABCESS DRAINAGE  11/2008   BUTTOCKS  . CATARACT EXTRACTION, BILATERAL    . COLONOSCOPY N/A 04/02/2017   Procedure: COLONOSCOPY;  Surgeon: Rogene Houston, MD;  Location: [facility] ENDO SUITE;  Service: Endoscopy;  Laterality: N/A;  10:55  . POLYPECTOMY  04/02/2017   Procedure: POLYPECTOMY;  Surgeon: Rogene Houston, MD;  Location: [facility] ENDO SUITE;  Service: Endoscopy;;  asceding colon(hot snare)/ sigmoid colon times two (cold snare)        Home Medications    Prior to Admission medications   Medication Sig Start Date End Date Taking? Authorizing Provider  diltiazem (CARDIZEM CD) 120 MG 24 hr capsule Take 1 capsule (120 mg total) by mouth daily. 09/09/17 10/09/17  Johnson, Clanford L, MD  furosemide (LASIX) 40 MG tablet Take 1 tablet (40 mg total) by mouth daily. 08/26/17 11/24/17  Arnoldo Lenis, MD  losartan (COZAAR) 25 MG tablet Take 1 tablet (25 mg total) by mouth daily. 09/09/17 10/09/17  Johnson, Clanford L, MD  metoprolol tartrate (LOPRESSOR) 100 MG tablet Take 100 mg by mouth 2 (two) times daily.    [provider]  polyethylene glycol (MIRALAX / GLYCOLAX) packet Take 17 g by mouth daily. 09/08/17 10/08/17  Johnson, Clanford L, MD  polyethylene glycol powder (GLYCOLAX/MIRALAX) powder Take 17 g by mouth daily as needed. 09/08/17   [provider]  potassium chloride (K-DUR,KLOR-CON) 20 MEQ tablet Take 1 tablet (20 mEq total) by mouth daily. 09/09/17 10/09/17  Murlean Iba, MD  tamsulosin (FLOMAX) 0.4 MG CAPS capsule Take 1 capsule (0.4 mg total) by mouth daily after breakfast. 09/09/17 10/09/17  Irwin Brakeman L, MD  warfarin (COUMADIN) 5 MG tablet Take 1 tablet (5 mg total) by mouth daily. 08/27/17   Arnoldo Lenis, MD    Family History Family History  Problem Relation  Age of Onset  . CVA Father   . CAD Father   . Diabetes Sister   . Colon cancer Neg Hx     Social History Social History   Tobacco Use  . Smoking status: Former Smoker    Types: Cigarettes    Last attempt to quit: 2006    Years since quitting: 13.4  . Smokeless tobacco: Never Used  Substance Use Topics  . Alcohol use: No  . Drug use: No     Allergies   Penicillins   Review of Systems Review of Systems  Constitutional: Negative for  appetite change and fatigue.  HENT: Negative for congestion, ear discharge and sinus pressure.   Eyes: Negative for discharge.  Respiratory: Negative for cough.   Cardiovascular: Negative for chest pain.  Gastrointestinal: Negative for abdominal pain and diarrhea.  Genitourinary: Negative for frequency and hematuria.  Musculoskeletal: Negative for back pain.  Skin: Negative for rash.  Neurological: Negative for seizures and headaches.  Psychiatric/Behavioral: Negative for hallucinations.     Physical Exam Updated Vital Signs BP 121/90   Pulse 96   Temp 98.3 F (36.8 C) (Oral)   Resp 16   SpO2 96%   Physical Exam  Constitutional: He is oriented to person, place, and time. He appears well-developed.  HENT:  Head: Normocephalic.  Eyes: Conjunctivae are normal.  Neck: No tracheal deviation present.  Cardiovascular:  No murmur heard. Abdominal: Soft.  Genitourinary:  Genitourinary Comments: Foley catheter draining appropriately  Musculoskeletal: Normal range of motion.  Neurological: He is oriented to person, place, and time.  Skin: Skin is warm.  Psychiatric: He has a normal mood and affect.     ED Treatments / Results  Labs (all labs ordered are listed, but only abnormal results are displayed) Labs Reviewed - No data to display  EKG None  Radiology No results found.  Procedures Procedures (including critical care time)  Medications Ordered in ED Medications - No data to display   Initial Impression / Assessment and Plan / ED Course  I have reviewed the triage vital signs and the nursing notes.  Pertinent labs & imaging results that were available during my care of the patient were reviewed by me and considered in my medical decision making (see chart for details).     Patient will have Foley catheter removed.  He is instructed to follow-up with urology next week.  If he has problems prior to then he is to return to the emergency department  Final  Clinical Impressions(s) / ED Diagnoses   Final diagnoses:  Foley catheter problem, initial encounter [facility])    ED Discharge Orders    None       Milton Ferguson, MD 09/10/17 1027

## 2017-09-10 NOTE — Patient Instructions (Signed)
Hold coumadin tonight then decrease dose to 1 tablet daily except 1/2 tablet on Mondays and Thursdays Recheck in 2 weeks 

## 2017-09-10 NOTE — Discharge Instructions (Addendum)
Follow-up with [facility] in [place] or [place] next week.  If you have problems urinating over the weekend then go to [facility] in [place]

## 2017-09-22 ENCOUNTER — Ambulatory Visit (INDEPENDENT_AMBULATORY_CARE_PROVIDER_SITE_OTHER): Payer: Medicare Other | Admitting: Cardiology

## 2017-09-22 ENCOUNTER — Ambulatory Visit (INDEPENDENT_AMBULATORY_CARE_PROVIDER_SITE_OTHER): Payer: Medicare Other | Admitting: *Deleted

## 2017-09-22 VITALS — BP 100/63 | HR 90 | Ht 72.0 in | Wt 206.0 lb

## 2017-09-22 DIAGNOSIS — I4891 Unspecified atrial fibrillation: Secondary | ICD-10-CM | POA: Diagnosis not present

## 2017-09-22 DIAGNOSIS — I10 Essential (primary) hypertension: Secondary | ICD-10-CM | POA: Diagnosis not present

## 2017-09-22 DIAGNOSIS — Z5181 Encounter for therapeutic drug level monitoring: Secondary | ICD-10-CM

## 2017-09-22 DIAGNOSIS — I5032 Chronic diastolic (congestive) heart failure: Secondary | ICD-10-CM

## 2017-09-22 DIAGNOSIS — I4892 Unspecified atrial flutter: Secondary | ICD-10-CM | POA: Diagnosis not present

## 2017-09-22 LAB — POCT INR: INR: 3.4 — AB (ref 2.0–3.0)

## 2017-09-22 MED ORDER — FUROSEMIDE 40 MG PO TABS: ORAL_TABLET | ORAL | 1 refills | Status: DC

## 2017-09-22 NOTE — Patient Instructions (Signed)
Hold coumadin tonight then decrease dose to 1 tablet daily except 1/2 tablet on Mondays, Wednesdays and Fridays.  Recheck in 2 weeks

## 2017-09-22 NOTE — Patient Instructions (Signed)
Your physician recommends that you schedule a follow-up appointment in: [place] has recommended you make the following change in your medication:   STOP PRAVASTATIN  CHANGE LASIX 40 MG EVERY OTHER DAY ALTERNATING WITH 60 MG EVERY OTHER DAY   Your physician recommends that you return for lab work in: [place] BMP/MG  Thank you for choosing [facility]!!

## 2017-09-22 NOTE — Progress Notes (Signed)
Clinical Summary Michael Stephens is a 83 y.o.male seen today for follow up of the following medical problems.  1. Atrial arrhythmias - 02/2017 admit noted to have afib, aflutter, and MAT  - admit 07/20/12 with diastolic HF, afib with RVR - oral dilt added to his regimen during that admission.  - no recent palpitaitons. No bleeding on coumadin.    2. Chronic diastolic HF - 48/1856 echoLVEF 50-55%, restrictive diastolic dysfunction   - last visit increased lasix to 40mg  daily. - admit 09/04/17 with acute on chronic diastolic HF.  - issues with urinary retention, had foley placed to help with diuresis. Negative 5.5 liters, discharged on lasix 40mg  daily. Discharge weight 198 lbs - not checking weights. - compliant with meds.     3. Abnormal stress test - nuclear stress with/interolateral infarct with mild to moderate peri-infarct ischemia. - denies any recent chest pain.   4. Muscle aches - on atorvastatin - reports knee and leg pains at rest or with activity.  - we tried pravastatin, symptoms reoccurred.   5. Urinary retention - had catheter placed during recent admission, later removed. Was to f/u with urology.  - awaiting appt with urology.    Past Medical History:  Diagnosis Date  . Acute diastolic CHF (congestive heart failure) ([place]) 02/11/2017  . Atrial flutter ([place])    a. diagnosed in 02/2017. Rate-control strategy pursued.   . Hypertension   . Renal disorder    cyst on kidney     Allergies  Allergen Reactions  . Penicillins     Has patient had a PCN reaction causing immediate rash, facial/tongue/throat swelling, SOB or lightheadedness with hypotension: Yes Has patient had a PCN reaction causing severe rash involving mucus membranes or skin necrosis: No Has patient had a PCN reaction that required hospitalization: No Has patient had a PCN reaction occurring within the last 10 years: No If all of the above answers are "NO", then may proceed with  Cephalosporin use.     Current Outpatient Medications  Medication Sig Dispense Refill  . diltiazem (CARDIZEM CD) 120 MG 24 hr capsule Take 1 capsule (120 mg total) by mouth daily. 30 capsule 0  . furosemide (LASIX) 40 MG tablet Take 1 tablet (40 mg total) by mouth daily. 90 tablet 1  . losartan (COZAAR) 25 MG tablet Take 1 tablet (25 mg total) by mouth daily. 30 tablet 0  . metoprolol tartrate (LOPRESSOR) 100 MG tablet Take 100 mg by mouth 2 (two) times daily.    . polyethylene glycol (MIRALAX / GLYCOLAX) packet Take 17 g by mouth daily. 30 packet 0  . polyethylene glycol powder (GLYCOLAX/MIRALAX) powder Take 17 g by mouth daily as needed.  0  . potassium chloride (K-DUR,KLOR-CON) 20 MEQ tablet Take 1 tablet (20 mEq total) by mouth daily. 30 tablet 0  . tamsulosin (FLOMAX) 0.4 MG CAPS capsule Take 1 capsule (0.4 mg total) by mouth daily after breakfast. 30 capsule 0  . warfarin (COUMADIN) 5 MG tablet Take 1 tablet (5 mg total) by mouth daily. 45 tablet 3   No current facility-administered medications for this visit.      Past Surgical History:  Procedure Laterality Date  . ABCESS DRAINAGE  11/2008   BUTTOCKS  . CATARACT EXTRACTION, BILATERAL    . COLONOSCOPY N/A 04/02/2017   Procedure: COLONOSCOPY;  Surgeon: Rogene Houston, MD;  [facility];  Service: Endoscopy;  Laterality: N/A;  10:55  . POLYPECTOMY  04/02/2017   Procedure:  POLYPECTOMY;  Surgeon: Rogene Houston, MD;  [facility];  Service: Endoscopy;;  asceding colon(hot snare)/ sigmoid colon times two (cold snare)     Allergies  Allergen Reactions  . Penicillins     Has patient had a PCN reaction causing immediate rash, facial/tongue/throat swelling, SOB or lightheadedness with hypotension: Yes Has patient had a PCN reaction causing severe rash involving mucus membranes or skin necrosis: No Has patient had a PCN reaction that required hospitalization: No Has patient had a PCN reaction occurring  within the last 10 years: No If all of the above answers are "NO", then may proceed with Cephalosporin use.      Family History  Problem Relation Age of Onset  . CVA Father   . CAD Father   . Diabetes Sister   . Colon cancer Neg Hx      Social History Michael Stephens reports that he quit smoking about 13 years ago. His smoking use included cigarettes. He has never used smokeless tobacco. Michael Stephens reports that he does not drink alcohol.   Review of Systems CONSTITUTIONAL: No weight loss, fever, chills, weakness or fatigue.  HEENT: Eyes: No visual loss, blurred vision, double vision or yellow sclerae.No hearing loss, sneezing, congestion, runny nose or sore throat.  SKIN: No rash or itching.  CARDIOVASCULAR: per hpi RESPIRATORY: per hpi GASTROINTESTINAL: No anorexia, nausea, vomiting or diarrhea. No abdominal pain or blood.  GENITOURINARY: No burning on urination, no polyuria NEUROLOGICAL: No headache, dizziness, syncope, paralysis, ataxia, numbness or tingling in the extremities. No change in bowel or bladder control.  MUSCULOSKELETAL: No muscle, back pain, joint pain or stiffness.  LYMPHATICS: No enlarged nodes. No history of splenectomy.  PSYCHIATRIC: No history of depression or anxiety.  ENDOCRINOLOGIC: No reports of sweating, cold or heat intolerance. No polyuria or polydipsia.  Marland Kitchen   Physical Examination Vitals:   09/22/17 0853  BP: 100/63  Pulse: 90  SpO2: 94%   Vitals:   09/22/17 0853  Weight: 206 lb (93.4 kg)  Height: 6' (1.829 m)    Gen: resting comfortably, no acute distress HEENT: no scleral icterus, pupils equal round and reactive, no palptable cervical adenopathy,  CV: RRR, no m/r/g, no jvd Resp: Clear to auscultation bilaterally GI: abdomen is soft, non-tender, non-distended, normal bowel sounds, no hepatosplenomegaly MSK: extremities are warm, no edema.  Skin: warm, no rash Neuro:  no focal deficits Psych: appropriate affect   Diagnostic  Studies  02/2017 nuclear stress  There was no ST segment deviation noted during stress.  Findings consistent with prior inferior/inferolateral myocardial infarction with mild to moderate peri-infarct ischemia.  This is a high risk study. High risk based on decreased LVEF. Based on prior infarct and current level of ischemia alone this would sugest low to intermediate risk . Recommend correlate findings with echo  The left ventricular ejection fraction is moderately decreased (30-44%).  01/2017 echo Study Conclusions  - Left ventricle: The cavity size was normal. Wall thickness was increased in a pattern of mild LVH. Systolic function was normal. The estimated ejection fraction was in the range of 50% to 55%. Doppler parameters are consistent with restrictive physiology, indicative of decreased left ventricular diastolic compliance and/or increased left atrial pressure. Doppler parameters are consistent with high ventricular filling pressure. - Aortic valve: Mildly calcified annulus. Trileaflet; mildly thickened leaflets. There was very mild stenosis. Valve area (VTI): 1.59 cm^2. Valve area (Vmax): 1.73 cm^2. - Left atrium: The atrium was severely dilated. - Right atrium: The atrium  was mildly dilated. - Atrial septum: No defect or patent foramen ovale was identified. - Inferior vena cava: The vessel was dilated. The respirophasic diameter changes were blunted (<50%), consistent with elevated central venous pressure. - Technically difficult study.       Assessment and Plan  1. Afib -no recurrent symptoms, continue current meds. NOACs too expensive, continue coumadin.   2. Chronic diastolic HF -some recent issues with edema. Change lasix to 60mg  alternating days with 40mg . Check BMET/Mg in 2 weeks.   3. Abnormal stress test - no symptoms - continue to monitor.   4. Hyperlipidemia - muscle aches on pravastatin, we will d/c   F/u 1  month      Arnoldo Lenis, M.D.

## 2017-09-28 ENCOUNTER — Encounter: Payer: Self-pay | Admitting: Cardiology

## 2017-10-06 ENCOUNTER — Ambulatory Visit (INDEPENDENT_AMBULATORY_CARE_PROVIDER_SITE_OTHER): Payer: Medicare Other | Admitting: Pharmacist

## 2017-10-06 DIAGNOSIS — I4892 Unspecified atrial flutter: Secondary | ICD-10-CM

## 2017-10-06 DIAGNOSIS — Z5181 Encounter for therapeutic drug level monitoring: Secondary | ICD-10-CM

## 2017-10-06 LAB — POCT INR: INR: 3.7 — AB (ref 2.0–3.0)

## 2017-10-06 NOTE — Patient Instructions (Signed)
Description   Hold coumadin tonight then decrease dose to 1/2 tablet daily except 1 tablet on Sundays, Tuesdays, and Thursdays.  Recheck in 2 weeks

## 2017-10-08 DIAGNOSIS — I509 Heart failure, unspecified: Secondary | ICD-10-CM | POA: Diagnosis not present

## 2017-10-08 DIAGNOSIS — Z6829 Body mass index (BMI) 29.0-29.9, adult: Secondary | ICD-10-CM | POA: Diagnosis not present

## 2017-10-08 DIAGNOSIS — I4892 Unspecified atrial flutter: Secondary | ICD-10-CM | POA: Diagnosis not present

## 2017-10-08 DIAGNOSIS — N401 Enlarged prostate with lower urinary tract symptoms: Secondary | ICD-10-CM | POA: Diagnosis not present

## 2017-10-08 DIAGNOSIS — R339 Retention of urine, unspecified: Secondary | ICD-10-CM | POA: Diagnosis not present

## 2017-10-20 ENCOUNTER — Ambulatory Visit (INDEPENDENT_AMBULATORY_CARE_PROVIDER_SITE_OTHER): Payer: Medicare Other | Admitting: *Deleted

## 2017-10-20 DIAGNOSIS — I4892 Unspecified atrial flutter: Secondary | ICD-10-CM | POA: Diagnosis not present

## 2017-10-20 DIAGNOSIS — Z5181 Encounter for therapeutic drug level monitoring: Secondary | ICD-10-CM

## 2017-10-20 LAB — POCT INR: INR: 2 (ref 2.0–3.0)

## 2017-10-20 NOTE — Patient Instructions (Signed)
Continue coumadin 1/2 tablet daily except 1 tablet on Sundays, Tuesdays, and Thursdays.  Recheck in 3 weeks

## 2017-10-21 DIAGNOSIS — I10 Essential (primary) hypertension: Secondary | ICD-10-CM | POA: Diagnosis not present

## 2017-10-26 ENCOUNTER — Encounter: Payer: Self-pay | Admitting: *Deleted

## 2017-10-26 ENCOUNTER — Encounter: Payer: Self-pay | Admitting: Cardiology

## 2017-10-26 ENCOUNTER — Ambulatory Visit (INDEPENDENT_AMBULATORY_CARE_PROVIDER_SITE_OTHER): Payer: Medicare Other | Admitting: Cardiology

## 2017-10-26 VITALS — BP 100/66 | HR 96 | Ht 72.0 in | Wt 209.6 lb

## 2017-10-26 DIAGNOSIS — I4891 Unspecified atrial fibrillation: Secondary | ICD-10-CM

## 2017-10-26 DIAGNOSIS — I5032 Chronic diastolic (congestive) heart failure: Secondary | ICD-10-CM | POA: Diagnosis not present

## 2017-10-26 MED ORDER — FUROSEMIDE 40 MG PO TABS: 60.0000 mg | ORAL_TABLET | Freq: Every day | ORAL | Status: DC

## 2017-10-26 NOTE — Patient Instructions (Addendum)
Medication Instructions:   Increase Lasix to 60mg  daily.   Continue all other medications.    Labwork: none  Testing/Procedures: none  Follow-Up: 6 weeks   Any Other Special Instructions Will Be Listed Below (If Applicable). Please call office on Friday to give [place] update on your weights.    If you need a refill on your cardiac medications before your next appointment, please call your pharmacy.

## 2017-10-26 NOTE — Progress Notes (Signed)
Clinical Summary Michael Stephens is a 83 y.o.male seen today for follow up of the following medical problems.This is a focused visit on recent SOB.   1. Atrial arrhythmias - 02/2017 admit noted to have afib, aflutter, and MAT  - admit 09/17/28 with diastolic HF, afib with RVR - oral dilt added to his regimen during that admission.  - denies any palpitations. No bleeding issues on coumadin.    2. Chronic diastolic HF - 16/0109 echoLVEF 50-55%, restrictive diastolic dysfunction   - last visit increased lasix to 40mg  daily. - admit 09/04/17 with acute on chronic diastolic HF.  - issues with urinary retention, had foley placed to help with diuresis. Negative 5.5 liters, discharged on lasix 40mg  daily. Discharge weight 198 lbs - not checking weights. - compliant with meds.   - last visit we increased lasix to 60 mg alternating with 40mg  -. Ongoing SOB, abdominal distension. No significant weight change - limtiing sodium intake.  - had labs since last visit, results not available at this time.     Past Medical History:  Diagnosis Date  . Acute diastolic CHF (congestive heart failure) ([place]) 02/11/2017  . Atrial flutter ([place])    a. diagnosed in 02/2017. Rate-control strategy pursued.   . Hypertension   . Renal disorder    cyst on kidney     Allergies  Allergen Reactions  . Penicillins     Has patient had a PCN reaction causing immediate rash, facial/tongue/throat swelling, SOB or lightheadedness with hypotension: Yes Has patient had a PCN reaction causing severe rash involving mucus membranes or skin necrosis: No Has patient had a PCN reaction that required hospitalization: No Has patient had a PCN reaction occurring within the last 10 years: No If all of the above answers are "NO", then may proceed with Cephalosporin use.     Current Outpatient Medications  Medication Sig Dispense Refill  . diltiazem (CARDIZEM CD) 120 MG 24 hr capsule Take 1 capsule (120 mg  total) by mouth daily. 30 capsule 0  . furosemide (LASIX) 40 MG tablet TAKE 40 MG EVERY OTHER DAY ALTERNATING WITH 60 EVERY OTHER DAY 135 tablet 1  . losartan (COZAAR) 25 MG tablet Take 1 tablet (25 mg total) by mouth daily. 30 tablet 0  . metoprolol tartrate (LOPRESSOR) 100 MG tablet Take 100 mg by mouth 2 (two) times daily.    . polyethylene glycol powder (GLYCOLAX/MIRALAX) powder Take 17 g by mouth daily as needed.  0  . potassium chloride (K-DUR,KLOR-CON) 20 MEQ tablet Take 1 tablet (20 mEq total) by mouth daily. 30 tablet 0  . warfarin (COUMADIN) 5 MG tablet Take 1 tablet (5 mg total) by mouth daily. 45 tablet 3   No current facility-administered medications for this visit.      Past Surgical History:  Procedure Laterality Date  . ABCESS DRAINAGE  11/2008   BUTTOCKS  . CATARACT EXTRACTION, BILATERAL    . COLONOSCOPY N/A 04/02/2017   Procedure: COLONOSCOPY;  Surgeon: Rogene Houston, MD;  [facility];  Service: Endoscopy;  Laterality: N/A;  10:55  . POLYPECTOMY  04/02/2017   Procedure: POLYPECTOMY;  Surgeon: Rogene Houston, MD;  [facility];  Service: Endoscopy;;  asceding colon(hot snare)/ sigmoid colon times two (cold snare)     Allergies  Allergen Reactions  . Penicillins     Has patient had a PCN reaction causing immediate rash, facial/tongue/throat swelling, SOB or lightheadedness with hypotension: Yes Has patient had a PCN  reaction causing severe rash involving mucus membranes or skin necrosis: No Has patient had a PCN reaction that required hospitalization: No Has patient had a PCN reaction occurring within the last 10 years: No If all of the above answers are "NO", then may proceed with Cephalosporin use.      Family History  Problem Relation Age of Onset  . CVA Father   . CAD Father   . Diabetes Sister   . Colon cancer Neg Hx      Social History Michael Stephens reports that he quit smoking about 13 years ago. His smoking use included  cigarettes. He has never used smokeless tobacco. Michael Stephens reports that he does not drink alcohol.   Review of Systems CONSTITUTIONAL: No weight loss, fever, chills, weakness or fatigue.  HEENT: Eyes: No visual loss, blurred vision, double vision or yellow sclerae.No hearing loss, sneezing, congestion, runny nose or sore throat.  SKIN: No rash or itching.  CARDIOVASCULAR: per hpi RESPIRATORY: per hpi GASTROINTESTINAL: No anorexia, nausea, vomiting or diarrhea. No abdominal pain or blood.  GENITOURINARY: No burning on urination, no polyuria NEUROLOGICAL: No headache, dizziness, syncope, paralysis, ataxia, numbness or tingling in the extremities. No change in bowel or bladder control.  MUSCULOSKELETAL: No muscle, back pain, joint pain or stiffness.  LYMPHATICS: No enlarged nodes. No history of splenectomy.  PSYCHIATRIC: No history of depression or anxiety.  ENDOCRINOLOGIC: No reports of sweating, cold or heat intolerance. No polyuria or polydipsia.  Marland Kitchen   Physical Examination Vitals:   10/26/17 0949  BP: 100/66  Pulse: 96  SpO2: 94%   Vitals:   10/26/17 0949  Weight: 209 lb 9.6 oz (95.1 kg)  Height: 6' (1.829 m)    Gen: resting comfortably, no acute distress HEENT: no scleral icterus, pupils equal round and reactive, no palptable cervical adenopathy,  CV: irreg, no m/r/g, no jvd Resp: Clear to auscultation bilaterally GI: abdomen is soft, non-tender, non-distended, normal bowel sounds, no hepatosplenomegaly MSK: extremities are warm, trace bilateral edema Skin: warm, no rash Neuro:  no focal deficits Psych: appropriate affect   Diagnostic Studies 02/2017 nuclear stress  There was no ST segment deviation noted during stress.  Findings consistent with prior inferior/inferolateral myocardial infarction with mild to moderate peri-infarct ischemia.  This is a high risk study. High risk based on decreased LVEF. Based on prior infarct and current level of ischemia alone this  would sugest low to intermediate risk . Recommend correlate findings with echo  The left ventricular ejection fraction is moderately decreased (30-44%).  01/2017 echo Study Conclusions  - Left ventricle: The cavity size was normal. Wall thickness was increased in a pattern of mild LVH. Systolic function was normal. The estimated ejection fraction was in the range of 50% to 55%. Doppler parameters are consistent with restrictive physiology, indicative of decreased left ventricular diastolic compliance and/or increased left atrial pressure. Doppler parameters are consistent with high ventricular filling pressure. - Aortic valve: Mildly calcified annulus. Trileaflet; mildly thickened leaflets. There was very mild stenosis. Valve area (VTI): 1.59 cm^2. Valve area (Vmax): 1.73 cm^2. - Left atrium: The atrium was severely dilated. - Right atrium: The atrium was mildly dilated. - Atrial septum: No defect or patent foramen ovale was identified. - Inferior vena cava: The vessel was dilated. The respirophasic diameter changes were blunted (<50%), consistent with elevated central venous pressure. - Technically difficult study.         Assessment and Plan  1. Afib -no recent palpitations - ongoing SOB I think  more related to ongoing diastolic HF as opposed to arrythmias. If diuresis and ongoing symptoms may have to consider alternative strategy for his arrythmias. May consider home monitor to better evaluate rhythm and rates, he has had multiple prior atrial arrythmias before.  - continue coumadin.    2. Chronic diastolic HF -no imrpoved with recent lasix , will increase to 60mg  daily.  - he is to call us Friday and udpate [place] on weights -reqeust recent labs from [facility].   F/u 6 weeks     Arnoldo Lenis, M.D.

## 2017-10-29 ENCOUNTER — Telehealth: Payer: Self-pay | Admitting: *Deleted

## 2017-10-29 NOTE — Telephone Encounter (Signed)
Pt aware - routed to pcp  

## 2017-10-29 NOTE — Telephone Encounter (Signed)
-----   Message from Laurine Blazer, LPN sent at 04/27/6429  6:05 PM EDT -----   ----- Message ----- From: Arnoldo Lenis, MD Sent: 10/26/2017  12:55 PM To: Laurine Blazer, LPN  Labs look good   Zandra Abts MD

## 2017-11-02 ENCOUNTER — Telehealth: Payer: Self-pay | Admitting: Cardiology

## 2017-11-02 NOTE — Telephone Encounter (Signed)
Michael Stephens called stating that he needs to give weights.

## 2017-11-02 NOTE — Telephone Encounter (Signed)
Per 7/15 OV increase of lasix 60 mg daily - pt calling in weight from last week- says swelling in stomach and feet has gone dawn - still has some SOB but hasn't gotten any worse    7/15 - 203lbs  7/16 - 201lbs  7/17 - 202lbs 7/18 - 202lbs 7/19 - 202lbs 7/20 - 199lbs 7/21 - 200lbs 7/22 - 198lbs

## 2017-11-03 ENCOUNTER — Ambulatory Visit: Payer: Medicare Other | Admitting: Cardiology

## 2017-11-03 NOTE — Telephone Encounter (Signed)
Weights moving in the right direction, can he update [place] again on Monday with weights   Zandra Abts MD

## 2017-11-03 NOTE — Telephone Encounter (Signed)
Pt will update [place] weights on Monday

## 2017-11-06 ENCOUNTER — Other Ambulatory Visit (HOSPITAL_COMMUNITY)
Admission: RE | Admit: 2017-11-06 | Discharge: 2017-11-06 | Disposition: A | Discharge status: Home / Self Care | Payer: Medicare Other | Source: Other Acute Inpatient Hospital | Attending: Urology | Admitting: Urology

## 2017-11-06 ENCOUNTER — Ambulatory Visit (INDEPENDENT_AMBULATORY_CARE_PROVIDER_SITE_OTHER): Payer: Medicare Other | Admitting: Urology

## 2017-11-06 DIAGNOSIS — N401 Enlarged prostate with lower urinary tract symptoms: Secondary | ICD-10-CM | POA: Diagnosis not present

## 2017-11-06 DIAGNOSIS — N3000 Acute cystitis without hematuria: Secondary | ICD-10-CM | POA: Insufficient documentation

## 2017-11-06 DIAGNOSIS — R338 Other retention of urine: Secondary | ICD-10-CM | POA: Diagnosis not present

## 2017-11-06 LAB — URINALYSIS, COMPLETE (UACMP) WITH MICROSCOPIC
BACTERIA UA: NONE SEEN
Bilirubin Urine: NEGATIVE
Glucose, UA: NEGATIVE mg/dL
Ketones, ur: NEGATIVE mg/dL
NITRITE: POSITIVE — AB
PH: 6 (ref 5.0–8.0)
Protein, ur: NEGATIVE mg/dL
Specific Gravity, Urine: 1.01 (ref 1.005–1.030)
WBC, UA: >50 WBC/hpf — ABNORMAL HIGH (ref 0–5)

## 2017-11-07 LAB — URINE CULTURE

## 2017-11-10 ENCOUNTER — Ambulatory Visit (INDEPENDENT_AMBULATORY_CARE_PROVIDER_SITE_OTHER): Payer: Medicare Other | Admitting: *Deleted

## 2017-11-10 DIAGNOSIS — Z5181 Encounter for therapeutic drug level monitoring: Secondary | ICD-10-CM

## 2017-11-10 DIAGNOSIS — I4892 Unspecified atrial flutter: Secondary | ICD-10-CM | POA: Diagnosis not present

## 2017-11-10 LAB — POCT INR: INR: 1.5 — AB (ref 2.0–3.0)

## 2017-11-10 NOTE — Patient Instructions (Signed)
Take coumadin 1 1/2 tablets tonight then increase dose to 1 tablet daily except 1/2 tablet on Mondays, Wednesdays and Fridays.  Recheck in 2 weeks

## 2017-11-20 ENCOUNTER — Ambulatory Visit (INDEPENDENT_AMBULATORY_CARE_PROVIDER_SITE_OTHER): Payer: Medicare Other | Admitting: Urology

## 2017-11-20 DIAGNOSIS — R330 Drug induced retention of urine: Secondary | ICD-10-CM

## 2017-11-20 DIAGNOSIS — Z87440 Personal history of urinary (tract) infections: Secondary | ICD-10-CM | POA: Diagnosis not present

## 2017-11-20 DIAGNOSIS — N401 Enlarged prostate with lower urinary tract symptoms: Secondary | ICD-10-CM

## 2017-11-24 ENCOUNTER — Ambulatory Visit (INDEPENDENT_AMBULATORY_CARE_PROVIDER_SITE_OTHER): Payer: Medicare Other | Admitting: *Deleted

## 2017-11-24 DIAGNOSIS — I4892 Unspecified atrial flutter: Secondary | ICD-10-CM | POA: Diagnosis not present

## 2017-11-24 DIAGNOSIS — Z5181 Encounter for therapeutic drug level monitoring: Secondary | ICD-10-CM

## 2017-11-24 LAB — POCT INR: INR: 1.8 — AB (ref 2.0–3.0)

## 2017-11-24 NOTE — Patient Instructions (Signed)
Increase coumadin to  1 tablet daily except 1/2 tablet on Mondays and Fridays.  Recheck in 3 weeks

## 2017-12-15 ENCOUNTER — Ambulatory Visit (INDEPENDENT_AMBULATORY_CARE_PROVIDER_SITE_OTHER): Payer: Medicare Other | Admitting: *Deleted

## 2017-12-15 ENCOUNTER — Encounter: Payer: Self-pay | Admitting: Cardiology

## 2017-12-15 ENCOUNTER — Ambulatory Visit (INDEPENDENT_AMBULATORY_CARE_PROVIDER_SITE_OTHER): Payer: Medicare Other | Admitting: Cardiology

## 2017-12-15 VITALS — BP 116/76 | HR 94 | Ht 72.0 in | Wt 207.0 lb

## 2017-12-15 DIAGNOSIS — R0602 Shortness of breath: Secondary | ICD-10-CM | POA: Diagnosis not present

## 2017-12-15 DIAGNOSIS — I5032 Chronic diastolic (congestive) heart failure: Secondary | ICD-10-CM

## 2017-12-15 DIAGNOSIS — I4892 Unspecified atrial flutter: Secondary | ICD-10-CM | POA: Diagnosis not present

## 2017-12-15 DIAGNOSIS — I4891 Unspecified atrial fibrillation: Secondary | ICD-10-CM | POA: Diagnosis not present

## 2017-12-15 DIAGNOSIS — Z5181 Encounter for therapeutic drug level monitoring: Secondary | ICD-10-CM | POA: Diagnosis not present

## 2017-12-15 LAB — POCT INR: INR: 2.6 (ref 2.0–3.0)

## 2017-12-15 MED ORDER — FUROSEMIDE 80 MG PO TABS: 80.0000 mg | ORAL_TABLET | Freq: Every day | ORAL | 1 refills | Status: DC

## 2017-12-15 NOTE — Patient Instructions (Signed)
Continue coumadin 1 tablet daily except 1/2 tablet on Mondays and Fridays Recheck in 4 weeks  

## 2017-12-15 NOTE — Patient Instructions (Addendum)
Your physician recommends that you schedule a follow-up appointment in: 2 [place] has recommended you make the following change in your medication:   INCREASE LASIX 36 MG DAILY  Your physician recommends that you return for lab work in: [place] BMP/MG  Your physician has recommended that you have a pulmonary function test. Pulmonary Function Tests are a group of tests that measure how well air moves in and out of your lungs.  Your physician has recommended that you wear an event monitor FOR 1 WEEK. Event monitors are medical devices that record the heart's electrical activity. Doctors most often [place] these monitors to diagnose arrhythmias. Arrhythmias are problems with the speed or rhythm of the heartbeat. The monitor is a small, portable device. You can wear one while you do your normal daily activities. This is usually used to diagnose what is causing palpitations/syncope (passing out).    Thank you for choosing [facility]!!

## 2017-12-15 NOTE — Progress Notes (Signed)
Clinical Summary Michael Stephens is a 83 y.o.male seen today for follow up of the following medical problems.This is a focused visit on recent SOB.   1. Atrial arrhythmias - 02/2017 admit noted to have afib, aflutter, and MAT  - admit 3/32/95 with diastolic HF, afib with RVR - oral dilt added to his regimen during that admission.  - no recent palpitations. Unclear if his exertional SOB could be arrhythmia related.    2. Chronic diastolic HF - 18/8416 echoLVEF 50-55%, restrictive diastolic dysfunction   - last visit increased lasix to 40mg  daily. - admit 09/04/17 with acute on chronic diastolic HF.  - issues with urinary retention, had foley placed to help with diuresis. Negative 5.5 liters, discharged on lasix 40mg  daily. Discharge weight 198 lbs   - last visit we increased lasis to 60mg  daily due to volume overload - weights trended down from 2013 to 198 lbs.  - home weights 198 to 203 lbs.  - ongoing SOB    Past Medical History:  Diagnosis Date  . Acute diastolic CHF (congestive heart failure) ([place]) 02/11/2017  . Atrial flutter ([place])    a. diagnosed in 02/2017. Rate-control strategy pursued.   . Hypertension   . Renal disorder    cyst on kidney     Allergies  Allergen Reactions  . Penicillins     Has patient had a PCN reaction causing immediate rash, facial/tongue/throat swelling, SOB or lightheadedness with hypotension: Yes Has patient had a PCN reaction causing severe rash involving mucus membranes or skin necrosis: No Has patient had a PCN reaction that required hospitalization: No Has patient had a PCN reaction occurring within the last 10 years: No If all of the above answers are "NO", then may proceed with Cephalosporin use.     Current Outpatient Medications  Medication Sig Dispense Refill  . diltiazem (CARDIZEM CD) 120 MG 24 hr capsule Take 1 capsule (120 mg total) by mouth daily. 30 capsule 0  . finasteride (PROSCAR) 5 MG tablet Take 5 mg by  mouth daily.    . furosemide (LASIX) 40 MG tablet Take 1.5 tablets (60 mg total) by mouth daily.    Marland Kitchen losartan (COZAAR) 25 MG tablet Take 1 tablet (25 mg total) by mouth daily. 30 tablet 0  . metoprolol tartrate (LOPRESSOR) 100 MG tablet Take 100 mg by mouth 2 (two) times daily.    . pantoprazole (PROTONIX) 40 MG tablet Take 40 mg by mouth daily.  2  . polyethylene glycol powder (GLYCOLAX/MIRALAX) powder Take 17 g by mouth daily as needed.  0  . potassium chloride (K-DUR,KLOR-CON) 20 MEQ tablet Take 1 tablet (20 mEq total) by mouth daily. 30 tablet 0  . tamsulosin (FLOMAX) 0.4 MG CAPS capsule Take 0.4 mg by mouth.    . warfarin (COUMADIN) 5 MG tablet Take 1 tablet (5 mg total) by mouth daily. 45 tablet 3   No current facility-administered medications for this visit.      Past Surgical History:  Procedure Laterality Date  . ABCESS DRAINAGE  11/2008   BUTTOCKS  . CATARACT EXTRACTION, BILATERAL    . COLONOSCOPY N/A 04/02/2017   Procedure: COLONOSCOPY;  Surgeon: Rogene Houston, MD;  [facility];  Service: Endoscopy;  Laterality: N/A;  10:55  . POLYPECTOMY  04/02/2017   Procedure: POLYPECTOMY;  Surgeon: Rogene Houston, MD;  [facility]: [facility];  Service: Endoscopy;;  asceding colon(hot snare)/ sigmoid colon times two (cold snare)     Allergies  Allergen Reactions  . Penicillins     Has patient had a PCN reaction causing immediate rash, facial/tongue/throat swelling, SOB or lightheadedness with hypotension: Yes Has patient had a PCN reaction causing severe rash involving mucus membranes or skin necrosis: No Has patient had a PCN reaction that required hospitalization: No Has patient had a PCN reaction occurring within the last 10 years: No If all of the above answers are "NO", then may proceed with Cephalosporin use.      Family History  Problem Relation Age of Onset  . CVA Father   . CAD Father   . Diabetes Sister   . Colon cancer Neg Hx      Social  History Michael Stephens reports that he quit smoking about 13 years ago. His smoking use included cigarettes. He has never used smokeless tobacco. Michael Stephens reports that he does not drink alcohol.   Review of Systems CONSTITUTIONAL: No weight loss, fever, chills, weakness or fatigue.  HEENT: Eyes: No visual loss, blurred vision, double vision or yellow sclerae.No hearing loss, sneezing, congestion, runny nose or sore throat.  SKIN: No rash or itching.  CARDIOVASCULAR: per hpi RESPIRATORY: per hpi GASTROINTESTINAL: No anorexia, nausea, vomiting or diarrhea. No abdominal pain or blood.  GENITOURINARY: No burning on urination, no polyuria NEUROLOGICAL: No headache, dizziness, syncope, paralysis, ataxia, numbness or tingling in the extremities. No change in bowel or bladder control.  MUSCULOSKELETAL: No muscle, back pain, joint pain or stiffness.  LYMPHATICS: No enlarged nodes. No history of splenectomy.  PSYCHIATRIC: No history of depression or anxiety.  ENDOCRINOLOGIC: No reports of sweating, cold or heat intolerance. No polyuria or polydipsia.  Marland Kitchen   Physical Examination Vitals:   12/15/17 1404  BP: 116/76  Pulse: 94  SpO2: 95%   Filed Weights   12/15/17 1404  Weight: 207 lb (93.9 kg)    Gen: resting comfortably, no acute distress HEENT: no scleral icterus, pupils equal round and reactive, no palptable cervical adenopathy,  CV: irreg, no m/r/g, no jvd Resp: Clear to auscultation bilaterally GI: abdomen is soft, non-tender, non-distended, normal bowel sounds, no hepatosplenomegaly MSK: extremities are warm, no edema.  Skin: warm, no rash Neuro:  no focal deficits Psych: appropriate affect   Diagnostic Studies 02/2017 nuclear stress  There was no ST segment deviation noted during stress.  Findings consistent with prior inferior/inferolateral myocardial infarction with mild to moderate peri-infarct ischemia.  This is a high risk study. High risk based on decreased LVEF. Based  on prior infarct and current level of ischemia alone this would sugest low to intermediate risk . Recommend correlate findings with echo  The left ventricular ejection fraction is moderately decreased (30-44%).  01/2017 echo Study Conclusions  - Left ventricle: The cavity size was normal. Wall thickness was increased in a pattern of mild LVH. Systolic function was normal. The estimated ejection fraction was in the range of 50% to 55%. Doppler parameters are consistent with restrictive physiology, indicative of decreased left ventricular diastolic compliance and/or increased left atrial pressure. Doppler parameters are consistent with high ventricular filling pressure. - Aortic valve: Mildly calcified annulus. Trileaflet; mildly thickened leaflets. There was very mild stenosis. Valve area (VTI): 1.59 cm^2. Valve area (Vmax): 1.73 cm^2. - Left atrium: The atrium was severely dilated. - Right atrium: The atrium was mildly dilated. - Atrial septum: No defect or patent foramen ovale was identified. - Inferior vena cava: The vessel was dilated. The respirophasic diameter changes were blunted (<50%), consistent with elevated central venous pressure. -  Technically difficult study.      Assessment and Plan  1. Afib -no palpitations, unclear if arrhythmia could be playing a role in his DOE - EKG today shows afib rate 90. Will plan for 7 day event monitor to evaluate for tachycarrhythmias, he has had multiple atrial arrhytmias in the past   2. Chronic diastolic HF -weights trending down, still with some SOB perhaps some lingering fluid overload - increase lasix to 80mg  daily, check BMET/Mg in 2 weeks   3. SOB - cardiac workup as mentioned above. Also with significant smoking history, we will plan for PFTs in setting of unclear etiology for his ongoing SOB/DOE         Arnoldo Lenis, M.D.

## 2017-12-23 DIAGNOSIS — R002 Palpitations: Secondary | ICD-10-CM | POA: Diagnosis not present

## 2017-12-24 ENCOUNTER — Ambulatory Visit (HOSPITAL_COMMUNITY)
Admission: RE | Admit: 2017-12-24 | Discharge: 2017-12-24 | Disposition: A | Discharge status: Home / Self Care | Payer: Medicare Other | Source: Ambulatory Visit | Attending: Cardiology | Admitting: Cardiology

## 2017-12-24 DIAGNOSIS — R0602 Shortness of breath: Secondary | ICD-10-CM

## 2017-12-24 MED ORDER — ALBUTEROL SULFATE (2.5 MG/3ML) 0.083% IN NEBU: 2.5000 mg | INHALATION_SOLUTION | Freq: Once | RESPIRATORY_TRACT | Status: DC

## 2018-01-06 ENCOUNTER — Encounter (HOSPITAL_COMMUNITY): Payer: Medicare Other

## 2018-01-06 ENCOUNTER — Ambulatory Visit (HOSPITAL_COMMUNITY)
Admission: RE | Admit: 2018-01-06 | Discharge: 2018-01-06 | Disposition: A | Discharge status: Home / Self Care | Payer: Medicare Other | Source: Ambulatory Visit | Attending: Cardiology | Admitting: Cardiology

## 2018-01-06 DIAGNOSIS — Z79899 Other long term (current) drug therapy: Secondary | ICD-10-CM | POA: Insufficient documentation

## 2018-01-06 DIAGNOSIS — R0602 Shortness of breath: Secondary | ICD-10-CM | POA: Insufficient documentation

## 2018-01-06 DIAGNOSIS — R918 Other nonspecific abnormal finding of lung field: Secondary | ICD-10-CM | POA: Insufficient documentation

## 2018-01-06 DIAGNOSIS — Z87891 Personal history of nicotine dependence: Secondary | ICD-10-CM | POA: Diagnosis not present

## 2018-01-06 LAB — PULMONARY FUNCTION TEST
DL/VA % pred: 66 %
DL/VA: 3.14 ml/min/mmHg/L
DLCO UNC % PRED: 38 %
DLCO UNC: 13.57 ml/min/mmHg
FEF 25-75 PRE: 1.38 L/s
FEF 25-75 Post: 1.93 L/sec
FEF2575-%CHANGE-POST: 39 %
FEF2575-%Pred-Post: 92 %
FEF2575-%Pred-Pre: 66 %
FEV1-%CHANGE-POST: 9 %
FEV1-%PRED-POST: 63 %
FEV1-%Pred-Pre: 58 %
FEV1-PRE: 1.78 L
FEV1-Post: 1.95 L
FEV1FVC-%Change-Post: 3 %
FEV1FVC-%Pred-Pre: 101 %
FEV6-%CHANGE-POST: 8 %
FEV6-%PRED-POST: 65 %
FEV6-%PRED-PRE: 60 %
FEV6-POST: 2.61 L
FEV6-Pre: 2.42 L
FEV6FVC-%CHANGE-POST: 0 %
FEV6FVC-%PRED-POST: 106 %
FEV6FVC-%PRED-PRE: 107 %
FVC-%Change-Post: 6 %
FVC-%Pred-Post: 61 %
FVC-%Pred-Pre: 57 %
FVC-Post: 2.63 L
POST FEV6/FVC RATIO: 99 %
Post FEV1/FVC ratio: 74 %
Pre FEV1/FVC ratio: 72 %
Pre FEV6/FVC Ratio: 100 %
RV % PRED: 118 %
RV: 3.32 L
TLC % pred: 78 %
TLC: 5.83 L

## 2018-01-06 MED ORDER — ALBUTEROL SULFATE (2.5 MG/3ML) 0.083% IN NEBU
2.5000 mg | INHALATION_SOLUTION | Freq: Once | RESPIRATORY_TRACT | Status: AC
  Administered 2018-01-06: 2.5 mg via RESPIRATORY_TRACT

## 2018-01-08 ENCOUNTER — Telehealth: Payer: Self-pay | Admitting: *Deleted

## 2018-01-08 NOTE — Telephone Encounter (Signed)
Pt wife (DPR Joaquim Lai) made aware - says they do not have good phone reception where they live and didn't think the monitor ever worked correctly - routed to [employer]

## 2018-01-08 NOTE — Telephone Encounter (Signed)
-----   Message from Arnoldo Lenis, MD sent at 01/07/2018 12:30 PM EDT ----- Limited information from the heart monitor, did he have troubles wearing it? From the available information his heart rates look fine   Zandra Abts MD

## 2018-01-12 ENCOUNTER — Ambulatory Visit (INDEPENDENT_AMBULATORY_CARE_PROVIDER_SITE_OTHER): Payer: Medicare Other | Admitting: *Deleted

## 2018-01-12 DIAGNOSIS — I4892 Unspecified atrial flutter: Secondary | ICD-10-CM

## 2018-01-12 DIAGNOSIS — Z5181 Encounter for therapeutic drug level monitoring: Secondary | ICD-10-CM

## 2018-01-12 LAB — POCT INR: INR: 2.9 (ref 2.0–3.0)

## 2018-01-12 NOTE — Patient Instructions (Signed)
Continue coumadin 1 tablet daily except 1/2 tablet on Mondays and Fridays Recheck in 4 weeks  

## 2018-01-14 ENCOUNTER — Telehealth: Payer: Self-pay | Admitting: *Deleted

## 2018-01-14 DIAGNOSIS — R942 Abnormal results of pulmonary function studies: Secondary | ICD-10-CM

## 2018-01-14 NOTE — Telephone Encounter (Signed)
-----   Message from Arnoldo Lenis, MD sent at 01/13/2018  2:38 PM EDT ----- Breathing test is abnormal suggesting he may have some COPD, can we refer to Dr Arlester Marker MD

## 2018-01-14 NOTE — Telephone Encounter (Signed)
Pt agreeable to referral - orders placed and will forward to schedulers - routed to pcp

## 2018-02-01 ENCOUNTER — Other Ambulatory Visit: Payer: Self-pay | Admitting: *Deleted

## 2018-02-01 MED ORDER — METOPROLOL TARTRATE 100 MG PO TABS: 100.0000 mg | ORAL_TABLET | Freq: Two times a day (BID) | ORAL | 2 refills | Status: DC

## 2018-02-11 ENCOUNTER — Ambulatory Visit (INDEPENDENT_AMBULATORY_CARE_PROVIDER_SITE_OTHER): Payer: Medicare Other | Admitting: *Deleted

## 2018-02-11 DIAGNOSIS — Z5181 Encounter for therapeutic drug level monitoring: Secondary | ICD-10-CM

## 2018-02-11 DIAGNOSIS — I4892 Unspecified atrial flutter: Secondary | ICD-10-CM | POA: Diagnosis not present

## 2018-02-11 LAB — POCT INR: INR: 2.4 (ref 2.0–3.0)

## 2018-02-11 NOTE — Patient Instructions (Signed)
Continue coumadin 1 tablet daily except 1/2 tablet on Mondays and Fridays Recheck in 4 weeks  

## 2018-02-15 DIAGNOSIS — M1711 Unilateral primary osteoarthritis, right knee: Secondary | ICD-10-CM | POA: Diagnosis not present

## 2018-02-15 DIAGNOSIS — M1712 Unilateral primary osteoarthritis, left knee: Secondary | ICD-10-CM | POA: Diagnosis not present

## 2018-02-15 DIAGNOSIS — I4891 Unspecified atrial fibrillation: Secondary | ICD-10-CM | POA: Diagnosis not present

## 2018-02-15 DIAGNOSIS — Z6829 Body mass index (BMI) 29.0-29.9, adult: Secondary | ICD-10-CM | POA: Diagnosis not present

## 2018-02-15 DIAGNOSIS — Z23 Encounter for immunization: Secondary | ICD-10-CM | POA: Diagnosis not present

## 2018-02-15 DIAGNOSIS — I509 Heart failure, unspecified: Secondary | ICD-10-CM | POA: Diagnosis not present

## 2018-02-15 DIAGNOSIS — I4892 Unspecified atrial flutter: Secondary | ICD-10-CM | POA: Diagnosis not present

## 2018-02-15 DIAGNOSIS — Z6830 Body mass index (BMI) 30.0-30.9, adult: Secondary | ICD-10-CM | POA: Diagnosis not present

## 2018-02-18 ENCOUNTER — Encounter: Payer: Self-pay | Admitting: *Deleted

## 2018-02-19 ENCOUNTER — Encounter: Payer: Self-pay | Admitting: Cardiology

## 2018-02-19 ENCOUNTER — Ambulatory Visit (INDEPENDENT_AMBULATORY_CARE_PROVIDER_SITE_OTHER): Payer: Medicare Other | Admitting: Cardiology

## 2018-02-19 VITALS — BP 134/89 | HR 83 | Ht 72.0 in | Wt 216.8 lb

## 2018-02-19 DIAGNOSIS — I4891 Unspecified atrial fibrillation: Secondary | ICD-10-CM

## 2018-02-19 DIAGNOSIS — R0602 Shortness of breath: Secondary | ICD-10-CM

## 2018-02-19 DIAGNOSIS — E782 Mixed hyperlipidemia: Secondary | ICD-10-CM

## 2018-02-19 DIAGNOSIS — I5032 Chronic diastolic (congestive) heart failure: Secondary | ICD-10-CM

## 2018-02-19 NOTE — Progress Notes (Signed)
Clinical Summary Mr. Amero is a 83 y.o.male seen today for follow up of the following medical problems.   1. Atrial arrhythmias - 02/2017 admit noted to have afib, aflutter, and MAT  - admit 2/58/52 with diastolic HF, afib with RVR - oral dilt added to his regimen during that admission.   12/2017 event monitor technically difficult due to poor reception at home, only 12% of data available. Showed afib with normal rates. - no recent palpitations.   2. Chronic diastolic HF - 77/8242 echoLVEF 50-55%, restrictive diastolic dysfunction    - we increased lasix to 80mg  daily last visit. - weight up since last visit, he reports gradual gain due to poor dietary habits, Has not had any increase in edema, chronic SOB unchanged.     3. COPD - abnoraml PFTs 12/2017, we referred to Dr Luan Pulling - trying symbicort by pcp, just started 2 days ago.    4. Abnormal stress test - nuclear stress with/interolateral infarct with mild to moderate peri-infarct ischemia. - no recent ches tpain.    Past Medical History:  Diagnosis Date  . Acute diastolic CHF (congestive heart failure) ([place]) 02/11/2017  . Atrial flutter ([place])    a. diagnosed in 02/2017. Rate-control strategy pursued.   . Hypertension   . Renal disorder    cyst on kidney     Allergies  Allergen Reactions  . Penicillins     Has patient had a PCN reaction causing immediate rash, facial/tongue/throat swelling, SOB or lightheadedness with hypotension: Yes Has patient had a PCN reaction causing severe rash involving mucus membranes or skin necrosis: No Has patient had a PCN reaction that required hospitalization: No Has patient had a PCN reaction occurring within the last 10 years: No If all of the above answers are "NO", then may proceed with Cephalosporin use.     Current Outpatient Medications  Medication Sig Dispense Refill  . diltiazem (CARDIZEM CD) 120 MG 24 hr capsule Take 1 capsule (120 mg total) by  mouth daily. 30 capsule 0  . finasteride (PROSCAR) 5 MG tablet Take 5 mg by mouth daily.    . furosemide (LASIX) 80 MG tablet Take 1 tablet (80 mg total) by mouth daily. 90 tablet 1  . losartan (COZAAR) 25 MG tablet Take 1 tablet (25 mg total) by mouth daily. 30 tablet 0  . metoprolol tartrate (LOPRESSOR) 100 MG tablet Take 1 tablet (100 mg total) by mouth 2 (two) times daily. 180 tablet 2  . pantoprazole (PROTONIX) 40 MG tablet Take 40 mg by mouth daily.  2  . polyethylene glycol powder (GLYCOLAX/MIRALAX) powder Take 17 g by mouth daily as needed.  0  . potassium chloride (K-DUR,KLOR-CON) 20 MEQ tablet Take 1 tablet (20 mEq total) by mouth daily. 30 tablet 0  . tamsulosin (FLOMAX) 0.4 MG CAPS capsule Take 0.4 mg by mouth.    . warfarin (COUMADIN) 5 MG tablet Take 1 tablet (5 mg total) by mouth daily. 45 tablet 3   No current facility-administered medications for this visit.      Past Surgical History:  Procedure Laterality Date  . ABCESS DRAINAGE  11/2008   BUTTOCKS  . CATARACT EXTRACTION, BILATERAL    . COLONOSCOPY N/A 04/02/2017   Procedure: COLONOSCOPY;  Surgeon: Rogene Houston, MD;  [facility];  Service: Endoscopy;  Laterality: N/A;  10:55  . POLYPECTOMY  04/02/2017   Procedure: POLYPECTOMY;  Surgeon: Rogene Houston, MD;  [facility];  Service: Endoscopy;;  asceding colon(hot snare)/ sigmoid colon times two (cold snare)     Allergies  Allergen Reactions  . Penicillins     Has patient had a PCN reaction causing immediate rash, facial/tongue/throat swelling, SOB or lightheadedness with hypotension: Yes Has patient had a PCN reaction causing severe rash involving mucus membranes or skin necrosis: No Has patient had a PCN reaction that required hospitalization: No Has patient had a PCN reaction occurring within the last 10 years: No If all of the above answers are "NO", then may proceed with Cephalosporin use.      Family History  Problem Relation  Age of Onset  . CVA Father   . CAD Father   . Diabetes Sister   . Colon cancer Neg Hx      Social History Mr. Meddaugh reports that he quit smoking about 13 years ago. His smoking use included cigarettes. He has never used smokeless tobacco. Mr. Maclaren reports that he does not drink alcohol.   Review of Systems CONSTITUTIONAL: No weight loss, fever, chills, weakness or fatigue.  HEENT: Eyes: No visual loss, blurred vision, double vision or yellow sclerae.No hearing loss, sneezing, congestion, runny nose or sore throat.  SKIN: No rash or itching.  CARDIOVASCULAR: per hpi RESPIRATORY: per hpi.  GASTROINTESTINAL: No anorexia, nausea, vomiting or diarrhea. No abdominal pain or blood.  GENITOURINARY: No burning on urination, no polyuria NEUROLOGICAL: No headache, dizziness, syncope, paralysis, ataxia, numbness or tingling in the extremities. No change in bowel or bladder control.  MUSCULOSKELETAL: No muscle, back pain, joint pain or stiffness.  LYMPHATICS: No enlarged nodes. No history of splenectomy.  PSYCHIATRIC: No history of depression or anxiety.  ENDOCRINOLOGIC: No reports of sweating, cold or heat intolerance. No polyuria or polydipsia.  Marland Kitchen   Physical Examination Vitals:   02/19/18 1054  BP: 134/89  Pulse: 83  SpO2: 96%   Vitals:   02/19/18 1054  Weight: 216 lb 12.8 oz (98.3 kg)  Height: 6' (1.829 m)    Gen: resting comfortably, no acute distress HEENT: no scleral icterus, pupils equal round and reactive, no palptable cervical adenopathy,  CV: RRR, no mrg, no jvd Resp: Clear to auscultation bilaterally GI: abdomen is soft, non-tender, non-distended, normal bowel sounds, no hepatosplenomegaly MSK: extremities are warm, no edema.  Skin: warm, no rash Neuro:  no focal deficits Psych: appropriate affect   Diagnostic Studies 02/2017 nuclear stress  There was no ST segment deviation noted during stress.  Findings consistent with prior inferior/inferolateral  myocardial infarction with mild to moderate peri-infarct ischemia.  This is a high risk study. High risk based on decreased LVEF. Based on prior infarct and current level of ischemia alone this would sugest low to intermediate risk . Recommend correlate findings with echo  The left ventricular ejection fraction is moderately decreased (30-44%).  01/2017 echo Study Conclusions  - Left ventricle: The cavity size was normal. Wall thickness was increased in a pattern of mild LVH. Systolic function was normal. The estimated ejection fraction was in the range of 50% to 55%. Doppler parameters are consistent with restrictive physiology, indicative of decreased left ventricular diastolic compliance and/or increased left atrial pressure. Doppler parameters are consistent with high ventricular filling pressure. - Aortic valve: Mildly calcified annulus. Trileaflet; mildly thickened leaflets. There was very mild stenosis. Valve area (VTI): 1.59 cm^2. Valve area (Vmax): 1.73 cm^2. - Left atrium: The atrium was severely dilated. - Right atrium: The atrium was mildly dilated. - Atrial septum: No defect or patent foramen ovale was identified. -  Inferior vena cava: The vessel was dilated. The respirophasic diameter changes were blunted (<50%), consistent with elevated central venous pressure. - Technically difficult study.   12/2017 PFTs: +COPD   \12/2017 event monitor  7 day event monitor. Data available only for 12% of planned time  Min HR 67, Max HR 120, Avg HR 87  No symptoms reported  Available telemetry tracings show afib with normal rates.  Assessment and Plan   1. Afib -no recent symptoms, continue current meds   2. Chronic diastolic HF - uptrend in weight however does not have significant fluid by exam. He reports gradual weight gain due to poor dietary habits - continue current meds   3. SOB - abnormal PFTs, awaiting appt with Dr Luan Pulling -  started on symbicort rececntly by pcp   4. Abnormal stress test -no recent symptoms, we will continue to monitor.    5. Hyperlipidemia - muscle aches on pravastatin, we have stopped   Arnoldo Lenis, M.D.

## 2018-02-19 NOTE — Patient Instructions (Signed)
Your physician recommends that you schedule a follow-up appointment in: 4 MONTHS WITH DR BRANCH  Your physician recommends that you continue on your current medications as directed. Please refer to the Current Medication list given to you today.  Thank you for choosing [facility]!!    

## 2018-02-22 ENCOUNTER — Encounter: Payer: Self-pay | Admitting: Cardiology

## 2018-02-26 ENCOUNTER — Ambulatory Visit (INDEPENDENT_AMBULATORY_CARE_PROVIDER_SITE_OTHER): Payer: Medicare Other | Admitting: Urology

## 2018-02-26 DIAGNOSIS — R3914 Feeling of incomplete bladder emptying: Secondary | ICD-10-CM | POA: Diagnosis not present

## 2018-02-26 DIAGNOSIS — Z87440 Personal history of urinary (tract) infections: Secondary | ICD-10-CM

## 2018-02-26 DIAGNOSIS — N401 Enlarged prostate with lower urinary tract symptoms: Secondary | ICD-10-CM

## 2018-03-06 ENCOUNTER — Other Ambulatory Visit: Payer: Self-pay | Admitting: Cardiology

## 2018-03-19 ENCOUNTER — Ambulatory Visit (INDEPENDENT_AMBULATORY_CARE_PROVIDER_SITE_OTHER): Payer: Medicare Other | Admitting: *Deleted

## 2018-03-19 DIAGNOSIS — Z5181 Encounter for therapeutic drug level monitoring: Secondary | ICD-10-CM | POA: Diagnosis not present

## 2018-03-19 DIAGNOSIS — I4892 Unspecified atrial flutter: Secondary | ICD-10-CM

## 2018-03-19 LAB — POCT INR: INR: 2.2 (ref 2.0–3.0)

## 2018-03-19 NOTE — Patient Instructions (Signed)
Continue coumadin 1 tablet daily except 1/2 tablet on Mondays and Fridays Recheck in 6 weeks 

## 2018-03-29 ENCOUNTER — Encounter (HOSPITAL_COMMUNITY): Payer: Self-pay | Admitting: Emergency Medicine

## 2018-03-29 ENCOUNTER — Emergency Department (HOSPITAL_COMMUNITY)
Admission: EM | Admit: 2018-03-29 | Discharge: 2018-03-29 | Disposition: A | Discharge status: Home / Self Care | Payer: Medicare Other | Source: Home / Self Care | Attending: Emergency Medicine | Admitting: Emergency Medicine

## 2018-03-29 ENCOUNTER — Other Ambulatory Visit: Payer: Self-pay

## 2018-03-29 ENCOUNTER — Emergency Department (HOSPITAL_COMMUNITY): Payer: Medicare Other

## 2018-03-29 DIAGNOSIS — R0602 Shortness of breath: Secondary | ICD-10-CM | POA: Insufficient documentation

## 2018-03-29 DIAGNOSIS — R1084 Generalized abdominal pain: Secondary | ICD-10-CM

## 2018-03-29 DIAGNOSIS — I4892 Unspecified atrial flutter: Secondary | ICD-10-CM | POA: Insufficient documentation

## 2018-03-29 DIAGNOSIS — I5032 Chronic diastolic (congestive) heart failure: Secondary | ICD-10-CM | POA: Insufficient documentation

## 2018-03-29 DIAGNOSIS — I5020 Unspecified systolic (congestive) heart failure: Secondary | ICD-10-CM | POA: Diagnosis not present

## 2018-03-29 DIAGNOSIS — Z87891 Personal history of nicotine dependence: Secondary | ICD-10-CM | POA: Insufficient documentation

## 2018-03-29 DIAGNOSIS — R109 Unspecified abdominal pain: Secondary | ICD-10-CM | POA: Diagnosis not present

## 2018-03-29 DIAGNOSIS — I110 Hypertensive heart disease with heart failure: Secondary | ICD-10-CM | POA: Insufficient documentation

## 2018-03-29 DIAGNOSIS — R110 Nausea: Secondary | ICD-10-CM | POA: Diagnosis not present

## 2018-03-29 DIAGNOSIS — R059 Cough, unspecified: Secondary | ICD-10-CM

## 2018-03-29 DIAGNOSIS — R05 Cough: Secondary | ICD-10-CM

## 2018-03-29 DIAGNOSIS — I480 Paroxysmal atrial fibrillation: Secondary | ICD-10-CM | POA: Diagnosis not present

## 2018-03-29 DIAGNOSIS — J9601 Acute respiratory failure with hypoxia: Secondary | ICD-10-CM | POA: Diagnosis not present

## 2018-03-29 DIAGNOSIS — N400 Enlarged prostate without lower urinary tract symptoms: Secondary | ICD-10-CM | POA: Diagnosis not present

## 2018-03-29 DIAGNOSIS — I5033 Acute on chronic diastolic (congestive) heart failure: Secondary | ICD-10-CM | POA: Diagnosis not present

## 2018-03-29 DIAGNOSIS — J9811 Atelectasis: Secondary | ICD-10-CM | POA: Diagnosis not present

## 2018-03-29 LAB — BRAIN NATRIURETIC PEPTIDE: B Natriuretic Peptide: 105 pg/mL — ABNORMAL HIGH (ref 0.0–100.0)

## 2018-03-29 LAB — CBC WITH DIFFERENTIAL/PLATELET
Abs Immature Granulocytes: 0.03 10*3/uL (ref 0.00–0.07)
Basophils Absolute: 0 10*3/uL (ref 0.0–0.1)
Basophils Relative: 0 %
Eosinophils Absolute: 0.2 10*3/uL (ref 0.0–0.5)
Eosinophils Relative: 2 %
HEMATOCRIT: 30.1 % — AB (ref 39.0–52.0)
Hemoglobin: 9.1 g/dL — ABNORMAL LOW (ref 13.0–17.0)
Immature Granulocytes: 0 %
Lymphocytes Relative: 15 %
Lymphs Abs: 1.3 10*3/uL (ref 0.7–4.0)
MCH: 30.5 pg (ref 26.0–34.0)
MCHC: 30.2 g/dL (ref 30.0–36.0)
MCV: 101 fL — ABNORMAL HIGH (ref 80.0–100.0)
MONO ABS: 1.2 10*3/uL — AB (ref 0.1–1.0)
Monocytes Relative: 14 %
Neutro Abs: 5.9 10*3/uL (ref 1.7–7.7)
Neutrophils Relative %: 69 %
Platelets: 259 10*3/uL (ref 150–400)
RBC: 2.98 MIL/uL — ABNORMAL LOW (ref 4.22–5.81)
RDW: 15.9 % — ABNORMAL HIGH (ref 11.5–15.5)
WBC: 8.6 10*3/uL (ref 4.0–10.5)
nRBC: 0 % (ref 0.0–0.2)

## 2018-03-29 LAB — COMPREHENSIVE METABOLIC PANEL
ALT: 39 U/L (ref 0–44)
AST: 30 U/L (ref 15–41)
Albumin: 3.9 g/dL (ref 3.5–5.0)
Alkaline Phosphatase: 57 U/L (ref 38–126)
Anion gap: 7 (ref 5–15)
BILIRUBIN TOTAL: 0.9 mg/dL (ref 0.3–1.2)
BUN: 35 mg/dL — ABNORMAL HIGH (ref 8–23)
CALCIUM: 9.7 mg/dL (ref 8.9–10.3)
CO2: 28 mmol/L (ref 22–32)
CREATININE: 1.02 mg/dL (ref 0.61–1.24)
Chloride: 104 mmol/L (ref 98–111)
GFR calc Af Amer: >60 mL/min (ref >60)
GFR calc non Af Amer: >60 mL/min (ref >60)
Glucose, Bld: 99 mg/dL (ref 70–99)
Potassium: 4.2 mmol/L (ref 3.5–5.1)
Sodium: 139 mmol/L (ref 135–145)
TOTAL PROTEIN: 6.6 g/dL (ref 6.5–8.1)

## 2018-03-29 LAB — PROTIME-INR
INR: 1.54
Prothrombin Time: 18.3 seconds — ABNORMAL HIGH (ref 11.4–15.2)

## 2018-03-29 LAB — LIPASE, BLOOD: LIPASE: 26 U/L (ref 11–51)

## 2018-03-29 LAB — TROPONIN I: Troponin I: <0.03 ng/mL (ref <0.03)

## 2018-03-29 MED ORDER — FAMOTIDINE 40 MG PO TABS: 40.0000 mg | ORAL_TABLET | Freq: Every day | ORAL | 0 refills | Status: DC

## 2018-03-29 NOTE — Discharge Instructions (Signed)

## 2018-03-29 NOTE — ED Provider Notes (Signed)
Emergency Department Provider Note   I have reviewed the triage vital signs and the nursing notes.   HISTORY  Chief Complaint Shortness of Breath   HPI Michael Stephens is a 83 y.o. male with PMH of dCHF, A-flutter, and HTN presents to the emergency department for evaluation of worsening shortness of breath over the past 4 to 5 days.  Patient denies any productive cough or chest pain.  Symptoms are worse with exertion.  Patient's been compliant with his medications including his Lasix.  He has noticed some lower extremity swelling but states that this better than it typically is.  He has been eating a less healthy, higher salt diet over the past several weeks.  No sick contacts or body aches. No other new medications. No radiation of symptoms. Denies any fever, chills, body aches, or dyspnea on exertion. No productive cough.   Patient also describing several weeks of abdominal distension and belching. Notes some improvement with belching. Continues to have BMs. Mild nausea but no vomiting. He feels like his abdominal distension is affecting his breathing at times. No known food intolerance. No significant abdominal pain.   Past Medical History:  Diagnosis Date  . Acute diastolic CHF (congestive heart failure) ([place]) 02/11/2017  . Atrial flutter ([place])    a. diagnosed in 02/2017. Rate-control strategy pursued.   . Hypertension   . Renal disorder    cyst on kidney    Patient Active Problem List   Diagnosis Date Noted  . Urinary retention 09/07/2017  . Constipation 09/07/2017  . CHF exacerbation ([place]) 09/04/2017  . Acute on chronic diastolic CHF (congestive heart failure) ([place]) 09/04/2017  . Encounter for therapeutic drug monitoring 08/27/2017  . Change in stool 03/18/2017  . Diarrhea 03/02/2017  . Aortic stenosis 03/02/2017  . CAP (community acquired pneumonia) 02/16/2017  . Essential hypertension 02/11/2017  . Demand ischemia ([place]) 02/11/2017  . Acute diastolic CHF (congestive  heart failure) ([place]) 02/11/2017  . Acute respiratory failure with hypoxia ([place]) 02/11/2017  . Atrial flutter ([place]) 02/09/2017    Past Surgical History:  Procedure Laterality Date  . ABCESS DRAINAGE  11/2008   BUTTOCKS  . CATARACT EXTRACTION, BILATERAL    . COLONOSCOPY N/A 04/02/2017   Procedure: COLONOSCOPY;  Surgeon: Rogene Houston, MD;  [facility];  Service: Endoscopy;  Laterality: N/A;  10:55  . POLYPECTOMY  04/02/2017   Procedure: POLYPECTOMY;  Surgeon: Rogene Houston, MD;  Location: [facility];  Service: Endoscopy;;  asceding colon(hot snare)/ sigmoid colon times two (cold snare)    Allergies Penicillins  Family History  Problem Relation Age of Onset  . CVA Father   . CAD Father   . Diabetes Sister   . Colon cancer Neg Hx     Social History Social History   Tobacco Use  . Smoking status: Former Smoker    Types: Cigarettes    Last attempt to quit: 2006    Years since quitting: 13.9  . Smokeless tobacco: Never Used  Substance Use Topics  . Alcohol use: No  . Drug use: No    Review of Systems  Constitutional: No fever/chills Eyes: No visual changes. ENT: No sore throat. Cardiovascular: Denies chest pain. Respiratory: Positive shortness of breath. Gastrointestinal: No abdominal pain.  No nausea, no vomiting.  No diarrhea.  No constipation. Genitourinary: Negative for dysuria. Musculoskeletal: Negative for back pain. Skin: Negative for rash. Neurological: Negative for headaches, focal weakness or numbness.  10-point ROS otherwise negative.  ____________________________________________  PHYSICAL EXAM:  VITAL SIGNS: ED Triage Vitals  Enc Vitals Group     BP 03/29/18 1429 (!) 154/71     Pulse Rate 03/29/18 1429 (!) 52     Resp 03/29/18 1429 18     Temp 03/29/18 1429 98.2 F (36.8 C)     Temp Source 03/29/18 1429 Oral     SpO2 03/29/18 1429 97 %     Weight 03/29/18 1424 200 lb (90.7 kg)     Height 03/29/18 1424 6' (1.829 m)      Pain Score 03/29/18 1424 0   Constitutional: Alert and oriented. Well appearing and in no acute distress. Eyes: Conjunctivae are normal. Head: Atraumatic. Nose: No congestion/rhinnorhea. Mouth/Throat: Mucous membranes are moist. Neck: No stridor.   Cardiovascular: Bradycardia. Good peripheral circulation. Grossly normal heart sounds.   Respiratory: Normal respiratory effort.  No retractions. Lungs CTAB. Gastrointestinal: Soft and nontender. Mild/moderate distention.  Musculoskeletal: No lower extremity tenderness with 1+ pitting edema bilaterally. No gross deformities of extremities. Neurologic:  Normal speech and language. No gross focal neurologic deficits are appreciated.  Skin:  Skin is warm, dry and intact. No rash noted.  ____________________________________________   LABS (all labs ordered are listed, but only abnormal results are displayed)  Labs Reviewed  CBC WITH DIFFERENTIAL/PLATELET - Abnormal; Notable for the following components:      Result Value   RBC 2.98 (*)    Hemoglobin 9.1 (*)    HCT 30.1 (*)    MCV 101.0 (*)    RDW 15.9 (*)    Monocytes Absolute 1.2 (*)    All other components within normal limits  COMPREHENSIVE METABOLIC PANEL - Abnormal; Notable for the following components:   BUN 35 (*)    All other components within normal limits  BRAIN NATRIURETIC PEPTIDE - Abnormal; Notable for the following components:   B Natriuretic Peptide 105.0 (*)    All other components within normal limits  PROTIME-INR - Abnormal; Notable for the following components:   Prothrombin Time 18.3 (*)    All other components within normal limits  LIPASE, BLOOD  TROPONIN I   ____________________________________________  EKG   EKG Interpretation  Date/Time:  Monday March 29 2018 14:27:17 EST Ventricular Rate:  51 PR Interval:  198 QRS Duration: 106 QT Interval:  486 QTC Calculation: 447 R Axis:   8 Text Interpretation:  Sinus bradycardia Nonspecific ST abnormality  Abnormal ECG No STEMI.  ST changes unchanged from May 2019 tracing.  Confirmed by Nanda Quinton [phone]) on 03/29/2018 5:05:07 PM       ____________________________________________  RADIOLOGY  Dg Chest 2 View  Result Date: 03/29/2018 CLINICAL DATA:  Upper and mid abdominal pain, swelling and distension. Shortness of breath for the past 3 days. EXAM: CHEST - 2 VIEW COMPARISON:  09/08/2017, 09/07/2017, 05/08/2017, 03/08/2017, 02/12/2017 and 02/09/2017. FINDINGS: The cardiac silhouette remains borderline enlarged. Small amount of increased density at the left lateral lung base. Small nodular densities in the right mid lung zone without significant change since 05/08/2017. The pulmonary vasculature remains mildly prominent. Unremarkable bones. IMPRESSION: 1. Small amount of atelectasis or scarring at the left lateral lung base. 2. Stable small nodular densities in the right mid lung zone. 3. Stable borderline cardiomegaly and mild pulmonary vascular congestion. Electronically Signed   By: Claudie Revering M.D.   On: 03/29/2018 15:06   Dg Abd 2 Views  Result Date: 03/29/2018 CLINICAL DATA:  Dyspnea x3 days with nonproductive cough. EXAM: ABDOMEN - 2 VIEW COMPARISON:  09/07/2017  FINDINGS: Nonspecific bowel gas pattern with scattered air containing small and large bowel loops without definite mechanical bowel obstruction. No free air. No organomegaly nor radiopaque calculi. Cardiomegaly is identified with slightly confluent opacities at the left lung base. Pneumonia and/or atelectasis might account for this appearance. Thoracolumbar spondylosis. Osteoarthritis of the included hip joints. Acute osseous abnormality. No radiopaque calculi. IMPRESSION: 1. Nonspecific bowel gas pattern with scattered air containing small and large bowel loops. 2. Cardiomegaly. 3. Slightly confluent opacities are noted of the included left lung base. Atelectasis, scarring or pneumonia might account for this appearance. Electronically  Signed   By: Ashley Royalty M.D.   On: 03/29/2018 19:07    ____________________________________________   PROCEDURES  Procedure(s) performed:   Procedures  None ____________________________________________   INITIAL IMPRESSION / ASSESSMENT AND PLAN / ED COURSE  Pertinent labs & imaging results that were available during my care of the patient were reviewed by me and considered in my medical decision making (see chart for details).  Patient presents to the emergency department with shortness of breath symptoms worsening over the past 4 to 5 days.  No chest pain or cough.  No hypoxemia.  Patient with a baseline bradycardia.  ST changes on EKG similar to prior.  Chest x-ray with no acute process.  Labs reviewed and otherwise unremarkable.  Troponin and BNP are pending.  Troponin and BNP are normal. Plain film of the abdomen with no acute findings. Symptoms are persistent. No concern for atypical ACS. Plan for Famotidine and GI follow up for GI symptoms. He has seen Dr. Laural Golden in the past. Patient to continue home meds and return to the ED with any new or worsening symptoms.  ____________________________________________  FINAL CLINICAL IMPRESSION(S) / ED DIAGNOSES  Final diagnoses:  Abdominal pain  Cough     NEW OUTPATIENT MEDICATIONS STARTED DURING THIS VISIT:  Discharge Medication List as of 03/29/2018  7:20 PM    START taking these medications   Details  famotidine (PEPCID) 40 MG tablet Take 1 tablet (40 mg total) by mouth daily., Starting Mon 03/29/2018, Until Wed 04/28/2018, Print        Note:  This document was prepared using Dragon voice recognition software and may include unintentional dictation errors.  Nanda Quinton, MD Emergency Medicine    Long, Wonda Olds, MD 03/29/18 2107

## 2018-03-29 NOTE — ED Triage Notes (Addendum)
Pt c/o SOB x 3 days with mild non-productive cough. Pt also c/o intermittent abdominal pain and distention. Denies fever. Denies edema in lower extremities.

## 2018-03-31 ENCOUNTER — Encounter (HOSPITAL_COMMUNITY): Payer: Self-pay | Admitting: Emergency Medicine

## 2018-03-31 ENCOUNTER — Emergency Department (HOSPITAL_COMMUNITY): Payer: Medicare Other

## 2018-03-31 ENCOUNTER — Inpatient Hospital Stay (HOSPITAL_COMMUNITY)
Admission: EM | Admit: 2018-03-31 | Discharge: 2018-04-04 | DRG: 291 | Disposition: A | Discharge status: Home / Self Care | Payer: Medicare Other | Attending: Internal Medicine | Admitting: Internal Medicine

## 2018-03-31 ENCOUNTER — Other Ambulatory Visit: Payer: Self-pay

## 2018-03-31 DIAGNOSIS — I480 Paroxysmal atrial fibrillation: Secondary | ICD-10-CM | POA: Diagnosis present

## 2018-03-31 DIAGNOSIS — Z7901 Long term (current) use of anticoagulants: Secondary | ICD-10-CM | POA: Diagnosis not present

## 2018-03-31 DIAGNOSIS — I4892 Unspecified atrial flutter: Secondary | ICD-10-CM | POA: Diagnosis present

## 2018-03-31 DIAGNOSIS — J9601 Acute respiratory failure with hypoxia: Secondary | ICD-10-CM | POA: Diagnosis present

## 2018-03-31 DIAGNOSIS — I5033 Acute on chronic diastolic (congestive) heart failure: Secondary | ICD-10-CM | POA: Diagnosis present

## 2018-03-31 DIAGNOSIS — I110 Hypertensive heart disease with heart failure: Principal | ICD-10-CM | POA: Diagnosis present

## 2018-03-31 DIAGNOSIS — E785 Hyperlipidemia, unspecified: Secondary | ICD-10-CM | POA: Diagnosis present

## 2018-03-31 DIAGNOSIS — Z823 Family history of stroke: Secondary | ICD-10-CM

## 2018-03-31 DIAGNOSIS — N400 Enlarged prostate without lower urinary tract symptoms: Secondary | ICD-10-CM | POA: Diagnosis present

## 2018-03-31 DIAGNOSIS — M791 Myalgia, unspecified site: Secondary | ICD-10-CM | POA: Diagnosis present

## 2018-03-31 DIAGNOSIS — Z833 Family history of diabetes mellitus: Secondary | ICD-10-CM

## 2018-03-31 DIAGNOSIS — Z8249 Family history of ischemic heart disease and other diseases of the circulatory system: Secondary | ICD-10-CM

## 2018-03-31 DIAGNOSIS — Z880 Allergy status to penicillin: Secondary | ICD-10-CM | POA: Diagnosis not present

## 2018-03-31 DIAGNOSIS — I5023 Acute on chronic systolic (congestive) heart failure: Secondary | ICD-10-CM | POA: Insufficient documentation

## 2018-03-31 DIAGNOSIS — Z87891 Personal history of nicotine dependence: Secondary | ICD-10-CM

## 2018-03-31 DIAGNOSIS — Z66 Do not resuscitate: Secondary | ICD-10-CM | POA: Diagnosis present

## 2018-03-31 DIAGNOSIS — I10 Essential (primary) hypertension: Secondary | ICD-10-CM | POA: Diagnosis present

## 2018-03-31 DIAGNOSIS — R0602 Shortness of breath: Secondary | ICD-10-CM | POA: Diagnosis not present

## 2018-03-31 DIAGNOSIS — Z79899 Other long term (current) drug therapy: Secondary | ICD-10-CM | POA: Diagnosis not present

## 2018-03-31 DIAGNOSIS — I493 Ventricular premature depolarization: Secondary | ICD-10-CM | POA: Diagnosis present

## 2018-03-31 DIAGNOSIS — I5020 Unspecified systolic (congestive) heart failure: Secondary | ICD-10-CM | POA: Diagnosis not present

## 2018-03-31 HISTORY — DX: Benign prostatic hyperplasia without lower urinary tract symptoms: N40.0

## 2018-03-31 HISTORY — DX: Chronic diastolic (congestive) heart failure: I50.32

## 2018-03-31 LAB — CBC WITH DIFFERENTIAL/PLATELET
Abs Immature Granulocytes: 0.04 10*3/uL (ref 0.00–0.07)
Basophils Absolute: 0 10*3/uL (ref 0.0–0.1)
Basophils Relative: 0 %
Eosinophils Absolute: 0.1 10*3/uL (ref 0.0–0.5)
Eosinophils Relative: 1 %
HCT: 31.7 % — ABNORMAL LOW (ref 39.0–52.0)
Hemoglobin: 9.5 g/dL — ABNORMAL LOW (ref 13.0–17.0)
Immature Granulocytes: 1 %
Lymphocytes Relative: 9 %
Lymphs Abs: 0.7 10*3/uL (ref 0.7–4.0)
MCH: 30.1 pg (ref 26.0–34.0)
MCHC: 30 g/dL (ref 30.0–36.0)
MCV: 100.3 fL — ABNORMAL HIGH (ref 80.0–100.0)
Monocytes Absolute: 1.1 10*3/uL — ABNORMAL HIGH (ref 0.1–1.0)
Monocytes Relative: 13 %
NRBC: 0.4 % — AB (ref 0.0–0.2)
Neutro Abs: 6.3 10*3/uL (ref 1.7–7.7)
Neutrophils Relative %: 76 %
Platelets: 253 10*3/uL (ref 150–400)
RBC: 3.16 MIL/uL — ABNORMAL LOW (ref 4.22–5.81)
RDW: 15.7 % — ABNORMAL HIGH (ref 11.5–15.5)
WBC: 8.2 10*3/uL (ref 4.0–10.5)

## 2018-03-31 LAB — COMPREHENSIVE METABOLIC PANEL
ALBUMIN: 4.1 g/dL (ref 3.5–5.0)
ALT: 30 U/L (ref 0–44)
ANION GAP: 8 (ref 5–15)
AST: 19 U/L (ref 15–41)
Alkaline Phosphatase: 57 U/L (ref 38–126)
BILIRUBIN TOTAL: 1.7 mg/dL — AB (ref 0.3–1.2)
BUN: 22 mg/dL (ref 8–23)
CO2: 25 mmol/L (ref 22–32)
Calcium: 9.7 mg/dL (ref 8.9–10.3)
Chloride: 106 mmol/L (ref 98–111)
Creatinine, Ser: 0.7 mg/dL (ref 0.61–1.24)
GFR calc Af Amer: >60 mL/min (ref >60)
GFR calc non Af Amer: >60 mL/min (ref >60)
Glucose, Bld: 133 mg/dL — ABNORMAL HIGH (ref 70–99)
Potassium: 4.1 mmol/L (ref 3.5–5.1)
Sodium: 139 mmol/L (ref 135–145)
Total Protein: 7.2 g/dL (ref 6.5–8.1)

## 2018-03-31 LAB — I-STAT TROPONIN, ED: Troponin i, poc: 0.03 ng/mL (ref 0.00–0.08)

## 2018-03-31 LAB — BRAIN NATRIURETIC PEPTIDE: B Natriuretic Peptide: 691 pg/mL — ABNORMAL HIGH (ref 0.0–100.0)

## 2018-03-31 LAB — D-DIMER, QUANTITATIVE: D-Dimer, Quant: 0.42 ug/mL-FEU (ref 0.00–0.50)

## 2018-03-31 LAB — PROTIME-INR
INR: 1.95
Prothrombin Time: 22 seconds — ABNORMAL HIGH (ref 11.4–15.2)

## 2018-03-31 MED ORDER — METOPROLOL TARTRATE 50 MG PO TABS
100.0000 mg | ORAL_TABLET | Freq: Two times a day (BID) | ORAL | Status: DC
  Administered 2018-03-31 – 2018-04-01 (×3): 100 mg via ORAL
  Filled 2018-03-31 (×4): qty 2

## 2018-03-31 MED ORDER — FAMOTIDINE 20 MG PO TABS
40.0000 mg | ORAL_TABLET | Freq: Every day | ORAL | Status: DC
  Administered 2018-04-01 – 2018-04-04 (×4): 40 mg via ORAL
  Filled 2018-03-31 (×4): qty 2

## 2018-03-31 MED ORDER — POTASSIUM CHLORIDE CRYS ER 20 MEQ PO TBCR: 20.0000 meq | EXTENDED_RELEASE_TABLET | Freq: Every day | ORAL | Status: DC

## 2018-03-31 MED ORDER — ALBUTEROL SULFATE (2.5 MG/3ML) 0.083% IN NEBU
5.0000 mg | INHALATION_SOLUTION | Freq: Once | RESPIRATORY_TRACT | Status: AC
  Administered 2018-03-31: 5 mg via RESPIRATORY_TRACT
  Filled 2018-03-31: qty 6

## 2018-03-31 MED ORDER — DILTIAZEM HCL ER COATED BEADS 120 MG PO CP24
120.0000 mg | ORAL_CAPSULE | Freq: Every day | ORAL | Status: DC
  Administered 2018-04-01: 120 mg via ORAL
  Filled 2018-03-31 (×4): qty 1

## 2018-03-31 MED ORDER — LOSARTAN POTASSIUM 50 MG PO TABS
25.0000 mg | ORAL_TABLET | Freq: Every day | ORAL | Status: DC
  Administered 2018-04-01 – 2018-04-04 (×4): 25 mg via ORAL
  Filled 2018-03-31 (×4): qty 1

## 2018-03-31 MED ORDER — FINASTERIDE 5 MG PO TABS
5.0000 mg | ORAL_TABLET | Freq: Every day | ORAL | Status: DC
  Administered 2018-04-03 – 2018-04-04 (×2): 5 mg via ORAL
  Filled 2018-03-31 (×5): qty 1

## 2018-03-31 MED ORDER — IPRATROPIUM-ALBUTEROL 0.5-2.5 (3) MG/3ML IN SOLN
3.0000 mL | Freq: Once | RESPIRATORY_TRACT | Status: AC
  Administered 2018-03-31: 3 mL via RESPIRATORY_TRACT
  Filled 2018-03-31: qty 3

## 2018-03-31 MED ORDER — FUROSEMIDE 10 MG/ML IJ SOLN
40.0000 mg | Freq: Once | INTRAMUSCULAR | Status: AC
  Administered 2018-03-31: 40 mg via INTRAVENOUS
  Filled 2018-03-31: qty 4

## 2018-03-31 MED ORDER — METHYLPREDNISOLONE SODIUM SUCC 125 MG IJ SOLR
125.0000 mg | Freq: Once | INTRAMUSCULAR | Status: AC
  Administered 2018-03-31: 125 mg via INTRAVENOUS
  Filled 2018-03-31: qty 2

## 2018-03-31 MED ORDER — SODIUM CHLORIDE 0.9 % IV SOLN
INTRAVENOUS | Status: DC | PRN
  Administered 2018-03-31: 500 mL via INTRAVENOUS

## 2018-03-31 MED ORDER — MAGNESIUM SULFATE 2 GM/50ML IV SOLN
2.0000 g | Freq: Once | INTRAVENOUS | Status: AC
  Administered 2018-03-31: 2 g via INTRAVENOUS
  Filled 2018-03-31: qty 50

## 2018-03-31 NOTE — ED Provider Notes (Signed)
[facility] EMERGENCY DEPARTMENT Provider Note   CSN: [id number] Arrival date & time: 03/31/18  1536     History   Chief Complaint Chief Complaint  Patient presents with  . Shortness of Breath    HPI Michael Stephens is a 83 y.o. male.  Patient complains of shortness of breath.  Patient states he was here 2 days ago and his shortness of breath got worse  The history is provided by the patient. No language interpreter was used.  Shortness of Breath  This is a recurrent problem. The problem occurs continuously.The current episode started 2 days ago. The problem has not changed since onset.Pertinent negatives include no fever, no headaches, no cough, no chest pain, no abdominal pain and no rash. Precipitated by: Unknown. Risk factors include smoking. He has tried nothing for the symptoms. The treatment provided no relief.    Past Medical History:  Diagnosis Date  . Acute diastolic CHF (congestive heart failure) ([place]) 02/11/2017  . Atrial flutter ([place])    a. diagnosed in 02/2017. Rate-control strategy pursued.   . Hypertension   . Renal disorder    cyst on kidney    Patient Active Problem List   Diagnosis Date Noted  . Urinary retention 09/07/2017  . Constipation 09/07/2017  . CHF exacerbation ([place]) 09/04/2017  . Acute on chronic diastolic CHF (congestive heart failure) ([place]) 09/04/2017  . Encounter for therapeutic drug monitoring 08/27/2017  . Change in stool 03/18/2017  . Diarrhea 03/02/2017  . Aortic stenosis 03/02/2017  . CAP (community acquired pneumonia) 02/16/2017  . Essential hypertension 02/11/2017  . Demand ischemia ([place]) 02/11/2017  . Acute diastolic CHF (congestive heart failure) ([place]) 02/11/2017  . Acute respiratory failure with hypoxia ([place]) 02/11/2017  . Atrial flutter ([place]) 02/09/2017    Past Surgical History:  Procedure Laterality Date  . ABCESS DRAINAGE  11/2008   BUTTOCKS  . CATARACT EXTRACTION, BILATERAL    . COLONOSCOPY N/A 04/02/2017   Procedure: COLONOSCOPY;  Surgeon: Rogene Houston, MD;  [facility];  Service: Endoscopy;  Laterality: N/A;  10:55  . POLYPECTOMY  04/02/2017   Procedure: POLYPECTOMY;  Surgeon: Rogene Houston, MD;  Location[facility];  Service: Endoscopy;;  asceding colon(hot snare)/ sigmoid colon times two (cold snare)        Home Medications    Prior to Admission medications   Medication Sig Start Date End Date Taking? Authorizing Provider  diltiazem (CARDIZEM CD) 120 MG 24 hr capsule Take 1 capsule (120 mg total) by mouth daily. 09/09/17 03/29/18  Murlean Iba, MD  famotidine (PEPCID) 40 MG tablet Take 1 tablet (40 mg total) by mouth daily. 03/29/18 04/28/18  Long, Wonda Olds, MD  finasteride (PROSCAR) 5 MG tablet Take 5 mg by mouth daily.    [provider]  furosemide (LASIX) 80 MG tablet Take 1 tablet (80 mg total) by mouth daily. 12/15/17 03/29/18  Arnoldo Lenis, MD  losartan (COZAAR) 25 MG tablet Take 1 tablet (25 mg total) by mouth daily. 09/09/17 03/29/18  Johnson, Clanford L, MD  metoprolol tartrate (LOPRESSOR) 100 MG tablet Take 1 tablet (100 mg total) by mouth 2 (two) times daily. 02/01/18   Arnoldo Lenis, MD  potassium chloride (K-DUR,KLOR-CON) 20 MEQ tablet Take 1 tablet (20 mEq total) by mouth daily. 09/09/17 03/29/18  Johnson, Clanford L, MD  tamsulosin (FLOMAX) 0.4 MG CAPS capsule Take 0.4 mg by mouth daily.     [provider]  warfarin (COUMADIN) 5 MG tablet Take  1 tablet daily except 1/2 tablet on Mondays and Fridays or as directed 03/08/18   Arnoldo Lenis, MD    Family History Family History  Problem Relation Age of Onset  . CVA Father   . CAD Father   . Diabetes Sister   . Colon cancer Neg Hx     Social History Social History   Tobacco Use  . Smoking status: Former Smoker    Types: Cigarettes    Last attempt to quit: 2006    Years since quitting: 13.9  . Smokeless tobacco: Never Used  Substance Use Topics  . Alcohol  use: No  . Drug use: No     Allergies   Penicillins   Review of Systems Review of Systems  Constitutional: Negative for appetite change, fatigue and fever.  HENT: Negative for congestion, ear discharge and sinus pressure.   Eyes: Negative for discharge.  Respiratory: Positive for shortness of breath. Negative for cough.   Cardiovascular: Negative for chest pain.  Gastrointestinal: Negative for abdominal pain and diarrhea.  Genitourinary: Negative for frequency and hematuria.  Musculoskeletal: Negative for back pain.  Skin: Negative for rash.  Neurological: Negative for seizures and headaches.  Psychiatric/Behavioral: Negative for hallucinations.     Physical Exam Updated Vital Signs BP 130/71   Pulse 87   Temp 98.7 F (37.1 C) (Oral)   Resp 16   Ht 6' (1.829 m)   Wt 90.7 kg   SpO2 93%   BMI 27.12 kg/m   Physical Exam Constitutional:      Appearance: He is well-developed.  HENT:     Head: Normocephalic.     Nose: Nose normal.     Mouth/Throat:     Mouth: Mucous membranes are moist.  Eyes:     General: No scleral icterus.    Conjunctiva/sclera: Conjunctivae normal.  Neck:     Musculoskeletal: Neck supple.     Thyroid: No thyromegaly.  Cardiovascular:     Rate and Rhythm: Normal rate and regular rhythm.     Heart sounds: No murmur. No friction rub. No gallop.   Pulmonary:     Breath sounds: No stridor. Wheezing present. No rales.  Chest:     Chest wall: No tenderness.  Abdominal:     General: There is no distension.     Tenderness: There is no abdominal tenderness. There is no rebound.  Musculoskeletal: Normal range of motion.  Lymphadenopathy:     Cervical: No cervical adenopathy.  Skin:    Findings: No erythema or rash.  Neurological:     Mental Status: He is oriented to person, place, and time.     Motor: No abnormal muscle tone.     Coordination: Coordination normal.  Psychiatric:        Behavior: Behavior normal.      ED Treatments /  Results  Labs (all labs ordered are listed, but only abnormal results are displayed) Labs Reviewed  CBC WITH DIFFERENTIAL/PLATELET - Abnormal; Notable for the following components:      Result Value   RBC 3.16 (*)    Hemoglobin 9.5 (*)    HCT 31.7 (*)    MCV 100.3 (*)    RDW 15.7 (*)    nRBC 0.4 (*)    Monocytes Absolute 1.1 (*)    All other components within normal limits  COMPREHENSIVE METABOLIC PANEL - Abnormal; Notable for the following components:   Glucose, Bld 133 (*)    Total Bilirubin 1.7 (*)    All  other components within normal limits  BRAIN NATRIURETIC PEPTIDE - Abnormal; Notable for the following components:   B Natriuretic Peptide 691.0 (*)    All other components within normal limits  D-DIMER, QUANTITATIVE (NOT AT [facility])  PROTIME-INR  I-STAT TROPONIN, ED    EKG EKG Interpretation  Date/Time:  Wednesday March 31 2018 15:57:09 EST Ventricular Rate:  92 PR Interval:    QRS Duration: 109 QT Interval:  376 QTC Calculation: 463 R Axis:   -2 Text Interpretation:  Sinus rhythm Multiform ventricular premature complexes Low voltage, precordial leads Repol abnrm suggests ischemia, diffuse leads Baseline wander in lead(s) V1 V4 Confirmed by Milton Ferguson [id number] on 03/31/2018 4:59:53 PM   Radiology Dg Chest Portable 1 View  Result Date: 03/31/2018 CLINICAL DATA:  Shortness of breath. EXAM: PORTABLE CHEST 1 VIEW COMPARISON:  03/29/2018 FINDINGS: Increased interstitial densities in both lungs, left side greater than right. Findings are suggestive for pulmonary edema. Heart size is upper limits of normal but stable. The trachea is midline. Negative for pneumothorax. IMPRESSION: Increased interstitial densities, particularly in the left lung. Findings are suggestive for asymmetric pulmonary edema. Electronically Signed   By: Markus Daft M.D.   On: 03/31/2018 16:22   Dg Abd 2 Views  Result Date: 03/29/2018 CLINICAL DATA:  Dyspnea x3 days with nonproductive cough. EXAM:  ABDOMEN - 2 VIEW COMPARISON:  09/07/2017 FINDINGS: Nonspecific bowel gas pattern with scattered air containing small and large bowel loops without definite mechanical bowel obstruction. No free air. No organomegaly nor radiopaque calculi. Cardiomegaly is identified with slightly confluent opacities at the left lung base. Pneumonia and/or atelectasis might account for this appearance. Thoracolumbar spondylosis. Osteoarthritis of the included hip joints. Acute osseous abnormality. No radiopaque calculi. IMPRESSION: 1. Nonspecific bowel gas pattern with scattered air containing small and large bowel loops. 2. Cardiomegaly. 3. Slightly confluent opacities are noted of the included left lung base. Atelectasis, scarring or pneumonia might account for this appearance. Electronically Signed   By: Ashley Royalty M.D.   On: 03/29/2018 19:07    Procedures Procedures (including critical care time)  Medications Ordered in ED Medications  0.9 %  sodium chloride infusion (500 mLs Intravenous New Bag/Given 03/31/18 1613)  furosemide (LASIX) injection 40 mg (has no administration in time range)  albuterol (PROVENTIL) (2.5 MG/3ML) 0.083% nebulizer solution 5 mg (5 mg Nebulization Given 03/31/18 1617)  ipratropium-albuterol (DUONEB) 0.5-2.5 (3) MG/3ML nebulizer solution 3 mL (3 mLs Nebulization Given 03/31/18 1617)  methylPREDNISolone sodium succinate (SOLU-MEDROL) 125 mg/2 mL injection 125 mg (125 mg Intravenous Given 03/31/18 1613)  magnesium sulfate IVPB 2 g 50 mL ( Intravenous Stopped 03/31/18 1715)     Initial Impression / Assessment and Plan / ED Course  I have reviewed the triage vital signs and the nursing notes.  Pertinent labs & imaging results that were available during my care of the patient were reviewed by me and considered in my medical decision making (see chart for details).     Patient with mild to moderate congestive heart failure.  He will be admitted to medicine and diuresed  Final Clinical  Impressions(s) / ED Diagnoses   Final diagnoses:  None    ED Discharge Orders    None       Milton Ferguson, MD 03/31/18 1735

## 2018-03-31 NOTE — H&P (Signed)
History and Physical    PLEASE NOTE THAT DRAGON DICTATION SOFTWARE WAS USED IN THE CONSTRUCTION OF THIS NOTE.   Michael Stephens WPY:[id number] DOB: 01/15/1935 DOA: 03/31/2018  PCP: [facility] Patient coming from: Home  I have personally briefly reviewed patient's old medical records in [place]  Chief Complaint: Shortness of breath  HPI: Michael Stephens is a 83 y.o. male with medical history significant for chronic diastolic heart failure, paroxysmal atrial flutter chronically anticoagulated on warfarin, hypertension, who was admitted to [facility] on 03/31/2017 with acute on chronic diastolic heart failure after presenting from home to the [facility] emergency department complaining of shortness of breath.    The following history is obtained via my discussions with the patient as well as my discussions with the emergency department physician and via chart review.  The patient reports 2 to 3 days of progressive shortness of breath associated with increasing orthopnea.  Denies any associated chest pain, diaphoresis, dizziness, presyncope, or syncope.  He does however report intermittent palpitations which started following the onset of shortness of breath.  In the setting of his chronic diastolic heart failure, the patient reports chronic edema in the bilateral lower extremities, but has not noted any significant worsening thereof over the last few days.  Denies any subjective fever, chills, or rigors.  Denies any recent travel.  Reports good compliance with his outpatient diuretic regimen which consists of Lasix 80 mg p.o. daily.  The patient reports that he presented to the emergency department yesterday with the above symptoms.  He states that he was treated with a single dose of IV Lasix and discharged to home from the ED, but is experience progression of the after mentioned symptoms subsequent to this prompting him to return to the emergency department  this evening for further evaluation.  He denies any baseline supplemental oxygen requirements.  Most recent echocardiogram occurred in October 2018 and showed mild LVH, LVEF 50 to 55%, diminished diastolic function, severely dilated left atrium, and no significant valvular pathology.   ED Course: Vital signs in the ED this evening were notable for the following: Temperature 98.7; heart rate 86-92; blood pressure 148/73; respiratory rate 22-23, oxygen saturation 86% on room air, which improved to 93 to 95% on 2 L nasal cannula.  Labs in the ED this evening were notable for the following: BNP 700, compared to prior value of 100; troponin 0 0.03.  CBC notable for white blood cell count of 8200.  INR 1.95.  Chest x-ray, per final radiology report showed evidence of pulmonary edema.  EKG showed sinus rhythm with multiple PVCs, ventricular rate 92, normal axis, and nonspecific T wave inversion in V1 as well as nonspecific T wave depression in V4.  While still in the ED, patient received Lasix 4 mg IV x1, and was subsequently admitted to the med telemetry floor for further evaluation and management of acute on chronic diastolic heart failure.    Review of Systems: As per HPI otherwise 10 point review of systems negative.   Past Medical History:  Diagnosis Date  . Acute diastolic CHF (congestive heart failure) ([place]) 02/11/2017  . Atrial flutter ([place])    a. diagnosed in 02/2017. Rate-control strategy pursued.   Marland Kitchen BPH (benign prostatic hyperplasia)   . Chronic diastolic heart failure ([place])   . Hypertension   . Renal disorder    cyst on kidney    Past Surgical History:  Procedure Laterality Date  . ABCESS DRAINAGE  11/2008  BUTTOCKS  . CATARACT EXTRACTION, BILATERAL    . COLONOSCOPY N/A 04/02/2017   Procedure: COLONOSCOPY;  Surgeon: Rogene Houston, MD;  Location: [facility];  Service: Endoscopy;  Laterality: N/A;  10:55  . POLYPECTOMY  04/02/2017   Procedure: POLYPECTOMY;  Surgeon:  Rogene Houston, MD;  Location: [facility];  Service: Endoscopy;;  asceding colon(hot snare)/ sigmoid colon times two (cold snare)    Social History:  reports that he quit smoking about 13 years ago. His smoking use included cigarettes. He has never used smokeless tobacco. He reports that he does not drink alcohol or use drugs.   Allergies  Allergen Reactions  . Penicillins     Has patient had a PCN reaction causing immediate rash, facial/tongue/throat swelling, SOB or lightheadedness with hypotension: Yes Has patient had a PCN reaction causing severe rash involving mucus membranes or skin necrosis: No Has patient had a PCN reaction that required hospitalization: No Has patient had a PCN reaction occurring within the last 10 years: No If all of the above answers are "NO", then may proceed with Cephalosporin use.    Family History  Problem Relation Age of Onset  . CVA Father   . CAD Father   . Diabetes Sister   . Colon cancer Neg Hx      Prior to Admission medications   Medication Sig Start Date End Date Taking? Authorizing Provider  diltiazem (CARDIZEM CD) 120 MG 24 hr capsule Take 1 capsule (120 mg total) by mouth daily. 09/09/17 03/29/18  Murlean Iba, MD  famotidine (PEPCID) 40 MG tablet Take 1 tablet (40 mg total) by mouth daily. 03/29/18 04/28/18  Long, Wonda Olds, MD  finasteride (PROSCAR) 5 MG tablet Take 5 mg by mouth daily.    [provider]  furosemide (LASIX) 80 MG tablet Take 1 tablet (80 mg total) by mouth daily. 12/15/17 03/29/18  Arnoldo Lenis, MD  losartan (COZAAR) 25 MG tablet Take 1 tablet (25 mg total) by mouth daily. 09/09/17 03/29/18  Johnson, Clanford L, MD  metoprolol tartrate (LOPRESSOR) 100 MG tablet Take 1 tablet (100 mg total) by mouth 2 (two) times daily. 02/01/18   Arnoldo Lenis, MD  potassium chloride (K-DUR,KLOR-CON) 20 MEQ tablet Take 1 tablet (20 mEq total) by mouth daily. 09/09/17 03/29/18  Johnson, Clanford L, MD  tamsulosin  (FLOMAX) 0.4 MG CAPS capsule Take 0.4 mg by mouth daily.     [provider]  warfarin (COUMADIN) 5 MG tablet Take 1 tablet daily except 1/2 tablet on Mondays and Fridays or as directed 03/08/18   Arnoldo Lenis, MD     Objective     Physical Exam: Vitals:   03/31/18 1618 03/31/18 1630 03/31/18 1700 03/31/18 1732  BP:  135/65 130/71 (!) 143/69  Pulse:  84 87 88  Resp:  (!) 41 16   Temp:      TempSrc:      SpO2: 95% 100% 93% 95%  Weight:      Height:        General: appears to be stated age; alert, oriented Skin: warm, dry, no rash Head:  AT/[place] Eyes:  PEARL b/l, EOMI Mouth:  Oral mucosa membranes appear moist, normal dentition Neck: supple; trachea midline Heart:  RRR; 2/6 systolic murmur noted Lungs: Bibasilar crackles noted in the absence of any wheezing or rhonchi Abdomen: + BS; soft, ND, NT Extremities: 1+ pretibial edema in the bilateral lower extremities, no muscle wasting   Labs on Admission: I  have personally reviewed following labs and imaging studies  CBC: Recent Labs  Lab 03/29/18 1509 03/31/18 1553  WBC 8.6 8.2  NEUTROABS 5.9 6.3  HGB 9.1* 9.5*  HCT 30.1* 31.7*  MCV 101.0* 100.3*  PLT 259 124   Basic Metabolic Panel: Recent Labs  Lab 03/29/18 1509 03/31/18 1553  NA 139 139  K 4.2 4.1  CL 104 106  CO2 28 25  GLUCOSE 99 133*  BUN 35* 22  CREATININE 1.02 0.70  CALCIUM 9.7 9.7   GFR: Estimated Creatinine Clearance: 79.5 mL/min (by C-G formula based on SCr of 0.7 mg/dL). Liver Function Tests: Recent Labs  Lab 03/29/18 1509 03/31/18 1553  AST 30 19  ALT 39 30  ALKPHOS 57 57  BILITOT 0.9 1.7*  PROT 6.6 7.2  ALBUMIN 3.9 4.1   Recent Labs  Lab 03/29/18 1509  LIPASE 26   No results for input(s): AMMONIA in the last 168 hours. Coagulation Profile: Recent Labs  Lab 03/29/18 1509 03/31/18 1553  INR 1.54 1.95   Cardiac Enzymes: Recent Labs  Lab 03/29/18 1509  TROPONINI <0.03   BNP (last 3 results) No results for  input(s): PROBNP in the last 8760 hours. HbA1C: No results for input(s): HGBA1C in the last 72 hours. CBG: No results for input(s): GLUCAP in the last 168 hours. Lipid Profile: No results for input(s): CHOL, HDL, LDLCALC, TRIG, CHOLHDL, LDLDIRECT in the last 72 hours. Thyroid Function Tests: No results for input(s): TSH, T4TOTAL, FREET4, T3FREE, THYROIDAB in the last 72 hours. Anemia Panel: No results for input(s): VITAMINB12, FOLATE, FERRITIN, TIBC, IRON, RETICCTPCT in the last 72 hours. Urine analysis:    Component Value Date/Time   COLORURINE YELLOW 11/06/2017 0936   APPEARANCEUR HAZY (A) 11/06/2017 0936   LABSPEC 1.010 11/06/2017 0936   PHURINE 6.0 11/06/2017 0936   GLUCOSEU NEGATIVE 11/06/2017 0936   HGBUR SMALL (A) 11/06/2017 0936   BILIRUBINUR NEGATIVE 11/06/2017 0936   KETONESUR NEGATIVE 11/06/2017 0936   PROTEINUR NEGATIVE 11/06/2017 0936   NITRITE POSITIVE (A) 11/06/2017 0936   LEUKOCYTESUR LARGE (A) 11/06/2017 0936    Radiological Exams on Admission: Dg Chest Portable 1 View  Result Date: 03/31/2018 CLINICAL DATA:  Shortness of breath. EXAM: PORTABLE CHEST 1 VIEW COMPARISON:  03/29/2018 FINDINGS: Increased interstitial densities in both lungs, left side greater than right. Findings are suggestive for pulmonary edema. Heart size is upper limits of normal but stable. The trachea is midline. Negative for pneumothorax. IMPRESSION: Increased interstitial densities, particularly in the left lung. Findings are suggestive for asymmetric pulmonary edema. Electronically Signed   By: Markus Daft M.D.   On: 03/31/2018 16:22   Dg Abd 2 Views  Result Date: 03/29/2018 CLINICAL DATA:  Dyspnea x3 days with nonproductive cough. EXAM: ABDOMEN - 2 VIEW COMPARISON:  09/07/2017 FINDINGS: Nonspecific bowel gas pattern with scattered air containing small and large bowel loops without definite mechanical bowel obstruction. No free air. No organomegaly nor radiopaque calculi. Cardiomegaly is  identified with slightly confluent opacities at the left lung base. Pneumonia and/or atelectasis might account for this appearance. Thoracolumbar spondylosis. Osteoarthritis of the included hip joints. Acute osseous abnormality. No radiopaque calculi. IMPRESSION: 1. Nonspecific bowel gas pattern with scattered air containing small and large bowel loops. 2. Cardiomegaly. 3. Slightly confluent opacities are noted of the included left lung base. Atelectasis, scarring or pneumonia might account for this appearance. Electronically Signed   By: Ashley Royalty M.D.   On: 03/29/2018 19:07     Assessment/Plan   Arnette Norris  Reali is a 83 y.o. male with medical history significant for chronic diastolic heart failure, paroxysmal atrial flutter chronically anticoagulated on warfarin, hypertension, who was admitted to [facility] on 03/31/2017 with acute on chronic diastolic heart failure after presenting from home to the [facility] emergency department complaining of shortness of breath.    Principal Problem:   Acute on chronic diastolic CHF (congestive heart failure) (HCC) Active Problems:   Paroxysmal atrial flutter (HCC)   Essential hypertension   Shortness of breath   BPH (benign prostatic hyperplasia)   #) Acute on chronic diastolic heart failure: Diagnosis on the basis of presenting 2 to 3 days of progressive shortness of breath associated with worsening orthopnea as well as presenting tachypnea, mild presenting hypoxia in the context of no baseline supplemental oxygen requirements, and interval 7 fold increase in BNP, and chest x-ray demonstrating evidence of interval development of pulmonary edema.  This is in the context of a known history of chronic diastolic heart failure, with most recent echocardiogram performed in October 2018, with results further detailed above.  Of note, outpatient diuretic regimen consists of Lasix 80 mg p.o. daily.  No evidence of acute ischemic changes on presenting  EKG.  Plan: For now we will up regulate effective dose of Lasix by transitioning to Lasix 60 mg IV twice daily.  Will monitor strict I's and O's as well as daily weights.  Patient politely refused offer for Foley catheter for assistance with monitoring of accurate/strict I's and O's.  Repeat BMP as well as serum magnesium level in the morning, with as needed supplementation of potassium and magnesium on the basis of these ensuing values.  Additionally, will closely monitor ensuing renal function response to after mentioned diuretic approach.  As the most recent echocardiogram occurred greater than 1 year ago, there may also be benefit to ultimately pursuing an updated echocardiogram once patient is more euvolemic.  Monitor on telemetry.    #) Paroxysmal atrial flutter: Chronically anticoagulated on warfarin, with presenting INR noted to be 1.95.  AV nodal blocking regimen as an outpatient consists of diltiazem as well as Lopressor.  Presenting EKG reflects sinus rhythm.  Plan: I placed an inpatient pharmacy consult for assistance with warfarin dosing during this hospitalization.  Continue home diltiazem as well as Lopressor.  In the context of acutely decompensated heart failure, will also be monitoring the patient on telemetry.    #) Essential hypertension: Outpatient medication regimen with antihypertensive implications include diltiazem, Lopressor, Lasix, tamsulosin, and losartan.  Presenting blood pressure noted to be 148/73.  Plan: Resume home antihypertensive regimen, with the exception of oral Lasix, which will be transiently converted to IV form for more aggressive diuresis in the setting of presenting acutely decompensated heart failure, as further described above.   #) Benign prostatic hyperplasia: On finasteride and tamsulosin as an outpatient.  Plan: Resume home finasteride and tamsulosin.    DVT prophylaxis: Chronically anticoagulated on warfarin. Code Status: DNR/DNI, per my  discussions with the patient this evening. Family Communication: (none) Disposition Plan:  Per Rounding Team Consults called: (none)  Admission status: inpatient/med-tele    PLEASE NOTE THAT DRAGON DICTATION SOFTWARE WAS USED IN THE CONSTRUCTION OF THIS NOTE.   [place] [facility] Pager [phone] From 3PM- 11PM.   Otherwise, please contact night-coverage [email or web address] Password [facility]  03/31/2018, 6:36 PM

## 2018-03-31 NOTE — ED Notes (Signed)
Admitting provider at bedside.

## 2018-03-31 NOTE — ED Triage Notes (Signed)
Patient c/o of continued and worsening SOB. Seen here yesterday, reports he got worse in the middle of the night.

## 2018-04-01 ENCOUNTER — Other Ambulatory Visit: Payer: Self-pay

## 2018-04-01 ENCOUNTER — Inpatient Hospital Stay (HOSPITAL_COMMUNITY): Payer: Medicare Other

## 2018-04-01 DIAGNOSIS — N400 Enlarged prostate without lower urinary tract symptoms: Secondary | ICD-10-CM | POA: Diagnosis present

## 2018-04-01 DIAGNOSIS — R0602 Shortness of breath: Secondary | ICD-10-CM | POA: Diagnosis present

## 2018-04-01 DIAGNOSIS — I480 Paroxysmal atrial fibrillation: Secondary | ICD-10-CM

## 2018-04-01 DIAGNOSIS — I5033 Acute on chronic diastolic (congestive) heart failure: Secondary | ICD-10-CM

## 2018-04-01 DIAGNOSIS — I10 Essential (primary) hypertension: Secondary | ICD-10-CM

## 2018-04-01 DIAGNOSIS — J9601 Acute respiratory failure with hypoxia: Secondary | ICD-10-CM

## 2018-04-01 LAB — CBC
HCT: 27.8 % — ABNORMAL LOW (ref 39.0–52.0)
Hemoglobin: 8.4 g/dL — ABNORMAL LOW (ref 13.0–17.0)
MCH: 30.3 pg (ref 26.0–34.0)
MCHC: 30.2 g/dL (ref 30.0–36.0)
MCV: 100.4 fL — ABNORMAL HIGH (ref 80.0–100.0)
Platelets: 225 10*3/uL (ref 150–400)
RBC: 2.77 MIL/uL — ABNORMAL LOW (ref 4.22–5.81)
RDW: 15.4 % (ref 11.5–15.5)
WBC: 5.5 10*3/uL (ref 4.0–10.5)
nRBC: 0.4 % — ABNORMAL HIGH (ref 0.0–0.2)

## 2018-04-01 LAB — BASIC METABOLIC PANEL
Anion gap: 6 (ref 5–15)
BUN: 22 mg/dL (ref 8–23)
CO2: 25 mmol/L (ref 22–32)
Calcium: 9 mg/dL (ref 8.9–10.3)
Chloride: 106 mmol/L (ref 98–111)
Creatinine, Ser: 0.72 mg/dL (ref 0.61–1.24)
GFR calc Af Amer: >60 mL/min (ref >60)
GFR calc non Af Amer: >60 mL/min (ref >60)
Glucose, Bld: 161 mg/dL — ABNORMAL HIGH (ref 70–99)
POTASSIUM: 4.6 mmol/L (ref 3.5–5.1)
Sodium: 137 mmol/L (ref 135–145)

## 2018-04-01 LAB — PROTIME-INR
INR: 1.94
Prothrombin Time: 21.9 seconds — ABNORMAL HIGH (ref 11.4–15.2)

## 2018-04-01 LAB — MAGNESIUM: Magnesium: 2.6 mg/dL — ABNORMAL HIGH (ref 1.7–2.4)

## 2018-04-01 LAB — IRON AND TIBC
Iron: 38 ug/dL — ABNORMAL LOW (ref 45–182)
Saturation Ratios: 8 % — ABNORMAL LOW (ref 17.9–39.5)
TIBC: 461 ug/dL — ABNORMAL HIGH (ref 250–450)
UIBC: 423 ug/dL

## 2018-04-01 LAB — FERRITIN: Ferritin: 41 ng/mL (ref 24–336)

## 2018-04-01 LAB — HEPATIC FUNCTION PANEL
ALT: 26 U/L (ref 0–44)
AST: 20 U/L (ref 15–41)
Albumin: 3.4 g/dL — ABNORMAL LOW (ref 3.5–5.0)
Alkaline Phosphatase: 47 U/L (ref 38–126)
Bilirubin, Direct: 0.2 mg/dL (ref 0.0–0.2)
Indirect Bilirubin: 0.7 mg/dL (ref 0.3–0.9)
Total Bilirubin: 0.9 mg/dL (ref 0.3–1.2)
Total Protein: 6.1 g/dL — ABNORMAL LOW (ref 6.5–8.1)

## 2018-04-01 LAB — HEMOGLOBIN A1C
Hgb A1c MFr Bld: 5.1 % (ref 4.8–5.6)
Mean Plasma Glucose: 99.67 mg/dL

## 2018-04-01 LAB — VITAMIN B12: VITAMIN B 12: 154 pg/mL — AB (ref 180–914)

## 2018-04-01 LAB — FOLATE: FOLATE: 10.1 ng/mL (ref >5.9)

## 2018-04-01 LAB — ECHOCARDIOGRAM COMPLETE
Height: 72 in
Weight: 3265.6 oz

## 2018-04-01 MED ORDER — FUROSEMIDE 10 MG/ML IJ SOLN
20.0000 mg | Freq: Once | INTRAMUSCULAR | Status: AC
  Administered 2018-04-01: 20 mg via INTRAVENOUS
  Filled 2018-04-01: qty 2

## 2018-04-01 MED ORDER — ONDANSETRON HCL 4 MG/2ML IJ SOLN: 4.0000 mg | Freq: Four times a day (QID) | INTRAMUSCULAR | Status: DC | PRN

## 2018-04-01 MED ORDER — ALUM & MAG HYDROXIDE-SIMETH 200-200-20 MG/5ML PO SUSP: 30.0000 mL | Freq: Four times a day (QID) | ORAL | Status: DC | PRN

## 2018-04-01 MED ORDER — ACETAMINOPHEN 325 MG PO TABS: 650.0000 mg | ORAL_TABLET | Freq: Four times a day (QID) | ORAL | Status: DC | PRN

## 2018-04-01 MED ORDER — WARFARIN SODIUM 5 MG PO TABS
5.0000 mg | ORAL_TABLET | Freq: Once | ORAL | Status: AC
  Administered 2018-04-01: 5 mg via ORAL
  Filled 2018-04-01: qty 1

## 2018-04-01 MED ORDER — FUROSEMIDE 10 MG/ML IJ SOLN
60.0000 mg | Freq: Two times a day (BID) | INTRAMUSCULAR | Status: DC
  Administered 2018-04-01 – 2018-04-04 (×7): 60 mg via INTRAVENOUS
  Filled 2018-04-01 (×7): qty 6

## 2018-04-01 MED ORDER — WARFARIN - PHARMACIST DOSING INPATIENT
Freq: Every day | Status: DC
  Administered 2018-04-01 – 2018-04-02 (×2)

## 2018-04-01 NOTE — Progress Notes (Signed)
ANTICOAGULATION CONSULT NOTE - Preliminary  Pharmacy Consult for warfarin Indication: atrial fibrillation/flutter  Allergies  Allergen Reactions  . Penicillins     Has patient had a PCN reaction causing immediate rash, facial/tongue/throat swelling, SOB or lightheadedness with hypotension: Yes Has patient had a PCN reaction causing severe rash involving mucus membranes or skin necrosis: No Has patient had a PCN reaction that required hospitalization: No Has patient had a PCN reaction occurring within the last 10 years: No If all of the above answers are "NO", then may proceed with Cephalosporin use.    Patient Measurements: Height: 6' (182.9 cm) Weight: 211 lb 11.2 oz (96 kg) IBW/kg (Calculated) : 77.6 kg HEPARIN DW (KG): 96 kg   Vital Signs: Temp: 97.7 F (36.5 C) (12/18 2104) Temp Source: Oral (12/18 2104) BP: 164/88 (12/18 2104) Pulse Rate: 72 (12/18 2104)  Labs: Recent Labs    03/29/18 1509 03/31/18 1553  HGB 9.1* 9.5*  HCT 30.1* 31.7*  PLT 259 253  LABPROT 18.3* 22.0*  INR 1.54 1.95  CREATININE 1.02 0.70  TROPONINI <0.03  --    Estimated Creatinine Clearance: 87.1 mL/min (by C-G formula based on SCr of 0.7 mg/dL).  Medical History: Past Medical History:  Diagnosis Date  . Acute diastolic CHF (congestive heart failure) ([place]) 02/11/2017  . Atrial flutter ([place])    a. diagnosed in 02/2017. Rate-control strategy pursued.   Marland Kitchen BPH (benign prostatic hyperplasia)   . Chronic diastolic heart failure ([place])   . Hypertension   . Renal disorder    cyst on kidney    Medications:  Medications Prior to Admission  Medication Sig Dispense Refill Last Dose  . diltiazem (CARDIZEM CD) 120 MG 24 hr capsule Take 1 capsule (120 mg total) by mouth daily. 30 capsule 0 03/30/2018 at Unknown time  . finasteride (PROSCAR) 5 MG tablet Take 5 mg by mouth daily.   03/30/2018 at Unknown time  . furosemide (LASIX) 80 MG tablet Take 1 tablet (80 mg total) by mouth daily. 90 tablet 1  03/30/2018 at Unknown time  . losartan (COZAAR) 25 MG tablet Take 1 tablet (25 mg total) by mouth daily. 30 tablet 0 03/30/2018 at Unknown time  . metoprolol tartrate (LOPRESSOR) 100 MG tablet Take 1 tablet (100 mg total) by mouth 2 (two) times daily. 180 tablet 2 03/31/2018  . potassium chloride (K-DUR,KLOR-CON) 20 MEQ tablet Take 1 tablet (20 mEq total) by mouth daily. 30 tablet 0 03/30/2018 at Unknown time  . tamsulosin (FLOMAX) 0.4 MG CAPS capsule Take 0.4 mg by mouth daily.    03/30/2018 at Unknown time  . warfarin (COUMADIN) 5 MG tablet Take 1 tablet daily except 1/2 tablet on Mondays and Fridays or as directed (Patient taking differently: Take 2.5-5 mg by mouth See admin instructions. Take 1 tablet daily except 1/2 tablet on Mondays and Fridays or as directed) 45 tablet 3 03/30/2018 at 2000  . famotidine (PEPCID) 40 MG tablet Take 1 tablet (40 mg total) by mouth daily. 30 tablet 0     Assessment: Home coumadin dose 5 mg daily except 2.5 mg on Monday & Friday.  Pt's last dose was on 03/30/18.  INR on admission 1.95.  Goal of Therapy:  INR 2-3   Plan:  Warfarin 5 mg po x1 now Daily protimes Preliminary review of pertinent patient information completed.  Forestine Na clinical pharmacist will complete review during morning rounds to assess the patient and finalize treatment regimen.  Beryle Lathe, [facility] 04/01/2018,12:18 AM

## 2018-04-01 NOTE — Progress Notes (Signed)
ANTICOAGULATION CONSULT NOTE - Follow Up Consult  Pharmacy Consult for warfarin Indication: atrial fibrillation  Allergies  Allergen Reactions  . Penicillins     Has patient had a PCN reaction causing immediate rash, facial/tongue/throat swelling, SOB or lightheadedness with hypotension: Yes Has patient had a PCN reaction causing severe rash involving mucus membranes or skin necrosis: No Has patient had a PCN reaction that required hospitalization: No Has patient had a PCN reaction occurring within the last 10 years: No If all of the above answers are "NO", then may proceed with Cephalosporin use.    Patient Measurements: Height: 6' (182.9 cm) Weight: 204 lb 1.6 oz (92.6 kg) IBW/kg (Calculated) : 77.6   Vital Signs: Temp: 97.9 F (36.6 C) (12/19 0644) Temp Source: Oral (12/19 0644) BP: 120/57 (12/19 0644) Pulse Rate: 70 (12/19 0656)  Labs: Recent Labs    03/29/18 1509 03/31/18 1553 04/01/18 0454  HGB 9.1* 9.5* 8.4*  HCT 30.1* 31.7* 27.8*  PLT 259 253 225  LABPROT 18.3* 22.0* 21.9*  INR 1.54 1.95 1.94  CREATININE 1.02 0.70 0.72  TROPONINI <0.03  --   --     Estimated Creatinine Clearance: 79.5 mL/min (by C-G formula based on SCr of 0.72 mg/dL).   Medications:  Medications Prior to Admission  Medication Sig Dispense Refill Last Dose  . diltiazem (CARDIZEM CD) 120 MG 24 hr capsule Take 1 capsule (120 mg total) by mouth daily. 30 capsule 0 03/30/2018 at Unknown time  . finasteride (PROSCAR) 5 MG tablet Take 5 mg by mouth daily.   03/30/2018 at Unknown time  . furosemide (LASIX) 80 MG tablet Take 1 tablet (80 mg total) by mouth daily. 90 tablet 1 03/30/2018 at Unknown time  . losartan (COZAAR) 25 MG tablet Take 1 tablet (25 mg total) by mouth daily. 30 tablet 0 03/30/2018 at Unknown time  . metoprolol tartrate (LOPRESSOR) 100 MG tablet Take 1 tablet (100 mg total) by mouth 2 (two) times daily. 180 tablet 2 03/31/2018  . potassium chloride (K-DUR,KLOR-CON) 20 MEQ  tablet Take 1 tablet (20 mEq total) by mouth daily. 30 tablet 0 03/30/2018 at Unknown time  . tamsulosin (FLOMAX) 0.4 MG CAPS capsule Take 0.4 mg by mouth daily.    03/30/2018 at Unknown time  . warfarin (COUMADIN) 5 MG tablet Take 1 tablet daily except 1/2 tablet on Mondays and Fridays or as directed (Patient taking differently: Take 2.5-5 mg by mouth See admin instructions. Take 1 tablet daily except 1/2 tablet on Mondays and Fridays or as directed) 45 tablet 3 03/30/2018 at 2000  . famotidine (PEPCID) 40 MG tablet Take 1 tablet (40 mg total) by mouth daily. 30 tablet 0     Assessment: Home coumadin dose 5 mg daily except 2.5 mg on Monday & Friday.  Pt's last dose was on 03/30/18.  INR 1.94  Goal of Therapy:  INR 2-3 Monitor platelets by anticoagulation protocol: Yes   Plan:  Warfarin 5 mg x 1 dose. Monitor daily labs, INR, and s/s of bleeding  Ramond Craver 04/01/2018,7:42 AM

## 2018-04-01 NOTE — Progress Notes (Signed)
Central Telemetry called and stated that the patient just had an 18 beat run of SVT. Patient asymptomatic. Paged Dr. Carles Collet with the information, waiting for new orders.

## 2018-04-01 NOTE — Progress Notes (Addendum)
PROGRESS NOTE  Michael Stephens IRC:[id number] DOB: May 13, 1934 DOA: 03/31/2018 PCP: [facility]  Brief History:  83 year old male with a history of diastolic CHF, paroxysmal atrial fibrillation, atrial flutter, hypertension, BPH presenting with 4-day history of worsening shortness of breath that started on 03/26/2018.  Patient also has been complaining of some worsening of his lower extremity edema.  He denies any fevers, chills, cough, hemoptysis, nausea, vomiting, diarrhea.  He endorses compliance with his furosemide, but states that he has been drinking 8 cups of coffee, 4 cups of juice, and one 16 ounce bottle of water on a daily basis.  Patient states that he does not weigh himself on a daily basis, but his weight was 209 pounds approximately 1 week prior to his admission at home.  Upon presentation, the patient was noted to have a BNP of 691 with chest x-ray showing increased interstitial markings.  The patient was started on IV Lasix with good clinical effect.  Assessment/Plan: Acute respiratory failure with hypoxia -Secondary to pulmonary edema -Stable on 2 L -Wean oxygen back to room air for oxygen saturation greater than 92% -Personally reviewed chest x-ray--increased interstitial markings  Acute on chronic diastolic CHF -51/05/5850 echo EF 50-55%, decreased LV compliance, mild AS -Partly due to indiscretion with diet and fluid intake -Continue IV furosemide -Daily weights -Accurate I's and Os -Repeat echo -09/08/2017 discharge weight 201 pounds -02/19/2018 office weight--216 pounds  Essential hypertension -Continue losartan metoprolol tartrate  Atrial arrhythmias--paroxysmal atrial fibrillation, atrial flutter -Presently in sinus rhythm -Continue warfarin -Continue diltiazem CD and metoprolol -Personally reviewed EKG--sinus rhythm, nonspecific ST-T wave changes  BPH -Continue finasteride  Hyperlipidemia -Patient had myalgias on pravastatin which  was stopped by cardiology   Disposition Plan:   Home in 2-3 days  Family Communication:   Family at bedside  Consultants:  none  Code Status:  FULL   DVT Prophylaxis:  coumadin   Procedures: As Listed in Progress Note Above  Antibiotics: None      Subjective: Patient continues to have some dyspnea on exertion.  He continues to have lower extremity edema.  He denies any chest pain, fevers, chills, coughing, hemoptysis, nausea, vomiting, diarrhea, abdominal pain, dysuria, hematuria but there is no headache or neck pain.  Objective: Vitals:   03/31/18 2104 03/31/18 2104 04/01/18 0644 04/01/18 0656  BP:  (!) 164/88 (!) 120/57   Pulse:  72 (!) 51 70  Resp:  20 19   Temp:  97.7 F (36.5 C) 97.9 F (36.6 C)   TempSrc:  Oral Oral   SpO2:  97% 97%   Weight: 96 kg  92.6 kg   Height: 6' (1.829 m)       Intake/Output Summary (Last 24 hours) at 04/01/2018 0840 Last data filed at 04/01/2018 0757 Gross per 24 hour  Intake 190.99 ml  Output 600 ml  Net -409.01 ml   Weight change:  Exam:   General:  Pt is alert, follows commands appropriately, not in acute distress  HEENT: No icterus, No thrush, No neck mass, [place]/AT  Cardiovascular: RRR, S1/S2, no rubs, no gallops  Respiratory: Bilateral crackles.  No wheezing.  Abdomen: Soft/+BS, non tender, non distended, no guarding  Extremities: 1+ lower extremity edema, No lymphangitis, No petechiae, No rashes, no synovitis   Data Reviewed: I have personally reviewed following labs and imaging studies Basic Metabolic Panel: Recent Labs  Lab 03/29/18 1509 03/31/18 1553 04/01/18 0454  NA 139 139 137  K  4.2 4.1 4.6  CL 104 106 106  CO2 28 25 25   GLUCOSE 99 133* 161*  BUN 35* 22 22  CREATININE 1.02 0.70 0.72  CALCIUM 9.7 9.7 9.0  MG  --   --  2.6*   Liver Function Tests: Recent Labs  Lab 03/29/18 1509 03/31/18 1553  AST 30 19  ALT 39 30  ALKPHOS 57 57  BILITOT 0.9 1.7*  PROT 6.6 7.2  ALBUMIN 3.9 4.1    Recent Labs  Lab 03/29/18 1509  LIPASE 26   No results for input(s): AMMONIA in the last 168 hours. Coagulation Profile: Recent Labs  Lab 03/29/18 1509 03/31/18 1553 04/01/18 0454  INR 1.54 1.95 1.94   CBC: Recent Labs  Lab 03/29/18 1509 03/31/18 1553 04/01/18 0454  WBC 8.6 8.2 5.5  NEUTROABS 5.9 6.3  --   HGB 9.1* 9.5* 8.4*  HCT 30.1* 31.7* 27.8*  MCV 101.0* 100.3* 100.4*  PLT 259 253 225   Cardiac Enzymes: Recent Labs  Lab 03/29/18 1509  TROPONINI <0.03   BNP: Invalid input(s): POCBNP CBG: No results for input(s): GLUCAP in the last 168 hours. HbA1C: No results for input(s): HGBA1C in the last 72 hours. Urine analysis:    Component Value Date/Time   COLORURINE YELLOW 11/06/2017 0936   APPEARANCEUR HAZY (A) 11/06/2017 0936   LABSPEC 1.010 11/06/2017 0936   PHURINE 6.0 11/06/2017 0936   GLUCOSEU NEGATIVE 11/06/2017 0936   HGBUR SMALL (A) 11/06/2017 0936   BILIRUBINUR NEGATIVE 11/06/2017 0936   KETONESUR NEGATIVE 11/06/2017 0936   PROTEINUR NEGATIVE 11/06/2017 0936   NITRITE POSITIVE (A) 11/06/2017 0936   LEUKOCYTESUR LARGE (A) 11/06/2017 0936   Sepsis Labs: @LABRCNTIP (procalcitonin:4,lacticidven:4) )No results found for this or any previous visit (from the past 240 hour(s)).   Scheduled Meds: . diltiazem  120 mg Oral Daily  . famotidine  40 mg Oral Daily  . finasteride  5 mg Oral Daily  . furosemide  20 mg Intravenous Once  . furosemide  60 mg Intravenous BID  . losartan  25 mg Oral Daily  . metoprolol tartrate  100 mg Oral BID  . warfarin  5 mg Oral Once  . Warfarin - Pharmacist Dosing Inpatient   Does not apply q1800   Continuous Infusions: . sodium chloride 10 mL/hr at 03/31/18 1815    Procedures/Studies: Dg Chest 2 View  Result Date: 03/29/2018 CLINICAL DATA:  Upper and mid abdominal pain, swelling and distension. Shortness of breath for the past 3 days. EXAM: CHEST - 2 VIEW COMPARISON:  09/08/2017, 09/07/2017, 05/08/2017,  03/08/2017, 02/12/2017 and 02/09/2017. FINDINGS: The cardiac silhouette remains borderline enlarged. Small amount of increased density at the left lateral lung base. Small nodular densities in the right mid lung zone without significant change since 05/08/2017. The pulmonary vasculature remains mildly prominent. Unremarkable bones. IMPRESSION: 1. Small amount of atelectasis or scarring at the left lateral lung base. 2. Stable small nodular densities in the right mid lung zone. 3. Stable borderline cardiomegaly and mild pulmonary vascular congestion. Electronically Signed   By: Claudie Revering M.D.   On: 03/29/2018 15:06   Dg Chest Portable 1 View  Result Date: 03/31/2018 CLINICAL DATA:  Shortness of breath. EXAM: PORTABLE CHEST 1 VIEW COMPARISON:  03/29/2018 FINDINGS: Increased interstitial densities in both lungs, left side greater than right. Findings are suggestive for pulmonary edema. Heart size is upper limits of normal but stable. The trachea is midline. Negative for pneumothorax. IMPRESSION: Increased interstitial densities, particularly in the left lung. Findings are  suggestive for asymmetric pulmonary edema. Electronically Signed   By: Markus Daft M.D.   On: 03/31/2018 16:22   Dg Abd 2 Views  Result Date: 03/29/2018 CLINICAL DATA:  Dyspnea x3 days with nonproductive cough. EXAM: ABDOMEN - 2 VIEW COMPARISON:  09/07/2017 FINDINGS: Nonspecific bowel gas pattern with scattered air containing small and large bowel loops without definite mechanical bowel obstruction. No free air. No organomegaly nor radiopaque calculi. Cardiomegaly is identified with slightly confluent opacities at the left lung base. Pneumonia and/or atelectasis might account for this appearance. Thoracolumbar spondylosis. Osteoarthritis of the included hip joints. Acute osseous abnormality. No radiopaque calculi. IMPRESSION: 1. Nonspecific bowel gas pattern with scattered air containing small and large bowel loops. 2. Cardiomegaly. 3.  Slightly confluent opacities are noted of the included left lung base. Atelectasis, scarring or pneumonia might account for this appearance. Electronically Signed   By: Ashley Royalty M.D.   On: 03/29/2018 19:07    Orson Eva, DO  [facility] Pager [phone]  If 7PM-7AM, please contact night-coverage [email or web address] Password [email or web address] 04/01/2018, 8:40 AM   LOS: 1 day

## 2018-04-01 NOTE — Progress Notes (Signed)
*  PRELIMINARY RESULTS* Echocardiogram 2D Echocardiogram has been performed.  Michael Stephens 04/01/2018, 2:51 PM

## 2018-04-01 NOTE — Progress Notes (Signed)
Nutrition Brief Note  RD received V.O. consult for HF ed this morning in Progression Rounds.  Attempted to see pt 2x today, but both times pt gathered/tallking with 4-5 other people.   Will try again tomorrow AM.   Burtis Junes RD, LDN, CNSC Clinical Nutrition Available Tues-Sat via Pager: [phone] 04/01/2018 3:44 PM

## 2018-04-02 DIAGNOSIS — I4892 Unspecified atrial flutter: Secondary | ICD-10-CM

## 2018-04-02 LAB — BASIC METABOLIC PANEL
Anion gap: 7 (ref 5–15)
BUN: 34 mg/dL — ABNORMAL HIGH (ref 8–23)
CO2: 27 mmol/L (ref 22–32)
Calcium: 9.1 mg/dL (ref 8.9–10.3)
Chloride: 102 mmol/L (ref 98–111)
Creatinine, Ser: 0.87 mg/dL (ref 0.61–1.24)
GFR calc Af Amer: >60 mL/min (ref >60)
GFR calc non Af Amer: >60 mL/min (ref >60)
Glucose, Bld: 103 mg/dL — ABNORMAL HIGH (ref 70–99)
POTASSIUM: 4.1 mmol/L (ref 3.5–5.1)
Sodium: 136 mmol/L (ref 135–145)

## 2018-04-02 LAB — PROTIME-INR
INR: 2.37
Prothrombin Time: 25.6 seconds — ABNORMAL HIGH (ref 11.4–15.2)

## 2018-04-02 MED ORDER — VITAMIN B-12 1000 MCG PO TABS
500.0000 ug | ORAL_TABLET | Freq: Every day | ORAL | Status: DC
  Administered 2018-04-02 – 2018-04-04 (×3): 500 ug via ORAL
  Filled 2018-04-02 (×3): qty 1

## 2018-04-02 MED ORDER — WARFARIN SODIUM 2.5 MG PO TABS
2.5000 mg | ORAL_TABLET | Freq: Once | ORAL | Status: AC
  Administered 2018-04-02: 2.5 mg via ORAL
  Filled 2018-04-02: qty 1

## 2018-04-02 MED ORDER — SODIUM CHLORIDE 0.9 % IV SOLN
510.0000 mg | Freq: Once | INTRAVENOUS | Status: AC
  Administered 2018-04-02: 510 mg via INTRAVENOUS
  Filled 2018-04-02: qty 17

## 2018-04-02 NOTE — Progress Notes (Signed)
PROGRESS NOTE  Michael Stephens RXV:[id number] DOB: 09/11/1934 DOA: 03/31/2018 PCP: [facility]   Brief History:  83 year old male with a history of diastolic CHF, paroxysmal atrial fibrillation, atrial flutter, hypertension, BPH presenting with 4-day history of worsening shortness of breath that started on 03/26/2018.  Patient also has been complaining of some worsening of his lower extremity edema.  He denies any fevers, chills, cough, hemoptysis, nausea, vomiting, diarrhea.  He endorses compliance with his furosemide, but states that he has been drinking 8 cups of coffee, 4 cups of juice, and one 16 ounce bottle of water on a daily basis.  Patient states that he does not weigh himself on a daily basis, but his weight was 209 pounds approximately 1 week prior to his admission at home.  Upon presentation, the patient was noted to have a BNP of 691 with chest x-ray showing increased interstitial markings.  The patient was started on IV Lasix with good clinical effect.  Assessment/Plan: Acute respiratory failure with hypoxia -Secondary to pulmonary edema -Stable on 2 L>>> room air -Wean oxygen back to room air for oxygen saturation greater than 92% -Personally reviewed chest x-ray--increased interstitial markings  Acute on chronic diastolic CHF -50/93/2671 echo EF 50-55%, decreased LV compliance, mild AS -Partly due to indiscretion with diet and fluid intake -Continue IV furosemide -Daily weights--inaccurate -Accurate I's and Os--incomplete -04/01/2018 echo--EF 55-60%, no WMA, mild left ear -09/08/2017 discharge weight 201 pounds -02/19/2018 office weight--216 pounds  Essential hypertension -Continue losartan metoprolol tartrate  Atrial arrhythmias--paroxysmal atrial fibrillation, atrial flutter -Presently in sinus rhythm -Continue warfarin -holding  diltiazem CD and metoprolol due to bradycardia -plan to restart metoprolol at lower dose -Personally reviewed  EKG--sinus rhythm, nonspecific ST-T wave changes -Suspect the patient may have a degree of sick sinus syndrome  BPH -Continue finasteride  Hyperlipidemia -Patient had myalgias on pravastatin which was stopped by cardiology   Disposition Plan:   Home 12/21 or 12/22 Family Communication: no  Family at bedside  Consultants:  none  Code Status:  FULL   DVT Prophylaxis:  coumadin   Procedures: As Listed in Progress Note Above  Antibiotics: None    Subjective: Patient states that he is breathing better.  He still has some mild dyspnea on exertion but it is improving.  He denies any fevers, chills, chest pain, nausea, vomiting or diarrhea, bowel pain.  There is no dysuria or hematuria.  Objective: Vitals:   04/01/18 1413 04/01/18 2209 04/02/18 0628 04/02/18 1317  BP: 110/62 (!) 97/52 97/85 120/62  Pulse: (!) 46 (!) 41 (!) 41 (!) 58  Resp: 18 18 20 19   Temp: 97.6 F (36.4 C) 98.1 F (36.7 C) 97.6 F (36.4 C) 97.6 F (36.4 C)  TempSrc: Oral Oral Oral   SpO2: 95% 100% 99% 97%  Weight:      Height:        Intake/Output Summary (Last 24 hours) at 04/02/2018 1824 Last data filed at 04/02/2018 1730 Gross per 24 hour  Intake 836.15 ml  Output 350 ml  Net 486.15 ml   Weight change:  Exam:   General:  Pt is alert, follows commands appropriately, not in acute distress  HEENT: No icterus, No thrush, No neck mass, /AT  Cardiovascular: RRR, S1/S2, no rubs, no gallops  Respiratory: Fine bibasilar crackles  Abdomen: Soft/+BS, non tender, non distended, no guarding  Extremities: Trace lower extremity edema, No lymphangitis, No petechiae, No rashes, no synovitis   Data  Reviewed: I have personally reviewed following labs and imaging studies Basic Metabolic Panel: Recent Labs  Lab 03/29/18 1509 03/31/18 1553 04/01/18 0454 04/02/18 0516  NA 139 139 137 136  K 4.2 4.1 4.6 4.1  CL 104 106 106 102  CO2 28 25 25 27   GLUCOSE 99 133* 161* 103*  BUN 35*  22 22 34*  CREATININE 1.02 0.70 0.72 0.87  CALCIUM 9.7 9.7 9.0 9.1  MG  --   --  2.6*  --    Liver Function Tests: Recent Labs  Lab 03/29/18 1509 03/31/18 1553 04/01/18 0454  AST 30 19 20   ALT 39 30 26  ALKPHOS 57 57 47  BILITOT 0.9 1.7* 0.9  PROT 6.6 7.2 6.1*  ALBUMIN 3.9 4.1 3.4*   Recent Labs  Lab 03/29/18 1509  LIPASE 26   No results for input(s): AMMONIA in the last 168 hours. Coagulation Profile: Recent Labs  Lab 03/29/18 1509 03/31/18 1553 04/01/18 0454 04/02/18 0516  INR 1.54 1.95 1.94 2.37   CBC: Recent Labs  Lab 03/29/18 1509 03/31/18 1553 04/01/18 0454  WBC 8.6 8.2 5.5  NEUTROABS 5.9 6.3  --   HGB 9.1* 9.5* 8.4*  HCT 30.1* 31.7* 27.8*  MCV 101.0* 100.3* 100.4*  PLT 259 253 225   Cardiac Enzymes: Recent Labs  Lab 03/29/18 1509  TROPONINI <0.03   BNP: Invalid input(s): POCBNP CBG: No results for input(s): GLUCAP in the last 168 hours. HbA1C: Recent Labs    04/01/18 0454  HGBA1C 5.1   Urine analysis:    Component Value Date/Time   COLORURINE YELLOW 11/06/2017 0936   APPEARANCEUR HAZY (A) 11/06/2017 0936   LABSPEC 1.010 11/06/2017 0936   PHURINE 6.0 11/06/2017 0936   GLUCOSEU NEGATIVE 11/06/2017 0936   HGBUR SMALL (A) 11/06/2017 0936   BILIRUBINUR NEGATIVE 11/06/2017 0936   KETONESUR NEGATIVE 11/06/2017 0936   PROTEINUR NEGATIVE 11/06/2017 0936   NITRITE POSITIVE (A) 11/06/2017 0936   LEUKOCYTESUR LARGE (A) 11/06/2017 0936   Sepsis Labs: @LABRCNTIP (procalcitonin:4,lacticidven:4) )No results found for this or any previous visit (from the past 240 hour(s)).   Scheduled Meds: . diltiazem  120 mg Oral Daily  . famotidine  40 mg Oral Daily  . finasteride  5 mg Oral Daily  . furosemide  60 mg Intravenous BID  . losartan  25 mg Oral Daily  . metoprolol tartrate  100 mg Oral BID  . vitamin B-12  500 mcg Oral Daily  . Warfarin - Pharmacist Dosing Inpatient   Does not apply q1800   Continuous Infusions: . sodium chloride 10  mL/hr at 03/31/18 1815    Procedures/Studies: Dg Chest 2 View  Result Date: 03/29/2018 CLINICAL DATA:  Upper and mid abdominal pain, swelling and distension. Shortness of breath for the past 3 days. EXAM: CHEST - 2 VIEW COMPARISON:  09/08/2017, 09/07/2017, 05/08/2017, 03/08/2017, 02/12/2017 and 02/09/2017. FINDINGS: The cardiac silhouette remains borderline enlarged. Small amount of increased density at the left lateral lung base. Small nodular densities in the right mid lung zone without significant change since 05/08/2017. The pulmonary vasculature remains mildly prominent. Unremarkable bones. IMPRESSION: 1. Small amount of atelectasis or scarring at the left lateral lung base. 2. Stable small nodular densities in the right mid lung zone. 3. Stable borderline cardiomegaly and mild pulmonary vascular congestion. Electronically Signed   By: Claudie Revering M.D.   On: 03/29/2018 15:06   Dg Chest Portable 1 View  Result Date: 03/31/2018 CLINICAL DATA:  Shortness of breath. EXAM: PORTABLE CHEST 1  VIEW COMPARISON:  03/29/2018 FINDINGS: Increased interstitial densities in both lungs, left side greater than right. Findings are suggestive for pulmonary edema. Heart size is upper limits of normal but stable. The trachea is midline. Negative for pneumothorax. IMPRESSION: Increased interstitial densities, particularly in the left lung. Findings are suggestive for asymmetric pulmonary edema. Electronically Signed   By: Markus Daft M.D.   On: 03/31/2018 16:22   Dg Abd 2 Views  Result Date: 03/29/2018 CLINICAL DATA:  Dyspnea x3 days with nonproductive cough. EXAM: ABDOMEN - 2 VIEW COMPARISON:  09/07/2017 FINDINGS: Nonspecific bowel gas pattern with scattered air containing small and large bowel loops without definite mechanical bowel obstruction. No free air. No organomegaly nor radiopaque calculi. Cardiomegaly is identified with slightly confluent opacities at the left lung base. Pneumonia and/or atelectasis might  account for this appearance. Thoracolumbar spondylosis. Osteoarthritis of the included hip joints. Acute osseous abnormality. No radiopaque calculi. IMPRESSION: 1. Nonspecific bowel gas pattern with scattered air containing small and large bowel loops. 2. Cardiomegaly. 3. Slightly confluent opacities are noted of the included left lung base. Atelectasis, scarring or pneumonia might account for this appearance. Electronically Signed   By: Ashley Royalty M.D.   On: 03/29/2018 19:07    Orson Eva, DO  [facility] Pager [phone]  If 7PM-7AM, please contact night-coverage [email or web address] Password [email or web address] 04/02/2018, 6:24 PM   LOS: 2 days

## 2018-04-02 NOTE — Care Management Important Message (Signed)
Important Message  Patient Details  Name: Curly Mackowski MRN: [id number] Date of Birth: 1934-09-13   Medicare Important Message Given:  Yes    Shelda Altes 04/02/2018, 1:51 PM

## 2018-04-02 NOTE — Progress Notes (Signed)
Patients HR is currently in the low 60s, has been in the 30s and 40s all night per central telemetry. Metoprolol and dilitiazem held at this time and Dr. Carles Collet informed. Waiting for reply.

## 2018-04-02 NOTE — Plan of Care (Signed)
Nutrition Education Note  RD consulted for nutrition education regarding onset CHF. Patient spouse is questioning "if he really has CHF" diagnosis. RD provided "Low Sodium Nutrition Therapy" handout from the Academy of [employer].   Reviewed patient's dietary recall. Patient consumes regular diet with excess fluid intake per staff report. Usual breakfast eggs, bacon, grits, juice and coffee. Lunch is often meals on wheels which a family member shares with him. Dinner varies sometimes they cook or may just have a sandwich- examples are ham or liver pudding. We reviewed alternate options which contain less sodium, encouraged change to low sodium [place] bacon and emphasized label reading. Fluid goals < 2 liters daily as a general guide. If MD has alternate goal -please communicate to patient with discharge instructions.  Provided examples on ways to decrease sodium intake in diet. Discouraged intake of processed foods and use of salt shaker. Encouraged fresh fruits and vegetables as well as whole grain sources of carbohydrates to maximize fiber intake.   RD discussed why it is important for patient to adhere to diet recommendations, and emphasized the role of fluids, foods to avoid, and importance of weighing self daily. Teach back method used.  Expect fair compliance.  Body mass index is 27.68 kg/m. Pt meets criteria for overweight based on current BMI.  Current diet order is Heart Healthy, patient is consuming approximately 75-100% of meals at this time.   Labs and medications reviewed.  BMP Latest Ref Rng & Units 04/02/2018 04/01/2018 03/31/2018  Glucose 70 - 99 mg/dL 103(H) 161(H) 133(H)  BUN 8 - 23 mg/dL 34(H) 22 22  Creatinine 0.61 - 1.24 mg/dL 0.87 0.72 0.70  Sodium 135 - 145 mmol/L 136 137 139  Potassium 3.5 - 5.1 mmol/L 4.1 4.6 4.1  Chloride 98 - 111 mmol/L 102 106 106  CO2 22 - 32 mmol/L 27 25 25   Calcium 8.9 - 10.3 mg/dL 9.1 9.0 9.7     No further nutrition  interventions warranted at this time. RD contact information provided. If additional nutrition issues arise, please re-consult RD.   Colman Cater MS,RD,CSG,LDN Office: [phone] Pager: [phone]

## 2018-04-02 NOTE — Progress Notes (Signed)
ANTICOAGULATION CONSULT NOTE - Follow Up Consult  Pharmacy Consult for warfarin Indication: atrial fibrillation  Allergies  Allergen Reactions  . Penicillins     Has patient had a PCN reaction causing immediate rash, facial/tongue/throat swelling, SOB or lightheadedness with hypotension: Yes Has patient had a PCN reaction causing severe rash involving mucus membranes or skin necrosis: No Has patient had a PCN reaction that required hospitalization: No Has patient had a PCN reaction occurring within the last 10 years: No If all of the above answers are "NO", then may proceed with Cephalosporin use.    Patient Measurements: Height: 6' (182.9 cm) Weight: 204 lb 1.6 oz (92.6 kg) IBW/kg (Calculated) : 77.6   Vital Signs: Temp: 97.6 F (36.4 C) (12/20 0628) Temp Source: Oral (12/20 0628) BP: 97/85 (12/20 0628) Pulse Rate: 41 (12/20 0628)  Labs: Recent Labs    03/31/18 1553 04/01/18 0454 04/02/18 0516  HGB 9.5* 8.4*  --   HCT 31.7* 27.8*  --   PLT 253 225  --   LABPROT 22.0* 21.9* 25.6*  INR 1.95 1.94 2.37  CREATININE 0.70 0.72 0.87    Estimated Creatinine Clearance: 73.1 mL/min (by C-G formula based on SCr of 0.87 mg/dL).   Medications:  Medications Prior to Admission  Medication Sig Dispense Refill Last Dose  . diltiazem (CARDIZEM CD) 120 MG 24 hr capsule Take 1 capsule (120 mg total) by mouth daily. 30 capsule 0 03/30/2018 at Unknown time  . finasteride (PROSCAR) 5 MG tablet Take 5 mg by mouth daily.   03/30/2018 at Unknown time  . furosemide (LASIX) 80 MG tablet Take 1 tablet (80 mg total) by mouth daily. 90 tablet 1 03/30/2018 at Unknown time  . losartan (COZAAR) 25 MG tablet Take 1 tablet (25 mg total) by mouth daily. 30 tablet 0 03/30/2018 at Unknown time  . metoprolol tartrate (LOPRESSOR) 100 MG tablet Take 1 tablet (100 mg total) by mouth 2 (two) times daily. 180 tablet 2 03/31/2018  . potassium chloride (K-DUR,KLOR-CON) 20 MEQ tablet Take 1 tablet (20 mEq  total) by mouth daily. 30 tablet 0 03/30/2018 at Unknown time  . tamsulosin (FLOMAX) 0.4 MG CAPS capsule Take 0.4 mg by mouth daily.    03/30/2018 at Unknown time  . warfarin (COUMADIN) 5 MG tablet Take 1 tablet daily except 1/2 tablet on Mondays and Fridays or as directed (Patient taking differently: Take 2.5-5 mg by mouth See admin instructions. Take 1 tablet daily except 1/2 tablet on Mondays and Fridays or as directed) 45 tablet 3 03/30/2018 at 2000  . famotidine (PEPCID) 40 MG tablet Take 1 tablet (40 mg total) by mouth daily. 30 tablet 0     Assessment: Patient on chronic coumadin for afib. Pharmacy asked to dose coumadin. INR is therapeutic today. Home coumadin dose 5 mg daily except 2.5 mg on Monday & Friday.    Goal of Therapy:  INR 2-3 Monitor platelets by anticoagulation protocol: Yes   Plan:  Warfarin 2.5 mg x 1 dose. Monitor daily labs, INR, and s/s of bleeding  Isac Sarna, BS [place]ena [place], BCPS Clinical Pharmacist Pager [phone] 04/02/2018,12:54 PM

## 2018-04-03 LAB — PROTIME-INR
INR: 2.43
Prothrombin Time: 26.1 seconds — ABNORMAL HIGH (ref 11.4–15.2)

## 2018-04-03 LAB — BASIC METABOLIC PANEL
Anion gap: 7 (ref 5–15)
BUN: 26 mg/dL — AB (ref 8–23)
CO2: 29 mmol/L (ref 22–32)
Calcium: 9 mg/dL (ref 8.9–10.3)
Chloride: 102 mmol/L (ref 98–111)
Creatinine, Ser: 0.82 mg/dL (ref 0.61–1.24)
GFR calc Af Amer: >60 mL/min (ref >60)
GFR calc non Af Amer: >60 mL/min (ref >60)
GLUCOSE: 133 mg/dL — AB (ref 70–99)
Potassium: 3.7 mmol/L (ref 3.5–5.1)
Sodium: 138 mmol/L (ref 135–145)

## 2018-04-03 LAB — OCCULT BLOOD X 1 CARD TO LAB, STOOL: Fecal Occult Bld: NEGATIVE

## 2018-04-03 MED ORDER — WARFARIN SODIUM 5 MG PO TABS
5.0000 mg | ORAL_TABLET | Freq: Once | ORAL | Status: AC
  Administered 2018-04-03: 5 mg via ORAL
  Filled 2018-04-03: qty 1

## 2018-04-03 MED ORDER — METOPROLOL TARTRATE 25 MG PO TABS
25.0000 mg | ORAL_TABLET | Freq: Two times a day (BID) | ORAL | Status: DC
  Administered 2018-04-03 – 2018-04-04 (×2): 25 mg via ORAL
  Filled 2018-04-03 (×2): qty 1

## 2018-04-03 NOTE — Progress Notes (Signed)
PROGRESS NOTE  Michael Stephens RCB:[id number] DOB: July 12, 1934 DOA: 03/31/2018 PCP: [facility]  Brief History: 83 year old male with a history of diastolic CHF, paroxysmal atrial fibrillation, atrial flutter, hypertension, BPH presenting with 4-day history of worsening shortness of breath that started on 03/26/2018. Patient also has been complaining of some worsening of his lower extremity edema. He denies any fevers, chills, cough, hemoptysis, nausea, vomiting, diarrhea. He endorses compliance with his furosemide, but states that he has been drinking 8 cups of coffee, 4 cups of juice, and one 16 ounce bottle of water on a daily basis. Patient states that he does not weigh himself on a daily basis, but his weight was 209 pounds approximately 1 week prior to his admission at home. Upon presentation, the patient was noted to have a BNP of 691 with chest x-ray showing increased interstitial markings. The patient was started on IV Lasix with good clinical effect.  Assessment/Plan: Acute respiratory failure with hypoxia -Secondary to pulmonary edema -Stable on 2 L>>> room air -Wean oxygen back to room air for oxygen saturation greater than 92%  Acute on chronic diastolic CHF -80/32/1224 echo EF 50-55%, decreased LV compliance, mildAS -due to indiscretion with diet and fluid intake -Symptomatically improved, but remained fluid overloaded -Continue IV furosemide -Daily weights--inaccurate -Accurate I's andOs--incomplete -04/01/2018 echo--EF 55-60%, no WMA, mild left ear -09/08/2017 discharge weight 201 pounds -02/19/2018 office weight--216 pounds  Essential hypertension -Continue losartan  -metoprolol tartrate and diltiazem was placed on hold at last 24 hours secondary to bradycardia -Restart metoprolol tartrate at lower dose  Atrial arrhythmias--paroxysmal atrial fibrillation, atrial flutter -Presently in sinus rhythm -Continue warfarin -holding   diltiazem CD and metoprolol due to bradycardia -restart metoprolol at lower dose -Personally reviewed EKG--sinus rhythm, nonspecific ST-T wave changes -Suspect the patient may have a degree of sick sinus syndrome  BPH -Continue finasteride  Hyperlipidemia -Patient had myalgias on pravastatin which was stopped by cardiology   Disposition Plan: Home 12/22 if medically stable Family Communication: noFamily at bedside  Consultants:none  Code Status: FULL   DVT Prophylaxis:coumadin   Procedures: As Listed in Progress Note Above  Antibiotics: None     Subjective: Patient denies fevers, chills, headache, chest pain, dyspnea, nausea, vomiting, diarrhea, abdominal pain, dysuria, hematuria, hematochezia, and melena. He states that he was able to walk around the nurses station without any shortness of breath  Objective: Vitals:   04/02/18 1317 04/02/18 2141 04/03/18 0533 04/03/18 0533  BP: 120/62 123/64 (!) 141/60 (!) 141/60  Pulse: (!) 58 (!) 48 68   Resp: 19 19  19   Temp: 97.6 F (36.4 C) 98 F (36.7 C) 98.5 F (36.9 C) 98.5 F (36.9 C)  TempSrc:  Oral Oral Oral  SpO2: 97% 95% 99%   Weight:    93.9 kg  Height:        Intake/Output Summary (Last 24 hours) at 04/03/2018 1800 Last data filed at 04/03/2018 1300 Gross per 24 hour  Intake 480 ml  Output 2726 ml  Net -2246 ml   Weight change:  Exam:   General:  Pt is alert, follows commands appropriately, not in acute distress  HEENT: No icterus, No thrush, No neck mass, [place]/AT  Cardiovascular: RRR, S1/S2, no rubs, no gallops  Respiratory: CTA bilaterally, no wheezing, no crackles, no rhonchi  Abdomen: Soft/+BS, non tender, non distended, no guarding  Extremities: Trace lower extremity edema, No lymphangitis, No petechiae, No rashes, no synovitis   Data Reviewed:  I have personally reviewed following labs and imaging studies Basic Metabolic Panel: Recent Labs  Lab 03/29/18 1509  03/31/18 1553 04/01/18 0454 04/02/18 0516 04/03/18 0623  NA 139 139 137 136 138  K 4.2 4.1 4.6 4.1 3.7  CL 104 106 106 102 102  CO2 28 25 25 27 29   GLUCOSE 99 133* 161* 103* 133*  BUN 35* 22 22 34* 26*  CREATININE 1.02 0.70 0.72 0.87 0.82  CALCIUM 9.7 9.7 9.0 9.1 9.0  MG  --   --  2.6*  --   --    Liver Function Tests: Recent Labs  Lab 03/29/18 1509 03/31/18 1553 04/01/18 0454  AST 30 19 20   ALT 39 30 26  ALKPHOS 57 57 47  BILITOT 0.9 1.7* 0.9  PROT 6.6 7.2 6.1*  ALBUMIN 3.9 4.1 3.4*   Recent Labs  Lab 03/29/18 1509  LIPASE 26   No results for input(s): AMMONIA in the last 168 hours. Coagulation Profile: Recent Labs  Lab 03/29/18 1509 03/31/18 1553 04/01/18 0454 04/02/18 0516 04/03/18 0623  INR 1.54 1.95 1.94 2.37 2.43   CBC: Recent Labs  Lab 03/29/18 1509 03/31/18 1553 04/01/18 0454  WBC 8.6 8.2 5.5  NEUTROABS 5.9 6.3  --   HGB 9.1* 9.5* 8.4*  HCT 30.1* 31.7* 27.8*  MCV 101.0* 100.3* 100.4*  PLT 259 253 225   Cardiac Enzymes: Recent Labs  Lab 03/29/18 1509  TROPONINI <0.03   BNP: Invalid input(s): POCBNP CBG: No results for input(s): GLUCAP in the last 168 hours. HbA1C: Recent Labs    04/01/18 0454  HGBA1C 5.1   Urine analysis:    Component Value Date/Time   COLORURINE YELLOW 11/06/2017 0936   APPEARANCEUR HAZY (A) 11/06/2017 0936   LABSPEC 1.010 11/06/2017 0936   PHURINE 6.0 11/06/2017 0936   GLUCOSEU NEGATIVE 11/06/2017 0936   HGBUR SMALL (A) 11/06/2017 0936   BILIRUBINUR NEGATIVE 11/06/2017 0936   KETONESUR NEGATIVE 11/06/2017 0936   PROTEINUR NEGATIVE 11/06/2017 0936   NITRITE POSITIVE (A) 11/06/2017 0936   LEUKOCYTESUR LARGE (A) 11/06/2017 0936   Sepsis Labs: @LABRCNTIP (procalcitonin:4,lacticidven:4) )No results found for this or any previous visit (from the past 240 hour(s)).   Scheduled Meds: . famotidine  40 mg Oral Daily  . finasteride  5 mg Oral Daily  . furosemide  60 mg Intravenous BID  . losartan  25 mg Oral  Daily  . vitamin B-12  500 mcg Oral Daily  . warfarin  5 mg Oral Once  . Warfarin - Pharmacist Dosing Inpatient   Does not apply q1800   Continuous Infusions: . sodium chloride 10 mL/hr at 03/31/18 1815    Procedures/Studies: Dg Chest 2 View  Result Date: 03/29/2018 CLINICAL DATA:  Upper and mid abdominal pain, swelling and distension. Shortness of breath for the past 3 days. EXAM: CHEST - 2 VIEW COMPARISON:  09/08/2017, 09/07/2017, 05/08/2017, 03/08/2017, 02/12/2017 and 02/09/2017. FINDINGS: The cardiac silhouette remains borderline enlarged. Small amount of increased density at the left lateral lung base. Small nodular densities in the right mid lung zone without significant change since 05/08/2017. The pulmonary vasculature remains mildly prominent. Unremarkable bones. IMPRESSION: 1. Small amount of atelectasis or scarring at the left lateral lung base. 2. Stable small nodular densities in the right mid lung zone. 3. Stable borderline cardiomegaly and mild pulmonary vascular congestion. Electronically Signed   By: Claudie Revering M.D.   On: 03/29/2018 15:06   Dg Chest Portable 1 View  Result Date: 03/31/2018 CLINICAL DATA:  Shortness  of breath. EXAM: PORTABLE CHEST 1 VIEW COMPARISON:  03/29/2018 FINDINGS: Increased interstitial densities in both lungs, left side greater than right. Findings are suggestive for pulmonary edema. Heart size is upper limits of normal but stable. The trachea is midline. Negative for pneumothorax. IMPRESSION: Increased interstitial densities, particularly in the left lung. Findings are suggestive for asymmetric pulmonary edema. Electronically Signed   By: Markus Daft M.D.   On: 03/31/2018 16:22   Dg Abd 2 Views  Result Date: 03/29/2018 CLINICAL DATA:  Dyspnea x3 days with nonproductive cough. EXAM: ABDOMEN - 2 VIEW COMPARISON:  09/07/2017 FINDINGS: Nonspecific bowel gas pattern with scattered air containing small and large bowel loops without definite mechanical bowel  obstruction. No free air. No organomegaly nor radiopaque calculi. Cardiomegaly is identified with slightly confluent opacities at the left lung base. Pneumonia and/or atelectasis might account for this appearance. Thoracolumbar spondylosis. Osteoarthritis of the included hip joints. Acute osseous abnormality. No radiopaque calculi. IMPRESSION: 1. Nonspecific bowel gas pattern with scattered air containing small and large bowel loops. 2. Cardiomegaly. 3. Slightly confluent opacities are noted of the included left lung base. Atelectasis, scarring or pneumonia might account for this appearance. Electronically Signed   By: Ashley Royalty M.D.   On: 03/29/2018 19:07    Orson Eva, DO  [facility] Pager [phone]  If 7PM-7AM, please contact night-coverage [email or web address] Password [email or web address] 04/03/2018, 6:00 PM   LOS: 3 days

## 2018-04-03 NOTE — Progress Notes (Signed)
ANTICOAGULATION CONSULT NOTE - Follow Up Consult  Pharmacy Consult for warfarin Indication: atrial fibrillation  Allergies  Allergen Reactions  . Penicillins     Has patient had a PCN reaction causing immediate rash, facial/tongue/throat swelling, SOB or lightheadedness with hypotension: Yes Has patient had a PCN reaction causing severe rash involving mucus membranes or skin necrosis: No Has patient had a PCN reaction that required hospitalization: No Has patient had a PCN reaction occurring within the last 10 years: No If all of the above answers are "NO", then may proceed with Cephalosporin use.    Patient Measurements: Height: 6' (182.9 cm) Weight: 207 lb 0.2 oz (93.9 kg) IBW/kg (Calculated) : 77.6   Vital Signs: Temp: 98.5 F (36.9 C) (12/21 0533) Temp Source: Oral (12/21 0533) BP: 141/60 (12/21 0533) Pulse Rate: 68 (12/21 0533)  Labs: Recent Labs    03/31/18 1553 04/01/18 0454 04/02/18 0516 04/03/18 0623  HGB 9.5* 8.4*  --   --   HCT 31.7* 27.8*  --   --   PLT 253 225  --   --   LABPROT 22.0* 21.9* 25.6* 26.1*  INR 1.95 1.94 2.37 2.43  CREATININE 0.70 0.72 0.87 0.82    Estimated Creatinine Clearance: 84 mL/min (by C-G formula based on SCr of 0.82 mg/dL).   Medications:  Medications Prior to Admission  Medication Sig Dispense Refill Last Dose  . diltiazem (CARDIZEM CD) 120 MG 24 hr capsule Take 1 capsule (120 mg total) by mouth daily. 30 capsule 0 03/30/2018 at Unknown time  . finasteride (PROSCAR) 5 MG tablet Take 5 mg by mouth daily.   03/30/2018 at Unknown time  . furosemide (LASIX) 80 MG tablet Take 1 tablet (80 mg total) by mouth daily. 90 tablet 1 03/30/2018 at Unknown time  . losartan (COZAAR) 25 MG tablet Take 1 tablet (25 mg total) by mouth daily. 30 tablet 0 03/30/2018 at Unknown time  . metoprolol tartrate (LOPRESSOR) 100 MG tablet Take 1 tablet (100 mg total) by mouth 2 (two) times daily. 180 tablet 2 03/31/2018  . potassium chloride  (K-DUR,KLOR-CON) 20 MEQ tablet Take 1 tablet (20 mEq total) by mouth daily. 30 tablet 0 03/30/2018 at Unknown time  . tamsulosin (FLOMAX) 0.4 MG CAPS capsule Take 0.4 mg by mouth daily.    03/30/2018 at Unknown time  . warfarin (COUMADIN) 5 MG tablet Take 1 tablet daily except 1/2 tablet on Mondays and Fridays or as directed (Patient taking differently: Take 2.5-5 mg by mouth See admin instructions. Take 1 tablet daily except 1/2 tablet on Mondays and Fridays or as directed) 45 tablet 3 03/30/2018 at 2000  . famotidine (PEPCID) 40 MG tablet Take 1 tablet (40 mg total) by mouth daily. 30 tablet 0     Assessment: Patient on chronic coumadin for afib. Pharmacy asked to dose coumadin. INR is therapeutic today. Home coumadin dose 5 mg daily except 2.5 mg on Monday & Friday.    Goal of Therapy:  INR 2-3 Monitor platelets by anticoagulation protocol: Yes   Plan:  Warfarin 5 mg x 1 dose. Monitor daily labs, INR, and s/s of bleeding  Isac Sarna, BS Vena [place], BCPS Clinical Pharmacist Pager [phone] 04/03/2018,9:45 AM

## 2018-04-04 LAB — BASIC METABOLIC PANEL
Anion gap: 7 (ref 5–15)
BUN: 20 mg/dL (ref 8–23)
CO2: 28 mmol/L (ref 22–32)
Calcium: 9.3 mg/dL (ref 8.9–10.3)
Chloride: 104 mmol/L (ref 98–111)
Creatinine, Ser: 0.82 mg/dL (ref 0.61–1.24)
GFR calc Af Amer: >60 mL/min (ref >60)
Glucose, Bld: 92 mg/dL (ref 70–99)
Potassium: 4 mmol/L (ref 3.5–5.1)
Sodium: 139 mmol/L (ref 135–145)

## 2018-04-04 LAB — PROTIME-INR
INR: 2.15
Prothrombin Time: 23.7 seconds — ABNORMAL HIGH (ref 11.4–15.2)

## 2018-04-04 MED ORDER — FUROSEMIDE 80 MG PO TABS: 80.0000 mg | ORAL_TABLET | Freq: Every day | ORAL | Status: DC

## 2018-04-04 MED ORDER — CYANOCOBALAMIN 500 MCG PO TABS: 500.0000 ug | ORAL_TABLET | Freq: Every day | ORAL | 1 refills | Status: DC

## 2018-04-04 MED ORDER — METOPROLOL TARTRATE 25 MG PO TABS: 25.0000 mg | ORAL_TABLET | Freq: Two times a day (BID) | ORAL | 1 refills | Status: DC

## 2018-04-04 MED ORDER — WARFARIN SODIUM 5 MG PO TABS: 5.0000 mg | ORAL_TABLET | Freq: Once | ORAL | Status: DC

## 2018-04-04 NOTE — Discharge Summary (Signed)
Physician Discharge Summary  Michael Stephens DGL:[id number] DOB: 1934-07-23 DOA: 03/31/2018  PCP: [facility]  Admit date: 03/31/2018 Discharge date: 04/04/2018  Admitted From: Home Disposition:  Home   Recommendations for Outpatient Follow-up:  1. Follow up with PCP in 1-2 weeks 2. Please obtain BMP/CBC in one week   Discharge Condition: Stable CODE STATUS: DNR Diet recommendation: Heart Healthy    Brief/Interim Summary: 83 year old male with a history of diastolic CHF, paroxysmal atrial fibrillation, atrial flutter, hypertension, BPH presenting with 4-day history of worsening shortness of breath that started on 03/26/2018. Patient also has been complaining of some worsening of his lower extremity edema. He denies any fevers, chills, cough, hemoptysis, nausea, vomiting, diarrhea. He endorses compliance with his furosemide, but states that he has been drinking 8 cups of coffee, 4 cups of juice, and one 16 ounce bottle of water on a daily basis. Patient states that he does not weigh himself on a daily basis, but his weight was 209 pounds approximately 1 week prior to his admission at home.  In addition, the patient endorses dietary indiscretion. Upon presentation, the patient was noted to have a BNP of 691 with chest x-ray showing increased interstitial markings. The patient was started on IV Lasix with good clinical effect.  Discharge Diagnoses:  Acute respiratory failure with hypoxia -Secondary to pulmonary edema -Stable on 2 L>>>room air -Wean oxygen back to room air for oxygen saturation greater than 92%  Acute on chronic diastolic CHF -51/88/4166 echo EF 50-55%, decreased LV compliance, mildAS -due to indiscretion with diet and fluid intake -Symptomatically improved, but remained fluid overloaded -Continue IV furosemide>>> DC home with furosemide 80 mg po daily -Daily weights--inaccurate -Accurate I's andOs--incomplete -12/19/2019echo--EF 55-60%, no  WMA, mild left ear -09/08/2017 discharge weight 201 pounds -04/04/18 discharge weight 201.5 lbs -NEG 5.5 lbs for the admission  Essential hypertension -Continue losartan  -metoprolol tartrate and diltiazem was placed on hold due to HR 40-50 -Discontinue diltiazem -Restart metoprolol tartrate at lower dose  Atrial arrhythmias--paroxysmal atrial fibrillation, atrial flutter -Presently in sinus rhythm -Continue warfarin -holdingdiltiazem CD and metoprolol due to bradycardia -restartmetoprolol at lower dose -Personally reviewed EKG--sinus rhythm, nonspecific ST-T wave changes -Suspect the patient may have a degree of sick sinus syndrome -metoprolol tartrate and diltiazem was placed on hold due to HR 40-50 -Discontinue diltiazem -Restart metoprolol tartrate at lower dose  BPH -Continue finasteride  Hyperlipidemia -Patient had myalgias on pravastatin which was stopped by cardiology    Discharge Instructions   Allergies as of 04/04/2018      Reactions   Penicillins    Has patient had a PCN reaction causing immediate rash, facial/tongue/throat swelling, SOB or lightheadedness with hypotension: Yes Has patient had a PCN reaction causing severe rash involving mucus membranes or skin necrosis: No Has patient had a PCN reaction that required hospitalization: No Has patient had a PCN reaction occurring within the last 10 years: No If all of the above answers are "NO", then may proceed with Cephalosporin use.      Medication List    STOP taking these medications   diltiazem 120 MG 24 hr capsule Commonly known as:  CARDIZEM CD     TAKE these medications   famotidine 40 MG tablet Commonly known as:  PEPCID Take 1 tablet (40 mg total) by mouth daily.   finasteride 5 MG tablet Commonly known as:  PROSCAR Take 5 mg by mouth daily.   furosemide 80 MG tablet Commonly known as:  LASIX Take 1 tablet (80  mg total) by mouth daily.   losartan 25 MG tablet Commonly known  as:  COZAAR Take 1 tablet (25 mg total) by mouth daily.   metoprolol tartrate 25 MG tablet Commonly known as:  LOPRESSOR Take 1 tablet (25 mg total) by mouth 2 (two) times daily. What changed:    medication strength  how much to take   potassium chloride SA 20 MEQ tablet Commonly known as:  K-DUR,KLOR-CON Take 1 tablet (20 mEq total) by mouth daily.   tamsulosin 0.4 MG Caps capsule Commonly known as:  FLOMAX Take 0.4 mg by mouth daily.   vitamin B-12 500 MCG tablet Commonly known as:  CYANOCOBALAMIN Take 1 tablet (500 mcg total) by mouth daily. Start taking on:  April 05, 2018   warfarin 5 MG tablet Commonly known as:  COUMADIN Take as directed. If you are unsure how to take this medication, talk to your nurse or doctor. Original instructions:  Take 1 tablet daily except 1/2 tablet on Mondays and Fridays or as directed What changed:    how much to take  how to take this  when to take this       Allergies  Allergen Reactions  . Penicillins     Has patient had a PCN reaction causing immediate rash, facial/tongue/throat swelling, SOB or lightheadedness with hypotension: Yes Has patient had a PCN reaction causing severe rash involving mucus membranes or skin necrosis: No Has patient had a PCN reaction that required hospitalization: No Has patient had a PCN reaction occurring within the last 10 years: No If all of the above answers are "NO", then may proceed with Cephalosporin use.    Consultations:  none   Procedures/Studies: Dg Chest 2 View  Result Date: 03/29/2018 CLINICAL DATA:  Upper and mid abdominal pain, swelling and distension. Shortness of breath for the past 3 days. EXAM: CHEST - 2 VIEW COMPARISON:  09/08/2017, 09/07/2017, 05/08/2017, 03/08/2017, 02/12/2017 and 02/09/2017. FINDINGS: The cardiac silhouette remains borderline enlarged. Small amount of increased density at the left lateral lung base. Small nodular densities in the right mid lung zone  without significant change since 05/08/2017. The pulmonary vasculature remains mildly prominent. Unremarkable bones. IMPRESSION: 1. Small amount of atelectasis or scarring at the left lateral lung base. 2. Stable small nodular densities in the right mid lung zone. 3. Stable borderline cardiomegaly and mild pulmonary vascular congestion. Electronically Signed   By: Claudie Revering M.D.   On: 03/29/2018 15:06   Dg Chest Portable 1 View  Result Date: 03/31/2018 CLINICAL DATA:  Shortness of breath. EXAM: PORTABLE CHEST 1 VIEW COMPARISON:  03/29/2018 FINDINGS: Increased interstitial densities in both lungs, left side greater than right. Findings are suggestive for pulmonary edema. Heart size is upper limits of normal but stable. The trachea is midline. Negative for pneumothorax. IMPRESSION: Increased interstitial densities, particularly in the left lung. Findings are suggestive for asymmetric pulmonary edema. Electronically Signed   By: Markus Daft M.D.   On: 03/31/2018 16:22   Dg Abd 2 Views  Result Date: 03/29/2018 CLINICAL DATA:  Dyspnea x3 days with nonproductive cough. EXAM: ABDOMEN - 2 VIEW COMPARISON:  09/07/2017 FINDINGS: Nonspecific bowel gas pattern with scattered air containing small and large bowel loops without definite mechanical bowel obstruction. No free air. No organomegaly nor radiopaque calculi. Cardiomegaly is identified with slightly confluent opacities at the left lung base. Pneumonia and/or atelectasis might account for this appearance. Thoracolumbar spondylosis. Osteoarthritis of the included hip joints. Acute osseous abnormality. No radiopaque calculi. IMPRESSION: 1.  Nonspecific bowel gas pattern with scattered air containing small and large bowel loops. 2. Cardiomegaly. 3. Slightly confluent opacities are noted of the included left lung base. Atelectasis, scarring or pneumonia might account for this appearance. Electronically Signed   By: Ashley Royalty M.D.   On: 03/29/2018 19:07          Discharge Exam: Vitals:   04/03/18 2139 04/04/18 0538  BP: 115/70 135/65  Pulse: 85 65  Resp:    Temp: 98.3 F (36.8 C) 99.3 F (37.4 C)  SpO2: 93% 93%   Vitals:   04/03/18 0533 04/03/18 1300 04/03/18 2139 04/04/18 0538  BP: (!) 141/60 140/60 115/70 135/65  Pulse:   85 65  Resp: 19     Temp: 98.5 F (36.9 C)  98.3 F (36.8 C) 99.3 F (37.4 C)  TempSrc: Oral  Oral Oral  SpO2:  100% 93% 93%  Weight: 93.9 kg   91.4 kg  Height:        General: Pt is alert, awake, not in acute distress Cardiovascular: RRR, S1/S2 +, no rubs, no gallops Respiratory: CTA bilaterally, no wheezing, no rhonchi Abdominal: Soft, NT, ND, bowel sounds + Extremities: no edema, no cyanosis   The results of significant diagnostics from this hospitalization (including imaging, microbiology, ancillary and laboratory) are listed below for reference.    Significant Diagnostic Studies: Dg Chest 2 View  Result Date: 03/29/2018 CLINICAL DATA:  Upper and mid abdominal pain, swelling and distension. Shortness of breath for the past 3 days. EXAM: CHEST - 2 VIEW COMPARISON:  09/08/2017, 09/07/2017, 05/08/2017, 03/08/2017, 02/12/2017 and 02/09/2017. FINDINGS: The cardiac silhouette remains borderline enlarged. Small amount of increased density at the left lateral lung base. Small nodular densities in the right mid lung zone without significant change since 05/08/2017. The pulmonary vasculature remains mildly prominent. Unremarkable bones. IMPRESSION: 1. Small amount of atelectasis or scarring at the left lateral lung base. 2. Stable small nodular densities in the right mid lung zone. 3. Stable borderline cardiomegaly and mild pulmonary vascular congestion. Electronically Signed   By: Claudie Revering M.D.   On: 03/29/2018 15:06   Dg Chest Portable 1 View  Result Date: 03/31/2018 CLINICAL DATA:  Shortness of breath. EXAM: PORTABLE CHEST 1 VIEW COMPARISON:  03/29/2018 FINDINGS: Increased interstitial  densities in both lungs, left side greater than right. Findings are suggestive for pulmonary edema. Heart size is upper limits of normal but stable. The trachea is midline. Negative for pneumothorax. IMPRESSION: Increased interstitial densities, particularly in the left lung. Findings are suggestive for asymmetric pulmonary edema. Electronically Signed   By: Markus Daft M.D.   On: 03/31/2018 16:22   Dg Abd 2 Views  Result Date: 03/29/2018 CLINICAL DATA:  Dyspnea x3 days with nonproductive cough. EXAM: ABDOMEN - 2 VIEW COMPARISON:  09/07/2017 FINDINGS: Nonspecific bowel gas pattern with scattered air containing small and large bowel loops without definite mechanical bowel obstruction. No free air. No organomegaly nor radiopaque calculi. Cardiomegaly is identified with slightly confluent opacities at the left lung base. Pneumonia and/or atelectasis might account for this appearance. Thoracolumbar spondylosis. Osteoarthritis of the included hip joints. Acute osseous abnormality. No radiopaque calculi. IMPRESSION: 1. Nonspecific bowel gas pattern with scattered air containing small and large bowel loops. 2. Cardiomegaly. 3. Slightly confluent opacities are noted of the included left lung base. Atelectasis, scarring or pneumonia might account for this appearance. Electronically Signed   By: Ashley Royalty M.D.   On: 03/29/2018 19:07     Microbiology: No results found  for this or any previous visit (from the past 240 hour(s)).   Labs: Basic Metabolic Panel: Recent Labs  Lab 03/31/18 1553 04/01/18 0454 04/02/18 0516 04/03/18 0623 04/04/18 0617  NA 139 137 136 138 139  K 4.1 4.6 4.1 3.7 4.0  CL 106 106 102 102 104  CO2 25 25 27 29 28   GLUCOSE 133* 161* 103* 133* 92  BUN 22 22 34* 26* 20  CREATININE 0.70 0.72 0.87 0.82 0.82  CALCIUM 9.7 9.0 9.1 9.0 9.3  MG  --  2.6*  --   --   --    Liver Function Tests: Recent Labs  Lab 03/29/18 1509 03/31/18 1553 04/01/18 0454  AST 30 19 20   ALT 39 30 26   ALKPHOS 57 57 47  BILITOT 0.9 1.7* 0.9  PROT 6.6 7.2 6.1*  ALBUMIN 3.9 4.1 3.4*   Recent Labs  Lab 03/29/18 1509  LIPASE 26   No results for input(s): AMMONIA in the last 168 hours. CBC: Recent Labs  Lab 03/29/18 1509 03/31/18 1553 04/01/18 0454  WBC 8.6 8.2 5.5  NEUTROABS 5.9 6.3  --   HGB 9.1* 9.5* 8.4*  HCT 30.1* 31.7* 27.8*  MCV 101.0* 100.3* 100.4*  PLT 259 253 225   Cardiac Enzymes: Recent Labs  Lab 03/29/18 1509  TROPONINI <0.03   BNP: Invalid input(s): POCBNP CBG: No results for input(s): GLUCAP in the last 168 hours.  Time coordinating discharge:  36 minutes  Signed:  Orson Eva, DO [facility] Pager: [phone] 04/04/2018, 11:08 AM

## 2018-04-04 NOTE — Progress Notes (Signed)
ANTICOAGULATION CONSULT NOTE - Follow Up Consult  Pharmacy Consult for warfarin Indication: atrial fibrillation  Allergies  Allergen Reactions  . Penicillins     Has patient had a PCN reaction causing immediate rash, facial/tongue/throat swelling, SOB or lightheadedness with hypotension: Yes Has patient had a PCN reaction causing severe rash involving mucus membranes or skin necrosis: No Has patient had a PCN reaction that required hospitalization: No Has patient had a PCN reaction occurring within the last 10 years: No If all of the above answers are "NO", then may proceed with Cephalosporin use.    Patient Measurements: Height: 6' (182.9 cm) Weight: 201 lb 8 oz (91.4 kg) IBW/kg (Calculated) : 77.6   Vital Signs: Temp: 99.3 F (37.4 C) (12/22 0538) Temp Source: Oral (12/22 0538) BP: 135/65 (12/22 0538) Pulse Rate: 65 (12/22 0538)  Labs: Recent Labs    04/02/18 0516 04/03/18 0623 04/04/18 0617  LABPROT 25.6* 26.1* 23.7*  INR 2.37 2.43 2.15  CREATININE 0.87 0.82 0.82    Estimated Creatinine Clearance: 77.5 mL/min (by C-G formula based on SCr of 0.82 mg/dL).   Medications:  Medications Prior to Admission  Medication Sig Dispense Refill Last Dose  . diltiazem (CARDIZEM CD) 120 MG 24 hr capsule Take 1 capsule (120 mg total) by mouth daily. 30 capsule 0 03/30/2018 at Unknown time  . finasteride (PROSCAR) 5 MG tablet Take 5 mg by mouth daily.   03/30/2018 at Unknown time  . furosemide (LASIX) 80 MG tablet Take 1 tablet (80 mg total) by mouth daily. 90 tablet 1 03/30/2018 at Unknown time  . losartan (COZAAR) 25 MG tablet Take 1 tablet (25 mg total) by mouth daily. 30 tablet 0 03/30/2018 at Unknown time  . metoprolol tartrate (LOPRESSOR) 100 MG tablet Take 1 tablet (100 mg total) by mouth 2 (two) times daily. 180 tablet 2 03/31/2018  . potassium chloride (K-DUR,KLOR-CON) 20 MEQ tablet Take 1 tablet (20 mEq total) by mouth daily. 30 tablet 0 03/30/2018 at Unknown time  .  tamsulosin (FLOMAX) 0.4 MG CAPS capsule Take 0.4 mg by mouth daily.    03/30/2018 at Unknown time  . warfarin (COUMADIN) 5 MG tablet Take 1 tablet daily except 1/2 tablet on Mondays and Fridays or as directed (Patient taking differently: Take 2.5-5 mg by mouth See admin instructions. Take 1 tablet daily except 1/2 tablet on Mondays and Fridays or as directed) 45 tablet 3 03/30/2018 at 2000  . famotidine (PEPCID) 40 MG tablet Take 1 tablet (40 mg total) by mouth daily. 30 tablet 0     Assessment: Patient on chronic coumadin for afib. Pharmacy asked to dose coumadin. INR is therapeutic today.  Home coumadin dose 5 mg daily except 2.5 mg on Monday & Friday.    Goal of Therapy:  INR 2-3 Monitor platelets by anticoagulation protocol: Yes   Plan:  Warfarin 5 mg x 1 dose. Monitor daily labs, INR, and s/s of bleeding  Isac Sarna, BS Vena [place], BCPS Clinical Pharmacist Pager [phone] 04/04/2018,9:38 AM

## 2018-04-04 NOTE — Plan of Care (Signed)
Pt discharged. Patient is alert and oriented x 4. His vitals are stable. His iv was removed. Reviewed all discharge paper work with patient including follow-up appointments and new prescriptions as well as changes in medications. Patient verbalizes understanding. Wife at bedside. Patient taken down via wheelchair by nursing staff.

## 2018-04-17 ENCOUNTER — Other Ambulatory Visit: Payer: Self-pay | Admitting: Cardiology

## 2018-04-29 ENCOUNTER — Ambulatory Visit (INDEPENDENT_AMBULATORY_CARE_PROVIDER_SITE_OTHER): Payer: Medicare PPO | Admitting: Pharmacist

## 2018-04-29 DIAGNOSIS — Z5181 Encounter for therapeutic drug level monitoring: Secondary | ICD-10-CM

## 2018-04-29 DIAGNOSIS — I4892 Unspecified atrial flutter: Secondary | ICD-10-CM

## 2018-04-29 LAB — POCT INR: INR: 3 (ref 2.0–3.0)

## 2018-04-29 NOTE — Patient Instructions (Signed)
Description   Continue coumadin 1 tablet daily except 1/2 tablet on Mondays and Fridays.  Recheck in 6 weeks

## 2018-06-10 ENCOUNTER — Ambulatory Visit (INDEPENDENT_AMBULATORY_CARE_PROVIDER_SITE_OTHER): Payer: Medicare PPO | Admitting: *Deleted

## 2018-06-10 DIAGNOSIS — Z5181 Encounter for therapeutic drug level monitoring: Secondary | ICD-10-CM

## 2018-06-10 DIAGNOSIS — I4892 Unspecified atrial flutter: Secondary | ICD-10-CM

## 2018-06-10 LAB — POCT INR: INR: 2 (ref 2.0–3.0)

## 2018-06-10 NOTE — Patient Instructions (Signed)
Continue coumadin 1 tablet daily except 1/2 tablet on Mondays and Fridays Recheck in 6 weeks 

## 2018-06-24 ENCOUNTER — Encounter: Payer: Self-pay | Admitting: *Deleted

## 2018-06-24 ENCOUNTER — Ambulatory Visit (INDEPENDENT_AMBULATORY_CARE_PROVIDER_SITE_OTHER): Payer: Medicare PPO | Admitting: Cardiology

## 2018-06-24 ENCOUNTER — Other Ambulatory Visit: Payer: Self-pay

## 2018-06-24 ENCOUNTER — Encounter: Payer: Self-pay | Admitting: Cardiology

## 2018-06-24 VITALS — BP 117/64 | HR 64 | Ht 72.0 in | Wt 212.6 lb

## 2018-06-24 DIAGNOSIS — I4891 Unspecified atrial fibrillation: Secondary | ICD-10-CM | POA: Diagnosis not present

## 2018-06-24 DIAGNOSIS — I5032 Chronic diastolic (congestive) heart failure: Secondary | ICD-10-CM | POA: Diagnosis not present

## 2018-06-24 DIAGNOSIS — E782 Mixed hyperlipidemia: Secondary | ICD-10-CM

## 2018-06-24 MED ORDER — FUROSEMIDE 80 MG PO TABS: 80.0000 mg | ORAL_TABLET | Freq: Every day | ORAL | 1 refills | Status: DC

## 2018-06-24 NOTE — Progress Notes (Signed)
Clinical Summary Michael Stephens is a 84 y.o.male seen today for follow up of the following medical problems.   1. Atrial arrhythmias - 02/2017 admit noted to have afib, aflutter, and MAT  - admit 9/52/84 with diastolic HF, afib with RVR - oral dilt added to his regimen during that admission.   12/2017 event monitor technically difficult due to poor reception at home, only 12% of data available. Showed afib with normal rates. - no recent palpitations.   - denies any palpitations - no bleeding on coumadin.   2. Chronic diastolic HF - 13/2440 echoLVEF 50-55%, restrictive diastolic dysfunction   - admission 03/2018 with pulmonary edema and diastolic HF - discharged on lasix 80mg  daily.   - Home weights 201 lbs and stable. Occasional swelling.  - since last December breathing has gradually worsened  3. COPD - abnoraml PFTs 12/2017, we referred to Dr Michael Stephens but he never scheduled appt - trying symbicort by pcp, ran out and did not obtain more  - no cough, no wheezing. +SOB  4. Abnormal stress test - nuclear stress with/interolateral infarct with mild to moderate peri-infarct ischemia. - no recent chest pain.   5. Leg pains - pcp ordered ABIs - off statin by pcp Past Medical History:  Diagnosis Date  . Acute diastolic CHF (congestive heart failure) ([place]) 02/11/2017  . Atrial flutter ([place])    a. diagnosed in 02/2017. Rate-control strategy pursued.   Marland Kitchen BPH (benign prostatic hyperplasia)   . Chronic diastolic heart failure ([place])   . Hypertension   . Renal disorder    cyst on kidney     Allergies  Allergen Reactions  . Penicillins     Has patient had a PCN reaction causing immediate rash, facial/tongue/throat swelling, SOB or lightheadedness with hypotension: Yes Has patient had a PCN reaction causing severe rash involving mucus membranes or skin necrosis: No Has patient had a PCN reaction that required hospitalization: No Has patient had a PCN reaction  occurring within the last 10 years: No If all of the above answers are "NO", then may proceed with Cephalosporin use.     Current Outpatient Medications  Medication Sig Dispense Refill  . famotidine (PEPCID) 40 MG tablet Take 1 tablet (40 mg total) by mouth daily. 30 tablet 0  . finasteride (PROSCAR) 5 MG tablet Take 5 mg by mouth daily.    . furosemide (LASIX) 80 MG tablet TAKE ONE TABLET BY MOUTH DAILY 90 tablet 1  . losartan (COZAAR) 25 MG tablet Take 1 tablet (25 mg total) by mouth daily. 30 tablet 0  . metoprolol tartrate (LOPRESSOR) 25 MG tablet Take 1 tablet (25 mg total) by mouth 2 (two) times daily. 60 tablet 1  . potassium chloride (K-DUR,KLOR-CON) 20 MEQ tablet Take 1 tablet (20 mEq total) by mouth daily. 30 tablet 0  . tamsulosin (FLOMAX) 0.4 MG CAPS capsule Take 0.4 mg by mouth daily.     . vitamin B-12 (CYANOCOBALAMIN) 500 MCG tablet Take 1 tablet (500 mcg total) by mouth daily. 30 tablet 1  . warfarin (COUMADIN) 5 MG tablet Take 1 tablet daily except 1/2 tablet on Mondays and Fridays or as directed (Patient taking differently: Take 2.5-5 mg by mouth See admin instructions. Take 1 tablet daily except 1/2 tablet on Mondays and Fridays or as directed) 45 tablet 3   No current facility-administered medications for this visit.      Past Surgical History:  Procedure Laterality Date  . ABCESS DRAINAGE  11/2008  BUTTOCKS  . CATARACT EXTRACTION, BILATERAL    . COLONOSCOPY N/A 04/02/2017   Procedure: COLONOSCOPY;  Surgeon: Michael Houston, MD;  [facility];  Service: Endoscopy;  Laterality: N/A;  10:55  . POLYPECTOMY  04/02/2017   Procedure: POLYPECTOMY;  Surgeon: Michael Houston, MD;  [facility];  Service: Endoscopy;;  asceding colon(hot snare)/ sigmoid colon times two (cold snare)     Allergies  Allergen Reactions  . Penicillins     Has patient had a PCN reaction causing immediate rash, facial/tongue/throat swelling, SOB or lightheadedness with  hypotension: Yes Has patient had a PCN reaction causing severe rash involving mucus membranes or skin necrosis: No Has patient had a PCN reaction that required hospitalization: No Has patient had a PCN reaction occurring within the last 10 years: No If all of the above answers are "NO", then may proceed with Cephalosporin use.      Family History  Problem Relation Age of Onset  . CVA Father   . CAD Father   . Diabetes Sister   . Colon cancer Neg Hx      Social History Michael Stephens reports that he quit smoking about 14 years ago. His smoking use included cigarettes. He has never used smokeless tobacco. Michael Stephens reports no history of alcohol use.   Review of Systems CONSTITUTIONAL: No weight loss, fever, chills, weakness or fatigue.  HEENT: Eyes: No visual loss, blurred vision, double vision or yellow sclerae.No hearing loss, sneezing, congestion, runny nose or sore throat.  SKIN: No rash or itching.  CARDIOVASCULAR: per hpi RESPIRATORY: per hpi GASTROINTESTINAL: No anorexia, nausea, vomiting or diarrhea. No abdominal pain or blood.  GENITOURINARY: No burning on urination, no polyuria NEUROLOGICAL: No headache, dizziness, syncope, paralysis, ataxia, numbness or tingling in the extremities. No change in bowel or bladder control.  MUSCULOSKELETAL: +leg pains LYMPHATICS: No enlarged nodes. No history of splenectomy.  PSYCHIATRIC: No history of depression or anxiety.  ENDOCRINOLOGIC: No reports of sweating, cold or heat intolerance. No polyuria or polydipsia.  Marland Kitchen   Physical Examination Today's Vitals   06/24/18 1138  BP: 117/64  Pulse: 64  SpO2: 97%  Weight: 212 lb 9.6 oz (96.4 kg)  Height: 6' (1.829 m)   Body mass index is 28.83 kg/m.  Gen: resting comfortably, no acute distress HEENT: no scleral icterus, pupils equal round and reactive, no palptable cervical adenopathy,  CV: RRR, no m/r/g, no jvd Resp: Clear to auscultation bilaterally GI: abdomen is soft,  non-tender, non-distended, normal bowel sounds, no hepatosplenomegaly MSK: extremities are warm, trace bilateral edema Skin: warm, no rash Neuro:  no focal deficits Psych: appropriate affect   Diagnostic Studies 02/2017 nuclear stress  There was no ST segment deviation noted during stress.  Findings consistent with prior inferior/inferolateral myocardial infarction with mild to moderate peri-infarct ischemia.  This is a high risk study. High risk based on decreased LVEF. Based on prior infarct and current level of ischemia alone this would sugest low to intermediate risk . Recommend correlate findings with echo  The left ventricular ejection fraction is moderately decreased (30-44%).  01/2017 echo Study Conclusions  - Left ventricle: The cavity size was normal. Wall thickness was increased in a pattern of mild LVH. Systolic function was normal. The estimated ejection fraction was in the range of 50% to 55%. Doppler parameters are consistent with restrictive physiology, indicative of decreased left ventricular diastolic compliance and/or increased left atrial pressure. Doppler parameters are consistent with high ventricular filling pressure. -  Aortic valve: Mildly calcified annulus. Trileaflet; mildly thickened leaflets. There was very mild stenosis. Valve area (VTI): 1.59 cm^2. Valve area (Vmax): 1.73 cm^2. - Left atrium: The atrium was severely dilated. - Right atrium: The atrium was mildly dilated. - Atrial septum: No defect or patent foramen ovale was identified. - Inferior vena cava: The vessel was dilated. The respirophasic diameter changes were blunted (<50%), consistent with elevated central venous pressure. - Technically difficult study.   12/2017 PFTs: +COPD   \12/2017 event monitor  7 day event monitor. Data available only for 12% of planned time  Min HR 67, Max HR 120, Avg HR 87  No symptoms reported  Available telemetry tracings  show afib with normal rates.    Assessment and Plan  1. Afib - no symptoms, continue current meds   2. Chronic diastolic HF - mild LE edema, weight stable. Rest of exam failry benign. Do not think CHF is main reason for his SOB, likely his COPD that is not taking his inhalers anymore for - increase lasix to 80mg  in AM and 40mg  in PM x 3 days, then resume 80mg  daily.    3. COPD - asked to contact his pcp about restarting treatment.     4. Hyperlipidemia - muscle aches on pravastatin, remains off statin at this time  5. Leg pains - pcp ordered ABIs at [facility], we will request results       Arnoldo Lenis, M.D.

## 2018-06-24 NOTE — Patient Instructions (Signed)
Your physician recommends that you schedule a follow-up appointment in: [place] has recommended you make the following change in your medication:   INCREASE LASIX 80 MG IN THE MORNING AND 40 MG IN THE EVENING THEN RESUME NORMAL DOSE OF 80 MG DAILY   Thank you for choosing [facility]!!

## 2018-07-08 ENCOUNTER — Inpatient Hospital Stay (HOSPITAL_COMMUNITY)
Admission: EM | Admit: 2018-07-08 | Discharge: 2018-07-14 | DRG: 280 | Disposition: A | Discharge status: Home / Self Care | Payer: Medicare PPO | Attending: Internal Medicine | Admitting: Internal Medicine

## 2018-07-08 ENCOUNTER — Emergency Department (HOSPITAL_COMMUNITY): Payer: Medicare PPO

## 2018-07-08 ENCOUNTER — Other Ambulatory Visit: Payer: Self-pay

## 2018-07-08 ENCOUNTER — Encounter (HOSPITAL_COMMUNITY): Payer: Self-pay | Admitting: *Deleted

## 2018-07-08 DIAGNOSIS — K644 Residual hemorrhoidal skin tags: Secondary | ICD-10-CM | POA: Diagnosis present

## 2018-07-08 DIAGNOSIS — D1779 Benign lipomatous neoplasm of other sites: Secondary | ICD-10-CM | POA: Diagnosis present

## 2018-07-08 DIAGNOSIS — Z87891 Personal history of nicotine dependence: Secondary | ICD-10-CM

## 2018-07-08 DIAGNOSIS — I10 Essential (primary) hypertension: Secondary | ICD-10-CM | POA: Diagnosis not present

## 2018-07-08 DIAGNOSIS — I110 Hypertensive heart disease with heart failure: Secondary | ICD-10-CM | POA: Diagnosis not present

## 2018-07-08 DIAGNOSIS — Z7951 Long term (current) use of inhaled steroids: Secondary | ICD-10-CM

## 2018-07-08 DIAGNOSIS — K5731 Diverticulosis of large intestine without perforation or abscess with bleeding: Secondary | ICD-10-CM | POA: Diagnosis present

## 2018-07-08 DIAGNOSIS — K2901 Acute gastritis with bleeding: Secondary | ICD-10-CM | POA: Diagnosis present

## 2018-07-08 DIAGNOSIS — D689 Coagulation defect, unspecified: Secondary | ICD-10-CM | POA: Diagnosis not present

## 2018-07-08 DIAGNOSIS — R0789 Other chest pain: Secondary | ICD-10-CM

## 2018-07-08 DIAGNOSIS — Z880 Allergy status to penicillin: Secondary | ICD-10-CM

## 2018-07-08 DIAGNOSIS — Z7901 Long term (current) use of anticoagulants: Secondary | ICD-10-CM

## 2018-07-08 DIAGNOSIS — I472 Ventricular tachycardia: Secondary | ICD-10-CM | POA: Diagnosis present

## 2018-07-08 DIAGNOSIS — K5730 Diverticulosis of large intestine without perforation or abscess without bleeding: Secondary | ICD-10-CM | POA: Diagnosis present

## 2018-07-08 DIAGNOSIS — Z9842 Cataract extraction status, left eye: Secondary | ICD-10-CM | POA: Diagnosis not present

## 2018-07-08 DIAGNOSIS — N400 Enlarged prostate without lower urinary tract symptoms: Secondary | ICD-10-CM | POA: Diagnosis not present

## 2018-07-08 DIAGNOSIS — I214 Non-ST elevation (NSTEMI) myocardial infarction: Secondary | ICD-10-CM | POA: Diagnosis not present

## 2018-07-08 DIAGNOSIS — I5041 Acute combined systolic (congestive) and diastolic (congestive) heart failure: Secondary | ICD-10-CM | POA: Diagnosis not present

## 2018-07-08 DIAGNOSIS — I480 Paroxysmal atrial fibrillation: Secondary | ICD-10-CM | POA: Diagnosis not present

## 2018-07-08 DIAGNOSIS — R195 Other fecal abnormalities: Secondary | ICD-10-CM | POA: Insufficient documentation

## 2018-07-08 DIAGNOSIS — J449 Chronic obstructive pulmonary disease, unspecified: Secondary | ICD-10-CM | POA: Diagnosis not present

## 2018-07-08 DIAGNOSIS — K2981 Duodenitis with bleeding: Secondary | ICD-10-CM | POA: Diagnosis present

## 2018-07-08 DIAGNOSIS — E785 Hyperlipidemia, unspecified: Secondary | ICD-10-CM | POA: Diagnosis not present

## 2018-07-08 DIAGNOSIS — D649 Anemia, unspecified: Secondary | ICD-10-CM | POA: Diagnosis not present

## 2018-07-08 DIAGNOSIS — R7989 Other specified abnormal findings of blood chemistry: Secondary | ICD-10-CM | POA: Diagnosis not present

## 2018-07-08 DIAGNOSIS — I5032 Chronic diastolic (congestive) heart failure: Secondary | ICD-10-CM | POA: Diagnosis present

## 2018-07-08 DIAGNOSIS — R9431 Abnormal electrocardiogram [ECG] [EKG]: Secondary | ICD-10-CM | POA: Diagnosis not present

## 2018-07-08 DIAGNOSIS — Z8249 Family history of ischemic heart disease and other diseases of the circulatory system: Secondary | ICD-10-CM

## 2018-07-08 DIAGNOSIS — I2510 Atherosclerotic heart disease of native coronary artery without angina pectoris: Secondary | ICD-10-CM | POA: Diagnosis present

## 2018-07-08 DIAGNOSIS — I4892 Unspecified atrial flutter: Secondary | ICD-10-CM | POA: Diagnosis present

## 2018-07-08 DIAGNOSIS — I519 Heart disease, unspecified: Secondary | ICD-10-CM | POA: Diagnosis not present

## 2018-07-08 DIAGNOSIS — K210 Gastro-esophageal reflux disease with esophagitis: Secondary | ICD-10-CM | POA: Diagnosis not present

## 2018-07-08 DIAGNOSIS — K922 Gastrointestinal hemorrhage, unspecified: Secondary | ICD-10-CM | POA: Diagnosis not present

## 2018-07-08 DIAGNOSIS — I248 Other forms of acute ischemic heart disease: Secondary | ICD-10-CM

## 2018-07-08 DIAGNOSIS — R931 Abnormal findings on diagnostic imaging of heart and coronary circulation: Secondary | ICD-10-CM | POA: Diagnosis not present

## 2018-07-08 DIAGNOSIS — Z9841 Cataract extraction status, right eye: Secondary | ICD-10-CM

## 2018-07-08 DIAGNOSIS — Z8701 Personal history of pneumonia (recurrent): Secondary | ICD-10-CM

## 2018-07-08 DIAGNOSIS — I4891 Unspecified atrial fibrillation: Secondary | ICD-10-CM | POA: Diagnosis not present

## 2018-07-08 DIAGNOSIS — D62 Acute posthemorrhagic anemia: Secondary | ICD-10-CM | POA: Diagnosis present

## 2018-07-08 DIAGNOSIS — Z79899 Other long term (current) drug therapy: Secondary | ICD-10-CM

## 2018-07-08 DIAGNOSIS — K648 Other hemorrhoids: Secondary | ICD-10-CM | POA: Diagnosis present

## 2018-07-08 DIAGNOSIS — D500 Iron deficiency anemia secondary to blood loss (chronic): Secondary | ICD-10-CM | POA: Diagnosis not present

## 2018-07-08 LAB — COMPREHENSIVE METABOLIC PANEL
ALT: 18 U/L (ref 0–44)
AST: 23 U/L (ref 15–41)
Albumin: 3.6 g/dL (ref 3.5–5.0)
Alkaline Phosphatase: 55 U/L (ref 38–126)
Anion gap: 11 (ref 5–15)
BUN: 30 mg/dL — ABNORMAL HIGH (ref 8–23)
CO2: 25 mmol/L (ref 22–32)
Calcium: 9.2 mg/dL (ref 8.9–10.3)
Chloride: 99 mmol/L (ref 98–111)
Creatinine, Ser: 0.83 mg/dL (ref 0.61–1.24)
GFR calc Af Amer: >60 mL/min (ref >60)
GFR calc non Af Amer: >60 mL/min (ref >60)
Glucose, Bld: 183 mg/dL — ABNORMAL HIGH (ref 70–99)
Potassium: 3.5 mmol/L (ref 3.5–5.1)
SODIUM: 135 mmol/L (ref 135–145)
Total Bilirubin: 0.2 mg/dL — ABNORMAL LOW (ref 0.3–1.2)
Total Protein: 6.1 g/dL — ABNORMAL LOW (ref 6.5–8.1)

## 2018-07-08 LAB — PROTIME-INR
INR: 2.4 — ABNORMAL HIGH (ref 0.8–1.2)
Prothrombin Time: 25.6 seconds — ABNORMAL HIGH (ref 11.4–15.2)

## 2018-07-08 LAB — CBC
HCT: 19.2 % — ABNORMAL LOW (ref 39.0–52.0)
Hemoglobin: 5.5 g/dL — CL (ref 13.0–17.0)
MCH: 26.4 pg (ref 26.0–34.0)
MCHC: 28.6 g/dL — AB (ref 30.0–36.0)
MCV: 92.3 fL (ref 80.0–100.0)
Platelets: 270 10*3/uL (ref 150–400)
RBC: 2.08 MIL/uL — ABNORMAL LOW (ref 4.22–5.81)
RDW: 16.8 % — ABNORMAL HIGH (ref 11.5–15.5)
WBC: 7.1 10*3/uL (ref 4.0–10.5)
nRBC: 0.4 % — ABNORMAL HIGH (ref 0.0–0.2)

## 2018-07-08 LAB — I-STAT TROPONIN, ED: Troponin i, poc: 0.15 ng/mL (ref 0.00–0.08)

## 2018-07-08 LAB — TROPONIN I: Troponin I: 0.2 ng/mL (ref <0.03)

## 2018-07-08 LAB — LIPASE, BLOOD: Lipase: 26 U/L (ref 11–51)

## 2018-07-08 LAB — PREPARE RBC (CROSSMATCH)

## 2018-07-08 LAB — ABO/RH: ABO/RH(D): O NEG

## 2018-07-08 LAB — POC OCCULT BLOOD, ED: Fecal Occult Bld: POSITIVE — AB

## 2018-07-08 MED ORDER — ONDANSETRON HCL 4 MG/2ML IJ SOLN: 4.0000 mg | Freq: Four times a day (QID) | INTRAMUSCULAR | Status: DC | PRN

## 2018-07-08 MED ORDER — ACETAMINOPHEN 325 MG PO TABS: 650.0000 mg | ORAL_TABLET | Freq: Four times a day (QID) | ORAL | Status: DC | PRN

## 2018-07-08 MED ORDER — SODIUM CHLORIDE 0.9 % IV SOLN: 250.0000 mL | INTRAVENOUS | Status: DC | PRN

## 2018-07-08 MED ORDER — NITROGLYCERIN 0.4 MG SL SUBL
0.4000 mg | SUBLINGUAL_TABLET | SUBLINGUAL | Status: DC | PRN
  Filled 2018-07-08: qty 1

## 2018-07-08 MED ORDER — SODIUM CHLORIDE 0.9% FLUSH: 3.0000 mL | INTRAVENOUS | Status: DC | PRN

## 2018-07-08 MED ORDER — TAMSULOSIN HCL 0.4 MG PO CAPS
0.4000 mg | ORAL_CAPSULE | Freq: Every day | ORAL | Status: DC
  Administered 2018-07-08 – 2018-07-14 (×7): 0.4 mg via ORAL
  Filled 2018-07-08 (×8): qty 1

## 2018-07-08 MED ORDER — PANTOPRAZOLE SODIUM 40 MG IV SOLR
40.0000 mg | Freq: Two times a day (BID) | INTRAVENOUS | Status: DC
  Administered 2018-07-08 – 2018-07-09 (×3): 40 mg via INTRAVENOUS
  Filled 2018-07-08 (×3): qty 40

## 2018-07-08 MED ORDER — ASPIRIN 81 MG PO CHEW
324.0000 mg | CHEWABLE_TABLET | Freq: Once | ORAL | Status: AC
  Administered 2018-07-08: 324 mg via ORAL
  Filled 2018-07-08: qty 4

## 2018-07-08 MED ORDER — PANTOPRAZOLE SODIUM 40 MG IV SOLR
40.0000 mg | Freq: Once | INTRAVENOUS | Status: AC
  Administered 2018-07-08: 40 mg via INTRAVENOUS
  Filled 2018-07-08: qty 40

## 2018-07-08 MED ORDER — SODIUM CHLORIDE 0.9 % IV SOLN
10.0000 mL/h | Freq: Once | INTRAVENOUS | Status: AC
  Administered 2018-07-08: 10 mL/h via INTRAVENOUS

## 2018-07-08 MED ORDER — MOMETASONE FURO-FORMOTEROL FUM 200-5 MCG/ACT IN AERO
2.0000 | INHALATION_SPRAY | Freq: Two times a day (BID) | RESPIRATORY_TRACT | Status: DC
  Administered 2018-07-09 – 2018-07-14 (×10): 2 via RESPIRATORY_TRACT
  Filled 2018-07-08: qty 8.8

## 2018-07-08 MED ORDER — FAMOTIDINE 20 MG PO TABS: 40.0000 mg | ORAL_TABLET | Freq: Every day | ORAL | Status: DC

## 2018-07-08 MED ORDER — ONDANSETRON HCL 4 MG PO TABS: 4.0000 mg | ORAL_TABLET | Freq: Four times a day (QID) | ORAL | Status: DC | PRN

## 2018-07-08 MED ORDER — FINASTERIDE 5 MG PO TABS
5.0000 mg | ORAL_TABLET | Freq: Every day | ORAL | Status: DC
  Administered 2018-07-08 – 2018-07-14 (×7): 5 mg via ORAL
  Filled 2018-07-08 (×7): qty 1

## 2018-07-08 MED ORDER — SODIUM CHLORIDE 0.9% FLUSH
3.0000 mL | Freq: Once | INTRAVENOUS | Status: AC
  Administered 2018-07-08: 3 mL via INTRAVENOUS

## 2018-07-08 MED ORDER — SODIUM CHLORIDE 0.9% FLUSH
3.0000 mL | Freq: Two times a day (BID) | INTRAVENOUS | Status: DC
  Administered 2018-07-08 – 2018-07-12 (×8): 3 mL via INTRAVENOUS

## 2018-07-08 MED ORDER — ACETAMINOPHEN 650 MG RE SUPP: 650.0000 mg | Freq: Four times a day (QID) | RECTAL | Status: DC | PRN

## 2018-07-08 NOTE — ED Triage Notes (Signed)
Pt states he woke up epigastric pain and sob for past few days.  Pt with hx COPD.  Pt denies any travel and denies contacts that have traveled.

## 2018-07-08 NOTE — Consult Note (Addendum)
Changed from H&P to consult note, we had been contacted on presentation due to EKG changes prior to diagnosis of severe anemia. Cardiology will be following as consult    Cardiology Admission History and Physical:   Patient ID: Michael Stephens MRN: [id number]; DOB: 1934/12/05   Admission date: 07/08/2018  Primary Care Provider: [facility], [facility] Primary Cardiologist: Carlyle Dolly, MD  Primary Electrophysiologist:  None   Chief Complaint:  Chest pain  Patient Profile:   Michael Stephens is a 84 y.o. male with history of atrial arrhythmias including afib/aflutter/MAT, chronic diastolic HF, COPD presents with chest pain.   History of Present Illness:   Michael Stephens is 84 yo male history of atrial arrythmias including afib/aflutter/MAT, chronic diastolic HF, COPD, abnormal stress test 2018 intermediate risk managed medcally presents with chest pain. We are consulted by Dr Julianne Rice.   Prior intermediate risk nuclear stress in 2018. Symptoms resolved with medical therapy alone, did not pursue cath.   Presents with primarily epigastric and upper abominal pain. Reports symptoms on and off for some time, progressing. Worst after eating. Severe episode this AM after eating cookies. 9/10 pressure epigastric into upper abdomen, constant since 830AM (now at 6 hours), worst with position. Increased belching. Significant SOB associated, also with DOE with activities recently.    WBC 7.1 Hgb 5.5 Plt 270 K 3.5 Cr 0.83 INR 2.4  EKG: SR, ST elevation aVR, diffuse ST depression CXR pulm congestion Trop 0.20-->  03/2018 echo LVEF 55-60%, no WMAs, restrictive diastolic function, mild AS 02/2017 nuclear stress: inferior/inferolateral infarct with mild to moderate peri-infarct ischemia     Past Medical History:  Diagnosis Date  . Acute diastolic CHF (congestive heart failure) ([place]) 02/11/2017  . Atrial flutter ([place])    a. diagnosed in 02/2017. Rate-control strategy pursued.   Marland Kitchen BPH  (benign prostatic hyperplasia)   . Chronic diastolic heart failure ([place])   . Hypertension   . Renal disorder    cyst on kidney    Past Surgical History:  Procedure Laterality Date  . ABCESS DRAINAGE  11/2008   BUTTOCKS  . CATARACT EXTRACTION, BILATERAL    . COLONOSCOPY N/A 04/02/2017   Procedure: COLONOSCOPY;  Surgeon: Rogene Houston, MD;  [facility];  Service: Endoscopy;  Laterality: N/A;  10:55  . POLYPECTOMY  04/02/2017   Procedure: POLYPECTOMY;  Surgeon: Rogene Houston, MD;  [facility];  Service: Endoscopy;;  asceding colon(hot snare)/ sigmoid colon times two (cold snare)     Medications Prior to Admission: Prior to Admission medications   Medication Sig Start Date End Date Taking? Authorizing Provider  famotidine (PEPCID) 40 MG tablet Take 1 tablet (40 mg total) by mouth daily. 03/29/18 06/24/19  Long, Wonda Olds, MD  finasteride (PROSCAR) 5 MG tablet Take 5 mg by mouth daily.    [provider]  furosemide (LASIX) 80 MG tablet Take 1 tablet (80 mg total) by mouth daily. 06/24/18   Arnoldo Lenis, MD  losartan (COZAAR) 25 MG tablet Take 1 tablet (25 mg total) by mouth daily. 09/09/17 06/24/19  Johnson, Clanford L, MD  metoprolol tartrate (LOPRESSOR) 25 MG tablet Take 1 tablet (25 mg total) by mouth 2 (two) times daily. 04/04/18   Orson Eva, MD  potassium chloride (K-DUR,KLOR-CON) 20 MEQ tablet Take 1 tablet (20 mEq total) by mouth daily. 09/09/17 06/24/19  Johnson, Clanford L, MD  tamsulosin (FLOMAX) 0.4 MG CAPS capsule Take 0.4 mg by mouth daily.     [provider]  vitamin B-12 (CYANOCOBALAMIN) 500 MCG tablet Take 1 tablet (500 mcg total) by mouth daily. 04/05/18   Orson Eva, MD  warfarin (COUMADIN) 5 MG tablet Take 1 tablet daily except 1/2 tablet on Mondays and Fridays or as directed Patient taking differently: Take 2.5-5 mg by mouth See admin instructions. Take 1 tablet daily except 1/2 tablet on Mondays and Fridays or as directed  03/08/18   Arnoldo Lenis, MD     Allergies:    Allergies  Allergen Reactions  . Penicillins     Has patient had a PCN reaction causing immediate rash, facial/tongue/throat swelling, SOB or lightheadedness with hypotension: Yes Has patient had a PCN reaction causing severe rash involving mucus membranes or skin necrosis: No Has patient had a PCN reaction that required hospitalization: No Has patient had a PCN reaction occurring within the last 10 years: No If all of the above answers are "NO", then may proceed with Cephalosporin use.    Social History:   Social History   Socioeconomic History  . Marital status: Married    Spouse name: Not on file  . Number of children: Not on file  . Years of education: Not on file  . Highest education level: Not on file  Occupational History  . Not on file  Social Needs  . Financial resource strain: Not on file  . Food insecurity:    Worry: Not on file    Inability: Not on file  . Transportation needs:    Medical: Not on file    Non-medical: Not on file  Tobacco Use  . Smoking status: Former Smoker    Types: Cigarettes    Last attempt to quit: 2006    Years since quitting: 14.2  . Smokeless tobacco: Never Used  Substance and Sexual Activity  . Alcohol use: No  . Drug use: No  . Sexual activity: Never  Lifestyle  . Physical activity:    Days per week: Not on file    Minutes per session: Not on file  . Stress: Not on file  Relationships  . Social connections:    Talks on phone: Not on file    Gets together: Not on file    Attends religious service: Not on file    Active member of club or organization: Not on file    Attends meetings of clubs or organizations: Not on file    Relationship status: Not on file  . Intimate partner violence:    Fear of current or ex partner: Not on file    Emotionally abused: Not on file    Physically abused: Not on file    Forced sexual activity: Not on file  Other Topics Concern  . Not on  file  Social History Narrative  . Not on file    Family History:  The patient's family history includes CAD in his father; CVA in his father; Diabetes in his sister. There is no history of Colon cancer.    ROS:  Please see the history of present illness.  All other ROS reviewed and negative.     Physical Exam/Data:   Vitals:   07/08/18 1218 07/08/18 1221  BP:  126/67  Pulse:  95  Resp:  (!) 26  Temp:  97.6 F (36.4 C)  TempSrc:  Oral  SpO2:  98%  Weight: 91.6 kg   Height: 6' (1.829 m)     Intake/Output Summary (Last 24 hours) at 07/08/2018 1259 Last data filed at 07/08/2018 1248 Gross per  24 hour  Intake 3 ml  Output -  Net 3 ml   Last 3 Weights 07/08/2018 06/24/2018 04/04/2018  Weight (lbs) 202 lb 212 lb 9.6 oz 201 lb 8 oz  Weight (kg) 91.627 kg 96.435 kg 91.4 kg     Body mass index is 27.4 kg/m.  General:  Well nourished, well developed, in no acute distress HEENT: normal Lymph: no adenopathy Neck: no JVD Endocrine:  No thryomegaly Cardiac:  normal S1, S2; RRR; no murmur Lungs:  clear to auscultation bilaterally, no wheezing, rhonchi or rales  Abd: soft, nontender, no hepatomegaly  Ext: trace bilateral edema Musculoskeletal:  No deformities, BUE and BLE strength normal and equal Skin: warm and dry  Neuro:  CNs 2-12 intact, no focal abnormalities noted Psych:  Normal affect      Laboratory Data:  ChemistryNo results for input(s): NA, K, CL, CO2, GLUCOSE, BUN, CREATININE, CALCIUM, GFRNONAA, GFRAA, ANIONGAP in the last 168 hours.  No results for input(s): PROT, ALBUMIN, AST, ALT, ALKPHOS, BILITOT in the last 168 hours. HematologyNo results for input(s): WBC, RBC, HGB, HCT, MCV, MCH, MCHC, RDW, PLT in the last 168 hours. Cardiac EnzymesNo results for input(s): TROPONINI in the last 168 hours. No results for input(s): TROPIPOC in the last 168 hours.  BNPNo results for input(s): BNP, PROBNP in the last 168 hours.  DDimer No results for input(s): DDIMER in the  last 168 hours.  Radiology/Studies:  No results found.  Assessment and Plan:   1. Epigastric pain/Abdominal pain/Elevated troponin - symptoms most suggestive of GI pathology. Epigastric/upper abdominal pain brought on with eating that lasts for hours. Elevated troponin and EKG changes in setting of severe anemia Hgb 5.5  - prior intermediate risk stress test 2018, symptoms resolved with medical therapy and did not pursue cath - presents with significant EKG changes, aVR ST elevation and diffuse ST depression suggesting LM or multivessel disease. Mild troponin elevation thus far  - I suspect presentation is likely due to chronic obstructive CAD as referenced by prior stress testing, combined with decreased supply related to severe anemia. I think acute obstructive CAD is less likely though his EKG is quite abnormal. Currently pain is improving.  - continue medical therapy with lopressor, ARB. No statin given prior side effects. INR is therapeutic, with anemia would avoid heparin at this time. Follow trops and EKGs as he is transfused and anemia is managed. At this time would not consider cath based on current information/status, work to optimize oxygen supply with transfusion and follow status  2. Severe anemia - recent dark stools he attributes to pepto bismol - chronic anemia that has been trending down, he denies signs of active acute bleeding.  - transfusion and management per ER/hospitalist teams. His epigastric symptoms would support possible upper GI bleed, consider GI evaluation.  -  if reversal is coumadin is felt neccesary by primary team ok from cardiac standpoint. May consider transfusin and follow trends prior to reversal, his presentation seems to be subacute, certaintly if signs of active bleeding would reverse with vitamin K.    3. Atrial arrhythmias - history of afib/aflutter/MAT - Sinus rhythm currently, has been on coumadin at home.  - hold coumadin in setting of severe  anemia.   Given multiple acute medical conditions with some high risk findings that could decompensate and likely multiple subspecialites needing to be involved, would consider transfer to [facility] to step down.    Severity of Illness:  For questions or updates, please contact [facility] Please consult [email or web address] for contact info under        Signed, Carlyle Dolly, MD  07/08/2018 12:59 PM

## 2018-07-08 NOTE — ED Notes (Signed)
Date and time results received: 07/08/18 1333 (use smartphrase ".now" to insert current time)  Test: hemoglobin  Critical Value: 5.5  Name of Provider Notified: yelverton  Orders Received? Or Actions Taken?: see chart

## 2018-07-08 NOTE — ED Provider Notes (Signed)
[facility] EMERGENCY DEPARTMENT Provider Note   CSN: [id number] Arrival date & time: 07/08/18  1207    History   Chief Complaint Chief Complaint  Patient presents with  . Chest Pain    HPI Michael Stephens is a 84 y.o. male.     HPI Patient presents with central central chest tightness for the last few days.  States this worsened this morning around 7 AM and developed radiation down his left arm.  Associated with shortness of breath.  Denies cough, fever or chills.  No new lower extremity swelling or pain. Past Medical History:  Diagnosis Date  . Acute diastolic CHF (congestive heart failure) ([place]) 02/11/2017  . Atrial flutter ([place])    a. diagnosed in 02/2017. Rate-control strategy pursued.   Marland Kitchen BPH (benign prostatic hyperplasia)   . Chronic diastolic heart failure ([place])   . Hypertension   . Renal disorder    cyst on kidney    Patient Active Problem List   Diagnosis Date Noted  . Shortness of breath 04/01/2018  . BPH (benign prostatic hyperplasia) 04/01/2018  . AF (paroxysmal atrial fibrillation) ([place]) 04/01/2018  . Acute on chronic systolic heart failure ([place]) 03/31/2018  . Urinary retention 09/07/2017  . Constipation 09/07/2017  . CHF exacerbation ([place]) 09/04/2017  . Acute on chronic diastolic CHF (congestive heart failure) ([place]) 09/04/2017  . Encounter for therapeutic drug monitoring 08/27/2017  . Change in stool 03/18/2017  . Diarrhea 03/02/2017  . Aortic stenosis 03/02/2017  . CAP (community acquired pneumonia) 02/16/2017  . Essential hypertension 02/11/2017  . Demand ischemia ([place]) 02/11/2017  . Acute diastolic CHF (congestive heart failure) ([place]) 02/11/2017  . Acute respiratory failure with hypoxia ([place]) 02/11/2017  . Paroxysmal atrial flutter ([place]) 02/09/2017    Past Surgical History:  Procedure Laterality Date  . ABCESS DRAINAGE  11/2008   BUTTOCKS  . CATARACT EXTRACTION, BILATERAL    . COLONOSCOPY N/A 04/02/2017   Procedure: COLONOSCOPY;  Surgeon:  Rogene Houston, MD;  Location: [facility] ENDO SUITE;  Service: Endoscopy;  Laterality: N/A;  10:55  . POLYPECTOMY  04/02/2017   Procedure: POLYPECTOMY;  Surgeon: Rogene Houston, MD;  Location: [facility] ENDO SUITE;  Service: Endoscopy;;  asceding colon(hot snare)/ sigmoid colon times two (cold snare)        Home Medications    Prior to Admission medications   Medication Sig Start Date End Date Taking? Authorizing Provider  budesonide-formoterol (SYMBICORT) 160-4.5 MCG/ACT inhaler Inhale 2 puffs into the lungs 2 (two) times daily.   Yes [provider]  famotidine (PEPCID) 40 MG tablet Take 1 tablet (40 mg total) by mouth daily. 03/29/18 06/24/19 Yes Long, Wonda Olds, MD  finasteride (PROSCAR) 5 MG tablet Take 5 mg by mouth daily.   Yes [provider]  furosemide (LASIX) 80 MG tablet Take 1 tablet (80 mg total) by mouth daily. 06/24/18  Yes Branch, Alphonse Guild, MD  losartan (COZAAR) 25 MG tablet Take 1 tablet (25 mg total) by mouth daily. 09/09/17 06/24/19 Yes Johnson, Clanford L, MD  metoprolol tartrate (LOPRESSOR) 25 MG tablet Take 1 tablet (25 mg total) by mouth 2 (two) times daily. 04/04/18  Yes Tat, Shanon Brow, MD  potassium chloride (K-DUR,KLOR-CON) 20 MEQ tablet Take 1 tablet (20 mEq total) by mouth daily. 09/09/17 06/24/19 Yes Johnson, Clanford L, MD  tamsulosin (FLOMAX) 0.4 MG CAPS capsule Take 0.4 mg by mouth daily.    Yes [provider]  warfarin (COUMADIN) 5 MG tablet Take 1 tablet daily except 1/2 tablet on  Mondays and Fridays or as directed Patient taking differently: Take 2.5-5 mg by mouth See admin instructions. Take 5 mg daily except for 2.5 mg  on Mondays and Fridays 03/08/18  Yes Branch, Alphonse Guild, MD  vitamin B-12 (CYANOCOBALAMIN) 500 MCG tablet Take 1 tablet (500 mcg total) by mouth daily. Patient not taking: Reported on 07/08/2018 04/05/18   Orson Eva, MD    Family History Family History  Problem Relation Age of Onset  . CVA Father   . CAD Father   . Diabetes  Sister   . Colon cancer Neg Hx     Social History Social History   Tobacco Use  . Smoking status: Former Smoker    Types: Cigarettes    Last attempt to quit: 2006    Years since quitting: 14.2  . Smokeless tobacco: Never Used  Substance Use Topics  . Alcohol use: No  . Drug use: No     Allergies   Penicillins   Review of Systems Review of Systems  Constitutional: Negative for chills and fever.  HENT: Negative for trouble swallowing.   Eyes: Negative for visual disturbance.  Respiratory: Positive for chest tightness and shortness of breath. Negative for cough.   Cardiovascular: Negative for palpitations and leg swelling.  Gastrointestinal: Negative for constipation, diarrhea, nausea and vomiting.  Musculoskeletal: Negative for back pain, myalgias and neck pain.  Skin: Negative for rash and wound.  Neurological: Negative for dizziness, weakness, light-headedness and headaches.  All other systems reviewed and are negative.    Physical Exam Updated Vital Signs BP 101/81   Pulse 94   Temp 97.6 F (36.4 C) (Oral)   Resp 19   Ht 6' (1.829 m)   Wt 91.6 kg   SpO2 100%   BMI 27.40 kg/m   Physical Exam Vitals signs and nursing note reviewed.  Constitutional:      General: He is not in acute distress.    Appearance: Normal appearance. He is well-developed. He is not ill-appearing.  HENT:     Head: Normocephalic and atraumatic.     Nose: Nose normal.     Mouth/Throat:     Mouth: Mucous membranes are moist.  Eyes:     Pupils: Pupils are equal, round, and reactive to light.  Neck:     Musculoskeletal: Normal range of motion and neck supple. No neck rigidity or muscular tenderness.     Vascular: No carotid bruit.  Cardiovascular:     Rate and Rhythm: Normal rate and regular rhythm.     Heart sounds: No murmur. No friction rub. No gallop.   Pulmonary:     Effort: Pulmonary effort is normal.     Breath sounds: Wheezing present.     Comments: Few scattered  wheezes Abdominal:     General: Bowel sounds are normal. There is no distension.     Palpations: Abdomen is soft. There is no mass.     Tenderness: There is no abdominal tenderness. There is no right CVA tenderness, left CVA tenderness, guarding or rebound.     Hernia: No hernia is present.  Musculoskeletal: Normal range of motion.        General: No swelling, tenderness, deformity or signs of injury.     Right lower leg: No edema.     Left lower leg: No edema.  Lymphadenopathy:     Cervical: No cervical adenopathy.  Skin:    General: Skin is warm and dry.     Capillary Refill: Capillary refill takes less  than 2 seconds.     Findings: No erythema or rash.  Neurological:     General: No focal deficit present.     Mental Status: He is alert and oriented to person, place, and time.     Comments: Moving all extremities without focal deficit.  Sensation intact.  Psychiatric:        Mood and Affect: Mood normal.        Behavior: Behavior normal.      ED Treatments / Results  Labs (all labs ordered are listed, but only abnormal results are displayed) Labs Reviewed  CBC - Abnormal; Notable for the following components:      Result Value   RBC 2.08 (*)    Hemoglobin 5.5 (*)    HCT 19.2 (*)    MCHC 28.6 (*)    RDW 16.8 (*)    nRBC 0.4 (*)    All other components within normal limits  TROPONIN I - Abnormal; Notable for the following components:   Troponin I 0.20 (*)    All other components within normal limits  COMPREHENSIVE METABOLIC PANEL - Abnormal; Notable for the following components:   Glucose, Bld 183 (*)    BUN 30 (*)    Total Protein 6.1 (*)    Total Bilirubin 0.2 (*)    All other components within normal limits  PROTIME-INR - Abnormal; Notable for the following components:   Prothrombin Time 25.6 (*)    INR 2.4 (*)    All other components within normal limits  I-STAT TROPONIN, ED - Abnormal; Notable for the following components:   Troponin i, poc 0.15 (*)    All  other components within normal limits  POC OCCULT BLOOD, ED - Abnormal; Notable for the following components:   Fecal Occult Bld POSITIVE (*)    All other components within normal limits  LIPASE, BLOOD  OCCULT BLOOD X 1 CARD TO LAB, STOOL  PREPARE RBC (CROSSMATCH)  TYPE AND SCREEN    EKG EKG Interpretation  Date/Time:  Thursday July 08 2018 12:19:52 EDT Ventricular Rate:  94 PR Interval:    QRS Duration: 113 QT Interval:  357 QTC Calculation: 447 R Axis:   47 Text Interpretation:  Sinus or ectopic atrial rhythm Atrial premature complex Prolonged PR interval Borderline intraventricular conduction delay Repol abnrm, severe global ischemia (LM/MVD) Confirmed by Julianne Rice [phone]) on 07/08/2018 12:26:38 PM   Radiology Dg Chest Port 1 View  Result Date: 07/08/2018 CLINICAL DATA:  Shortness of breath EXAM: PORTABLE CHEST 1 VIEW COMPARISON:  March 31, 2018 FINDINGS: Heart is mildly enlarged with mild pulmonary venous hypertension. No edema or consolidation. No adenopathy. No bone lesions. IMPRESSION: Pulmonary vascular congestion without edema or consolidation. Electronically Signed   By: Lowella Grip III M.D.   On: 07/08/2018 13:15    Procedures Procedures (including critical care time)  Medications Ordered in ED Medications  nitroGLYCERIN (NITROSTAT) SL tablet 0.4 mg (has no administration in time range)  0.9 %  sodium chloride infusion (has no administration in time range)  sodium chloride flush (NS) 0.9 % injection 3 mL (3 mLs Intravenous Given 07/08/18 1248)  aspirin chewable tablet 324 mg (324 mg Oral Given 07/08/18 1254)  pantoprazole (PROTONIX) injection 40 mg (40 mg Intravenous Given 07/08/18 1409)   CRITICAL CARE Performed by: Julianne Rice Total critical care time: 35 minutes Critical care time was exclusive of separately billable procedures and treating other patients. Critical care was necessary to treat or prevent imminent or life-threatening  deterioration. Critical care was time spent personally by me on the following activities: development of treatment plan with patient and/or surrogate as well as nursing, discussions with consultants, evaluation of patient's response to treatment, examination of patient, obtaining history from patient or surrogate, ordering and performing treatments and interventions, ordering and review of laboratory studies, ordering and review of radiographic studies, pulse oximetry and re-evaluation of patient's condition.  Initial Impression / Assessment and Plan / ED Course  I have reviewed the triage vital signs and the nursing notes.  Pertinent labs & imaging results that were available during my care of the patient were reviewed by me and considered in my medical decision making (see chart for details).        Concern for ACS.  Patient has diffuse ST segment depression on his EKG which is new compared to his baseline.  Will discuss with cardiology.  Patient with hemoglobin of 5.5 and guaiac positive stools.  Stool noted to be dark in color.  Suspect upper gastrointestinal bleed.  Will initiate blood transfusion and start IV Protonix.  Discussed with hospitalist will see patient in the emergency department and admit. Final Clinical Impressions(s) / ED Diagnoses   Final diagnoses:  Symptomatic anemia  Upper GI bleed  NSTEMI (non-ST elevated myocardial infarction) [facility])    ED Discharge Orders    None       Julianne Rice, MD 07/08/18 1434

## 2018-07-08 NOTE — H&P (Deleted)
Cardiology Admission History and Physical:   Patient ID: Michael Stephens MRN: [id number]; DOB: January 21, 1935   Admission date: 07/08/2018  Primary Care Provider: [facility], [facility] Primary Cardiologist: Carlyle Dolly, MD  Primary Electrophysiologist:  None   Chief Complaint:  Chest pain  Patient Profile:   Michael Stephens is a 84 y.o. male with history of atrial arrhythmias including afib/aflutter/MAT, chronic diastolic HF, COPD presents with chest pain.   History of Present Illness:   Michael Stephens is 84 yo male history of atrial arrythmias including afib/aflutter/MAT, chronic diastolic HF, COPD, abnormal stress test 2018 intermediate risk managed medcally presents with chest pain.  Prior intermediate risk nuclear stress in 2018. Symptoms resolved with medical therapy alone, did not pursue cath.   Presents with primarily epigastric and upper abominal pain. Reports symptoms on and off for some time, progressing. Worst after eating. Severe episode this AM after eating cookies. 9/10 pressure epigastric into upper abdomen, constant since 830AM (now at 6 hours), worst with position. Increased belching. Significant SOB associated, also with DOE with activities recently.    WBC 7.1 Hgb 5.5 Plt 270 K 3.5 Cr 0.83 INR 2.4  EKG: SR, ST elevation aVR, diffuse ST depression CXR pulm congestion Trop 0.20-->  03/2018 echo LVEF 55-60%, no WMAs, restrictive diastolic function, mild AS 02/2017 nuclear stress: inferior/inferolateral infarct with mild to moderate peri-infarct ischemia     Past Medical History:  Diagnosis Date  . Acute diastolic CHF (congestive heart failure) ([place]) 02/11/2017  . Atrial flutter ([place])    a. diagnosed in 02/2017. Rate-control strategy pursued.   Marland Kitchen BPH (benign prostatic hyperplasia)   . Chronic diastolic heart failure ([place])   . Hypertension   . Renal disorder    cyst on kidney    Past Surgical History:  Procedure Laterality Date  . ABCESS DRAINAGE  11/2008    BUTTOCKS  . CATARACT EXTRACTION, BILATERAL    . COLONOSCOPY N/A 04/02/2017   Procedure: COLONOSCOPY;  Surgeon: Rogene Houston, MD;  Location: [facility];  Service: Endoscopy;  Laterality: N/A;  10:55  . POLYPECTOMY  04/02/2017   Procedure: POLYPECTOMY;  Surgeon: Rogene Houston, MD;  Location: [facility];  Service: Endoscopy;;  asceding colon(hot snare)/ sigmoid colon times two (cold snare)     Medications Prior to Admission: Prior to Admission medications   Medication Sig Start Date End Date Taking? Authorizing Provider  famotidine (PEPCID) 40 MG tablet Take 1 tablet (40 mg total) by mouth daily. 03/29/18 06/24/19  Long, Wonda Olds, MD  finasteride (PROSCAR) 5 MG tablet Take 5 mg by mouth daily.    [provider]  furosemide (LASIX) 80 MG tablet Take 1 tablet (80 mg total) by mouth daily. 06/24/18   Arnoldo Lenis, MD  losartan (COZAAR) 25 MG tablet Take 1 tablet (25 mg total) by mouth daily. 09/09/17 06/24/19  Johnson, Clanford L, MD  metoprolol tartrate (LOPRESSOR) 25 MG tablet Take 1 tablet (25 mg total) by mouth 2 (two) times daily. 04/04/18   Orson Eva, MD  potassium chloride (K-DUR,KLOR-CON) 20 MEQ tablet Take 1 tablet (20 mEq total) by mouth daily. 09/09/17 06/24/19  Johnson, Clanford L, MD  tamsulosin (FLOMAX) 0.4 MG CAPS capsule Take 0.4 mg by mouth daily.     [provider]  vitamin B-12 (CYANOCOBALAMIN) 500 MCG tablet Take 1 tablet (500 mcg total) by mouth daily. 04/05/18   Orson Eva, MD  warfarin (COUMADIN) 5 MG tablet Take 1 tablet daily except 1/2 tablet on Mondays and Fridays  or as directed Patient taking differently: Take 2.5-5 mg by mouth See admin instructions. Take 1 tablet daily except 1/2 tablet on Mondays and Fridays or as directed 03/08/18   Arnoldo Lenis, MD     Allergies:    Allergies  Allergen Reactions  . Penicillins     Has patient had a PCN reaction causing immediate rash, facial/tongue/throat swelling, SOB or lightheadedness  with hypotension: Yes Has patient had a PCN reaction causing severe rash involving mucus membranes or skin necrosis: No Has patient had a PCN reaction that required hospitalization: No Has patient had a PCN reaction occurring within the last 10 years: No If all of the above answers are "NO", then may proceed with Cephalosporin use.    Social History:   Social History   Socioeconomic History  . Marital status: Married    Spouse name: Not on file  . Number of children: Not on file  . Years of education: Not on file  . Highest education level: Not on file  Occupational History  . Not on file  Social Needs  . Financial resource strain: Not on file  . Food insecurity:    Worry: Not on file    Inability: Not on file  . Transportation needs:    Medical: Not on file    Non-medical: Not on file  Tobacco Use  . Smoking status: Former Smoker    Types: Cigarettes    Last attempt to quit: 2006    Years since quitting: 14.2  . Smokeless tobacco: Never Used  Substance and Sexual Activity  . Alcohol use: No  . Drug use: No  . Sexual activity: Never  Lifestyle  . Physical activity:    Days per week: Not on file    Minutes per session: Not on file  . Stress: Not on file  Relationships  . Social connections:    Talks on phone: Not on file    Gets together: Not on file    Attends religious service: Not on file    Active member of club or organization: Not on file    Attends meetings of clubs or organizations: Not on file    Relationship status: Not on file  . Intimate partner violence:    Fear of current or ex partner: Not on file    Emotionally abused: Not on file    Physically abused: Not on file    Forced sexual activity: Not on file  Other Topics Concern  . Not on file  Social History Narrative  . Not on file    Family History:  The patient's family history includes CAD in his father; CVA in his father; Diabetes in his sister. There is no history of Colon cancer.    ROS:   Please see the history of present illness.  All other ROS reviewed and negative.     Physical Exam/Data:   Vitals:   07/08/18 1218 07/08/18 1221  BP:  126/67  Pulse:  95  Resp:  (!) 26  Temp:  97.6 F (36.4 C)  TempSrc:  Oral  SpO2:  98%  Weight: 91.6 kg   Height: 6' (1.829 m)     Intake/Output Summary (Last 24 hours) at 07/08/2018 1259 Last data filed at 07/08/2018 1248 Gross per 24 hour  Intake 3 ml  Output -  Net 3 ml   Last 3 Weights 07/08/2018 06/24/2018 04/04/2018  Weight (lbs) 202 lb 212 lb 9.6 oz 201 lb 8 oz  Weight (kg) 91.627  kg 96.435 kg 91.4 kg     Body mass index is 27.4 kg/m.  General:  Well nourished, well developed, in no acute distress HEENT: normal Lymph: no adenopathy Neck: no JVD Endocrine:  No thryomegaly Cardiac:  normal S1, S2; RRR; no murmur Lungs:  clear to auscultation bilaterally, no wheezing, rhonchi or rales  Abd: soft, nontender, no hepatomegaly  Ext: trace bilateral edema Musculoskeletal:  No deformities, BUE and BLE strength normal and equal Skin: warm and dry  Neuro:  CNs 2-12 intact, no focal abnormalities noted Psych:  Normal affect      Laboratory Data:  ChemistryNo results for input(s): NA, K, CL, CO2, GLUCOSE, BUN, CREATININE, CALCIUM, GFRNONAA, GFRAA, ANIONGAP in the last 168 hours.  No results for input(s): PROT, ALBUMIN, AST, ALT, ALKPHOS, BILITOT in the last 168 hours. HematologyNo results for input(s): WBC, RBC, HGB, HCT, MCV, MCH, MCHC, RDW, PLT in the last 168 hours. Cardiac EnzymesNo results for input(s): TROPONINI in the last 168 hours. No results for input(s): TROPIPOC in the last 168 hours.  BNPNo results for input(s): BNP, PROBNP in the last 168 hours.  DDimer No results for input(s): DDIMER in the last 168 hours.  Radiology/Studies:  No results found.  Assessment and Plan:   1. Epigastric pain/Abdominal pain/Elevated troponin - symptoms most suggestive of GI pathology. Epigastric/upper abdominal pain  brought on with eating that lasts for hours. Elevated troponin and EKG changes in setting of severe anemia Hgb 5.5  - prior intermediate risk stress test 2018, symptoms resolved with medical therapy and did not pursue cath - presents with significant EKG changes, aVR ST elevation and diffuse ST depression suggesting LM or multivessel disease. Mild troponin elevation thus far  - I suspect presentation is likely due to chronic obstructive CAD as referenced by prior stress testing, combined with decreased supply related to severe anemia. I think acute obstructive CAD is less likely though his EKG is quite abnormal. Currently pain is improving.  - continue medical therapy with lopressor, ARB. No statin given prior side effects. INR is therapeutic, with anemia would avoid heparin at this time. Follow trops and EKGs as he is transfused and anemia is managed. At this time would not consider cath based on current information/status, work to optimize oxygen supply with transfusion and follow status  2. Severe anemia - recent dark stools he attributes to pepto bismol - chronic anemia that has been trending down, he denies signs of active acute bleeding.  - transfusion and management per ER/hospitalist teams. His epigastric symptoms would support possible upper GI bleed, consider GI evaluation.  -  if reversal is coumadin is felt neccesary by primary team ok from cardiac standpoint. May consider transfusin and follow trends prior to reversal, his presentation seems to be subacute, certaintly if signs of active bleeding would reverse with vitamin K.    3. Atrial arrhythmias - history of afib/aflutter/MAT - Sinus rhythm currently, has been on coumadin at home.  - hold coumadin in setting of severe anemia.   Given multiple acute medical conditions with some high risk findings that could decompensate and likely multiple subspecialites needing to be involved, would consider transfer to [facility] to step down.     Severity of Illness:  For questions or updates, please contact [place] Please consult [email or web address] for contact info under        Signed, Carlyle Dolly, MD  07/08/2018 12:59 PM

## 2018-07-08 NOTE — ED Notes (Addendum)
ED TO INPATIENT HANDOFF REPORT  ED Nurse Name and Phone #: Gaspar Bidding [phone]  S Name/Age/Gender Michael Stephens 84 y.o. male Room/Bed: APA09/APA09  Code Status   Code Status: Prior  Home/SNF/Other Home Patient oriented to: self, place, time and situation Is this baseline? Yes   Triage Complete: Triage complete  Chief Complaint Chest Pain  Triage Note Pt states he woke up epigastric pain and sob for past few days.  Pt with hx COPD.  Pt denies any travel and denies contacts that have traveled.    Allergies Allergies  Allergen Reactions  . Penicillins     Has patient had a PCN reaction causing immediate rash, facial/tongue/throat swelling, SOB or lightheadedness with hypotension: Yes Has patient had a PCN reaction causing severe rash involving mucus membranes or skin necrosis: No Has patient had a PCN reaction that required hospitalization: No Has patient had a PCN reaction occurring within the last 10 years: No If all of the above answers are "NO", then may proceed with Cephalosporin use.    Level of Care/Admitting Diagnosis ED Disposition    ED Disposition Condition Comment   Admit  Hospital Area: [place] [[id number]]  Level of Care: Progressive [102]  Diagnosis: GI bleeding [034742]  Admitting Physician: [place], [place]  Attending Physician: Kathie Dike [[id number]]  Estimated length of stay: past midnight tomorrow  Certification:: I certify this patient will need inpatient services for at least 2 midnights  PT Class (Do Not Modify): Inpatient [101]  PT Acc Code (Do Not Modify): Private [1]       B Medical/Surgery History Past Medical History:  Diagnosis Date  . Acute diastolic CHF (congestive heart failure) ([place]) 02/11/2017  . Atrial flutter ([place])    a. diagnosed in 02/2017. Rate-control strategy pursued.   Marland Kitchen BPH (benign prostatic hyperplasia)   . Chronic diastolic heart failure ([place])   . Hypertension   . Renal disorder    cyst on  kidney   Past Surgical History:  Procedure Laterality Date  . ABCESS DRAINAGE  11/2008   BUTTOCKS  . CATARACT EXTRACTION, BILATERAL    . COLONOSCOPY N/A 04/02/2017   Procedure: COLONOSCOPY;  Surgeon: Rogene Houston, MD;  [facility];  Service: Endoscopy;  Laterality: N/A;  10:55  . POLYPECTOMY  04/02/2017   Procedure: POLYPECTOMY;  Surgeon: Rogene Houston, MD;  [facility];  Service: Endoscopy;;  asceding colon(hot snare)/ sigmoid colon times two (cold snare)     A IV Location/Drains/Wounds Patient Lines/Drains/Airways Status   Active Line/Drains/Airways    Name:   Placement date:   Placement time:   Site:   Days:   Peripheral IV 07/08/18 Right Antecubital   07/08/18    1306    Antecubital   less than 1   Peripheral IV 07/08/18 Right Hand   07/08/18    1403    Hand   less than 1          Intake/Output Last 24 hours  Intake/Output Summary (Last 24 hours) at 07/08/2018 1557 Last data filed at 07/08/2018 1248 Gross per 24 hour  Intake 3 ml  Output -  Net 3 ml    Labs/Imaging Results for orders placed or performed during the hospital encounter of 07/08/18 (from the past 48 hour(s))  CBC     Status: Abnormal   Collection Time: 07/08/18 12:45 PM  Result Value Ref Range   WBC 7.1 4.0 - 10.5 K/uL   RBC 2.08 (L) 4.22 - 5.81  MIL/uL   Hemoglobin 5.5 (LL) 13.0 - 17.0 g/dL    Comment: This critical result has verified and been called to [facility] by Lorette Ang on 03 26 2020 at 1332, and has been read back.  This critical result has verified and been called to [facility] by Lorette Ang on 03 26 2020 at 1333, and has been read back.     HCT 19.2 (L) 39.0 - 52.0 %   MCV 92.3 80.0 - 100.0 fL   MCH 26.4 26.0 - 34.0 pg   MCHC 28.6 (L) 30.0 - 36.0 g/dL   RDW 16.8 (H) 11.5 - 15.5 %   Platelets 270 150 - 400 K/uL   nRBC 0.4 (H) 0.0 - 0.2 %    Comment: Performed at [facility], [address] [place], [place] [address]  Troponin I - ONCE - STAT      Status: Abnormal   Collection Time: 07/08/18 12:45 PM  Result Value Ref Range   Troponin I 0.20 (HH) <0.03 ng/mL    Comment: CRITICAL RESULT CALLED TO, READ BACK BY AND VERIFIED WITH: PATSHRAW,B AT 1341 ON 3.26.20 BY ISLEY,B Performed at [facility], [address], [place], [place] [address]   Comprehensive metabolic panel     Status: Abnormal   Collection Time: 07/08/18 12:45 PM  Result Value Ref Range   Sodium 135 135 - 145 mmol/L   Potassium 3.5 3.5 - 5.1 mmol/L   Chloride 99 98 - 111 mmol/L   CO2 25 22 - 32 mmol/L   Glucose, Bld 183 (H) 70 - 99 mg/dL   BUN 30 (H) 8 - 23 mg/dL   Creatinine, Ser 0.83 0.61 - 1.24 mg/dL   Calcium 9.2 8.9 - 10.3 mg/dL   Total Protein 6.1 (L) 6.5 - 8.1 g/dL   Albumin 3.6 3.5 - 5.0 g/dL   AST 23 15 - 41 U/L   ALT 18 0 - 44 U/L   Alkaline Phosphatase 55 38 - 126 U/L   Total Bilirubin 0.2 (L) 0.3 - 1.2 mg/dL   GFR calc non Af Amer >60 >60 mL/min   GFR calc Af Amer >60 >60 mL/min   Anion gap 11 5 - 15    Comment: Performed at [facility], [address] [place], [place] [address]  Lipase, blood     Status: None   Collection Time: 07/08/18 12:45 PM  Result Value Ref Range   Lipase 26 11 - 51 U/L    Comment: Performed at [facility], [address] [place], [place] [address]  Protime-INR     Status: Abnormal   Collection Time: 07/08/18 12:45 PM  Result Value Ref Range   Prothrombin Time 25.6 (H) 11.4 - 15.2 seconds   INR 2.4 (H) 0.8 - 1.2    Comment: (NOTE) INR goal varies based on device and disease states. Performed at [facility], [address], [place], [place] [address]   I-stat troponin, ED     Status: Abnormal   Collection Time: 07/08/18 12:52 PM  Result Value Ref Range   Troponin i, poc 0.15 (HH) 0.00 - 0.08 ng/mL   Comment 3            Comment: Due to the release kinetics of cTnI, a negative result within the first hours of the onset of symptoms does not rule out myocardial infarction with certainty. If myocardial  infarction is still suspected, repeat the test at appropriate intervals.   POC occult blood, ED     Status:  Abnormal   Collection Time: 07/08/18  1:52 PM  Result Value Ref Range   Fecal Occult Bld POSITIVE (A) NEGATIVE  Prepare RBC     Status: None   Collection Time: 07/08/18  2:17 PM  Result Value Ref Range   Order Confirmation      ORDER PROCESSED BY BLOOD BANK Performed at [facility], [address], [place], [place] [address]   Type and screen [facility]     Status: None (Preliminary result)   Collection Time: 07/08/18  2:17 PM  Result Value Ref Range   ABO/RH(D) O NEG    Antibody Screen NEG    Sample Expiration 07/11/2018    Unit Number [id number]    Blood Component Type RED CELLS,LR    Unit division 00    Status of Unit ALLOCATED    Transfusion Status OK TO TRANSFUSE    Crossmatch Result      Compatible Performed at [facility], [address], [place], [place] [address]   ABO/Rh     Status: None   Collection Time: 07/08/18  2:17 PM  Result Value Ref Range   ABO/RH(D)      Jenetta Downer NEG Performed at [facility], [address], [place], [place] [address]    Dg Chest Port 1 View  Result Date: 07/08/2018 CLINICAL DATA:  Shortness of breath EXAM: PORTABLE CHEST 1 VIEW COMPARISON:  March 31, 2018 FINDINGS: Heart is mildly enlarged with mild pulmonary venous hypertension. No edema or consolidation. No adenopathy. No bone lesions. IMPRESSION: Pulmonary vascular congestion without edema or consolidation. Electronically Signed   By: Lowella Grip III M.D.   On: 07/08/2018 13:15    Pending Labs Unresulted Labs (From admission, onward)    Start     Ordered   07/08/18 1336  Occult blood card to lab, stool  Once,   STAT     07/08/18 1336   Signed and Held  CBC  Tomorrow morning,   R     Signed and Held   Signed and Held  Basic metabolic panel  Tomorrow morning,   R     Signed and Held   Signed and Held  Protime-INR  Tomorrow morning,   R     Signed and Held    Signed and Held  CBC  Now then every 8 hours,   R     Signed and Held   Signed and Held  Troponin I - Now Then Q6H  Now then every 6 hours,   R     Signed and Held          Vitals/Pain Today's Vitals   07/08/18 1300 07/08/18 1300 07/08/18 1303 07/08/18 1304  BP: 101/81     Pulse: 94     Resp: 19     Temp:      TempSrc:      SpO2: (!) 88%  (!) 84% 100%  Weight:      Height:      PainSc:  6       Isolation Precautions No active isolations  Medications Medications  nitroGLYCERIN (NITROSTAT) SL tablet 0.4 mg (has no administration in time range)  0.9 %  sodium chloride infusion (has no administration in time range)  sodium chloride flush (NS) 0.9 % injection 3 mL (3 mLs Intravenous Given 07/08/18 1248)  aspirin chewable tablet 324 mg (324 mg Oral Given 07/08/18 1254)  pantoprazole (PROTONIX) injection 40 mg (40 mg Intravenous Given 07/08/18 1409)    Mobility  walks Low fall risk   Focused Assessments Cardiac Assessment Handoff:    Lab Results  Component Value Date   TROPONINI 0.20 ([place]) 07/08/2018   Lab Results  Component Value Date   DDIMER 0.42 03/31/2018   Does the Patient currently have chest pain? No     R Recommendations: See Admitting Provider Note  Report given to:   Additional Notes:

## 2018-07-08 NOTE — H&P (Signed)
History and Physical    Michael Stephens IEP:[id number] DOB: 10-Jun-1934 DOA: 07/08/2018  PCP: [facility]  Patient coming from: Home  I have personally briefly reviewed patient's old medical records in [place]  Chief Complaint: Epigastric discomfort with left-sided arm pain  HPI: Michael Stephens is a 84 y.o. male with medical history significant of coronary artery disease, atrial flutter on anticoagulation, chronic diastolic heart failure, BPH, presents to the hospital with complaints of epigastric discomfort and left arm pain.  Patient is a poor historian and is not clear on timelines.  He reports that this morning he noted left-sided arm pain which caused him to come to the hospital.  He describes epigastric discomfort which radiates down his abdomen.  This is been occurring for several days to weeks.  This usually worsens after he eats or drinks something.  Is associated with dyspepsia.  He has had nausea, but no vomiting.  He also describes having dark stools, but attributes that to taking Pepto-Bismol.  He has not noticed any frank hematochezia.  He has noted shortness of breath on exertion as well as dizziness for the past several days to weeks.  He denies taking any NSAIDs.  He has not had any fever or cough.  ED Course: In the emergency room, EKG showed diffuse ST depressions.  He had mildly elevated troponin of 0.2.  Further work-up including CBC showed a hemoglobin of 5.5 with a baseline hemoglobin around 9.  Stools were noted to be Hemoccult positive.  He had mild elevation of BUN at 30.  INR was noted to be elevated above 2.  Cardiology was consulted who recommended patient be transferred to [facility] since he may need multidisciplinary care.  Review of Systems: As per HPI otherwise 10 point review of systems negative.    Past Medical History:  Diagnosis Date  . Acute diastolic CHF (congestive heart failure) ([place]) 02/11/2017  . Atrial flutter ([place])    a.  diagnosed in 02/2017. Rate-control strategy pursued.   Marland Kitchen BPH (benign prostatic hyperplasia)   . Chronic diastolic heart failure ([place])   . Hypertension   . Renal disorder    cyst on kidney    Past Surgical History:  Procedure Laterality Date  . ABCESS DRAINAGE  11/2008   BUTTOCKS  . CATARACT EXTRACTION, BILATERAL    . COLONOSCOPY N/A 04/02/2017   Procedure: COLONOSCOPY;  Surgeon: Rogene Houston, MD;  [facility];  Service: Endoscopy;  Laterality: N/A;  10:55  . POLYPECTOMY  04/02/2017   Procedure: POLYPECTOMY;  Surgeon: Rogene Houston, MD;  [facility];  Service: Endoscopy;;  asceding colon(hot snare)/ sigmoid colon times two (cold snare)    Social History:  reports that he quit smoking about 14 years ago. His smoking use included cigarettes. He has never used smokeless tobacco. He reports that he does not drink alcohol or use drugs.  Allergies  Allergen Reactions  . Penicillins     Has patient had a PCN reaction causing immediate rash, facial/tongue/throat swelling, SOB or lightheadedness with hypotension: Yes Has patient had a PCN reaction causing severe rash involving mucus membranes or skin necrosis: No Has patient had a PCN reaction that required hospitalization: No Has patient had a PCN reaction occurring within the last 10 years: No If all of the above answers are "NO", then may proceed with Cephalosporin use.    Family History  Problem Relation Age of Onset  . CVA Father   . CAD  Father   . Diabetes Sister   . Colon cancer Neg Hx     Prior to Admission medications   Medication Sig Start Date End Date Taking? Authorizing Provider  budesonide-formoterol (SYMBICORT) 160-4.5 MCG/ACT inhaler Inhale 2 puffs into the lungs 2 (two) times daily.   Yes [provider]  famotidine (PEPCID) 40 MG tablet Take 1 tablet (40 mg total) by mouth daily. 03/29/18 06/24/19 Yes Long, Wonda Olds, MD  finasteride (PROSCAR) 5 MG tablet Take 5 mg by mouth  daily.   Yes [provider]  furosemide (LASIX) 80 MG tablet Take 1 tablet (80 mg total) by mouth daily. 06/24/18  Yes Branch, Alphonse Guild, MD  losartan (COZAAR) 25 MG tablet Take 1 tablet (25 mg total) by mouth daily. 09/09/17 06/24/19 Yes Johnson, Clanford L, MD  metoprolol tartrate (LOPRESSOR) 25 MG tablet Take 1 tablet (25 mg total) by mouth 2 (two) times daily. 04/04/18  Yes Tat, Shanon Brow, MD  potassium chloride (K-DUR,KLOR-CON) 20 MEQ tablet Take 1 tablet (20 mEq total) by mouth daily. 09/09/17 06/24/19 Yes Johnson, Clanford L, MD  tamsulosin (FLOMAX) 0.4 MG CAPS capsule Take 0.4 mg by mouth daily.    Yes [provider]  warfarin (COUMADIN) 5 MG tablet Take 1 tablet daily except 1/2 tablet on Mondays and Fridays or as directed Patient taking differently: Take 2.5-5 mg by mouth See admin instructions. Take 5 mg daily except for 2.5 mg  on Mondays and Fridays 03/08/18  Yes Branch, Alphonse Guild, MD  vitamin B-12 (CYANOCOBALAMIN) 500 MCG tablet Take 1 tablet (500 mcg total) by mouth daily. Patient not taking: Reported on 07/08/2018 04/05/18   Orson Eva, MD    Physical Exam: Vitals:   07/08/18 1221 07/08/18 1300 07/08/18 1303 07/08/18 1304  BP: 126/67 101/81    Pulse: 95 94    Resp: (!) 26 19    Temp: 97.6 F (36.4 C)     TempSrc: Oral     SpO2: 98% (!) 88% (!) 84% 100%  Weight:      Height:        Constitutional: NAD, calm, comfortable Eyes: PERRL, lids and conjunctivae normal ENMT: Mucous membranes are moist. Posterior pharynx clear of any exudate or lesions.Normal dentition.  Neck: normal, supple, no masses, no thyromegaly Respiratory: Crackles at bases. Normal respiratory effort. No accessory muscle use.  Cardiovascular: Regular rate and rhythm, no murmurs / rubs / gallops.  Trace to 1+ extremity edema. 2+ pedal pulses. No carotid bruits.  Abdomen: no tenderness, no masses palpated. No hepatosplenomegaly. Bowel sounds positive.  Musculoskeletal: no clubbing / cyanosis.  No joint deformity upper and lower extremities. Good ROM, no contractures. Normal muscle tone.  Skin: no rashes, lesions, ulcers. No induration Neurologic: CN 2-12 grossly intact. Sensation intact, DTR normal. Strength 5/5 in all 4.  Psychiatric: Normal judgment and insight. Alert and oriented x 3. Normal mood.    Labs on Admission: I have personally reviewed following labs and imaging studies  CBC: Recent Labs  Lab 07/08/18 1245  WBC 7.1  HGB 5.5*  HCT 19.2*  MCV 92.3  PLT 588   Basic Metabolic Panel: Recent Labs  Lab 07/08/18 1245  NA 135  K 3.5  CL 99  CO2 25  GLUCOSE 183*  BUN 30*  CREATININE 0.83  CALCIUM 9.2   GFR: Estimated Creatinine Clearance: 75.3 mL/min (by C-G formula based on SCr of 0.83 mg/dL). Liver Function Tests: Recent Labs  Lab 07/08/18 1245  AST 23  ALT 18  ALKPHOS 55  BILITOT 0.2*  PROT 6.1*  ALBUMIN 3.6   Recent Labs  Lab 07/08/18 1245  LIPASE 26   No results for input(s): AMMONIA in the last 168 hours. Coagulation Profile: Recent Labs  Lab 07/08/18 1245  INR 2.4*   Cardiac Enzymes: Recent Labs  Lab 07/08/18 1245  TROPONINI 0.20*   BNP (last 3 results) No results for input(s): PROBNP in the last 8760 hours. HbA1C: No results for input(s): HGBA1C in the last 72 hours. CBG: No results for input(s): GLUCAP in the last 168 hours. Lipid Profile: No results for input(s): CHOL, HDL, LDLCALC, TRIG, CHOLHDL, LDLDIRECT in the last 72 hours. Thyroid Function Tests: No results for input(s): TSH, T4TOTAL, FREET4, T3FREE, THYROIDAB in the last 72 hours. Anemia Panel: No results for input(s): VITAMINB12, FOLATE, FERRITIN, TIBC, IRON, RETICCTPCT in the last 72 hours. Urine analysis:    Component Value Date/Time   COLORURINE YELLOW 11/06/2017 0936   APPEARANCEUR HAZY (A) 11/06/2017 0936   LABSPEC 1.010 11/06/2017 0936   PHURINE 6.0 11/06/2017 0936   GLUCOSEU NEGATIVE 11/06/2017 0936   HGBUR SMALL (A) 11/06/2017 0936   BILIRUBINUR  NEGATIVE 11/06/2017 0936   KETONESUR NEGATIVE 11/06/2017 0936   PROTEINUR NEGATIVE 11/06/2017 0936   NITRITE POSITIVE (A) 11/06/2017 0936   LEUKOCYTESUR LARGE (A) 11/06/2017 0936    Radiological Exams on Admission: Dg Chest Port 1 View  Result Date: 07/08/2018 CLINICAL DATA:  Shortness of breath EXAM: PORTABLE CHEST 1 VIEW COMPARISON:  March 31, 2018 FINDINGS: Heart is mildly enlarged with mild pulmonary venous hypertension. No edema or consolidation. No adenopathy. No bone lesions. IMPRESSION: Pulmonary vascular congestion without edema or consolidation. Electronically Signed   By: Lowella Grip III M.D.   On: 07/08/2018 13:15    EKG: Independently reviewed.  Diffuse ST depressions  Assessment/Plan Active Problems:   Paroxysmal atrial flutter (HCC)   Essential hypertension   Demand ischemia (HCC)   Anemia   Heme positive stool   Coagulopathy (HCC)   Chronic diastolic CHF (congestive heart failure) (HCC)   GI bleeding     1. Chest discomfort with mildly elevated troponin.  I suspect his left arm discomfort and mildly elevated troponin is related to demand ischemia in the setting of severe anemia.  Although he does have known coronary disease.  Cardiology is following.  He received aspirin in the emergency room.  Since he is already anticoagulated, he will not be continued on heparin.  Blood pressure is soft and will hold off on beta-blockers, but this can be resumed as blood pressure is improved. 2. Anemia, suspect related to blood loss.  Baseline hemoglobin of around 9.  Currently hemoglobin is 5.5 with heme positive stools.  2 units PRBCs have been ordered in the emergency room.  He will be started on Protonix.  Case was discussed with Dr. Michail Sermon, GI who will see the patient at [facility].  Keep the patient on clear liquids for now 3. Atrial fibrillation, paroxysmal.  Currently in sinus rhythm.  Will hold further anticoagulation.  If he has signs of active bleeding, can  consider active reversal.  He is not appear to be actively bleeding at this time.  Will hold off on metoprolol until blood pressure has stabilized.  Currently, blood pressure is soft. 4. Chronic diastolic heart failure.  Holding off on ACE inhibitor, beta-blockers and Lasix since blood pressures are marginal. 5. Hypertension.  Blood pressures are soft.  Holding off on antihypertensives.  DVT prophylaxis: SCDs Code Status: full  code, confirmed with patient  Family Communication: no family present  Disposition Plan: discharge home once hemoglobin stable and cardiac work up complete  Consults called: Cardiology, Dr. Harl Bowie, GI, Dr. Michail Sermon  Admission status: inpatient, progressive care   Kathie Dike MD [facility]   If 7PM-7AM, please contact night-coverage [email or web address]   07/08/2018, 3:41 PM

## 2018-07-09 ENCOUNTER — Encounter (HOSPITAL_COMMUNITY)
Admission: EM | Disposition: A | Discharge status: Home / Self Care | Payer: Self-pay | Source: Home / Self Care | Attending: Internal Medicine

## 2018-07-09 ENCOUNTER — Inpatient Hospital Stay (HOSPITAL_COMMUNITY): Payer: Medicare PPO | Admitting: Certified Registered Nurse Anesthetist

## 2018-07-09 ENCOUNTER — Inpatient Hospital Stay (HOSPITAL_COMMUNITY): Payer: Medicare PPO

## 2018-07-09 ENCOUNTER — Encounter (HOSPITAL_COMMUNITY): Payer: Self-pay

## 2018-07-09 DIAGNOSIS — D689 Coagulation defect, unspecified: Secondary | ICD-10-CM

## 2018-07-09 DIAGNOSIS — I5032 Chronic diastolic (congestive) heart failure: Secondary | ICD-10-CM

## 2018-07-09 DIAGNOSIS — K922 Gastrointestinal hemorrhage, unspecified: Secondary | ICD-10-CM

## 2018-07-09 DIAGNOSIS — I4892 Unspecified atrial flutter: Secondary | ICD-10-CM

## 2018-07-09 DIAGNOSIS — I214 Non-ST elevation (NSTEMI) myocardial infarction: Secondary | ICD-10-CM | POA: Diagnosis present

## 2018-07-09 DIAGNOSIS — R9431 Abnormal electrocardiogram [ECG] [EKG]: Secondary | ICD-10-CM

## 2018-07-09 DIAGNOSIS — D500 Iron deficiency anemia secondary to blood loss (chronic): Secondary | ICD-10-CM

## 2018-07-09 HISTORY — PX: ESOPHAGOGASTRODUODENOSCOPY (EGD) WITH PROPOFOL: SHX5813

## 2018-07-09 LAB — CBC
HCT: 21.5 % — ABNORMAL LOW (ref 39.0–52.0)
HCT: 25.3 % — ABNORMAL LOW (ref 39.0–52.0)
Hemoglobin: 6.4 g/dL — CL (ref 13.0–17.0)
Hemoglobin: 7.8 g/dL — ABNORMAL LOW (ref 13.0–17.0)
MCH: 26.3 pg (ref 26.0–34.0)
MCH: 27.4 pg (ref 26.0–34.0)
MCHC: 29.8 g/dL — ABNORMAL LOW (ref 30.0–36.0)
MCHC: 30.8 g/dL (ref 30.0–36.0)
MCV: 88.5 fL (ref 80.0–100.0)
MCV: 88.8 fL (ref 80.0–100.0)
PLATELETS: 263 10*3/uL (ref 150–400)
Platelets: 247 10*3/uL (ref 150–400)
RBC: 2.43 MIL/uL — ABNORMAL LOW (ref 4.22–5.81)
RBC: 2.85 MIL/uL — ABNORMAL LOW (ref 4.22–5.81)
RDW: 16.2 % — ABNORMAL HIGH (ref 11.5–15.5)
RDW: 16.1 % — AB (ref 11.5–15.5)
WBC: 9.2 10*3/uL (ref 4.0–10.5)
WBC: 9.4 10*3/uL (ref 4.0–10.5)
nRBC: 0.5 % — ABNORMAL HIGH (ref 0.0–0.2)
nRBC: 0.4 % — ABNORMAL HIGH (ref 0.0–0.2)

## 2018-07-09 LAB — PROTIME-INR
INR: 1.9 — ABNORMAL HIGH (ref 0.8–1.2)
PROTHROMBIN TIME: 21.7 s — AB (ref 11.4–15.2)

## 2018-07-09 LAB — TROPONIN I
Troponin I: 60.09 ng/mL (ref <0.03)
Troponin I: 42.84 ng/mL (ref <0.03)
Troponin I: 25.48 ng/mL (ref <0.03)

## 2018-07-09 LAB — FIBRINOGEN: Fibrinogen: 438 mg/dL (ref 210–475)

## 2018-07-09 LAB — BASIC METABOLIC PANEL
Anion gap: 10 (ref 5–15)
BUN: 20 mg/dL (ref 8–23)
CO2: 26 mmol/L (ref 22–32)
Calcium: 9.3 mg/dL (ref 8.9–10.3)
Chloride: 100 mmol/L (ref 98–111)
Creatinine, Ser: 0.91 mg/dL (ref 0.61–1.24)
GFR calc Af Amer: >60 mL/min (ref >60)
Glucose, Bld: 136 mg/dL — ABNORMAL HIGH (ref 70–99)
POTASSIUM: 4 mmol/L (ref 3.5–5.1)
Sodium: 136 mmol/L (ref 135–145)

## 2018-07-09 LAB — BRAIN NATRIURETIC PEPTIDE: B Natriuretic Peptide: 769.3 pg/mL — ABNORMAL HIGH (ref 0.0–100.0)

## 2018-07-09 LAB — PREPARE RBC (CROSSMATCH)

## 2018-07-09 LAB — ECHOCARDIOGRAM LIMITED
Height: 72 in
Weight: 3440 oz

## 2018-07-09 LAB — ABO/RH: ABO/RH(D): O NEG

## 2018-07-09 SURGERY — ESOPHAGOGASTRODUODENOSCOPY (EGD) WITH PROPOFOL
Anesthesia: General

## 2018-07-09 MED ORDER — LIDOCAINE 2% (20 MG/ML) 5 ML SYRINGE
INTRAMUSCULAR | Status: DC | PRN
  Administered 2018-07-09: 60 mg via INTRAVENOUS

## 2018-07-09 MED ORDER — ONDANSETRON HCL 4 MG/2ML IJ SOLN
INTRAMUSCULAR | Status: DC | PRN
  Administered 2018-07-09: 4 mg via INTRAVENOUS

## 2018-07-09 MED ORDER — LACTATED RINGERS IV SOLN
INTRAVENOUS | Status: DC
  Administered 2018-07-09: 13:00:00 via INTRAVENOUS

## 2018-07-09 MED ORDER — SODIUM CHLORIDE 0.9% IV SOLUTION
Freq: Once | INTRAVENOUS | Status: AC
  Administered 2018-07-09: 02:00:00 via INTRAVENOUS

## 2018-07-09 MED ORDER — SODIUM CHLORIDE 0.9 % IV SOLN: INTRAVENOUS | Status: DC

## 2018-07-09 MED ORDER — SUCCINYLCHOLINE CHLORIDE 200 MG/10ML IV SOSY
PREFILLED_SYRINGE | INTRAVENOUS | Status: DC | PRN
  Administered 2018-07-09: 120 mg via INTRAVENOUS

## 2018-07-09 MED ORDER — ETOMIDATE 2 MG/ML IV SOLN
INTRAVENOUS | Status: DC | PRN
  Administered 2018-07-09: 20 mg via INTRAVENOUS

## 2018-07-09 SURGICAL SUPPLY — 15 items

## 2018-07-09 NOTE — Op Note (Signed)
[facility] Patient Name: Michael Stephens Procedure Date : 07/09/2018 MRN: [id number] Attending MD: Lear Ng , MD Date of Birth: 04/26/1934 CSN: [id number] Age: 84 Admit Type: Inpatient Procedure:                Upper GI endoscopy Indications:              Suspected upper gastrointestinal bleeding, Acute                            post hemorrhagic anemia, Melena Providers:                Lear Ng, MD, Grace Isaac, RN, Zenon Mayo, RN, Cletis Athens, Technician Referring MD:             hospital team Medicines:                Propofol per Anesthesia, Monitored Anesthesia Care Complications:            No immediate complications. Estimated Blood Loss:     Estimated blood loss: none. Procedure:                Pre-Anesthesia Assessment:                           - Prior to the procedure, a History and Physical                            was performed, and patient medications and                            allergies were reviewed. The patient's tolerance of                            previous anesthesia was also reviewed. The risks                            and benefits of the procedure and the sedation                            options and risks were discussed with the patient.                            All questions were answered, and informed consent                            was obtained. Prior Anticoagulants: The patient has                            taken no previous anticoagulant or antiplatelet                            agents. ASA Grade Assessment: III - A patient with  severe systemic disease. After reviewing the risks                            and benefits, the patient was deemed in                            satisfactory condition to undergo the procedure.                           After obtaining informed consent, the endoscope was                            passed under direct vision.  Throughout the                            procedure, the patient's blood pressure, pulse, and                            oxygen saturations were monitored continuously. The                            GIF-H190 ([id number]) Olympus gastroscope was                            introduced through the mouth, and advanced to the                            second part of duodenum. The upper GI endoscopy was                            accomplished without difficulty. The patient                            tolerated the procedure well. Findings:      LA Grade B (one or more mucosal breaks greater than 5 mm, not extending       between the tops of two mucosal folds) esophagitis with no bleeding was       found at the gastroesophageal junction.      The Z-line was found 42 cm from the incisors.      Localized mild inflammation characterized by congestion (edema) was       found in the gastric antrum.      The cardia and gastric fundus were normal on retroflexion.      Patchy mild mucosal changes characterized by congestion and erythema       were found in the duodenal bulb.      The second portion of the duodenum was normal. Impression:               - LA Grade B reflux esophagitis.                           - Z-line, 42 cm from the incisors.                           - Acute gastritis.                           -  Mucosal changes in the duodenum.                           - Normal second portion of the duodenum.                           - No specimens collected. Recommendation:           - Soft diet.                           - Observe patient's clinical course. Procedure Code(s):        --- Professional ---                           [phone], Esophagogastroduodenoscopy, flexible,                            transoral; diagnostic, including collection of                            specimen(s) by brushing or washing, when performed                            (separate procedure) Diagnosis Code(s):         --- Professional ---                           D62, Acute posthemorrhagic anemia                           K92.1, Melena (includes Hematochezia)                           K29.00, Acute gastritis without bleeding                           K21.0, Gastro-esophageal reflux disease with                            esophagitis                           K31.89, Other diseases of stomach and duodenum CPT copyright 2019 [employer]. All rights reserved. The codes documented in this report are preliminary and upon coder review may  be revised to meet current compliance requirements. Lear Ng, MD 07/09/2018 2:15:35 PM This report has been signed electronically. Number of Addenda: 0

## 2018-07-09 NOTE — Transfer of Care (Signed)
Immediate Anesthesia Transfer of Care Note  Patient: Michael Stephens  Procedure(s) Performed: ESOPHAGOGASTRODUODENOSCOPY (EGD) WITH PROPOFOL (N/A )  Patient Location: Endoscopy Unit  Anesthesia Type:General  Level of Consciousness: awake, alert  and oriented  Airway & Oxygen Therapy: Patient Spontanous Breathing and Patient connected to face mask oxygen  Post-op Assessment: Report given to RN and Post -op Vital signs reviewed and stable  Post vital signs: Reviewed and stable  Last Vitals:  Vitals Value Taken Time  BP 152/80 07/09/2018  2:17 PM  Temp 37.3 C 07/09/2018  2:16 PM  Pulse 105 07/09/2018  2:22 PM  Resp 28 07/09/2018  2:22 PM  SpO2 89 % 07/09/2018  2:22 PM  Vitals shown include unvalidated device data.  Last Pain:  Vitals:   07/09/18 1416  TempSrc: Oral  PainSc: 0-No pain      Patients Stated Pain Goal: 0 (66/29/47 6546)  Complications: No apparent anesthesia complications

## 2018-07-09 NOTE — Anesthesia Preprocedure Evaluation (Signed)
Anesthesia Evaluation  Patient identified by MRN, date of birth, ID band Patient awake    Reviewed: Allergy & Precautions, H&P , NPO status , Patient's Chart, lab work & pertinent test results  Airway Mallampati: II   Neck ROM: full    Dental   Pulmonary shortness of breath, former smoker,    breath sounds clear to auscultation       Cardiovascular hypertension, + Past MI and +CHF  + dysrhythmias Atrial Fibrillation  Rhythm:regular Rate:Normal     Neuro/Psych    GI/Hepatic GI bleed   Endo/Other    Renal/GU      Musculoskeletal   Abdominal   Peds  Hematology  (+) Blood dyscrasia, anemia ,   Anesthesia Other Findings   Reproductive/Obstetrics                             Anesthesia Physical Anesthesia Plan  ASA: III  Anesthesia Plan: General   Post-op Pain Management:    Induction: Intravenous  PONV Risk Score and Plan: 2 and Ondansetron and Treatment may vary due to age or medical condition  Airway Management Planned: Oral ETT  Additional Equipment:   Intra-op Plan:   Post-operative Plan: Extubation in OR  Informed Consent: I have reviewed the patients History and Physical, chart, labs and discussed the procedure including the risks, benefits and alternatives for the proposed anesthesia with the patient or authorized representative who has indicated his/her understanding and acceptance.       Plan Discussed with: CRNA and Anesthesiologist  Anesthesia Plan Comments:         Anesthesia Quick Evaluation

## 2018-07-09 NOTE — Progress Notes (Signed)
**Note Michael-Identified via Obfuscation** [facility]                                                                              Patient Demographics  Michael Stephens, is a 84 y.o. male, DOB - 1935-03-11, OZD:[id number]  Admit date - 07/08/2018   Admitting Physician Kathie Dike, MD  Outpatient Primary MD for the patient is Practice, [facility]  Outpatient specialists:   LOS - 1  days   Medical records reviewed and are as summarized below:    Chief Complaint  Patient presents with  . Chest Pain       Brief summary   Patient is a 84 year old male with CAD, atrial flutter on anticoagulation, chronic diastolic CHF, BPH presented to ED with epigastric discomfort and left arm pain.  Patient reported on the morning of admission, left-sided arm pain and then epigastric discomfort radiating down to his abdomen, for several days to weeks.  Reported worsening after he eats or drinks anything, associated with dyspepsia, nausea but no vomiting.  He also described having dark stools but attributed to taking Pepto-Bismol.  Denied any frank hematochezia.  Denied taking any NSAIDs, fevers or cough.  He also reported dyspnea on exertion and dizziness for the last several days to weeks. In ED, EKG showed diffuse ST depressions, mildly elevated troponin, hemoglobin of 5.5 with baseline hemoglobin around 9, FOBT positive INR above 2.  Cardiology was consulted.   Assessment & Plan    Principal Problem:   NSTEMI (non-ST elevated myocardial infarction) [facility]) presenting with abdominal pain and chest pain -Troponin initial 0.2 -> 60.09-> 42.8.  Patient currently denies any active chest pain however concerning for ACS and worsened due to demand ischemia with profound anemia. -Cardiology consulted, not a candidate for invasive procedures at this time and unable to treat with IV heparin due to GI bleed -Recommended 2D echo  Active Problems: Upper GI bleed with acute on chronic blood loss anemia -Patient reported  epigastric discomfort with dyspepsia, melanotic stools for weeks -Hemoglobin 5.5 at the time of admission, received 2 units of packed RBCs, hemoglobin currently 7.8 -GI has been consulted, continue clear liquid diet, IV PPI, will await further recommendations -Currently denies any epigastric pain or overnight bloody stools    Paroxysmal atrial flutter (HCC) -Rate controlled, has been on Coumadin, INR 2.4 on admission -Coumadin held, INR 1.2 this morning    Essential hypertension -BP currently stable currently off antihypertensives  History of chronic diastolic CHF 2D echo 25/95 had shown EF of 55 to 60%, restrictive physiology with decreased left ventricular diastolic compliance. Currently compensated  Code Status: Full CODE STATUS DVT Prophylaxis:   SCD's Family Communication: Discussed in detail with the patient, all imaging results, lab results explained to the patient  Disposition Plan: Awaiting GI eval  Time Spent in minutes 35 minutes  Procedures:  None  Consultants:   Cardiology Gastroenterology, Dr. Michail Sermon  Antimicrobials:   Anti-infectives (From admission, onward)   None         Medications  Scheduled Meds: . finasteride  5 mg Oral Daily  . mometasone-formoterol  2 puff Inhalation BID  . pantoprazole (PROTONIX) IV  40  mg Intravenous Q12H  . sodium chloride flush  3 mL Intravenous Q12H  . tamsulosin  0.4 mg Oral Daily   Continuous Infusions: . sodium chloride     PRN Meds:.sodium chloride, acetaminophen **OR** acetaminophen, nitroGLYCERIN, ondansetron **OR** ondansetron (ZOFRAN) IV, sodium chloride flush      Subjective:   Michael Stephens was seen and examined today.  Patient denies chest pain, shortness of breath, abdominal pain, N/V/D/C, new weakness, numbess, tingling. No acute events overnight.    Objective:   Vitals:   07/09/18 0237 07/09/18 0530 07/09/18 0753 07/09/18 0821  BP: 123/75 123/69 119/75   Pulse: 88 87 75 83  Resp: 20 (!)  23    Temp: 98.3 F (36.8 C) 98.1 F (36.7 C) 98.9 F (37.2 C)   TempSrc: Oral Oral Oral   SpO2: 100% 93% 94% 94%  Weight:      Height:        Intake/Output Summary (Last 24 hours) at 07/09/2018 0907 Last data filed at 07/09/2018 0536 Gross per 24 hour  Intake 1020 ml  Output 675 ml  Net 345 ml     Wt Readings from Last 3 Encounters:  07/08/18 97.5 kg  06/24/18 96.4 kg  04/04/18 91.4 kg     Exam  General: Alert and oriented x 3, NAD  Eyes:   HEENT:  Atraumatic, normocephalic  Cardiovascular: S1 S2 auscultated,  Regular rate and rhythm.  Respiratory: Clear to auscultation bilaterally, no wheezing, rales or rhonchi  Gastrointestinal: Soft, nontender, nondistended, + bowel sounds  Ext: no pedal edema bilaterally  Neuro: No new FND's  Musculoskeletal: No digital cyanosis, clubbing  Skin: No rashes  Psych: Normal affect and demeanor, alert and oriented x3    Data Reviewed:  I have personally reviewed following labs and imaging studies  Micro Results No results found for this or any previous visit (from the past 240 hour(s)).  Radiology Reports Dg Chest Port 1 View  Result Date: 07/08/2018 CLINICAL DATA:  Shortness of breath EXAM: PORTABLE CHEST 1 VIEW COMPARISON:  March 31, 2018 FINDINGS: Heart is mildly enlarged with mild pulmonary venous hypertension. No edema or consolidation. No adenopathy. No bone lesions. IMPRESSION: Pulmonary vascular congestion without edema or consolidation. Electronically Signed   By: Lowella Grip III M.D.   On: 07/08/2018 13:15    Lab Data:  CBC: Recent Labs  Lab 07/08/18 1245 07/08/18 2344 07/09/18 0745  WBC 7.1 9.2 9.4  HGB 5.5* 6.4* 7.8*  HCT 19.2* 21.5* 25.3*  MCV 92.3 88.5 88.8  PLT 270 247 182   Basic Metabolic Panel: Recent Labs  Lab 07/08/18 1245 07/09/18 0745  NA 135 136  K 3.5 4.0  CL 99 100  CO2 25 26  GLUCOSE 183* 136*  BUN 30* 20  CREATININE 0.83 0.91  CALCIUM 9.2 9.3   GFR: Estimated  Creatinine Clearance: 75.8 mL/min (by C-G formula based on SCr of 0.91 mg/dL). Liver Function Tests: Recent Labs  Lab 07/08/18 1245  AST 23  ALT 18  ALKPHOS 55  BILITOT 0.2*  PROT 6.1*  ALBUMIN 3.6   Recent Labs  Lab 07/08/18 1245  LIPASE 26   No results for input(s): AMMONIA in the last 168 hours. Coagulation Profile: Recent Labs  Lab 07/08/18 1245 07/09/18 0745  INR 2.4* 1.9*   Cardiac Enzymes: Recent Labs  Lab 07/08/18 1245 07/08/18 2344 07/09/18 0745  TROPONINI 0.20* 60.09* 42.84*   BNP (last 3 results) No results for input(s): PROBNP in the last 8760 hours. HbA1C:  No results for input(s): HGBA1C in the last 72 hours. CBG: No results for input(s): GLUCAP in the last 168 hours. Lipid Profile: No results for input(s): CHOL, HDL, LDLCALC, TRIG, CHOLHDL, LDLDIRECT in the last 72 hours. Thyroid Function Tests: No results for input(s): TSH, T4TOTAL, FREET4, T3FREE, THYROIDAB in the last 72 hours. Anemia Panel: No results for input(s): VITAMINB12, FOLATE, FERRITIN, TIBC, IRON, RETICCTPCT in the last 72 hours. Urine analysis:    Component Value Date/Time   COLORURINE YELLOW 11/06/2017 0936   APPEARANCEUR HAZY (A) 11/06/2017 0936   LABSPEC 1.010 11/06/2017 0936   PHURINE 6.0 11/06/2017 0936   GLUCOSEU NEGATIVE 11/06/2017 0936   HGBUR SMALL (A) 11/06/2017 0936   BILIRUBINUR NEGATIVE 11/06/2017 0936   KETONESUR NEGATIVE 11/06/2017 0936   PROTEINUR NEGATIVE 11/06/2017 0936   NITRITE POSITIVE (A) 11/06/2017 0936   LEUKOCYTESUR LARGE (A) 11/06/2017 0936     Shadee Montoya M.D. [facility] 07/09/2018, 9:07 AM  Pager: [phone] Between 7am to 7pm - call Pager - [phone]  After 7pm go to [email or web address] - password [email or web address]  Call night coverage person covering after 7pm

## 2018-07-09 NOTE — Anesthesia Procedure Notes (Signed)
Procedure Name: Intubation Date/Time: 07/09/2018 1:49 PM Performed by: Alain Marion, CRNA Pre-anesthesia Checklist: Patient identified, Emergency Drugs available, Suction available and Patient being monitored Patient Re-evaluated:Patient Re-evaluated prior to induction Oxygen Delivery Method: Circle System Utilized Preoxygenation: Pre-oxygenation with 100% oxygen Induction Type: IV induction and Rapid sequence Ventilation: Mask ventilation without difficulty Laryngoscope Size: Miller and 2 Grade View: Grade I Tube type: Oral Tube size: 7.5 mm Number of attempts: 1 Airway Equipment and Method: Stylet and Oral airway Placement Confirmation: ETT inserted through vocal cords under direct vision,  positive ETCO2 and breath sounds checked- equal and bilateral Secured at: 21 cm Tube secured with: Tape Dental Injury: Teeth and Oropharynx as per pre-operative assessment

## 2018-07-09 NOTE — Interval H&P Note (Signed)
History and Physical Interval Note:  07/09/2018 1:42 PM  Michael Stephens  has presented today for surgery, with the diagnosis of GI Bleed.  The various methods of treatment have been discussed with the patient and family. After consideration of risks, benefits and other options for treatment, the patient has consented to  Procedure(s): ESOPHAGOGASTRODUODENOSCOPY (EGD) WITH PROPOFOL (N/A) as a surgical intervention.  The patient's history has been reviewed, patient examined, no change in status, stable for surgery.  I have reviewed the patient's chart and labs.  Questions were answered to the patient's satisfaction.     Lear Ng

## 2018-07-09 NOTE — Progress Notes (Addendum)
The patient has been seen in conjunction with Reino Bellis, NP. All aspects of care have been considered and discussed. The patient has been personally interviewed, examined, and all clinical data has been reviewed.   Marked enzyme release consistent with non-ST elevation MI type I, diffuse ischemic EKG changes suggesting left main disease or at least large circumflex occlusion that covers the apex.  In addition, severe anemia with hemoglobin less than 6 was present on admission, echo demonstrates LVEF of 40% with inferolateral wall motion abnormality (which is new when compared to prior study from December and personally reviewed) and patient on therapeutic Coumadin anticoagulation for prior history of A. Flutter.  Overall, high risk CAD/ischemic clinical scenario.  There is a large region of myocardium at risk/already damaged which will increase the risk of cardiac complications both in hospital and within the next 6 months.  Any cardiac intervention that requires anticoagulation will need to be driven by clinical scenario - refractory angina, acute STEMI, cardiogenic shock, and acute heart failure.  In absence of such, best approach will be conservative management including reversal of Coumadin anticoagulation, intensify anti-ischemic therapy as tolerated by blood pressure (nitrates and beta-blockers), transfuse to hemoglobin greater than 9, add high intensity statin therapy for plaque stabilization, and treat pulmonary congestion if develops with diuretic therapy.  Source of presumed GI bleeding is important to understand.  He is n.p.o. for upper endoscopy later today.  Hold antiplatelet therapy and IV heparin for now.  Goals of care should be discussed with the patient and family.  Diagnostic coronary angiography needs to be done sooner rather than later to determine underlying substrate.  INR 2.0 this morning.  Urgently over the weekend if becomes unstable, otherwise plan for Monday.   Orders will need to be written.  Progress Note  Patient Name: Michael Stephens Date of Encounter: 07/09/2018  Primary Cardiologist: Carlyle Dolly, MD   Subjective   No chest pain overnight.  Says that all of his pain was in the epigastric area.  Complains of being  hungry.  Currently n.p.o. for upper GI endoscopy.  Still short of breath and states he has been this way for couple years.  Inpatient Medications    Scheduled Meds: . finasteride  5 mg Oral Daily  . mometasone-formoterol  2 puff Inhalation BID  . pantoprazole (PROTONIX) IV  40 mg Intravenous Q12H  . sodium chloride flush  3 mL Intravenous Q12H  . tamsulosin  0.4 mg Oral Daily   Continuous Infusions: . sodium chloride     PRN Meds: sodium chloride, acetaminophen **OR** acetaminophen, nitroGLYCERIN, ondansetron **OR** ondansetron (ZOFRAN) IV, sodium chloride flush   Vital Signs    Vitals:   07/09/18 0201 07/09/18 0237 07/09/18 0530 07/09/18 0753  BP:  123/75 123/69 119/75  Pulse: 80 88 87 75  Resp: 19 20 (!) 23   Temp: 98.1 F (36.7 C) 98.3 F (36.8 C) 98.1 F (36.7 C) 98.9 F (37.2 C)  TempSrc: Oral Oral Oral Oral  SpO2: 99% 100% 93% 94%  Weight:      Height:        Intake/Output Summary (Last 24 hours) at 07/09/2018 0759 Last data filed at 07/09/2018 0536 Gross per 24 hour  Intake 1020 ml  Output 675 ml  Net 345 ml   Last 3 Weights 07/08/2018 07/08/2018 06/24/2018  Weight (lbs) 215 lb 202 lb 212 lb 9.6 oz  Weight (kg) 97.523 kg 91.627 kg 96.435 kg      Telemetry  SR - Personally Reviewed  ECG    3/26 SR with global depression, ST change in aVF - Personally Reviewed  Physical Exam   GEN: No acute distress.   Neck: No JVD Cardiac: RRR, no murmurs, rubs, or gallops.  Respiratory: Clear to auscultation bilaterally. GI: Soft, nontender, non-distended  MS: No edema; No deformity. Neuro:  Nonfocal  Psych: Normal affect   Labs    Chemistry Recent Labs  Lab 07/08/18 1245  NA 135  K 3.5   CL 99  CO2 25  GLUCOSE 183*  BUN 30*  CREATININE 0.83  CALCIUM 9.2  PROT 6.1*  ALBUMIN 3.6  AST 23  ALT 18  ALKPHOS 55  BILITOT 0.2*  GFRNONAA >60  GFRAA >60  ANIONGAP 11     Hematology Recent Labs  Lab 07/08/18 1245 07/08/18 2344  WBC 7.1 9.2  RBC 2.08* 2.43*  HGB 5.5* 6.4*  HCT 19.2* 21.5*  MCV 92.3 88.5  MCH 26.4 26.3  MCHC 28.6* 29.8*  RDW 16.8* 16.1*  PLT 270 247    Cardiac Enzymes Recent Labs  Lab 07/08/18 1245 07/08/18 2344  TROPONINI 0.20* 60.09*    Recent Labs  Lab 07/08/18 1252  TROPIPOC 0.15*     BNPNo results for input(s): BNP, PROBNP in the last 168 hours.   DDimer No results for input(s): DDIMER in the last 168 hours.   Radiology    Dg Chest Port 1 View  Result Date: 07/08/2018 CLINICAL DATA:  Shortness of breath EXAM: PORTABLE CHEST 1 VIEW COMPARISON:  March 31, 2018 FINDINGS: Heart is mildly enlarged with mild pulmonary venous hypertension. No edema or consolidation. No adenopathy. No bone lesions. IMPRESSION: Pulmonary vascular congestion without edema or consolidation. Electronically Signed   By: Lowella Grip III M.D.   On: 07/08/2018 13:15    Cardiac Studies   2D Doppler echocardiogram 07/09/2018:   IMPRESSIONS   1. Severe hypokinesis of the left ventricular, entire inferolateral wall.  2. The left ventricle has mild-moderately reduced systolic function, with an ejection fraction of 40-45%. The cavity size was normal. There is mild asymmetric left ventricular hypertrophy. Left ventricular diastolic Doppler parameters are consistent  with impaired relaxation.  3. The right ventricle has normal systolc function. The cavity was normal. There is no increase in right ventricular wall thickness. Right ventricular systolic pressure is moderately elevated with an estimated pressure of 63.7 mmHg.  4. Left atrial size was moderately dilated.  5. Right atrial size was moderately dilated.  6. The mitral valve is myxomatous. Mild  thickening of the mitral valve leaflet.  7. The aortic valve is tricuspid Mild thickening of the aortic valve Mild calcification of the aortic valve. Aortic valve regurgitation is trivial by color flow Doppler.  8. There is mild dilatation of the aortic root measuring 40 mm.  9. The inferior vena cava was dilated in size with >50% respiratory variability. 10. No intracardiac thrombi or masses were visualized.  Patient Profile     84 y.o. male with PMH of atrial arrhythmias with Afib/aflutter/MAT, chronic diastolic HF, COPD who presented with chest pain and found to have Hgb 5.5.   Assessment & Plan    1. Abd pain/chest pain: symptoms seemed more suggestive of GI etiology on admission, but now seem to be suggestive of CAD. Did have a prior stress test back in 2018 which was intermediate risk but did not want to pursue cath at that time. EKG on admission concerning for multivessel CAD, possibly LM. No abd pain  overnight.   2. NSTEMI: Trop 0.20>>60.09. No chest pain overnight. Repeat EKG this morning. Overall picture is concerning for ACS but in the setting of severe anemia not a candidate for invasive procedure at this time. Unable to treat with IV heparin given GI bleed.  -- check echo for LVEF and WMA  3. Severe Anemia: reported dark stools, be felt were related to pepto bismol use. Baseline Hgb reported around 9. Was 5.5 on admission and received 1 units PRBC. Hgb up to 6.4>>7.8 this morning. GI has been consulted per primary with plans to see today. No abd pain overnight.  4. Afib: Has been on coumadin historically prior to admission. INR 2.4 on admission. Coumadin has been held appropriately. Currently in SR.  -- INR 1.9 this morning   For questions or updates, please contact [place] Please consult [email or web address] for contact info under    Signed, Reino Bellis, NP  07/09/2018, 7:59 AM

## 2018-07-09 NOTE — Progress Notes (Signed)
CRITICAL VALUE ALERT Hemoglobin Critical Value:  6.4  Date & Time Notied: 3/27/202  0120  Provider Notified: X. Blount  Orders Received/Actions taken: 1 unit of RBC ordered.

## 2018-07-09 NOTE — Consult Note (Signed)
Referring Provider: Dr. Tana Coast Primary Care Physician:  [facility] Primary Gastroenterologist:  Althia Forts  Reason for Consultation:  GI bleed  HPI: Michael Stephens is a 84 y.o. male with multiple medical problems seen at [facility] yesterday for chest pain and transferred to [facility] for cardiology evaluation for NSTEMI. On chronic Coumadin for atrial flutter and INR 2.4 yesterday and 1.9 today. Hgb 5.5 on admit yesterday. Heme positive. Has been having black stools for weeks that he attributes to long-term use of Pepto-Bismol. Has been having belching and nausea with epigastric pain this week but later in my discussion he denies any abdominal pain. Denies vomiting, hematemesis, hematochezia. Felt dizzy this week prior to going to the outside hospital. Denies NSAIDs. Denies alcohol. No history of ulcers. History of adenomatous polyps and internal hemorrhoids on colonoscopy in 2018 by Dr. Laural Golden. Denies ever having an EGD.  Past Medical History:  Diagnosis Date  . Acute diastolic CHF (congestive heart failure) ([place]) 02/11/2017  . Atrial flutter ([place])    a. diagnosed in 02/2017. Rate-control strategy pursued.   Marland Kitchen BPH (benign prostatic hyperplasia)   . Chronic diastolic heart failure ([place])   . Hypertension   . Renal disorder    cyst on kidney    Past Surgical History:  Procedure Laterality Date  . ABCESS DRAINAGE  11/2008   BUTTOCKS  . CATARACT EXTRACTION, BILATERAL    . COLONOSCOPY N/A 04/02/2017   Procedure: COLONOSCOPY;  Surgeon: Rogene Houston, MD;  [facility];  Service: Endoscopy;  Laterality: N/A;  10:55  . POLYPECTOMY  04/02/2017   Procedure: POLYPECTOMY;  Surgeon: Rogene Houston, MD;  Location[facility];  Service: Endoscopy;;  asceding colon(hot snare)/ sigmoid colon times two (cold snare)    Prior to Admission medications   Medication Sig Start Date End Date Taking? Authorizing Provider  budesonide-formoterol (SYMBICORT) 160-4.5 MCG/ACT  inhaler Inhale 2 puffs into the lungs 2 (two) times daily.   Yes [provider]  famotidine (PEPCID) 40 MG tablet Take 1 tablet (40 mg total) by mouth daily. 03/29/18 06/24/19 Yes Long, Wonda Olds, MD  finasteride (PROSCAR) 5 MG tablet Take 5 mg by mouth daily.   Yes [provider]  furosemide (LASIX) 80 MG tablet Take 1 tablet (80 mg total) by mouth daily. 06/24/18  Yes Branch, Alphonse Guild, MD  losartan (COZAAR) 25 MG tablet Take 1 tablet (25 mg total) by mouth daily. 09/09/17 06/24/19 Yes Johnson, Clanford L, MD  metoprolol tartrate (LOPRESSOR) 25 MG tablet Take 1 tablet (25 mg total) by mouth 2 (two) times daily. 04/04/18  Yes Tat, Shanon Brow, MD  potassium chloride (K-DUR,KLOR-CON) 20 MEQ tablet Take 1 tablet (20 mEq total) by mouth daily. 09/09/17 06/24/19 Yes Johnson, Clanford L, MD  tamsulosin (FLOMAX) 0.4 MG CAPS capsule Take 0.4 mg by mouth daily.    Yes [provider]  warfarin (COUMADIN) 5 MG tablet Take 1 tablet daily except 1/2 tablet on Mondays and Fridays or as directed Patient taking differently: Take 2.5-5 mg by mouth See admin instructions. Take 5 mg daily except for 2.5 mg  on Mondays and Fridays 03/08/18  Yes Branch, Alphonse Guild, MD  vitamin B-12 (CYANOCOBALAMIN) 500 MCG tablet Take 1 tablet (500 mcg total) by mouth daily. Patient not taking: Reported on 07/08/2018 04/05/18   Orson Eva, MD    Scheduled Meds: . finasteride  5 mg Oral Daily  . mometasone-formoterol  2 puff Inhalation BID  . pantoprazole (PROTONIX) IV  40 mg  Intravenous Q12H  . sodium chloride flush  3 mL Intravenous Q12H  . tamsulosin  0.4 mg Oral Daily   Continuous Infusions: . sodium chloride     PRN Meds:.sodium chloride, acetaminophen **OR** acetaminophen, nitroGLYCERIN, ondansetron **OR** ondansetron (ZOFRAN) IV, sodium chloride flush  Allergies as of 07/08/2018 - Review Complete 07/08/2018  Allergen Reaction Noted  . Penicillins  02/09/2017    Family History  Problem Relation Age  of Onset  . CVA Father   . CAD Father   . Diabetes Sister   . Colon cancer Neg Hx     Social History   Socioeconomic History  . Marital status: Married    Spouse name: Not on file  . Number of children: Not on file  . Years of education: Not on file  . Highest education level: Not on file  Occupational History  . Not on file  Social Needs  . Financial resource strain: Not on file  . Food insecurity:    Worry: Not on file    Inability: Not on file  . Transportation needs:    Medical: Not on file    Non-medical: Not on file  Tobacco Use  . Smoking status: Former Smoker    Types: Cigarettes    Last attempt to quit: 2006    Years since quitting: 14.2  . Smokeless tobacco: Never Used  Substance and Sexual Activity  . Alcohol use: No  . Drug use: No  . Sexual activity: Never  Lifestyle  . Physical activity:    Days per week: Not on file    Minutes per session: Not on file  . Stress: Not on file  Relationships  . Social connections:    Talks on phone: Not on file    Gets together: Not on file    Attends religious service: Not on file    Active member of club or organization: Not on file    Attends meetings of clubs or organizations: Not on file    Relationship status: Not on file  . Intimate partner violence:    Fear of current or ex partner: Not on file    Emotionally abused: Not on file    Physically abused: Not on file    Forced sexual activity: Not on file  Other Topics Concern  . Not on file  Social History Narrative  . Not on file    Review of Systems: All negative except as stated above in HPI.  Physical Exam: Vital signs: Vitals:   07/09/18 0753 07/09/18 0821  BP: 119/75   Pulse: 75 83  Resp:    Temp: 98.9 F (37.2 C)   SpO2: 94% 94%  R 20  Last BM Date: 07/08/18 General:   Elderly, alert, Well-developed, well-nourished, pleasant and cooperative in NAD Head: normocephalic, atraumatic Eyes: anicteric sclera ENT: oropharynx clear Neck:  supple, nontender Lungs:  Clear throughout to auscultation.   No wheezes, crackles, or rhonchi. No acute distress. Heart:  Regular rate and rhythm; no murmurs, clicks, rubs,  or gallops. Abdomen: soft, nontender, nondistended, +BS  Rectal:  Deferred Ext: no edema  GI:  Lab Results: Recent Labs    07/08/18 1245 07/08/18 2344 07/09/18 0745  WBC 7.1 9.2 9.4  HGB 5.5* 6.4* 7.8*  HCT 19.2* 21.5* 25.3*  PLT 270 247 263   BMET Recent Labs    07/08/18 1245 07/09/18 0745  NA 135 136  K 3.5 4.0  CL 99 100  CO2 25 26  GLUCOSE 183* 136*  BUN 30* 20  CREATININE 0.83 0.91  CALCIUM 9.2 9.3   LFT Recent Labs    07/08/18 1245  PROT 6.1*  ALBUMIN 3.6  AST 23  ALT 18  ALKPHOS 55  BILITOT 0.2*   PT/INR Recent Labs    07/08/18 1245 07/09/18 0745  LABPROT 25.6* 21.7*  INR 2.4* 1.9*     Studies/Results: Dg Chest Port 1 View  Result Date: 07/08/2018 CLINICAL DATA:  Shortness of breath EXAM: PORTABLE CHEST 1 VIEW COMPARISON:  March 31, 2018 FINDINGS: Heart is mildly enlarged with mild pulmonary venous hypertension. No edema or consolidation. No adenopathy. No bone lesions. IMPRESSION: Pulmonary vascular congestion without edema or consolidation. Electronically Signed   By: Lowella Grip III M.D.   On: 07/08/2018 13:15    Impression/Plan: Severe anemia with heme positive stool and question of melena concerning for a peptic ulcer source. AVMs also in differential. Chronically on Pepto-Bismol but severe anemia needs to be evaluated further prior to resuming anticoagulation. EGD planned for today. If EGD unrevealing, then may need to consider an updated colonoscopy but likely would be low yield since he had one 2 years ago. NPO for EGD today. Continue PPI IV Q 12 hours.    LOS: 1 day   Lear Ng  07/09/2018, 10:18 AM  Questions please call [phone]

## 2018-07-09 NOTE — Progress Notes (Signed)
CRITICAL VALUE ALERT Troponin  Critical Value:  60.09  Date & Time Notied: 07/09/18  0125  Provider Notified: Chakravartti   Orders Received/Actions taken:  No orders received, will continue to monitor patient closely.

## 2018-07-09 NOTE — Progress Notes (Signed)
  Echocardiogram 2D Echocardiogram has been performed.  A limited echo was preformed to reduce patient to technician contact.  Madelaine Etienne 07/09/2018, 9:20 AM

## 2018-07-09 NOTE — Progress Notes (Signed)
  Echocardiogram 2D Echocardiogram has been performed.   A limited echo was performed to reduce patient to technician contact.  Michael Stephens 07/09/2018, 9:18 AM

## 2018-07-09 NOTE — H&P (View-Only) (Signed)
Referring Provider: Dr. Tana Coast Primary Care Physician:  [facility] Primary Gastroenterologist:  Althia Forts  Reason for Consultation:  GI bleed  HPI: Michael Stephens is a 84 y.o. male with multiple medical problems seen at [facility] yesterday for chest pain and transferred to [facility] for cardiology evaluation for NSTEMI. On chronic Coumadin for atrial flutter and INR 2.4 yesterday and 1.9 today. Hgb 5.5 on admit yesterday. Heme positive. Has been having black stools for weeks that he attributes to long-term use of Pepto-Bismol. Has been having belching and nausea with epigastric pain this week but later in my discussion he denies any abdominal pain. Denies vomiting, hematemesis, hematochezia. Felt dizzy this week prior to going to the outside hospital. Denies NSAIDs. Denies alcohol. No history of ulcers. History of adenomatous polyps and internal hemorrhoids on colonoscopy in 2018 by Dr. Laural Golden. Denies ever having an EGD.  Past Medical History:  Diagnosis Date  . Acute diastolic CHF (congestive heart failure) ([place]) 02/11/2017  . Atrial flutter ([place])    a. diagnosed in 02/2017. Rate-control strategy pursued.   Marland Kitchen BPH (benign prostatic hyperplasia)   . Chronic diastolic heart failure ([place])   . Hypertension   . Renal disorder    cyst on kidney    Past Surgical History:  Procedure Laterality Date  . ABCESS DRAINAGE  11/2008   BUTTOCKS  . CATARACT EXTRACTION, BILATERAL    . COLONOSCOPY N/A 04/02/2017   Procedure: COLONOSCOPY;  Surgeon: Rogene Houston, MD;  [facility];  Service: Endoscopy;  Laterality: N/A;  10:55  . POLYPECTOMY  04/02/2017   Procedure: POLYPECTOMY;  Surgeon: Rogene Houston, MD;  [facility];  Service: Endoscopy;;  asceding colon(hot snare)/ sigmoid colon times two (cold snare)    Prior to Admission medications   Medication Sig Start Date End Date Taking? Authorizing Provider  budesonide-formoterol (SYMBICORT) 160-4.5 MCG/ACT  inhaler Inhale 2 puffs into the lungs 2 (two) times daily.   Yes [provider]  famotidine (PEPCID) 40 MG tablet Take 1 tablet (40 mg total) by mouth daily. 03/29/18 06/24/19 Yes Long, Wonda Olds, MD  finasteride (PROSCAR) 5 MG tablet Take 5 mg by mouth daily.   Yes [provider]  furosemide (LASIX) 80 MG tablet Take 1 tablet (80 mg total) by mouth daily. 06/24/18  Yes Branch, Alphonse Guild, MD  losartan (COZAAR) 25 MG tablet Take 1 tablet (25 mg total) by mouth daily. 09/09/17 06/24/19 Yes Johnson, Clanford L, MD  metoprolol tartrate (LOPRESSOR) 25 MG tablet Take 1 tablet (25 mg total) by mouth 2 (two) times daily. 04/04/18  Yes Tat, Shanon Brow, MD  potassium chloride (K-DUR,KLOR-CON) 20 MEQ tablet Take 1 tablet (20 mEq total) by mouth daily. 09/09/17 06/24/19 Yes Johnson, Clanford L, MD  tamsulosin (FLOMAX) 0.4 MG CAPS capsule Take 0.4 mg by mouth daily.    Yes [provider]  warfarin (COUMADIN) 5 MG tablet Take 1 tablet daily except 1/2 tablet on Mondays and Fridays or as directed Patient taking differently: Take 2.5-5 mg by mouth See admin instructions. Take 5 mg daily except for 2.5 mg  on Mondays and Fridays 03/08/18  Yes Branch, Alphonse Guild, MD  vitamin B-12 (CYANOCOBALAMIN) 500 MCG tablet Take 1 tablet (500 mcg total) by mouth daily. Patient not taking: Reported on 07/08/2018 04/05/18   Orson Eva, MD    Scheduled Meds: . finasteride  5 mg Oral Daily  . mometasone-formoterol  2 puff Inhalation BID  . pantoprazole (PROTONIX) IV  40 mg  Intravenous Q12H  . sodium chloride flush  3 mL Intravenous Q12H  . tamsulosin  0.4 mg Oral Daily   Continuous Infusions: . sodium chloride     PRN Meds:.sodium chloride, acetaminophen **OR** acetaminophen, nitroGLYCERIN, ondansetron **OR** ondansetron (ZOFRAN) IV, sodium chloride flush  Allergies as of 07/08/2018 - Review Complete 07/08/2018  Allergen Reaction Noted  . Penicillins  02/09/2017    Family History  Problem Relation Age  of Onset  . CVA Father   . CAD Father   . Diabetes Sister   . Colon cancer Neg Hx     Social History   Socioeconomic History  . Marital status: Married    Spouse name: Not on file  . Number of children: Not on file  . Years of education: Not on file  . Highest education level: Not on file  Occupational History  . Not on file  Social Needs  . Financial resource strain: Not on file  . Food insecurity:    Worry: Not on file    Inability: Not on file  . Transportation needs:    Medical: Not on file    Non-medical: Not on file  Tobacco Use  . Smoking status: Former Smoker    Types: Cigarettes    Last attempt to quit: 2006    Years since quitting: 14.2  . Smokeless tobacco: Never Used  Substance and Sexual Activity  . Alcohol use: No  . Drug use: No  . Sexual activity: Never  Lifestyle  . Physical activity:    Days per week: Not on file    Minutes per session: Not on file  . Stress: Not on file  Relationships  . Social connections:    Talks on phone: Not on file    Gets together: Not on file    Attends religious service: Not on file    Active member of club or organization: Not on file    Attends meetings of clubs or organizations: Not on file    Relationship status: Not on file  . Intimate partner violence:    Fear of current or ex partner: Not on file    Emotionally abused: Not on file    Physically abused: Not on file    Forced sexual activity: Not on file  Other Topics Concern  . Not on file  Social History Narrative  . Not on file    Review of Systems: All negative except as stated above in HPI.  Physical Exam: Vital signs: Vitals:   07/09/18 0753 07/09/18 0821  BP: 119/75   Pulse: 75 83  Resp:    Temp: 98.9 F (37.2 C)   SpO2: 94% 94%  R 20  Last BM Date: 07/08/18 General:   Elderly, alert, Well-developed, well-nourished, pleasant and cooperative in NAD Head: normocephalic, atraumatic Eyes: anicteric sclera ENT: oropharynx clear Neck:  supple, nontender Lungs:  Clear throughout to auscultation.   No wheezes, crackles, or rhonchi. No acute distress. Heart:  Regular rate and rhythm; no murmurs, clicks, rubs,  or gallops. Abdomen: soft, nontender, nondistended, +BS  Rectal:  Deferred Ext: no edema  GI:  Lab Results: Recent Labs    07/08/18 1245 07/08/18 2344 07/09/18 0745  WBC 7.1 9.2 9.4  HGB 5.5* 6.4* 7.8*  HCT 19.2* 21.5* 25.3*  PLT 270 247 263   BMET Recent Labs    07/08/18 1245 07/09/18 0745  NA 135 136  K 3.5 4.0  CL 99 100  CO2 25 26  GLUCOSE 183* 136*  BUN 30* 20  CREATININE 0.83 0.91  CALCIUM 9.2 9.3   LFT Recent Labs    07/08/18 1245  PROT 6.1*  ALBUMIN 3.6  AST 23  ALT 18  ALKPHOS 55  BILITOT 0.2*   PT/INR Recent Labs    07/08/18 1245 07/09/18 0745  LABPROT 25.6* 21.7*  INR 2.4* 1.9*     Studies/Results: Dg Chest Port 1 View  Result Date: 07/08/2018 CLINICAL DATA:  Shortness of breath EXAM: PORTABLE CHEST 1 VIEW COMPARISON:  March 31, 2018 FINDINGS: Heart is mildly enlarged with mild pulmonary venous hypertension. No edema or consolidation. No adenopathy. No bone lesions. IMPRESSION: Pulmonary vascular congestion without edema or consolidation. Electronically Signed   By: Lowella Grip III M.D.   On: 07/08/2018 13:15    Impression/Plan: Severe anemia with heme positive stool and question of melena concerning for a peptic ulcer source. AVMs also in differential. Chronically on Pepto-Bismol but severe anemia needs to be evaluated further prior to resuming anticoagulation. EGD planned for today. If EGD unrevealing, then may need to consider an updated colonoscopy but likely would be low yield since he had one 2 years ago. NPO for EGD today. Continue PPI IV Q 12 hours.    LOS: 1 day   Lear Ng  07/09/2018, 10:18 AM  Questions please call [phone]

## 2018-07-09 NOTE — Brief Op Note (Addendum)
Mild duodenitis, minimal gastritis, and mild distal esophagitis. No bleeding seen and no ulcers seen on EGD. See endopro note for details. Follow H/Hs and if anemia persists or worsens, then may need an updated colonoscopy. Continue to hold anticoagulation for now. Soft diet. Dr. Watt Climes to f/u from GI tomorrow.

## 2018-07-10 ENCOUNTER — Inpatient Hospital Stay (HOSPITAL_COMMUNITY): Payer: Medicare PPO

## 2018-07-10 ENCOUNTER — Other Ambulatory Visit: Payer: Self-pay

## 2018-07-10 ENCOUNTER — Encounter (HOSPITAL_COMMUNITY): Payer: Self-pay | Admitting: Radiology

## 2018-07-10 DIAGNOSIS — I10 Essential (primary) hypertension: Secondary | ICD-10-CM

## 2018-07-10 DIAGNOSIS — I4891 Unspecified atrial fibrillation: Secondary | ICD-10-CM

## 2018-07-10 DIAGNOSIS — I472 Ventricular tachycardia: Secondary | ICD-10-CM

## 2018-07-10 DIAGNOSIS — I519 Heart disease, unspecified: Secondary | ICD-10-CM

## 2018-07-10 LAB — CBC
HCT: 21.5 % — ABNORMAL LOW (ref 39.0–52.0)
Hemoglobin: 6.6 g/dL — CL (ref 13.0–17.0)
MCH: 27.7 pg (ref 26.0–34.0)
MCHC: 30.7 g/dL (ref 30.0–36.0)
MCV: 90.3 fL (ref 80.0–100.0)
Platelets: 212 10*3/uL (ref 150–400)
RBC: 2.38 MIL/uL — ABNORMAL LOW (ref 4.22–5.81)
RDW: 16.3 % — ABNORMAL HIGH (ref 11.5–15.5)
WBC: 8.4 10*3/uL (ref 4.0–10.5)
nRBC: 0.2 % (ref 0.0–0.2)

## 2018-07-10 LAB — PREPARE RBC (CROSSMATCH)

## 2018-07-10 LAB — LIPID PANEL
Cholesterol: 135 mg/dL (ref 0–200)
HDL: 41 mg/dL (ref >40)
LDL Cholesterol: 85 mg/dL (ref 0–99)
Total CHOL/HDL Ratio: 3.3 RATIO
Triglycerides: 47 mg/dL (ref <150)
VLDL: 9 mg/dL (ref 0–40)

## 2018-07-10 LAB — BASIC METABOLIC PANEL
Anion gap: 6 (ref 5–15)
BUN: 19 mg/dL (ref 8–23)
CO2: 27 mmol/L (ref 22–32)
CREATININE: 0.94 mg/dL (ref 0.61–1.24)
Calcium: 8.8 mg/dL — ABNORMAL LOW (ref 8.9–10.3)
Chloride: 103 mmol/L (ref 98–111)
GFR calc Af Amer: >60 mL/min (ref >60)
GFR calc non Af Amer: >60 mL/min (ref >60)
Glucose, Bld: 120 mg/dL — ABNORMAL HIGH (ref 70–99)
Potassium: 4.2 mmol/L (ref 3.5–5.1)
Sodium: 136 mmol/L (ref 135–145)

## 2018-07-10 LAB — MAGNESIUM: Magnesium: 2.2 mg/dL (ref 1.7–2.4)

## 2018-07-10 MED ORDER — PANTOPRAZOLE SODIUM 40 MG PO TBEC
40.0000 mg | DELAYED_RELEASE_TABLET | Freq: Every day | ORAL | Status: DC
  Administered 2018-07-10 – 2018-07-14 (×5): 40 mg via ORAL
  Filled 2018-07-10 (×6): qty 1

## 2018-07-10 MED ORDER — METOPROLOL TARTRATE 12.5 MG HALF TABLET
12.5000 mg | ORAL_TABLET | Freq: Two times a day (BID) | ORAL | Status: DC
  Administered 2018-07-10 – 2018-07-14 (×9): 12.5 mg via ORAL
  Filled 2018-07-10 (×9): qty 1

## 2018-07-10 MED ORDER — ATORVASTATIN CALCIUM 80 MG PO TABS
80.0000 mg | ORAL_TABLET | Freq: Every day | ORAL | Status: DC
  Administered 2018-07-10 – 2018-07-13 (×3): 80 mg via ORAL
  Filled 2018-07-10 (×3): qty 1

## 2018-07-10 MED ORDER — SODIUM CHLORIDE 0.9% IV SOLUTION: Freq: Once | INTRAVENOUS | Status: DC

## 2018-07-10 MED ORDER — METOPROLOL TARTRATE 5 MG/5ML IV SOLN
INTRAVENOUS | Status: AC
  Filled 2018-07-10: qty 5

## 2018-07-10 MED ORDER — IOHEXOL 300 MG/ML  SOLN
100.0000 mL | Freq: Once | INTRAMUSCULAR | Status: AC | PRN
  Administered 2018-07-10: 100 mL via INTRAVENOUS

## 2018-07-10 MED ORDER — METOPROLOL TARTRATE 5 MG/5ML IV SOLN
5.0000 mg | INTRAVENOUS | Status: DC | PRN
  Administered 2018-07-10: 5 mg via INTRAVENOUS

## 2018-07-10 NOTE — Plan of Care (Signed)
  Problem: Clinical Measurements: Goal: Cardiovascular complication will be avoided Outcome: Progressing   

## 2018-07-10 NOTE — Progress Notes (Signed)
Progress Note  Patient Name: Michael Stephens Date of Encounter: 07/10/2018  Primary Cardiologist: Carlyle Dolly, MD   Subjective   Denies chest pain.  Currently receiving 1 unit of PRBC transfusion.  He is on 3 L of oxygen.  He has abdominal pressure.  Inpatient Medications    Scheduled Meds: . sodium chloride   Intravenous Once  . finasteride  5 mg Oral Daily  . metoprolol tartrate      . metoprolol tartrate  12.5 mg Oral BID  . mometasone-formoterol  2 puff Inhalation BID  . pantoprazole (PROTONIX) IV  40 mg Intravenous Q12H  . sodium chloride flush  3 mL Intravenous Q12H  . tamsulosin  0.4 mg Oral Daily   Continuous Infusions: . sodium chloride     PRN Meds: sodium chloride, acetaminophen **OR** acetaminophen, metoprolol tartrate, nitroGLYCERIN, ondansetron **OR** ondansetron (ZOFRAN) IV, sodium chloride flush   Vital Signs    Vitals:   07/10/18 0518 07/10/18 0539 07/10/18 0724 07/10/18 0742  BP: (!) 101/59 (!) 104/57 97/70 (!) 93/54  Pulse: 71  (!) 115 92  Resp: 19 18 (!) 21   Temp: 98.7 F (37.1 C) 98.2 F (36.8 C) 98.7 F (37.1 C)   TempSrc: Oral Oral Oral   SpO2: 94%  92% 93%  Weight:      Height:        Intake/Output Summary (Last 24 hours) at 07/10/2018 0825 Last data filed at 07/10/2018 0324 Gross per 24 hour  Intake 1220 ml  Output 250 ml  Net 970 ml   Filed Weights   07/08/18 1218 07/08/18 1925 07/10/18 0346  Weight: 91.6 kg 97.5 kg 95.8 kg    Telemetry    Rapid atrial fibrillation, multiple runs of NSVT, PVCs- Personally Reviewed  ECG    Rapid atrial fibrillation, ST precordial segment depression and ST elevation in lead III consistent with acute inferoposterior MI- Personally Reviewed  Physical Exam   GEN: No acute distress.   Neck: No JVD Cardiac:  Tachycardic, irregular, no murmurs, rubs, or gallops.  Respiratory:  Diminished breath sounds bilaterally with bibasilar crackles. GI: Soft, mild epigastric tenderness, non-distended   MS: No edema; No deformity. Neuro:  Nonfocal  Psych: Normal affect   Labs    Chemistry Recent Labs  Lab 07/08/18 1245 07/09/18 0745 07/10/18 0339  NA 135 136 136  K 3.5 4.0 4.2  CL 99 100 103  CO2 25 26 27   GLUCOSE 183* 136* 120*  BUN 30* 20 19  CREATININE 0.83 0.91 0.94  CALCIUM 9.2 9.3 8.8*  PROT 6.1*  --   --   ALBUMIN 3.6  --   --   AST 23  --   --   ALT 18  --   --   ALKPHOS 55  --   --   BILITOT 0.2*  --   --   GFRNONAA >60 >60 >60  GFRAA >60 >60 >60  ANIONGAP 11 10 6      Hematology Recent Labs  Lab 07/08/18 2344 07/09/18 0745 07/10/18 0339  WBC 9.2 9.4 8.4  RBC 2.43* 2.85* 2.38*  HGB 6.4* 7.8* 6.6*  HCT 21.5* 25.3* 21.5*  MCV 88.5 88.8 90.3  MCH 26.3 27.4 27.7  MCHC 29.8* 30.8 30.7  RDW 16.1* 16.2* 16.3*  PLT 247 263 212    Cardiac Enzymes Recent Labs  Lab 07/08/18 1245 07/08/18 2344 07/09/18 0745 07/09/18 1143  TROPONINI 0.20* 60.09* 42.84* 25.48*    Recent Labs  Lab 07/08/18 1252  [place]  0.15*     BNP Recent Labs  Lab 07/09/18 1255  BNP 769.3*     DDimer No results for input(s): DDIMER in the last 168 hours.   Radiology    Dg Chest Port 1 View  Result Date: 07/08/2018 CLINICAL DATA:  Shortness of breath EXAM: PORTABLE CHEST 1 VIEW COMPARISON:  March 31, 2018 FINDINGS: Heart is mildly enlarged with mild pulmonary venous hypertension. No edema or consolidation. No adenopathy. No bone lesions. IMPRESSION: Pulmonary vascular congestion without edema or consolidation. Electronically Signed   By: Lowella Grip III M.D.   On: 07/08/2018 13:15    Cardiac Studies   2D Doppler echocardiogram 07/09/2018:   IMPRESSIONS  1. Severe hypokinesis of the left ventricular, entire inferolateral wall. 2. The left ventricle has mild-moderately reduced systolic function, with an ejection fraction of 40-45%. The cavity size was normal. There is mild asymmetric left ventricular hypertrophy. Left ventricular diastolic Doppler  parameters are consistent  with impaired relaxation. 3. The right ventricle has normal systolc function. The cavity was normal. There is no increase in right ventricular wall thickness. Right ventricular systolic pressure is moderately elevated with an estimated pressure of 63.7 mmHg. 4. Left atrial size was moderately dilated. 5. Right atrial size was moderately dilated. 6. The mitral valve is myxomatous. Mild thickening of the mitral valve leaflet. 7. The aortic valve is tricuspid Mild thickening of the aortic valve Mild calcification of the aortic valve. Aortic valve regurgitation is trivial by color flow Doppler. 8. There is mild dilatation of the aortic root measuring 40 mm. 9. The inferior vena cava was dilated in size with >50% respiratory variability. 10. No intracardiac thrombi or masses were visualized.  Patient Profile     84 y.o. male with PMH of atrial arrhythmias with Afib/aflutter/MAT, chronic diastolic HF, COPD who presented with chest pain and found to have Hgb 5.5.   Assessment & Plan    1. NSTEMI: Troponins trending down to 25.48 yesterday.  Not a candidate for antiplatelet/anticoagulation therapy at the present time given severe anemia.  LVEF 40 to 45%.  Conservative management for now.  Currently on metoprolol 12.5 mg twice daily.  Runs of NSVT.  I will check a magnesium level.  I will add atorvastatin 80 mg as recommended yesterday by Dr. Tamala Julian.  We recommend transfusing to hemoglobin greater than 9 as also recommended by Dr. Tamala Julian (currently receiving 1 unit PRBC transfusion).  Please refer to discussion in Dr. Thompson Caul note on 07/09/2018.  2.  Rapid atrial fibrillation: Currently not a candidate for anticoagulation given severe anemia/GI bleed.  Remains on metoprolol 12.5 mg twice daily.  3.  Severe anemia: GI following.  EGD yesterday which showed grade B reflux esophagitis and acute gastritis.  He will need a colonoscopy.  He is currently receiving 1 unit PRBC  transfusion.  4.  NSVT: Currently on metoprolol 12.5 mg twice daily.  Due to fluctuating blood pressures I am unable to advance this dose.  Potassium 4.2.  I will check a magnesium level.  5.  Hyperlipidemia: LDL 85.  I will start atorvastatin 80 mg.  6.  Left ventricular dysfunction: No significant volume overload.  For questions or updates, please contact [place] Please consult [email or web address] for contact info under Cardiology/STEMI.      Signed, Kate Sable, MD  07/10/2018, 8:25 AM

## 2018-07-10 NOTE — Anesthesia Postprocedure Evaluation (Signed)
Anesthesia Post Note  Patient: Michael Stephens  Procedure(s) Performed: ESOPHAGOGASTRODUODENOSCOPY (EGD) WITH PROPOFOL (N/A )     Patient location during evaluation: Endoscopy Anesthesia Type: General Level of consciousness: awake and alert Pain management: pain level controlled Vital Signs Assessment: post-procedure vital signs reviewed and stable Respiratory status: spontaneous breathing, nonlabored ventilation, respiratory function stable and patient connected to nasal cannula oxygen Cardiovascular status: blood pressure returned to baseline and stable Postop Assessment: no apparent nausea or vomiting Anesthetic complications: no    Last Vitals:  Vitals:   07/10/18 0920 07/10/18 0946  BP: (!) 107/56   Pulse: 76 73  Resp:    Temp: 37.6 C   SpO2: 95% 97%    Last Pain:  Vitals:   07/10/18 0920  TempSrc: Oral  PainSc:                  [place]

## 2018-07-10 NOTE — Progress Notes (Signed)
CRITICAL VALUE ALERT  Critical Value: Hb: 6.6  Date & Time Notied:  07/10/18 Provider Notified: X. Blount    Orders Received/Actions taken: Waiting call back

## 2018-07-10 NOTE — Progress Notes (Signed)
[facility]                                                                              Patient Demographics  Michael Stephens, is a 84 y.o. male, DOB - [birth year], [id number]  Admit date - 07/08/2018   Admitting Physician Kathie Dike, MD  Outpatient Primary MD for the patient is Practice, [facility]  Outpatient specialists:   LOS - 2  days   Medical records reviewed and are as summarized below:    Chief Complaint  Patient presents with  . Chest Pain       Brief summary   Patient is a 84 year old male with CAD, atrial flutter on anticoagulation, chronic diastolic CHF, BPH presented to ED with epigastric discomfort and left arm pain.  Patient reported on the morning of admission, left-sided arm pain and then epigastric discomfort radiating down to his abdomen, for several days to weeks.  Reported worsening after he eats or drinks anything, associated with dyspepsia, nausea but no vomiting.  He also described having dark stools but attributed to taking Pepto-Bismol.  Denied any frank hematochezia.  Denied taking any NSAIDs, fevers or cough.  He also reported dyspnea on exertion and dizziness for the last several days to weeks. In ED, EKG showed diffuse ST depressions, mildly elevated troponin, hemoglobin of 5.5 with baseline hemoglobin around 9, FOBT positive INR above 2.  Cardiology was consulted.   Assessment & Plan    Principal Problem:   NSTEMI (non-ST elevated myocardial infarction) [facility]) presenting with abdominal pain and chest pain -Troponin initial 0.2 -> 60.09-> 42.8.  Patient currently denies any active chest pain however concerning for ACS and worsened due to demand ischemia with profound anemia. -Cardiology following, not a candidate for antiplatelet or anticoagulation therapy due to severe anemia.  -2D echo showed severe hypokinesis of the left ventricular, entire inferolateral wall, EF 40 to 45%.  For diagnostic coronary angiography per  cardiology this weekend or Monday. -Started on low-dose metoprolol due to sinus tachycardia and runs of NSVT, 12.5 mg twice a day, borderline BP, unable to titrate up the dose -Per cardiology, started on statins.  Active Problems: Upper GI bleed with acute on chronic blood loss anemia -Patient reported epigastric discomfort with dyspepsia, melanotic stools for weeks -Hemoglobin 5.5 on admission, received 2 units of packed RBCs, hemoglobin again down to 6.6 today, transfuse 1 unit packed RBCs. -EGD on 3/27 showed minimal gastritis, mild duodenitis and mild distal esophagitis, no bleeding or ulcers.,  May need colonoscopy now given persisting anemia    Paroxysmal A. fib ([place]) with RVR -Currently not a candidate for anticoagulation given GI bleed and severe anemia -HR 100-120 this morning at the time of my examination, started on metoprolol 12.5 mg twice a day -Coumadin held due to GI bleed    Essential hypertension -Currently borderline BP, tachycardiac, receiving transfusion -Restarted on beta-blocker 12.5 mg twice a day  History of chronic diastolic CHF 2D echo 81/19 had shown EF of 55 to 60%, restrictive physiology with decreased left ventricular diastolic compliance. Follow closely, with blood transfusion, on Lasix at home, unable to start Lasix at this time due  to borderline BP  Code Status: Full CODE STATUS DVT Prophylaxis:   SCD's Family Communication: Discussed in detail with the patient, all imaging results, lab results explained to the patient  Disposition Plan: High risk of deterioration with severe anemia and NSTEMI, will remain inpatient  Time Spent in minutes 35 minutes  Procedures:  EGD  Consultants:   Cardiology Gastroenterology, Dr. Michail Sermon  Antimicrobials:   Anti-infectives (From admission, onward)   None         Medications  Scheduled Meds: . sodium chloride   Intravenous Once  . atorvastatin  80 mg Oral q1800  . finasteride  5 mg Oral Daily  .  metoprolol tartrate  12.5 mg Oral BID  . mometasone-formoterol  2 puff Inhalation BID  . pantoprazole (PROTONIX) IV  40 mg Intravenous Q12H  . sodium chloride flush  3 mL Intravenous Q12H  . tamsulosin  0.4 mg Oral Daily   Continuous Infusions: . sodium chloride     PRN Meds:.sodium chloride, acetaminophen **OR** acetaminophen, metoprolol tartrate, nitroGLYCERIN, ondansetron **OR** ondansetron (ZOFRAN) IV, sodium chloride flush      Subjective:   Coulson Wehner was seen and examined today.  Tachycardiac this morning, no dizziness or chest pain.  No epigastric pain at this time.  No overnight bleeding.  No fevers    Objective:   Vitals:   07/10/18 0838 07/10/18 0856 07/10/18 0920 07/10/18 0946  BP:  100/81 (!) 107/56   Pulse:  96 76 73  Resp:      Temp:   99.7 F (37.6 C)   TempSrc:   Oral   SpO2: 96% 95% 95% 97%  Weight:      Height:        Intake/Output Summary (Last 24 hours) at 07/10/2018 1049 Last data filed at 07/10/2018 0920 Gross per 24 hour  Intake 1655 ml  Output 300 ml  Net 1355 ml     Wt Readings from Last 3 Encounters:  07/10/18 95.8 kg  06/24/18 96.4 kg  04/04/18 91.4 kg    Physical Exam  General: Alert and oriented x 3, NAD  Eyes:  HEENT:  Atraumatic, normocephalic, no JVD.  Cardiovascular: irregularly irregular, tachycardia, trace pedal edema b/l  Respiratory: Decreased breath sound at the bases with bibasilar crackles  Gastrointestinal: Soft, nontender, nondistended, NBS  Ext: no pedal edema bilaterally  Neuro: no new deficits  Musculoskeletal: No cyanosis, clubbing  Skin: No rashes  Psych: Normal affect and demeanor, alert and oriented x3    Data Reviewed:  I have personally reviewed following labs and imaging studies  Micro Results No results found for this or any previous visit (from the past 240 hour(s)).  Radiology Reports Dg Chest Port 1 View  Result Date: 07/08/2018 CLINICAL DATA:  Shortness of breath EXAM:  PORTABLE CHEST 1 VIEW COMPARISON:  March 31, 2018 FINDINGS: Heart is mildly enlarged with mild pulmonary venous hypertension. No edema or consolidation. No adenopathy. No bone lesions. IMPRESSION: Pulmonary vascular congestion without edema or consolidation. Electronically Signed   By: Lowella Grip III M.D.   On: 07/08/2018 13:15    Lab Data:  CBC: Recent Labs  Lab 07/08/18 1245 07/08/18 2344 07/09/18 0745 07/10/18 0339  WBC 7.1 9.2 9.4 8.4  HGB 5.5* 6.4* 7.8* 6.6*  HCT 19.2* 21.5* 25.3* 21.5*  MCV 92.3 88.5 88.8 90.3  PLT 270 247 263 637   Basic Metabolic Panel: Recent Labs  Lab 07/08/18 1245 07/09/18 0745 07/10/18 0339 07/10/18 0849  NA 135 136 136  --  K 3.5 4.0 4.2  --   CL 99 100 103  --   CO2 25 26 27   --   GLUCOSE 183* 136* 120*  --   BUN 30* 20 19  --   CREATININE 0.83 0.91 0.94  --   CALCIUM 9.2 9.3 8.8*  --   MG  --   --   --  2.2   GFR: Estimated Creatinine Clearance: 72.8 mL/min (by C-G formula based on SCr of 0.94 mg/dL). Liver Function Tests: Recent Labs  Lab 07/08/18 1245  AST 23  ALT 18  ALKPHOS 55  BILITOT 0.2*  PROT 6.1*  ALBUMIN 3.6   Recent Labs  Lab 07/08/18 1245  LIPASE 26   No results for input(s): AMMONIA in the last 168 hours. Coagulation Profile: Recent Labs  Lab 07/08/18 1245 07/09/18 0745  INR 2.4* 1.9*   Cardiac Enzymes: Recent Labs  Lab 07/08/18 1245 07/08/18 2344 07/09/18 0745 07/09/18 1143  TROPONINI 0.20* 60.09* 42.84* 25.48*   BNP (last 3 results) No results for input(s): PROBNP in the last 8760 hours. HbA1C: No results for input(s): HGBA1C in the last 72 hours. CBG: No results for input(s): GLUCAP in the last 168 hours. Lipid Profile: Recent Labs    07/10/18 0339  CHOL 135  HDL 41  LDLCALC 85  TRIG 47  CHOLHDL 3.3   Thyroid Function Tests: No results for input(s): TSH, T4TOTAL, FREET4, T3FREE, THYROIDAB in the last 72 hours. Anemia Panel: No results for input(s): VITAMINB12, FOLATE,  FERRITIN, TIBC, IRON, RETICCTPCT in the last 72 hours. Urine analysis:    Component Value Date/Time   COLORURINE YELLOW 11/06/2017 0936   APPEARANCEUR HAZY (A) 11/06/2017 0936   LABSPEC 1.010 11/06/2017 0936   PHURINE 6.0 11/06/2017 0936   GLUCOSEU NEGATIVE 11/06/2017 0936   HGBUR SMALL (A) 11/06/2017 0936   BILIRUBINUR NEGATIVE 11/06/2017 0936   KETONESUR NEGATIVE 11/06/2017 0936   PROTEINUR NEGATIVE 11/06/2017 0936   NITRITE POSITIVE (A) 11/06/2017 0936   LEUKOCYTESUR LARGE (A) 11/06/2017 0936     Hunt Zajicek M.D. [facility] 07/10/2018, 10:49 AM  Pager: [phone] Between 7am to 7pm - call Pager - [phone]  After 7pm go to [email or web address] - password [email or web address]  Call night coverage person covering after 7pm

## 2018-07-10 NOTE — Progress Notes (Signed)
Converted from Afib to SR. Will get EKG to confirm.

## 2018-07-10 NOTE — Progress Notes (Signed)
Michael Stephens 12:13 PM  Subjective: Doing well without any obvious bleeding and no GI complaints and happy to eat and his hospital computer chart was reviewed and his case discussed with Dr. Michail Sermon and his endoscopy from yesterday reviewed as well as his previous colonoscopy from December 2018 by his primary gastroenterologist Dr. Melony Overly in [place] and his Coumadin is being held but his last INR was still increased  Objective: Vital signs stable afebrile no acute distress patient lying comfortably in the bed abdomen is soft nontender BUN and creatinine fine hemoglobin went from 6.4-7.82 and now 6.6  Assessment: GI blood loss and patient on blood thinners  Plan: We will proceed with a CT first to rule out anything significant and if he continues to have signs of bleeding might need another colonoscopy and will check on tomorrow  [facility] E  Pager [phone] After 5PM or if no answer call [phone]

## 2018-07-11 ENCOUNTER — Encounter (HOSPITAL_COMMUNITY): Payer: Self-pay | Admitting: Gastroenterology

## 2018-07-11 DIAGNOSIS — E785 Hyperlipidemia, unspecified: Secondary | ICD-10-CM

## 2018-07-11 LAB — TYPE AND SCREEN
ABO/RH(D): O NEG
Antibody Screen: NEGATIVE
Unit division: 0
Unit division: 0

## 2018-07-11 LAB — BPAM RBC
BLOOD PRODUCT EXPIRATION DATE: 202003312359
Blood Product Expiration Date: 202003272359
ISSUE DATE / TIME: 202003270208
ISSUE DATE / TIME: 202003280518
Unit Type and Rh: 9500
Unit Type and Rh: 9500

## 2018-07-11 LAB — BASIC METABOLIC PANEL
Anion gap: 11 (ref 5–15)
BUN: 18 mg/dL (ref 8–23)
CO2: 21 mmol/L — ABNORMAL LOW (ref 22–32)
Calcium: 8.7 mg/dL — ABNORMAL LOW (ref 8.9–10.3)
Chloride: 99 mmol/L (ref 98–111)
Creatinine, Ser: 0.86 mg/dL (ref 0.61–1.24)
GFR calc Af Amer: >60 mL/min (ref >60)
GFR calc non Af Amer: >60 mL/min (ref >60)
Glucose, Bld: 113 mg/dL — ABNORMAL HIGH (ref 70–99)
Potassium: 4.2 mmol/L (ref 3.5–5.1)
Sodium: 131 mmol/L — ABNORMAL LOW (ref 135–145)

## 2018-07-11 LAB — CBC
HCT: 26.1 % — ABNORMAL LOW (ref 39.0–52.0)
HEMOGLOBIN: 7.9 g/dL — AB (ref 13.0–17.0)
MCH: 27.2 pg (ref 26.0–34.0)
MCHC: 30.3 g/dL (ref 30.0–36.0)
MCV: 90 fL (ref 80.0–100.0)
Platelets: 196 10*3/uL (ref 150–400)
RBC: 2.9 MIL/uL — ABNORMAL LOW (ref 4.22–5.81)
RDW: 16 % — ABNORMAL HIGH (ref 11.5–15.5)
WBC: 8.1 10*3/uL (ref 4.0–10.5)
nRBC: 0.7 % — ABNORMAL HIGH (ref 0.0–0.2)

## 2018-07-11 LAB — HEMOGLOBIN AND HEMATOCRIT, BLOOD
HCT: 26.8 % — ABNORMAL LOW (ref 39.0–52.0)
Hemoglobin: 8.2 g/dL — ABNORMAL LOW (ref 13.0–17.0)

## 2018-07-11 LAB — PREPARE RBC (CROSSMATCH)

## 2018-07-11 LAB — PROTIME-INR
INR: 1.3 — AB (ref 0.8–1.2)
Prothrombin Time: 15.8 seconds — ABNORMAL HIGH (ref 11.4–15.2)

## 2018-07-11 MED ORDER — SODIUM CHLORIDE 0.9% IV SOLUTION: Freq: Once | INTRAVENOUS | Status: DC

## 2018-07-11 NOTE — Progress Notes (Signed)
[facility]                                                                              Patient Demographics  Michael Stephens, is a 84 y.o. male, DOB - [birth year], [id number]  Admit date - 07/08/2018   Admitting Physician Michael Dike, MD  Outpatient Primary MD for the patient is Practice, [facility]  Outpatient specialists:   LOS - 3  days   Medical records reviewed and are as summarized below:    Chief Complaint  Patient presents with  . Chest Pain       Brief summary   Patient is a 84 year old male with CAD, atrial flutter on anticoagulation, chronic diastolic CHF, BPH presented to ED with epigastric discomfort and left arm pain.  Patient reported on the morning of admission, left-sided arm pain and then epigastric discomfort radiating down to his abdomen, for several days to weeks.  Reported worsening after he eats or drinks anything, associated with dyspepsia, nausea but no vomiting.  He also described having dark stools but attributed to taking Pepto-Bismol.  Denied any frank hematochezia.  Denied taking any NSAIDs, fevers or cough.  He also reported dyspnea on exertion and dizziness for the last several days to weeks. In ED, EKG showed diffuse ST depressions, mildly elevated troponin, hemoglobin of 5.5 with baseline hemoglobin around 9, FOBT positive INR above 2.  Cardiology was consulted.   Assessment & Plan    Principal Problem:   NSTEMI (non-ST elevated myocardial infarction) [facility]) presenting with abdominal pain and chest pain -Troponin initial 0.2 -> 60.09-> 42.8.  No chest pain currently. -Cardiology following, not a candidate for antiplatelet or anticoagulation therapy due to severe anemia.  -2D echo-> severe hypokinesis of the left ventricular, entire inferolateral wall, EF 40-45%  -Per cardiology, started on statins and .  Conservative management for now, recommending transfusion to keep hemoglobin >9, will transfuse 1 more unit today,  hemoglobin 7.9  Active Problems: Upper GI bleed with acute on chronic blood loss anemia -Patient reported epigastric discomfort with dyspepsia, melanotic stools for weeks -Hemoglobin 5.5 on admission, patient has received 3 units packed RBCs so far, hemoglobin 7.9 today, will transfuse 1 more unit per cardiology recommendations to keep hb >9   -EGD on 3/27 showed minimal gastritis, mild duodenitis and mild distal esophagitis, no bleeding or ulcers.  -GI following, CT abdomen pelvis negative, will follow recommendations regarding colonoscopy    Paroxysmal A. fib ([place]) with RVR -Currently not a candidate for anticoagulation given GI bleed and severe anemia -Rate currently controlled, continue metoprolol -Coumadin held due to GI bleed    Essential hypertension -BP soft but stable, asymptomatic, continue on beta-blocker  History of chronic diastolic CHF 2D echo 91/47 had shown EF of 55 to 60%, restrictive physiology with decreased left ventricular diastolic compliance. Follow strict I's and O's, once BP stable, will place on low-dose Lasix to avoid fluid overload  Code Status: Full CODE STATUS DVT Prophylaxis:   SCD's Family Communication: Discussed in detail with the patient, all imaging results, lab results explained to the patient  Disposition Plan: High risk of deterioration with severe anemia and  NSTEMI, will remain inpatient  Time Spent in minutes 35 minutes  Procedures:  EGD  Consultants:   Cardiology Gastroenterology, Dr. Michail Stephens  Antimicrobials:   Anti-infectives (From admission, onward)   None         Medications  Scheduled Meds: . sodium chloride   Intravenous Once  . sodium chloride   Intravenous Once  . atorvastatin  80 mg Oral q1800  . finasteride  5 mg Oral Daily  . metoprolol tartrate  12.5 mg Oral BID  . mometasone-formoterol  2 puff Inhalation BID  . pantoprazole  40 mg Oral Daily  . sodium chloride flush  3 mL Intravenous Q12H  . tamsulosin   0.4 mg Oral Daily   Continuous Infusions: . sodium chloride     PRN Meds:.sodium chloride, acetaminophen **OR** acetaminophen, metoprolol tartrate, nitroGLYCERIN, ondansetron **OR** ondansetron (ZOFRAN) IV, sodium chloride flush      Subjective:   Michael Stephens was seen and examined today.  Sitting up in the bed, no complaints.  Heart rate controlled.  No chest pain or epigastric pain.  No nausea vomiting or diarrhea, no overnight bleeding.  No fevers, no acute issues overnight.   Objective:   Vitals:   07/10/18 1930 07/11/18 0026 07/11/18 0602 07/11/18 0736  BP: 118/74 (!) 145/73 (!) 109/55   Pulse: 94 66 71   Resp: 16 18 18    Temp: 98.3 F (36.8 C) 97.8 F (36.6 C) 98.2 F (36.8 C)   TempSrc: Oral Oral Oral   SpO2: 96% 94% 96% 95%  Weight:   97.9 kg   Height:        Intake/Output Summary (Last 24 hours) at 07/11/2018 1001 Last data filed at 07/11/2018 0900 Gross per 24 hour  Intake -  Output 200 ml  Net -200 ml     Wt Readings from Last 3 Encounters:  07/11/18 97.9 kg  06/24/18 96.4 kg  04/04/18 91.4 kg    Physical Exam  General: Alert and oriented x 3, NAD  Eyes:   HEENT:  Atraumatic, normocephalic  Cardiovascular: S1 S2 clear, RRR. No pedal edema b/l  Respiratory: Decreased breath sound at the bases  Gastrointestinal: Soft, nontender, nondistended, NBS  Ext: no pedal edema bilaterally  Neuro: no new deficits  Musculoskeletal: No cyanosis, clubbing  Skin: No rashes  Psych: Normal affect and demeanor, alert and oriented x3    Data Reviewed:  I have personally reviewed following labs and imaging studies  Micro Results No results found for this or any previous visit (from the past 240 hour(s)).  Radiology Reports Ct Abdomen Pelvis W Contrast  Result Date: 07/10/2018 CLINICAL DATA:  Unexplained anemia EXAM: CT ABDOMEN AND PELVIS WITH CONTRAST TECHNIQUE: Multidetector CT imaging of the abdomen and pelvis was performed using the standard  protocol following bolus administration of intravenous contrast. CONTRAST:  155mL OMNIPAQUE IOHEXOL 300 MG/ML SOLN, additional oral enteric contrast COMPARISON:  None. FINDINGS: Lower chest: Small bilateral pleural effusions, pleural calcifications, and associated atelectasis or consolidation. Coronary artery calcifications. Hepatobiliary: No focal liver abnormality is seen. No gallstones, gallbladder wall thickening, or biliary dilatation. Pancreas: Unremarkable. No pancreatic ductal dilatation or surrounding inflammatory changes. Spleen: Normal in size without focal abnormality. Adrenals/Urinary Tract: 2.9 cm soft tissue attenuation nodule of the left adrenal gland (series 7, image 28). Kidneys are normal, without renal calculi, focal lesion, or hydronephrosis. Thickening of the urinary bladder, likely related to chronic outlet obstruction. Stomach/Bowel: Stomach is within normal limits. Appendix appears normal. No evidence of bowel wall thickening, distention,  or inflammatory changes. Sigmoid diverticulosis without evidence of acute diverticulitis. Vascular/Lymphatic: Mixed calcific atherosclerosis. No enlarged abdominal or pelvic lymph nodes. Reproductive: No mass or other abnormality. Other: No abdominal wall hernia or abnormality. No abdominopelvic ascites. Musculoskeletal: No acute or significant osseous findings. IMPRESSION: 1. No CT findings of the abdomen or pelvis to explain anemia. 2. There is a 2.9 cm soft tissue attenuation nodule in the left adrenal gland. Recommend further evaluation with adrenal protocol CT or MRI if clinically appropriate. This recommendation follows ACR consensus guidelines: Management of Incidental Adrenal Masses: A White Paper of the ACR Incidental Findings Committee. J Am Coll Radiol 2017;14:1038-1044. 3. Small bilateral pleural effusions with associated atelectasis or consolidation, likely chronic with calcification of the pleura. 4. Other chronic and incidental findings as  detailed above. Electronically Signed   By: Eddie Candle M.D.   On: 07/10/2018 19:21   Dg Chest Port 1 View  Result Date: 07/08/2018 CLINICAL DATA:  Shortness of breath EXAM: PORTABLE CHEST 1 VIEW COMPARISON:  March 31, 2018 FINDINGS: Heart is mildly enlarged with mild pulmonary venous hypertension. No edema or consolidation. No adenopathy. No bone lesions. IMPRESSION: Pulmonary vascular congestion without edema or consolidation. Electronically Signed   By: Lowella Grip III M.D.   On: 07/08/2018 13:15    Lab Data:  CBC: Recent Labs  Lab 07/08/18 1245 07/08/18 2344 07/09/18 0745 07/10/18 0339 07/11/18 0251  WBC 7.1 9.2 9.4 8.4 8.1  HGB 5.5* 6.4* 7.8* 6.6* 7.9*  HCT 19.2* 21.5* 25.3* 21.5* 26.1*  MCV 92.3 88.5 88.8 90.3 90.0  PLT 270 247 263 212 314   Basic Metabolic Panel: Recent Labs  Lab 07/08/18 1245 07/09/18 0745 07/10/18 0339 07/10/18 0849 07/11/18 0251  NA 135 136 136  --  131*  K 3.5 4.0 4.2  --  4.2  CL 99 100 103  --  99  CO2 25 26 27   --  21*  GLUCOSE 183* 136* 120*  --  113*  BUN 30* 20 19  --  18  CREATININE 0.83 0.91 0.94  --  0.86  CALCIUM 9.2 9.3 8.8*  --  8.7*  MG  --   --   --  2.2  --    GFR: Estimated Creatinine Clearance: 80.3 mL/min (by C-G formula based on SCr of 0.86 mg/dL). Liver Function Tests: Recent Labs  Lab 07/08/18 1245  AST 23  ALT 18  ALKPHOS 55  BILITOT 0.2*  PROT 6.1*  ALBUMIN 3.6   Recent Labs  Lab 07/08/18 1245  LIPASE 26   No results for input(s): AMMONIA in the last 168 hours. Coagulation Profile: Recent Labs  Lab 07/08/18 1245 07/09/18 0745 07/11/18 0251  INR 2.4* 1.9* 1.3*   Cardiac Enzymes: Recent Labs  Lab 07/08/18 1245 07/08/18 2344 07/09/18 0745 07/09/18 1143  TROPONINI 0.20* 60.09* 42.84* 25.48*   BNP (last 3 results) No results for input(s): PROBNP in the last 8760 hours. HbA1C: No results for input(s): HGBA1C in the last 72 hours. CBG: No results for input(s): GLUCAP in the last 168  hours. Lipid Profile: Recent Labs    07/10/18 0339  CHOL 135  HDL 41  LDLCALC 85  TRIG 47  CHOLHDL 3.3   Thyroid Function Tests: No results for input(s): TSH, T4TOTAL, FREET4, T3FREE, THYROIDAB in the last 72 hours. Anemia Panel: No results for input(s): VITAMINB12, FOLATE, FERRITIN, TIBC, IRON, RETICCTPCT in the last 72 hours. Urine analysis:    Component Value Date/Time   COLORURINE YELLOW 11/06/2017 0936  APPEARANCEUR HAZY (A) 11/06/2017 0936   LABSPEC 1.010 11/06/2017 0936   PHURINE 6.0 11/06/2017 0936   GLUCOSEU NEGATIVE 11/06/2017 0936   HGBUR SMALL (A) 11/06/2017 0936   BILIRUBINUR NEGATIVE 11/06/2017 0936   KETONESUR NEGATIVE 11/06/2017 0936   PROTEINUR NEGATIVE 11/06/2017 0936   NITRITE POSITIVE (A) 11/06/2017 0936   LEUKOCYTESUR LARGE (A) 11/06/2017 0936     Kolbie Clarkston M.D. [facility] 07/11/2018, 10:01 AM  Pager: [phone] Between 7am to 7pm - call Pager - [phone]  After 7pm go to [email or web address] - password [email or web address]  Call night coverage person covering after 7pm

## 2018-07-11 NOTE — Progress Notes (Signed)
Progress Note  Patient Name: Michael Stephens Date of Encounter: 07/11/2018  Primary Cardiologist: Carlyle Dolly, MD   Subjective   He denies chest pain and no change in baseline exertional dyspnea.  No significant palpitations.  He has some abdominal pressure when he coughs.  Inpatient Medications    Scheduled Meds: . sodium chloride   Intravenous Once  . atorvastatin  80 mg Oral q1800  . finasteride  5 mg Oral Daily  . metoprolol tartrate  12.5 mg Oral BID  . mometasone-formoterol  2 puff Inhalation BID  . pantoprazole  40 mg Oral Daily  . sodium chloride flush  3 mL Intravenous Q12H  . tamsulosin  0.4 mg Oral Daily   Continuous Infusions: . sodium chloride     PRN Meds: sodium chloride, acetaminophen **OR** acetaminophen, metoprolol tartrate, nitroGLYCERIN, ondansetron **OR** ondansetron (ZOFRAN) IV, sodium chloride flush   Vital Signs    Vitals:   07/10/18 1930 07/11/18 0026 07/11/18 0602 07/11/18 0736  BP: 118/74 (!) 145/73 (!) 109/55   Pulse: 94 66 71   Resp: 16 18 18    Temp: 98.3 F (36.8 C) 97.8 F (36.6 C) 98.2 F (36.8 C)   TempSrc: Oral Oral Oral   SpO2: 96% 94% 96% 95%  Weight:   97.9 kg   Height:        Intake/Output Summary (Last 24 hours) at 07/11/2018 0826 Last data filed at 07/10/2018 0920 Gross per 24 hour  Intake 315 ml  Output -  Net 315 ml   Filed Weights   07/08/18 1925 07/10/18 0346 07/11/18 0602  Weight: 97.5 kg 95.8 kg 97.9 kg    Telemetry    Sinus rhythm, PACs, frequent PVCs with multiple runs of NSVT- Personally Reviewed  ECG    NA - Personally Reviewed  Physical Exam   GEN: No acute distress.   Neck: No JVD Cardiac: RRR, no murmurs, rubs, or gallops.  Respiratory:  Diminished breath sounds at bases bilaterally GI: Soft, nontender, non-distended  MS: No significant edema; No deformity. Neuro:  Nonfocal  Psych: Normal affect   Labs    Chemistry Recent Labs  Lab 07/08/18 1245 07/09/18 0745 07/10/18 0339  07/11/18 0251  NA 135 136 136 131*  K 3.5 4.0 4.2 4.2  CL 99 100 103 99  CO2 25 26 27  21*  GLUCOSE 183* 136* 120* 113*  BUN 30* 20 19 18   CREATININE 0.83 0.91 0.94 0.86  CALCIUM 9.2 9.3 8.8* 8.7*  PROT 6.1*  --   --   --   ALBUMIN 3.6  --   --   --   AST 23  --   --   --   ALT 18  --   --   --   ALKPHOS 55  --   --   --   BILITOT 0.2*  --   --   --   GFRNONAA >60 >60 >60 >60  GFRAA >60 >60 >60 >60  ANIONGAP 11 10 6 11      Hematology Recent Labs  Lab 07/09/18 0745 07/10/18 0339 07/11/18 0251  WBC 9.4 8.4 8.1  RBC 2.85* 2.38* 2.90*  HGB 7.8* 6.6* 7.9*  HCT 25.3* 21.5* 26.1*  MCV 88.8 90.3 90.0  MCH 27.4 27.7 27.2  MCHC 30.8 30.7 30.3  RDW 16.2* 16.3* 16.0*  PLT 263 212 196    Cardiac Enzymes Recent Labs  Lab 07/08/18 1245 07/08/18 2344 07/09/18 0745 07/09/18 1143  TROPONINI 0.20* 60.09* 42.84* 25.48*  Recent Labs  Lab 07/08/18 1252  TROPIPOC 0.15*     BNP Recent Labs  Lab 07/09/18 1255  BNP 769.3*     DDimer No results for input(s): DDIMER in the last 168 hours.   Radiology    Ct Abdomen Pelvis W Contrast  Result Date: 07/10/2018 CLINICAL DATA:  Unexplained anemia EXAM: CT ABDOMEN AND PELVIS WITH CONTRAST TECHNIQUE: Multidetector CT imaging of the abdomen and pelvis was performed using the standard protocol following bolus administration of intravenous contrast. CONTRAST:  154mL OMNIPAQUE IOHEXOL 300 MG/ML SOLN, additional oral enteric contrast COMPARISON:  None. FINDINGS: Lower chest: Small bilateral pleural effusions, pleural calcifications, and associated atelectasis or consolidation. Coronary artery calcifications. Hepatobiliary: No focal liver abnormality is seen. No gallstones, gallbladder wall thickening, or biliary dilatation. Pancreas: Unremarkable. No pancreatic ductal dilatation or surrounding inflammatory changes. Spleen: Normal in size without focal abnormality. Adrenals/Urinary Tract: 2.9 cm soft tissue attenuation nodule of the left  adrenal gland (series 7, image 28). Kidneys are normal, without renal calculi, focal lesion, or hydronephrosis. Thickening of the urinary bladder, likely related to chronic outlet obstruction. Stomach/Bowel: Stomach is within normal limits. Appendix appears normal. No evidence of bowel wall thickening, distention, or inflammatory changes. Sigmoid diverticulosis without evidence of acute diverticulitis. Vascular/Lymphatic: Mixed calcific atherosclerosis. No enlarged abdominal or pelvic lymph nodes. Reproductive: No mass or other abnormality. Other: No abdominal wall hernia or abnormality. No abdominopelvic ascites. Musculoskeletal: No acute or significant osseous findings. IMPRESSION: 1. No CT findings of the abdomen or pelvis to explain anemia. 2. There is a 2.9 cm soft tissue attenuation nodule in the left adrenal gland. Recommend further evaluation with adrenal protocol CT or MRI if clinically appropriate. This recommendation follows ACR consensus guidelines: Management of Incidental Adrenal Masses: A White Paper of the ACR Incidental Findings Committee. J Am Coll Radiol 2017;14:1038-1044. 3. Small bilateral pleural effusions with associated atelectasis or consolidation, likely chronic with calcification of the pleura. 4. Other chronic and incidental findings as detailed above. Electronically Signed   By: Eddie Candle M.D.   On: 07/10/2018 19:21    Cardiac Studies   2D Doppler echocardiogram 07/09/2018:   IMPRESSIONS  1. Severe hypokinesis of the left ventricular, entire inferolateral wall. 2. The left ventricle has mild-moderately reduced systolic function, with an ejection fraction of 40-45%. The cavity size was normal. There is mild asymmetric left ventricular hypertrophy. Left ventricular diastolic Doppler parameters are consistent  with impaired relaxation. 3. The right ventricle has normal systolc function. The cavity was normal. There is no increase in right ventricular wall thickness.  Right ventricular systolic pressure is moderately elevated with an estimated pressure of 63.7 mmHg. 4. Left atrial size was moderately dilated. 5. Right atrial size was moderately dilated. 6. The mitral valve is myxomatous. Mild thickening of the mitral valve leaflet. 7. The aortic valve is tricuspid Mild thickening of the aortic valve Mild calcification of the aortic valve. Aortic valve regurgitation is trivial by color flow Doppler. 8. There is mild dilatation of the aortic root measuring 40 mm. 9. The inferior vena cava was dilated in size with >50% respiratory variability. 10. No intracardiac thrombi or masses were visualized.  Patient Profile     84 y.o. male with PMH of atrial arrhythmias with Afib/aflutter/MAT, chronic diastolic HF, COPD who presented with chest pain and found to have Hgb 5.5.  Assessment & Plan    1. NSTEMI: Troponins trending down to 25.48 on 3/27.  Not a candidate for antiplatelet/anticoagulation therapy at the present time given  severe anemia.  LVEF 40 to 45%.  Conservative management for now.  Currently on metoprolol 12.5 mg twice daily.  Runs of NSVT.  Mg 2.2.  Continue atorvastatin 80 mg.  Would recommend transfusing to hemoglobin greater than 9 as also recommended by Dr. Tamala Julian (Hgb 7.9 today).  Please refer to discussion in Dr. Thompson Caul note on 07/09/2018.  2.  Rapid atrial fibrillation: Presently in sinus rhythm.  Currently not a candidate for anticoagulation given severe anemia/GI bleed.  Remains on metoprolol 12.5 mg twice daily.  3.  Severe anemia: GI following.  EGD 3/27 showed grade B reflux esophagitis and acute gastritis.    CT of the abdomen showed no obvious etiology for anemia.  He will need a colonoscopy.    4.  NSVT: Currently on metoprolol 12.5 mg twice daily.  Due to fluctuating blood pressures I am unable to advance this dose.  Potassium 4.2.  Magnesium 2.2.  5.  Hyperlipidemia: LDL 85.    Atorvastatin 80 mg started 07/10/2018.  6.  Left  ventricular dysfunction: No significant volume overload.  Small bilateral pleural effusions with associated atelectases or consolidation seen on abdominal CT on 07/02/2018.     For questions or updates, please contact [place] Please consult [email or web address] for contact info under Cardiology/STEMI.      Signed, Kate Sable, MD  07/11/2018, 8:26 AM

## 2018-07-11 NOTE — Progress Notes (Signed)
Michael Stephens 10:21 AM  Subjective: Patient without any GI complaints and no signs of obvious bleeding and aware cardiology recommends colonoscopy which I discussed with the patient  Objective: Vital signs stable afebrile no acute distress abdomen is soft nontender hemoglobin back up after transfusion INR decreased CT okay  Assessment: GI blood loss on blood thinners  Plan: We will ask GI hospital team next week to decide about ?colonoscopy and would ask cardiology to decide on lowest possible dose of blood thinner dose or antiplatelet agents could consider capsule endoscopy first which would be easier and safer particularly if significant AVMs are seen and last colonoscopy was not that long ago  [facility]  Pager [phone] After 5PM or if no answer call [phone]

## 2018-07-12 DIAGNOSIS — I5041 Acute combined systolic (congestive) and diastolic (congestive) heart failure: Secondary | ICD-10-CM

## 2018-07-12 DIAGNOSIS — R931 Abnormal findings on diagnostic imaging of heart and coronary circulation: Secondary | ICD-10-CM

## 2018-07-12 LAB — BPAM RBC
BLOOD PRODUCT EXPIRATION DATE: 202004222359
Blood Product Expiration Date: 202004212359
Blood Product Expiration Date: 202004052359
ISSUE DATE / TIME: 202003261707
ISSUE DATE / TIME: 202003291210
Unit Type and Rh: 9500
Unit Type and Rh: 9500
Unit Type and Rh: 9500

## 2018-07-12 LAB — TYPE AND SCREEN
ABO/RH(D): O NEG
ABO/RH(D): O NEG
Antibody Screen: NEGATIVE
Antibody Screen: NEGATIVE
UNIT DIVISION: 0
UNIT DIVISION: 0
Unit division: 0

## 2018-07-12 LAB — CBC
HEMATOCRIT: 26.1 % — AB (ref 39.0–52.0)
Hemoglobin: 8.3 g/dL — ABNORMAL LOW (ref 13.0–17.0)
MCH: 28.3 pg (ref 26.0–34.0)
MCHC: 31.8 g/dL (ref 30.0–36.0)
MCV: 89.1 fL (ref 80.0–100.0)
Platelets: 208 10*3/uL (ref 150–400)
RBC: 2.93 MIL/uL — ABNORMAL LOW (ref 4.22–5.81)
RDW: 17.2 % — AB (ref 11.5–15.5)
WBC: 7.6 10*3/uL (ref 4.0–10.5)
nRBC: 0.7 % — ABNORMAL HIGH (ref 0.0–0.2)

## 2018-07-12 LAB — BASIC METABOLIC PANEL
Anion gap: 8 (ref 5–15)
BUN: 18 mg/dL (ref 8–23)
CO2: 24 mmol/L (ref 22–32)
Calcium: 8.6 mg/dL — ABNORMAL LOW (ref 8.9–10.3)
Chloride: 101 mmol/L (ref 98–111)
Creatinine, Ser: 0.82 mg/dL (ref 0.61–1.24)
GFR calc Af Amer: >60 mL/min (ref >60)
GFR calc non Af Amer: >60 mL/min (ref >60)
Glucose, Bld: 121 mg/dL — ABNORMAL HIGH (ref 70–99)
Potassium: 4.2 mmol/L (ref 3.5–5.1)
Sodium: 133 mmol/L — ABNORMAL LOW (ref 135–145)

## 2018-07-12 MED ORDER — PEG 3350-KCL-NA BICARB-NACL 420 G PO SOLR
4000.0000 mL | Freq: Once | ORAL | Status: AC
  Administered 2018-07-12: 4000 mL via ORAL
  Filled 2018-07-12: qty 4000

## 2018-07-12 MED ORDER — FUROSEMIDE 40 MG PO TABS
40.0000 mg | ORAL_TABLET | Freq: Every day | ORAL | Status: DC
  Administered 2018-07-12 – 2018-07-14 (×3): 40 mg via ORAL
  Filled 2018-07-12 (×3): qty 1

## 2018-07-12 MED ORDER — POLYETHYLENE GLYCOL 3350 17 G PO PACK
17.0000 g | PACK | Freq: Two times a day (BID) | ORAL | Status: AC
  Filled 2018-07-12: qty 1

## 2018-07-12 MED ORDER — SODIUM CHLORIDE 0.9 % IV SOLN
INTRAVENOUS | Status: DC
  Administered 2018-07-12: 16:00:00 via INTRAVENOUS

## 2018-07-12 NOTE — TOC Initial Note (Addendum)
Transition of Care [facility]) - Initial/Assessment Note    Patient Details  Name: Michael Stephens MRN: [id number] Date of Birth: [birth year]  [facility] CM/SW Contact:    Bethena Roys, RN Phone Number: 07/12/2018, 2:20 PM  Clinical Narrative:  Pt presented for epigastric and chest pain with a low hemoglobin. PTA independent from home with spouse. Gets medications appropriately without any problems from [facility]. Patient hopes to transition home without needing any home health  Services @ this time. CM will continue to monitor for any transition of care needs. Plan for colonoscopy 07-13-18                  Expected Discharge Plan: Home/Self Care Barriers to Discharge: Continued Medical Work up   Patient Goals and CMS Choice Patient states their goals for this hospitalization and ongoing recovery are:: "to get back home to feed my cats and dogs" CMS Medicare.gov Compare Post Acute Care list provided to:: (N/A) Choice offered to / list presented to : NA  Expected Discharge Plan and Services Expected Discharge Plan: Home/Self Care In-house Referral: NA Discharge Planning Services: CM Consult Post Acute Care Choice: NA Living arrangements for the past 2 months: Single Family Home                 DME Arranged: N/A DME Agency: NA HH Arranged: NA HH Agency: NA  Prior Living Arrangements/Services Living arrangements for the past 2 months: Single Family Home Lives with:: Spouse Patient language and need for interpreter reviewed:: Yes Do you feel safe going back to the place where you live?: Yes          Current home services: (N/A) Criminal Activity/Legal Involvement Pertinent to Current Situation/Hospitalization: No - Comment as needed  Activities of Daily Living Home Assistive Devices/Equipment: None ADL Screening (condition at time of admission) Patient's cognitive ability adequate to safely complete daily activities?: Yes Is the patient deaf or have  difficulty hearing?: No Does the patient have difficulty seeing, even when wearing glasses/contacts?: No Does the patient have difficulty concentrating, remembering, or making decisions?: No Patient able to express need for assistance with ADLs?: Yes Does the patient have difficulty dressing or bathing?: No Independently performs ADLs?: Yes (appropriate for developmental age) Does the patient have difficulty walking or climbing stairs?: No Weakness of Legs: None Weakness of Arms/Hands: None  Permission Sought/Granted                  Emotional Assessment Appearance:: Appears stated age Attitude/Demeanor/Rapport: Engaged Affect (typically observed): Accepting Orientation: : Oriented to Self, Oriented to Place, Oriented to  Time, Oriented to Situation Alcohol / Substance Use: Not Applicable Psych Involvement: No (comment)  Admission diagnosis:  Upper GI bleed [K92.2] NSTEMI (non-ST elevated myocardial infarction) ([place]) [I21.4] Symptomatic anemia [D64.9] Patient Active Problem List   Diagnosis Date Noted  . NSTEMI (non-ST elevated myocardial infarction) ([place]) 07/09/2018  . Anemia 07/08/2018  . Heme positive stool 07/08/2018  . Coagulopathy ([place]) 07/08/2018  . Chronic diastolic CHF (congestive heart failure) ([place]) 07/08/2018  . Upper GI bleed 07/08/2018  . Shortness of breath 04/01/2018  . BPH (benign prostatic hyperplasia) 04/01/2018  . AF (paroxysmal atrial fibrillation) ([place]) 04/01/2018  . Acute on chronic systolic heart failure ([place]) 03/31/2018  . Urinary retention 09/07/2017  . Constipation 09/07/2017  . CHF exacerbation ([place]) 09/04/2017  . Acute on chronic diastolic CHF (congestive heart failure) ([place]) 09/04/2017  . Encounter for therapeutic drug monitoring 08/27/2017  . Change in stool  03/18/2017  . Diarrhea 03/02/2017  . Aortic stenosis 03/02/2017  . CAP (community acquired pneumonia) 02/16/2017  . Essential hypertension 02/11/2017  . Demand ischemia ([place])  02/11/2017  . Acute diastolic CHF (congestive heart failure) ([place]) 02/11/2017  . Acute respiratory failure with hypoxia ([place]) 02/11/2017  . Paroxysmal atrial flutter ([place]) 02/09/2017   PCP:  [facility], [place]:   [address], [place] [address] Phone: [phone] Fax: [phone]     Social Determinants of Health (SDOH) Interventions    Readmission Risk Interventions No flowsheet data found.

## 2018-07-12 NOTE — Progress Notes (Signed)
[facility] Progress Note  Michael Stephens 84 y.o. 09/04/1934  CC: Anemia, occult blood positive stool   Subjective: No acute GI issues.  Denies abdominal pain.  Denies blood in the stool or black stool.  ROS : Positive for mild dyspnea negative for acute chest pain.  Positive for leg cramps.   Objective: Vital signs in last 24 hours: Vitals:   07/12/18 0345 07/12/18 0753  BP: 134/68 136/76  Pulse: 67 72  Resp: 20 18  Temp: 98.2 F (36.8 C) 98.5 F (36.9 C)  SpO2: 96% 96%    Physical Exam:  General.  Well developed.  Not in acute distress ABD.  Soft, nontender, nondistended, bowel sounds present.  No peritoneal signs Neuro.  Alert/oriented x3 PSych.  Mood and affect normal  Lab Results: Recent Labs    07/10/18 0849 07/11/18 0251 07/12/18 0341  NA  --  131* 133*  K  --  4.2 4.2  CL  --  99 101  CO2  --  21* 24  GLUCOSE  --  113* 121*  BUN  --  18 18  CREATININE  --  0.86 0.82  CALCIUM  --  8.7* 8.6*  MG 2.2  --   --    No results for input(s): AST, ALT, ALKPHOS, BILITOT, PROT, ALBUMIN in the last 72 hours. Recent Labs    07/11/18 0251 07/11/18 1809 07/12/18 0341  WBC 8.1  --  7.6  HGB 7.9* 8.2* 8.3*  HCT 26.1* 26.8* 26.1*  MCV 90.0  --  89.1  PLT 196  --  208   Recent Labs    07/11/18 0251  LABPROT 15.8*  INR 1.3*      Assessment/Plan: -Anemia with occult blood positive stool. EGD 03/27 showed mild gastritis, duodenitis and mild esophagitis. - NSTEMI./ acute coronary syndrome. -H/O  atrial fibrillation.  Recommendations ------------------------- -Antiplatelets/anticoagulation currently on hold because of concern for GI bleed.  Patient is not having any overt or active GI bleeding - colonoscopy tomorrow. - clear  Liquid diet today.  N.p.o. past midnight  Risks (bleeding, infection, bowel perforation that could require surgery, sedation-related changes in cardiopulmonary systems), benefits (identification and possible treatment of  source of symptoms, exclusion of certain causes of symptoms), and alternatives (watchful waiting, radiographic imaging studies, empiric medical treatment)  were explained to patient/family in detail and patient wishes to proceed.  Otis Brace MD, [place] 07/12/2018, 9:15 AM  Contact #  [phone]

## 2018-07-12 NOTE — Progress Notes (Signed)
[facility]                                                                              Patient Demographics  Michael Stephens, is a 84 y.o. male, DOB - 1934/09/22, JFH:[id number]  Admit date - 07/08/2018   Admitting Physician Kathie Dike, MD  Outpatient Primary MD for the patient is Practice, [facility]  Outpatient specialists:   LOS - 4  days   Medical records reviewed and are as summarized below:    Chief Complaint  Patient presents with  . Chest Pain       Brief summary   Patient is a 84 year old male with CAD, atrial flutter on anticoagulation, chronic diastolic CHF, BPH presented to ED with epigastric discomfort and left arm pain.  Patient reported on the morning of admission, left-sided arm pain and then epigastric discomfort radiating down to his abdomen, for several days to weeks.  Reported worsening after he eats or drinks anything, associated with dyspepsia, nausea but no vomiting.  He also described having dark stools but attributed to taking Pepto-Bismol.  Denied any frank hematochezia.  Denied taking any NSAIDs, fevers or cough.  He also reported dyspnea on exertion and dizziness for the last several days to weeks. In ED, EKG showed diffuse ST depressions, mildly elevated troponin, hemoglobin of 5.5 with baseline hemoglobin around 9, FOBT positive INR above 2.  Cardiology was consulted.   Assessment & Plan    Principal Problem:   NSTEMI (non-ST elevated myocardial infarction) [facility]) presenting with abdominal pain and chest pain -Troponin initial 0.2 -> 60.09-> 42.8.  No chest pain currently. -Cardiology following, not a candidate for antiplatelet or anticoagulation therapy due to severe anemia.  -2D echo-> severe hypokinesis of the left ventricular, entire inferolateral wall, EF 40-45%  -Appreciate cardiology recommendations, continue beta-blocker at low-dose, plan for cardiac cath after the colonoscopy  Active Problems: Upper GI bleed  with acute on chronic blood loss anemia -Patient reported epigastric discomfort with dyspepsia, melanotic stools for weeks -Hemoglobin 5.5 on admission,received 4 units packed RBCs so far, hemoglobin 8.3 today -EGD on 3/27 showed minimal gastritis, mild duodenitis and mild distal esophagitis, no bleeding or ulcers.  -GI following, CT abdomen pelvis negative.  Plan for colonoscopy on 3/31    Paroxysmal A. fib (HCC) with RVR -Currently not a candidate for anticoagulation given GI bleed and severe anemia -Currently in NSR, rate controlled, continue metoprolol  -Coumadin held due to GI bleed, cardiology recommended to stay off of anticoagulation and antiplatelet agents for at least 1 month.      Essential hypertension -Stable, continue beta-blocker  History of chronic diastolic CHF 2D echo 93/73 had shown EF of 55 to 60%, restrictive physiology with decreased left ventricular diastolic compliance. - Patient is on Lasix 80 mg qdaily at home, has received multiple packed RBC transfusions, restarted low-dose Lasix 40 mg daily -Strict I's and O's and daily weights, net +2 L  Code Status: Full CODE STATUS DVT Prophylaxis:   SCD's Family Communication: Discussed in detail with the patient, all imaging results, lab results explained to the patient  Disposition Plan: High risk of deterioration with severe  anemia and NSTEMI, will remain inpatient  Time Spent in minutes 25 minutes  Procedures:  EGD  Consultants:   Cardiology Gastroenterology, Dr. Michail Sermon  Antimicrobials:   Anti-infectives (From admission, onward)   None         Medications  Scheduled Meds: . sodium chloride   Intravenous Once  . sodium chloride   Intravenous Once  . atorvastatin  80 mg Oral q1800  . finasteride  5 mg Oral Daily  . furosemide  40 mg Oral Daily  . metoprolol tartrate  12.5 mg Oral BID  . mometasone-formoterol  2 puff Inhalation BID  . pantoprazole  40 mg Oral Daily  . polyethylene glycol  17 g  Oral BID  . polyethylene glycol-electrolytes  4,000 mL Oral Once  . sodium chloride flush  3 mL Intravenous Q12H  . tamsulosin  0.4 mg Oral Daily   Continuous Infusions: . sodium chloride     PRN Meds:.sodium chloride, acetaminophen **OR** acetaminophen, metoprolol tartrate, nitroGLYCERIN, ondansetron **OR** ondansetron (ZOFRAN) IV, sodium chloride flush      Subjective:   [name] was seen and examined today.  No complaints, just tired of being in the hospital.  NSR, heart rate controlled.  No chest pain or epigastric pain.  No nausea vomiting or diarrhea, no overnight bleeding.  BP better, no fevers, no acute issues.   Objective:   Vitals:   07/11/18 2044 07/11/18 2154 07/12/18 0345 07/12/18 0753  BP: (!) 105/52 117/88 134/68 136/76  Pulse: 70 71 67 72  Resp: 18  20 18   Temp: 98.6 F (37 C)  98.2 F (36.8 C) 98.5 F (36.9 C)  TempSrc: Oral  Oral Oral  SpO2: 95%  96% 96%  Weight:   98.3 kg   Height:        Intake/Output Summary (Last 24 hours) at 07/12/2018 1039 Last data filed at 07/11/2018 2130 Gross per 24 hour  Intake 570 ml  Output -  Net 570 ml     Wt Readings from Last 3 Encounters:  07/12/18 98.3 kg  06/24/18 96.4 kg  04/04/18 91.4 kg   Physical Exam  General: Alert and oriented x 3, NAD  Eyes:   HEENT:  Atraumatic, normocephalic  Cardiovascular: S1 S2 clear, RRR, trace pedal edema b/l  Respiratory: Decreased breath sound at the bases  Gastrointestinal: Soft, nontender, nondistended, NBS  Ext: trace pedal edema bilaterally  Neuro: no new deficits  Musculoskeletal: No cyanosis, clubbing  Skin: No rashes  Psych: Normal affect and demeanor, alert and oriented x3    Data Reviewed:  I have personally reviewed following labs and imaging studies  Micro Results No results found for this or any previous visit (from the past 240 hour(s)).  Radiology Reports Ct Abdomen Pelvis W Contrast  Result Date: 07/10/2018 CLINICAL DATA:   Unexplained anemia EXAM: CT ABDOMEN AND PELVIS WITH CONTRAST TECHNIQUE: Multidetector CT imaging of the abdomen and pelvis was performed using the standard protocol following bolus administration of intravenous contrast. CONTRAST:  117mL OMNIPAQUE IOHEXOL 300 MG/ML SOLN, additional oral enteric contrast COMPARISON:  None. FINDINGS: Lower chest: Small bilateral pleural effusions, pleural calcifications, and associated atelectasis or consolidation. Coronary artery calcifications. Hepatobiliary: No focal liver abnormality is seen. No gallstones, gallbladder wall thickening, or biliary dilatation. Pancreas: Unremarkable. No pancreatic ductal dilatation or surrounding inflammatory changes. Spleen: Normal in size without focal abnormality. Adrenals/Urinary Tract: 2.9 cm soft tissue attenuation nodule of the left adrenal gland (series 7, image 28). Kidneys are normal, without renal calculi, focal  lesion, or hydronephrosis. Thickening of the urinary bladder, likely related to chronic outlet obstruction. Stomach/Bowel: Stomach is within normal limits. Appendix appears normal. No evidence of bowel wall thickening, distention, or inflammatory changes. Sigmoid diverticulosis without evidence of acute diverticulitis. Vascular/Lymphatic: Mixed calcific atherosclerosis. No enlarged abdominal or pelvic lymph nodes. Reproductive: No mass or other abnormality. Other: No abdominal wall hernia or abnormality. No abdominopelvic ascites. Musculoskeletal: No acute or significant osseous findings. IMPRESSION: 1. No CT findings of the abdomen or pelvis to explain anemia. 2. There is a 2.9 cm soft tissue attenuation nodule in the left adrenal gland. Recommend further evaluation with adrenal protocol CT or MRI if clinically appropriate. This recommendation follows ACR consensus guidelines: Management of Incidental Adrenal Masses: A White Paper of the ACR Incidental Findings Committee. J Am Coll Radiol 2017;14:1038-1044. 3. Small bilateral  pleural effusions with associated atelectasis or consolidation, likely chronic with calcification of the pleura. 4. Other chronic and incidental findings as detailed above. Electronically Signed   By: Eddie Candle M.D.   On: 07/10/2018 19:21   Dg Chest Port 1 View  Result Date: 07/08/2018 CLINICAL DATA:  Shortness of breath EXAM: PORTABLE CHEST 1 VIEW COMPARISON:  March 31, 2018 FINDINGS: Heart is mildly enlarged with mild pulmonary venous hypertension. No edema or consolidation. No adenopathy. No bone lesions. IMPRESSION: Pulmonary vascular congestion without edema or consolidation. Electronically Signed   By: Lowella Grip III M.D.   On: 07/08/2018 13:15    Lab Data:  CBC: Recent Labs  Lab 07/08/18 2344 07/09/18 0745 07/10/18 0339 07/11/18 0251 07/11/18 1809 07/12/18 0341  WBC 9.2 9.4 8.4 8.1  --  7.6  HGB 6.4* 7.8* 6.6* 7.9* 8.2* 8.3*  HCT 21.5* 25.3* 21.5* 26.1* 26.8* 26.1*  MCV 88.5 88.8 90.3 90.0  --  89.1  PLT 247 263 212 196  --  638   Basic Metabolic Panel: Recent Labs  Lab 07/08/18 1245 07/09/18 0745 07/10/18 0339 07/10/18 0849 07/11/18 0251 07/12/18 0341  NA 135 136 136  --  131* 133*  K 3.5 4.0 4.2  --  4.2 4.2  CL 99 100 103  --  99 101  CO2 25 26 27   --  21* 24  GLUCOSE 183* 136* 120*  --  113* 121*  BUN 30* 20 19  --  18 18  CREATININE 0.83 0.91 0.94  --  0.86 0.82  CALCIUM 9.2 9.3 8.8*  --  8.7* 8.6*  MG  --   --   --  2.2  --   --    GFR: Estimated Creatinine Clearance: 84.4 mL/min (by C-G formula based on SCr of 0.82 mg/dL). Liver Function Tests: Recent Labs  Lab 07/08/18 1245  AST 23  ALT 18  ALKPHOS 55  BILITOT 0.2*  PROT 6.1*  ALBUMIN 3.6   Recent Labs  Lab 07/08/18 1245  LIPASE 26   No results for input(s): AMMONIA in the last 168 hours. Coagulation Profile: Recent Labs  Lab 07/08/18 1245 07/09/18 0745 07/11/18 0251  INR 2.4* 1.9* 1.3*   Cardiac Enzymes: Recent Labs  Lab 07/08/18 1245 07/08/18 2344 07/09/18 0745  07/09/18 1143  TROPONINI 0.20* 60.09* 42.84* 25.48*   BNP (last 3 results) No results for input(s): PROBNP in the last 8760 hours. HbA1C: No results for input(s): HGBA1C in the last 72 hours. CBG: No results for input(s): GLUCAP in the last 168 hours. Lipid Profile: Recent Labs    07/10/18 0339  CHOL 135  HDL 41  LDLCALC 85  TRIG 47  CHOLHDL 3.3   Thyroid Function Tests: No results for input(s): TSH, T4TOTAL, FREET4, T3FREE, THYROIDAB in the last 72 hours. Anemia Panel: No results for input(s): VITAMINB12, FOLATE, FERRITIN, TIBC, IRON, RETICCTPCT in the last 72 hours. Urine analysis:    Component Value Date/Time   COLORURINE YELLOW 11/06/2017 0936   APPEARANCEUR HAZY (A) 11/06/2017 0936   LABSPEC 1.010 11/06/2017 0936   PHURINE 6.0 11/06/2017 0936   GLUCOSEU NEGATIVE 11/06/2017 0936   HGBUR SMALL (A) 11/06/2017 0936   BILIRUBINUR NEGATIVE 11/06/2017 0936   KETONESUR NEGATIVE 11/06/2017 0936   PROTEINUR NEGATIVE 11/06/2017 0936   NITRITE POSITIVE (A) 11/06/2017 0936   LEUKOCYTESUR LARGE (A) 11/06/2017 0936     Tiea Manninen M.D. [facility] 07/12/2018, 10:39 AM  Pager: [phone] Between 7am to 7pm - call Pager - [phone]  After 7pm go to [email or web address] - password [email or web address]  Call night coverage person covering after 7pm

## 2018-07-12 NOTE — H&P (View-Only) (Signed)
[facility]astroenterology Progress Note  Michael Stephens 84 y.o. 1934/10/23  CC: Anemia, occult blood positive stool   Subjective: No acute GI issues.  Denies abdominal pain.  Denies blood in the stool or black stool.  ROS : Positive for mild dyspnea negative for acute chest pain.  Positive for leg cramps.   Objective: Vital signs in last 24 hours: Vitals:   07/12/18 0345 07/12/18 0753  BP: 134/68 136/76  Pulse: 67 72  Resp: 20 18  Temp: 98.2 F (36.8 C) 98.5 F (36.9 C)  SpO2: 96% 96%    Physical Exam:  General.  Well developed.  Not in acute distress ABD.  Soft, nontender, nondistended, bowel sounds present.  No peritoneal signs Neuro.  Alert/oriented x3 PSych.  Mood and affect normal  Lab Results: Recent Labs    07/10/18 0849 07/11/18 0251 07/12/18 0341  NA  --  131* 133*  K  --  4.2 4.2  CL  --  99 101  CO2  --  21* 24  GLUCOSE  --  113* 121*  BUN  --  18 18  CREATININE  --  0.86 0.82  CALCIUM  --  8.7* 8.6*  MG 2.2  --   --    No results for input(s): AST, ALT, ALKPHOS, BILITOT, PROT, ALBUMIN in the last 72 hours. Recent Labs    07/11/18 0251 07/11/18 1809 07/12/18 0341  WBC 8.1  --  7.6  HGB 7.9* 8.2* 8.3*  HCT 26.1* 26.8* 26.1*  MCV 90.0  --  89.1  PLT 196  --  208   Recent Labs    07/11/18 0251  LABPROT 15.8*  INR 1.3*      Assessment/Plan: -Anemia with occult blood positive stool. EGD 03/27 showed mild gastritis, duodenitis and mild esophagitis. - NSTEMI./ acute coronary syndrome. -H/O  atrial fibrillation.  Recommendations ------------------------- -Antiplatelets/anticoagulation currently on hold because of concern for GI bleed.  Patient is not having any overt or active GI bleeding - colonoscopy tomorrow. - clear  Liquid diet today.  N.p.o. past midnight  Risks (bleeding, infection, bowel perforation that could require surgery, sedation-related changes in cardiopulmonary systems), benefits (identification and possible treatment of  source of symptoms, exclusion of certain causes of symptoms), and alternatives (watchful waiting, radiographic imaging studies, empiric medical treatment)  were explained to patient/family in detail and patient wishes to proceed.  Otis Brace MD, [place] 07/12/2018, 9:15 AM  Contact #  [phone]

## 2018-07-12 NOTE — Progress Notes (Addendum)
Progress Note  Patient Name: Michael Stephens Date of Encounter: 07/12/2018  Primary Cardiologist: Carlyle Dolly, MD   Subjective   No events noted overnight.  Feels fine.  Legs not cramping anymore.  No chest pain or pressure.  No dyspnea.  --Starting to get grouchy, wanting to go home.  Inpatient Medications    Scheduled Meds: . sodium chloride   Intravenous Once  . sodium chloride   Intravenous Once  . atorvastatin  80 mg Oral q1800  . finasteride  5 mg Oral Daily  . metoprolol tartrate  12.5 mg Oral BID  . mometasone-formoterol  2 puff Inhalation BID  . pantoprazole  40 mg Oral Daily  . sodium chloride flush  3 mL Intravenous Q12H  . tamsulosin  0.4 mg Oral Daily   Continuous Infusions: . sodium chloride     PRN Meds: sodium chloride, acetaminophen **OR** acetaminophen, metoprolol tartrate, nitroGLYCERIN, ondansetron **OR** ondansetron (ZOFRAN) IV, sodium chloride flush   Vital Signs    Vitals:   07/11/18 2044 07/11/18 2154 07/12/18 0345 07/12/18 0753  BP: (!) 105/52 117/88 134/68 136/76  Pulse: 70 71 67 72  Resp: 18  20 18   Temp: 98.6 F (37 C)  98.2 F (36.8 C) 98.5 F (36.9 C)  TempSrc: Oral  Oral Oral  SpO2: 95%  96% 96%  Weight:   98.3 kg   Height:        Intake/Output Summary (Last 24 hours) at 07/12/2018 0823 Last data filed at 07/11/2018 2130 Gross per 24 hour  Intake 570 ml  Output 200 ml  Net 370 ml   Last 3 Weights 07/12/2018 07/11/2018 07/10/2018  Weight (lbs) 216 lb 11.2 oz 215 lb 14.4 oz 211 lb 3.2 oz  Weight (kg) 98.294 kg 97.932 kg 95.8 kg      Telemetry    Sinus rhythm with rates in the 80s to 90s.- Personally Reviewed  ECG    No new EKG- Personally Reviewed  Physical Exam   GEN: No acute distress.  Resting comfortably  Neck: No JVD or carotid bruit. Cardiac:  RRR.  No M/R/G.  Nondisplaced PMI. Respiratory:  Bilateral breath sounds diminished in the bases, normal effort.  No rales or rhonchi. GI:  Soft/NT/ND says any BS.   No HSM. MS:  No clubbing/cyanosis or edema. Neuro:  Nonfocal; A&O x3. Psych: Normal mood, and affect   Labs    Chemistry Recent Labs  Lab 07/08/18 1245  07/10/18 0339 07/11/18 0251 07/12/18 0341  NA 135   < > 136 131* 133*  K 3.5   < > 4.2 4.2 4.2  CL 99   < > 103 99 101  CO2 25   < > 27 21* 24  GLUCOSE 183*   < > 120* 113* 121*  BUN 30*   < > 19 18 18   CREATININE 0.83   < > 0.94 0.86 0.82  CALCIUM 9.2   < > 8.8* 8.7* 8.6*  PROT 6.1*  --   --   --   --   ALBUMIN 3.6  --   --   --   --   AST 23  --   --   --   --   ALT 18  --   --   --   --   ALKPHOS 55  --   --   --   --   BILITOT 0.2*  --   --   --   --   GFRNONAA >60   < > >  60 >60 >60  GFRAA >60   < > >60 >60 >60  ANIONGAP 11   < > 6 11 8    < > = values in this interval not displayed.     Hematology Recent Labs  Lab 07/10/18 0339 07/11/18 0251 07/11/18 1809 07/12/18 0341  WBC 8.4 8.1  --  7.6  RBC 2.38* 2.90*  --  2.93*  HGB 6.6* 7.9* 8.2* 8.3*  HCT 21.5* 26.1* 26.8* 26.1*  MCV 90.3 90.0  --  89.1  MCH 27.7 27.2  --  28.3  MCHC 30.7 30.3  --  31.8  RDW 16.3* 16.0*  --  17.2*  PLT 212 196  --  208    Cardiac Enzymes Recent Labs  Lab 07/08/18 1245 07/08/18 2344 07/09/18 0745 07/09/18 1143  TROPONINI 0.20* 60.09* 42.84* 25.48*    Recent Labs  Lab 07/08/18 1252  TROPIPOC 0.15*     BNP Recent Labs  Lab 07/09/18 1255  BNP 769.3*     DDimer No results for input(s): DDIMER in the last 168 hours.   Radiology    Ct Abdomen Pelvis W Contrast  Result Date: 07/10/2018 CLINICAL DATA:  Unexplained anemia EXAM: CT ABDOMEN AND PELVIS WITH CONTRAST TECHNIQUE: Multidetector CT imaging of the abdomen and pelvis was performed using the standard protocol following bolus administration of intravenous contrast. CONTRAST:  157mL OMNIPAQUE IOHEXOL 300 MG/ML SOLN, additional oral enteric contrast COMPARISON:  None. FINDINGS: Lower chest: Small bilateral pleural effusions, pleural calcifications, and associated  atelectasis or consolidation. Coronary artery calcifications. Hepatobiliary: No focal liver abnormality is seen. No gallstones, gallbladder wall thickening, or biliary dilatation. Pancreas: Unremarkable. No pancreatic ductal dilatation or surrounding inflammatory changes. Spleen: Normal in size without focal abnormality. Adrenals/Urinary Tract: 2.9 cm soft tissue attenuation nodule of the left adrenal gland (series 7, image 28). Kidneys are normal, without renal calculi, focal lesion, or hydronephrosis. Thickening of the urinary bladder, likely related to chronic outlet obstruction. Stomach/Bowel: Stomach is within normal limits. Appendix appears normal. No evidence of bowel wall thickening, distention, or inflammatory changes. Sigmoid diverticulosis without evidence of acute diverticulitis. Vascular/Lymphatic: Mixed calcific atherosclerosis. No enlarged abdominal or pelvic lymph nodes. Reproductive: No mass or other abnormality. Other: No abdominal wall hernia or abnormality. No abdominopelvic ascites. Musculoskeletal: No acute or significant osseous findings. IMPRESSION: 1. No CT findings of the abdomen or pelvis to explain anemia. 2. There is a 2.9 cm soft tissue attenuation nodule in the left adrenal gland. Recommend further evaluation with adrenal protocol CT or MRI if clinically appropriate. This recommendation follows ACR consensus guidelines: Management of Incidental Adrenal Masses: A White Paper of the ACR Incidental Findings Committee. J Am Coll Radiol 2017;14:1038-1044. 3. Small bilateral pleural effusions with associated atelectasis or consolidation, likely chronic with calcification of the pleura. 4. Other chronic and incidental findings as detailed above. Electronically Signed   By: Eddie Candle M.D.   On: 07/10/2018 19:21    Cardiac Studies   TTE: 07/09/2018: Severe hypokinesis of the inferolateral wall.  EF 40 and 45%.  Impaired relaxation.  Mild asymmetric LVH.  Normal RV function with elevated  RV pressures estimated at 64 million mercury.  Moderate RA and LA dilation.  Myxomatous MV.  Mild aortic calcification with no stenosis.  Mildly dilated aortic root-40 mm.  --Reduced EF and regional wall motion abnormality is new compared to prior echocardiogram within the last 6 months.  Patient Profile     84 y.o. male with PMH of atrial arrhythmias with Afib/aflutter/MAT, chronic diastolic  HF, COPD who presented with chest pain and found to have Hgb 5.5. As part of his evaluation ruled in for MI with troponins as high as 60.09.  Now trending down. Also complicated by acute combined systolic and diastolic heart failure -new reduced LVEF with diastolic dysfunction.  Assessment & Plan    1. NSTEMI (troponin levels little bit too high to consider simply related to anemia):  Overall very difficult scenario.  New regional wall motion normality with reduced EF on echo would suggest either RCA or circumflex MI.  Unfortunately, not a good candidate for cardiac catheterization-PCI because of recent severe GI bleed.  Unable to give aspirin or antiplatelet agent along with anticoagulation for A. Fib.  Please see previous discussion by Dr. Tamala Julian re: plans for conservative management.;  Continue beta-blocker at low dosing potentially consider nitrate as blood pressure tolerates.  Most likely has multivessel disease with abnormal echocardiogram and EKG --> Dr. Tamala Julian that suggested the possibility of diagnostic cardiac catheterization as early as today, however with plans for colonoscopy today with GI.  Would prefer to wait and see what they find. -->  Pending results, may consider diagnostic cath tomorrow  2.Rapid atrial fibrillation:  Currently in sinus rhythm.    Clearly in light of severe GI bleed not a candidate to restart anticoagulation (had previously been on DOAC and then was converted to Coumadin because of financial issues.  ->  For now I would plan to stay off of anticoagulation and  antiplatelet agent for at least 1 month to allow for outpatient reassessment with stable hemoglobin levels.  Of course today's GI procedure will be very telling as far as how safe it would be to restart.    Only able to tolerate low-dose beta-blocker for rate control.    3. Severe anemia: GI following. Hgb 7.9>>8.2>>8.3 EGD 3/27 showed grade B reflux esophagitis and acute gastritis. CT of the abdomen showed no obvious etiology for anemia. Plan for possible colonoscopy per GI.   --GOAL Hgb should be > 9.  4. NSVT: Currently on metoprolol 12.5 mg twice daily.Potassium 4.2.  Magnesium 2.2.   5. Hyperlipidemia: LDL 85.   Atorvastatin 80 mg started 07/10/2018.  6. Left ventricular dysfunction: No significant volume overload/euvolemic on exam.  Small bilateral pleural effusions with associated atelectases or consolidation seen on abdominal CT on 07/02/2018.  On standing oral diuretic.  Euvolemic.   For questions or updates, please contact [place] Please consult [email or web address] for contact info under   Signed, Reino Bellis, NP  07/12/2018, 8:23 AM     ATTENDING ATTESTATION  I have seen, examined and evaluated the patient this AM along with Reino Bellis, NP-C  After reviewing all the available data and chart, we discussed the patients laboratory, study & physical findings as well as symptoms in detail. I agree with her findings, examination as well as impression recommendations as per our discussion.    -Patient interview and physical exam performed by me  Attending adjustments noted in italics.   Overall plan for now conservative management for relatively large MI in the setting of severe anemia.  Treatment options limited by the fact that he is remains anemic and we are waiting complete GI evaluation. Would try to avoid anticoagulation and antiplatelet agents for at least a month to allow for healing.  In this vein of thinking, no rugged invasive options at this time.   Will discuss with interventional colleagues as to the utility of diagnostic catheterization prior to discharge.   ADDENDUM:  I discussed this case with Dr. Burt Knack in the Cath Lab - we both agree that there is really limited utility in diagnostic cardiac catheterization at this point as it would simply expose the patient to potential risks of an invasive procedure with no plans for intervention - even if the anatomy would be amenable, since we are trying to avoid Antiplatelet Rx with recent severe GI Bleed.  In the absence of any ongoing angina (with improved Hgb), considering a more staged approach with OP ischemc evaluation once the bleeding concerns have been resolved.     Glenetta Hew, M.D., M.S. Interventional Cardiologist   Pager # [phone] Phone # [phone] [address] [place], [place] [address]

## 2018-07-13 ENCOUNTER — Encounter (HOSPITAL_COMMUNITY)
Admission: EM | Disposition: A | Discharge status: Home / Self Care | Payer: Self-pay | Source: Home / Self Care | Attending: Internal Medicine

## 2018-07-13 ENCOUNTER — Inpatient Hospital Stay (HOSPITAL_COMMUNITY): Payer: Medicare PPO | Admitting: Certified Registered"

## 2018-07-13 ENCOUNTER — Encounter (HOSPITAL_COMMUNITY): Payer: Self-pay | Admitting: Certified Registered"

## 2018-07-13 DIAGNOSIS — D649 Anemia, unspecified: Secondary | ICD-10-CM

## 2018-07-13 HISTORY — PX: POLYPECTOMY: SHX5525

## 2018-07-13 HISTORY — PX: COLONOSCOPY WITH PROPOFOL: SHX5780

## 2018-07-13 HISTORY — PX: BIOPSY: SHX5522

## 2018-07-13 LAB — BASIC METABOLIC PANEL
Anion gap: 8 (ref 5–15)
BUN: 14 mg/dL (ref 8–23)
CO2: 25 mmol/L (ref 22–32)
Calcium: 8.5 mg/dL — ABNORMAL LOW (ref 8.9–10.3)
Chloride: 103 mmol/L (ref 98–111)
Creatinine, Ser: 0.75 mg/dL (ref 0.61–1.24)
GFR calc Af Amer: >60 mL/min (ref >60)
GFR calc non Af Amer: >60 mL/min (ref >60)
GLUCOSE: 97 mg/dL (ref 70–99)
Potassium: 4 mmol/L (ref 3.5–5.1)
Sodium: 136 mmol/L (ref 135–145)

## 2018-07-13 LAB — CBC
HEMATOCRIT: 25.9 % — AB (ref 39.0–52.0)
Hemoglobin: 8.1 g/dL — ABNORMAL LOW (ref 13.0–17.0)
MCH: 28.3 pg (ref 26.0–34.0)
MCHC: 31.3 g/dL (ref 30.0–36.0)
MCV: 90.6 fL (ref 80.0–100.0)
Platelets: 220 10*3/uL (ref 150–400)
RBC: 2.86 MIL/uL — ABNORMAL LOW (ref 4.22–5.81)
RDW: 17.7 % — ABNORMAL HIGH (ref 11.5–15.5)
WBC: 7.1 10*3/uL (ref 4.0–10.5)
nRBC: 0.7 % — ABNORMAL HIGH (ref 0.0–0.2)

## 2018-07-13 SURGERY — COLONOSCOPY WITH PROPOFOL
Anesthesia: Monitor Anesthesia Care

## 2018-07-13 MED ORDER — PROPOFOL 10 MG/ML IV BOLUS
INTRAVENOUS | Status: DC | PRN
  Administered 2018-07-13: 80 mg via INTRAVENOUS
  Administered 2018-07-13: 20 mg via INTRAVENOUS
  Administered 2018-07-13: 50 mg via INTRAVENOUS
  Administered 2018-07-13: 20 mg via INTRAVENOUS
  Administered 2018-07-13: 30 mg via INTRAVENOUS
  Administered 2018-07-13: 50 mg via INTRAVENOUS

## 2018-07-13 MED ORDER — FENTANYL CITRATE (PF) 100 MCG/2ML IJ SOLN: 25.0000 ug | INTRAMUSCULAR | Status: DC | PRN

## 2018-07-13 MED ORDER — SODIUM CHLORIDE 0.9% FLUSH
3.0000 mL | Freq: Two times a day (BID) | INTRAVENOUS | Status: DC
  Administered 2018-07-13: 3 mL via INTRAVENOUS

## 2018-07-13 MED ORDER — SODIUM CHLORIDE 0.9 % WEIGHT BASED INFUSION
1.0000 mL/kg/h | INTRAVENOUS | Status: DC
  Administered 2018-07-14: 04:00:00 1 mL/kg/h via INTRAVENOUS

## 2018-07-13 MED ORDER — SODIUM CHLORIDE 0.9% FLUSH: 3.0000 mL | INTRAVENOUS | Status: DC | PRN

## 2018-07-13 MED ORDER — SODIUM CHLORIDE 0.9 % WEIGHT BASED INFUSION
3.0000 mL/kg/h | INTRAVENOUS | Status: DC
  Administered 2018-07-14: 04:00:00 3 mL/kg/h via INTRAVENOUS

## 2018-07-13 MED ORDER — LIDOCAINE 2% (20 MG/ML) 5 ML SYRINGE
INTRAMUSCULAR | Status: DC | PRN
  Administered 2018-07-13: 60 mg via INTRAVENOUS

## 2018-07-13 MED ORDER — LACTATED RINGERS IV SOLN
INTRAVENOUS | Status: DC | PRN
  Administered 2018-07-13: 09:00:00 via INTRAVENOUS

## 2018-07-13 MED ORDER — ONDANSETRON HCL 4 MG/2ML IJ SOLN: 4.0000 mg | Freq: Once | INTRAMUSCULAR | Status: DC | PRN

## 2018-07-13 MED ORDER — LACTATED RINGERS IV SOLN
INTRAVENOUS | Status: DC
  Administered 2018-07-13: 09:00:00 via INTRAVENOUS

## 2018-07-13 MED ORDER — SODIUM CHLORIDE 0.9 % IV SOLN: 250.0000 mL | INTRAVENOUS | Status: DC | PRN

## 2018-07-13 SURGICAL SUPPLY — 22 items

## 2018-07-13 NOTE — Anesthesia Preprocedure Evaluation (Signed)
Anesthesia Evaluation  Patient identified by MRN, date of birth, ID band Patient awake    Reviewed: Allergy & Precautions, NPO status , Patient's Chart, lab work & pertinent test results, reviewed documented beta blocker date and time   History of Anesthesia Complications Negative for: history of anesthetic complications  Airway Mallampati: II  TM Distance: >3 FB Neck ROM: Full    Dental  (+) Teeth Intact, Dental Advisory Given   Pulmonary shortness of breath, former smoker,    Pulmonary exam normal breath sounds clear to auscultation       Cardiovascular hypertension, Pt. on home beta blockers (-) angina+ Past MI and +CHF  Normal cardiovascular exam+ dysrhythmias Atrial Fibrillation  Rhythm:Regular Rate:Normal     Neuro/Psych negative neurological ROS  negative psych ROS   GI/Hepatic negative GI ROS, Neg liver ROS,   Endo/Other  negative endocrine ROS  Renal/GU negative Renal ROS     Musculoskeletal negative musculoskeletal ROS (+)   Abdominal   Peds  Hematology  (+) Blood dyscrasia (Coumadin), anemia ,   Anesthesia Other Findings Day of surgery medications reviewed with the patient.  Reproductive/Obstetrics                             Anesthesia Physical Anesthesia Plan  ASA: III  Anesthesia Plan: MAC   Post-op Pain Management:    Induction: Intravenous  PONV Risk Score and Plan: 1 and Propofol infusion and Treatment may vary due to age or medical condition  Airway Management Planned: Nasal Cannula and Natural Airway  Additional Equipment:   Intra-op Plan:   Post-operative Plan:   Informed Consent: I have reviewed the patients History and Physical, chart, labs and discussed the procedure including the risks, benefits and alternatives for the proposed anesthesia with the patient or authorized representative who has indicated his/her understanding and acceptance.      Dental advisory given  Plan Discussed with: CRNA and Anesthesiologist  Anesthesia Plan Comments:         Anesthesia Quick Evaluation

## 2018-07-13 NOTE — Progress Notes (Signed)
Patient returned from Colonoscopy. Notified Cardiology, okay for patient to start diet.  Heart healthy diet ordered.

## 2018-07-13 NOTE — H&P (View-Only) (Signed)
Progress Note  Patient Name: Michael Stephens Date of Encounter: 07/13/2018  Primary Cardiologist: Carlyle Dolly, MD   Subjective   Resting comfortably post colonoscopy.  No chest pain or pressure.  No dyspnea. Happy to hear nothing significant found on colonoscopy today.  Inpatient Medications    Scheduled Meds: . sodium chloride   Intravenous Once  . sodium chloride   Intravenous Once  . atorvastatin  80 mg Oral q1800  . finasteride  5 mg Oral Daily  . furosemide  40 mg Oral Daily  . metoprolol tartrate  12.5 mg Oral BID  . mometasone-formoterol  2 puff Inhalation BID  . pantoprazole  40 mg Oral Daily  . sodium chloride flush  3 mL Intravenous Q12H  . tamsulosin  0.4 mg Oral Daily   Continuous Infusions: . sodium chloride    . lactated ringers 10 mL/hr at 07/13/18 0839   PRN Meds: sodium chloride, acetaminophen **OR** acetaminophen, metoprolol tartrate, nitroGLYCERIN, ondansetron **OR** ondansetron (ZOFRAN) IV, sodium chloride flush   Vital Signs    Vitals:   07/13/18 1001 07/13/18 1010 07/13/18 1025 07/13/18 1118  BP: (!) 124/51 111/64  124/63  Pulse: 82 66 64 (!) 54  Resp: (!) 24 (!) 29 16 16   Temp: 98.9 F (37.2 C)   98 F (36.7 C)  TempSrc: Oral   Oral  SpO2: 100% 97% 96% 100%  Weight:      Height:        Intake/Output Summary (Last 24 hours) at 07/13/2018 1602 Last data filed at 07/13/2018 1500 Gross per 24 hour  Intake 642.11 ml  Output 150 ml  Net 492.11 ml   Last 3 Weights 07/13/2018 07/13/2018 07/12/2018  Weight (lbs) 213 lb 13.5 oz 213 lb 12.8 oz 216 lb 11.2 oz  Weight (kg) 97 kg 96.979 kg 98.294 kg      Telemetry    Sinus rhythm with rates in the 80s to 90s.- Personally Reviewed  ECG    No new EKG- Personally Reviewed  Physical Exam   GEN:  Resting comfortably in bed.  No acute distress.  Has nasal Cannula oxygen on. Neck:  No carotid bruit or JVD Cardiac:  RRR with rare ectopy.  Normal S1 and S2.  No obvious M/R/G. Respiratory:   CTA B with mild interstitial sounds.  No wheezes rales or rhonchi.  Nonlabored.   GI:  Soft/NT/ND-NABS.  No HSM MS:  No C/C/E Neuro:   Nonfocal, a&o x3 Psych: Normal mood and affect.  Labs    Chemistry Recent Labs  Lab 07/08/18 1245  07/11/18 0251 07/12/18 0341 07/13/18 0331  NA 135   < > 131* 133* 136  K 3.5   < > 4.2 4.2 4.0  CL 99   < > 99 101 103  CO2 25   < > 21* 24 25  GLUCOSE 183*   < > 113* 121* 97  BUN 30*   < > 18 18 14   CREATININE 0.83   < > 0.86 0.82 0.75  CALCIUM 9.2   < > 8.7* 8.6* 8.5*  PROT 6.1*  --   --   --   --   ALBUMIN 3.6  --   --   --   --   AST 23  --   --   --   --   ALT 18  --   --   --   --   ALKPHOS 55  --   --   --   --  BILITOT 0.2*  --   --   --   --   GFRNONAA >60   < > >60 >60 >60  GFRAA >60   < > >60 >60 >60  ANIONGAP 11   < > 11 8 8    < > = values in this interval not displayed.     Hematology Recent Labs  Lab 07/11/18 0251 07/11/18 1809 07/12/18 0341 07/13/18 0331  WBC 8.1  --  7.6 7.1  RBC 2.90*  --  2.93* 2.86*  HGB 7.9* 8.2* 8.3* 8.1*  HCT 26.1* 26.8* 26.1* 25.9*  MCV 90.0  --  89.1 90.6  MCH 27.2  --  28.3 28.3  MCHC 30.3  --  31.8 31.3  RDW 16.0*  --  17.2* 17.7*  PLT 196  --  208 220    Cardiac Enzymes Recent Labs  Lab 07/08/18 1245 07/08/18 2344 07/09/18 0745 07/09/18 1143  TROPONINI 0.20* 60.09* 42.84* 25.48*    Recent Labs  Lab 07/08/18 1252  TROPIPOC 0.15*     BNP Recent Labs  Lab 07/09/18 1255  BNP 769.3*     DDimer No results for input(s): DDIMER in the last 168 hours.   Radiology    No results found.   Colonoscopy report from today shows 3 transverse colon polyps 2 descending colon polyps and diverticuli as well as hemorrhoids.  But no obvious sign of bleeding found.  Cardiac Studies   TTE: 07/09/2018: Severe hypokinesis of the inferolateral wall.  EF 40 and 45%.  Impaired relaxation.  Mild asymmetric LVH.  Normal RV function with elevated RV pressures estimated at 64 million mercury.   Moderate RA and LA dilation.  Myxomatous MV.  Mild aortic calcification with no stenosis.  Mildly dilated aortic root-40 mm.  --Reduced EF and regional wall motion abnormality is new compared to prior echocardiogram within the last 6 months.  Patient Profile     84 y.o. male with PMH of atrial arrhythmias with Afib/aflutter/MAT, chronic diastolic HF, COPD who presented with chest pain and found to have Hgb 5.5. As part of his evaluation ruled in for MI with troponins as high as 60.09.  Now trending down. Also complicated by acute combined systolic and diastolic heart failure -new reduced LVEF with diastolic dysfunction.  Assessment & Plan    1. NSTEMI (troponin levels little bit too high to consider simply related to anemia):  Overall very difficult scenario.  New regional wall motion normality with reduced EF on echo would suggest either RCA or circumflex MI (and his previous Myoview 2 years ago showed possible old inferolateral infarct with peri-infarct ischemia).  Unfortunately, not a good candidate for cardiac catheterization-PCI because of recent severe GI bleed.    Unable to give aspirin or antiplatelet agent along with anticoagulation for A. Fib.  Will discuss with Dr. Tamala Julian in the morning who is here to perform cardiac catheterizations.  By his recommendations last week he suggested diagnostic catheterization.  We will tentatively schedule him for tomorrow morning to make him n.p.o. after midnight.  This will be for diagnostic cath only.  Please see previous discussion by Dr. Tamala Julian re: plans for conservative management.;  Continue beta-blocker at low dosing potentially consider nitrate as blood pressure tolerates.  2.Rapid atrial fibrillation:  Currently in sinus rhythm.  On beta-blocker.  Not on any anticoagulation for reasons noted elsewhere.  Plan to hold anticoagulation and antiplatelet agents for least 1 month.  After roughly 1 month, would probably try to restart DOAC  (Eliquis)  to avoid warfarin issues.  Also, he states that he thinks he heard that Eliquis will be more affordable.  (Defer to Dr. Harl Bowie)     Only able to tolerate low-dose beta-blocker for rate control with intermittent heart rates in the 50s.  3. Severe anemia: GI following. Hgb 7.9>>8.2>>8.3 EGD 3/27 showed grade B reflux esophagitis and acute gastritis. CT of the abdomen showed no obvious etiology for anemia. Plan for possible colonoscopy per GI.  And now colonoscopy showed no acute findings. --GOAL Hgb should be > 9, however has been stable for at least 3 checks.  At this point I think with the COVID-19 restrictions, additional transfusion has been limited due to low supply of blood bank blood.  4. NSVT: Continue low-dose metoprolol 12.5 mg twice daily and maintain stable magnesium and potassium levels.   5. Hyperlipidemia: LDL 85.   Atorvastatin 80 mg started 07/10/2018.  Continue statin  6. Left ventricular dysfunction: No significant volume overload/euvolemic on exam.  Small bilateral pleural effusions with associated atelectases or consolidation seen on abdominal CT on 07/02/2018.  On standing oral diuretic.  Euvolemic..  Wall motion abnormality on echo was consistent with abnormal findings on prior nuclear stress test.  This would suggest a potential total occlusion of the circumflex or posterior lateral branch of the RCA.  On close to home dose of oral furosemide  If blood pressure will tolerate, would like to consider starting ACE inhibitor/ARB.   Since the patient is can continue to be inpatient into the morning, will discuss with Dr. Tamala Julian who knows the patient well and discuss if he would like to proceed with diagnostic catheterization.  Tentatively scheduled for second case tomorrow with Dr. Tamala Julian.  Would otherwise likely be stable from a cardiology standpoint for discharge post-cath.  For questions or updates, please contact [place] Please consult  [email or web address] for contact info under   Signed, Glenetta Hew, MD  07/13/2018, 4:02 PM      Glenetta Hew, M.D., M.S. Interventional Cardiologist   Pager # [phone] Phone # [phone] [address] [place], [place] [address]

## 2018-07-13 NOTE — Care Management Important Message (Signed)
Important Message  Patient Details  Name: Michael Stephens MRN: [id number] Date of Birth: 01-Dec-1934   Medicare Important Message Given:       Orbie Pyo 07/13/2018, 4:18 PM

## 2018-07-13 NOTE — Interval H&P Note (Signed)
History and Physical Interval Note:  07/13/2018 8:58 AM  Michael Stephens  has presented today for surgery, with the diagnosis of Anemia.  The various methods of treatment have been discussed with the patient and family. After consideration of risks, benefits and other options for treatment, the patient has consented to  Procedure(s): COLONOSCOPY WITH PROPOFOL (N/A) as a surgical intervention.  The patient's history has been reviewed, patient examined, no change in status, stable for surgery.  I have reviewed the patient's chart and labs.  Questions were answered to the patient's satisfaction.    Risks (bleeding, infection, bowel perforation that could require surgery, sedation-related changes in cardiopulmonary systems), benefits (identification and possible treatment of source of symptoms, exclusion of certain causes of symptoms), and alternatives (watchful waiting, radiographic imaging studies, empiric medical treatment)  were explained to patient/family in detail and patient wishes to proceed. Derl Abalos

## 2018-07-13 NOTE — Progress Notes (Signed)
Pt had 6 beat run V-tach. Pt is asymptomatic. Will continue to monitor

## 2018-07-13 NOTE — Brief Op Note (Signed)
07/08/2018 - 07/13/2018  10:09 AM  PATIENT:  Michael Stephens  84 y.o. male  PRE-OPERATIVE DIAGNOSIS:  Anemia  POST-OPERATIVE DIAGNOSIS:  3 transverse colon polyps removed with biopsy forceps and cold snare; tissue biopsy of lypoma vs polyp at hepatic flexure, 2 descending colon polyp; diverticula; hemorroids  PROCEDURE:  Procedure(s): COLONOSCOPY WITH PROPOFOL (N/A) POLYPECTOMY BIOPSY  SURGEON:  Surgeon(s) and Role:    * Bravery Ketcham, MD - Primary  Findings ---------- -Colonoscopy showed few colon polyps which was removed with cold snare and biopsy forcep, medium sized lipoma diabetic flexure, diverticulosis and large internal hemorrhoids. -No evidence of active bleeding in the colon as well as in the terminal ileum.  Recommendations ------------------------ -Start heart healthy diet ( if ok with cardiology)  - Monitor H and H  -Okay to proceed with further cardiac work-up from GI standpoint. -GI will follow  Otis Brace MD, [place] 07/13/2018, 10:12 AM  Contact #  [phone]

## 2018-07-13 NOTE — Progress Notes (Signed)
Progress Note  Patient Name: Michael Stephens Date of Encounter: 07/13/2018  Primary Cardiologist: Carlyle Dolly, MD   Subjective   Resting comfortably post colonoscopy.  No chest pain or pressure.  No dyspnea. Happy to hear nothing significant found on colonoscopy today.  Inpatient Medications    Scheduled Meds: . sodium chloride   Intravenous Once  . sodium chloride   Intravenous Once  . atorvastatin  80 mg Oral q1800  . finasteride  5 mg Oral Daily  . furosemide  40 mg Oral Daily  . metoprolol tartrate  12.5 mg Oral BID  . mometasone-formoterol  2 puff Inhalation BID  . pantoprazole  40 mg Oral Daily  . sodium chloride flush  3 mL Intravenous Q12H  . tamsulosin  0.4 mg Oral Daily   Continuous Infusions: . sodium chloride    . lactated ringers 10 mL/hr at 07/13/18 0839   PRN Meds: sodium chloride, acetaminophen **OR** acetaminophen, metoprolol tartrate, nitroGLYCERIN, ondansetron **OR** ondansetron (ZOFRAN) IV, sodium chloride flush   Vital Signs    Vitals:   07/13/18 1001 07/13/18 1010 07/13/18 1025 07/13/18 1118  BP: (!) 124/51 111/64  124/63  Pulse: 82 66 64 (!) 54  Resp: (!) 24 (!) 29 16 16   Temp: 98.9 F (37.2 C)   98 F (36.7 C)  TempSrc: Oral   Oral  SpO2: 100% 97% 96% 100%  Weight:      Height:        Intake/Output Summary (Last 24 hours) at 07/13/2018 1602 Last data filed at 07/13/2018 1500 Gross per 24 hour  Intake 642.11 ml  Output 150 ml  Net 492.11 ml   Last 3 Weights 07/13/2018 07/13/2018 07/12/2018  Weight (lbs) 213 lb 13.5 oz 213 lb 12.8 oz 216 lb 11.2 oz  Weight (kg) 97 kg 96.979 kg 98.294 kg      Telemetry    Sinus rhythm with rates in the 80s to 90s.- Personally Reviewed  ECG    No new EKG- Personally Reviewed  Physical Exam   GEN:  Resting comfortably in bed.  No acute distress.  Has nasal Cannula oxygen on. Neck:  No carotid bruit or JVD Cardiac:  RRR with rare ectopy.  Normal S1 and S2.  No obvious M/R/G. Respiratory:   CTA B with mild interstitial sounds.  No wheezes rales or rhonchi.  Nonlabored.   GI:  Soft/NT/ND-NABS.  No HSM MS:  No C/C/E Neuro:   Nonfocal, a&o x3 Psych: Normal mood and affect.  Labs    Chemistry Recent Labs  Lab 07/08/18 1245  07/11/18 0251 07/12/18 0341 07/13/18 0331  NA 135   < > 131* 133* 136  K 3.5   < > 4.2 4.2 4.0  CL 99   < > 99 101 103  CO2 25   < > 21* 24 25  GLUCOSE 183*   < > 113* 121* 97  BUN 30*   < > 18 18 14   CREATININE 0.83   < > 0.86 0.82 0.75  CALCIUM 9.2   < > 8.7* 8.6* 8.5*  PROT 6.1*  --   --   --   --   ALBUMIN 3.6  --   --   --   --   AST 23  --   --   --   --   ALT 18  --   --   --   --   ALKPHOS 55  --   --   --   --  BILITOT 0.2*  --   --   --   --   GFRNONAA >60   < > >60 >60 >60  GFRAA >60   < > >60 >60 >60  ANIONGAP 11   < > 11 8 8    < > = values in this interval not displayed.     Hematology Recent Labs  Lab 07/11/18 0251 07/11/18 1809 07/12/18 0341 07/13/18 0331  WBC 8.1  --  7.6 7.1  RBC 2.90*  --  2.93* 2.86*  HGB 7.9* 8.2* 8.3* 8.1*  HCT 26.1* 26.8* 26.1* 25.9*  MCV 90.0  --  89.1 90.6  MCH 27.2  --  28.3 28.3  MCHC 30.3  --  31.8 31.3  RDW 16.0*  --  17.2* 17.7*  PLT 196  --  208 220    Cardiac Enzymes Recent Labs  Lab 07/08/18 1245 07/08/18 2344 07/09/18 0745 07/09/18 1143  TROPONINI 0.20* 60.09* 42.84* 25.48*    Recent Labs  Lab 07/08/18 1252  TROPIPOC 0.15*     BNP Recent Labs  Lab 07/09/18 1255  BNP 769.3*     DDimer No results for input(s): DDIMER in the last 168 hours.   Radiology    No results found.   Colonoscopy report from today shows 3 transverse colon polyps 2 descending colon polyps and diverticuli as well as hemorrhoids.  But no obvious sign of bleeding found.  Cardiac Studies   TTE: 07/09/2018: Severe hypokinesis of the inferolateral wall.  EF 40 and 45%.  Impaired relaxation.  Mild asymmetric LVH.  Normal RV function with elevated RV pressures estimated at 64 million mercury.   Moderate RA and LA dilation.  Myxomatous MV.  Mild aortic calcification with no stenosis.  Mildly dilated aortic root-40 mm.  --Reduced EF and regional wall motion abnormality is new compared to prior echocardiogram within the last 6 months.  Patient Profile     84 y.o. male with PMH of atrial arrhythmias with Afib/aflutter/MAT, chronic diastolic HF, COPD who presented with chest pain and found to have Hgb 5.5. As part of his evaluation ruled in for MI with troponins as high as 60.09.  Now trending down. Also complicated by acute combined systolic and diastolic heart failure -new reduced LVEF with diastolic dysfunction.  Assessment & Plan    1. NSTEMI (troponin levels little bit too high to consider simply related to anemia):  Overall very difficult scenario.  New regional wall motion normality with reduced EF on echo would suggest either RCA or circumflex MI (and his previous Myoview 2 years ago showed possible old inferolateral infarct with peri-infarct ischemia).  Unfortunately, not a good candidate for cardiac catheterization-PCI because of recent severe GI bleed.    Unable to give aspirin or antiplatelet agent along with anticoagulation for A. Fib.  Will discuss with Dr. Tamala Julian in the morning who is here to perform cardiac catheterizations.  By his recommendations last week he suggested diagnostic catheterization.  We will tentatively schedule him for tomorrow morning to make him n.p.o. after midnight.  This will be for diagnostic cath only.  Please see previous discussion by Dr. Tamala Julian re: plans for conservative management.;  Continue beta-blocker at low dosing potentially consider nitrate as blood pressure tolerates.  2.Rapid atrial fibrillation:  Currently in sinus rhythm.  On beta-blocker.  Not on any anticoagulation for reasons noted elsewhere.  Plan to hold anticoagulation and antiplatelet agents for least 1 month.  After roughly 1 month, would probably try to restart DOAC  (Eliquis)  to avoid warfarin issues.  Also, he states that he thinks he heard that Eliquis will be more affordable.  (Defer to Dr. Harl Bowie)     Only able to tolerate low-dose beta-blocker for rate control with intermittent heart rates in the 50s.  3. Severe anemia: GI following. Hgb 7.9>>8.2>>8.3 EGD 3/27 showed grade B reflux esophagitis and acute gastritis. CT of the abdomen showed no obvious etiology for anemia. Plan for possible colonoscopy per GI.  And now colonoscopy showed no acute findings. --GOAL Hgb should be > 9, however has been stable for at least 3 checks.  At this point I think with the COVID-19 restrictions, additional transfusion has been limited due to low supply of blood bank blood.  4. NSVT: Continue low-dose metoprolol 12.5 mg twice daily and maintain stable magnesium and potassium levels.   5. Hyperlipidemia: LDL 85.   Atorvastatin 80 mg started 07/10/2018.  Continue statin  6. Left ventricular dysfunction: No significant volume overload/euvolemic on exam.  Small bilateral pleural effusions with associated atelectases or consolidation seen on abdominal CT on 07/02/2018.  On standing oral diuretic.  Euvolemic..  Wall motion abnormality on echo was consistent with abnormal findings on prior nuclear stress test.  This would suggest a potential total occlusion of the circumflex or posterior lateral branch of the RCA.  On close to home dose of oral furosemide  If blood pressure will tolerate, would like to consider starting ACE inhibitor/ARB.   Since the patient is can continue to be inpatient into the morning, will discuss with Dr. Tamala Julian who knows the patient well and discuss if he would like to proceed with diagnostic catheterization.  Tentatively scheduled for second case tomorrow with Dr. Tamala Julian.  Would otherwise likely be stable from a cardiology standpoint for discharge post-cath.  For questions or updates, please contact [place] Please consult  [email or web address] for contact info under   Signed, Glenetta Hew, MD  07/13/2018, 4:02 PM      Glenetta Hew, M.D., M.S. Interventional Cardiologist   Pager # [phone] Phone # [phone] [address] [place], [place] [address]

## 2018-07-13 NOTE — Anesthesia Procedure Notes (Signed)
Procedure Name: MAC Date/Time: 07/13/2018 9:35 AM Performed by: Orlie Dakin, CRNA Pre-anesthesia Checklist: Patient identified, Suction available, Patient being monitored and Emergency Drugs available Patient Re-evaluated:Patient Re-evaluated prior to induction Oxygen Delivery Method: Simple face mask Preoxygenation: Pre-oxygenation with 100% oxygen Induction Type: IV induction

## 2018-07-13 NOTE — Progress Notes (Signed)
Patient taken for colonoscopy at 0815hrs via WC.

## 2018-07-13 NOTE — Anesthesia Postprocedure Evaluation (Signed)
Anesthesia Post Note  Patient: Michael Stephens  Procedure(s) Performed: COLONOSCOPY WITH PROPOFOL (N/A ) POLYPECTOMY BIOPSY     Patient location during evaluation: Endoscopy Anesthesia Type: MAC Level of consciousness: awake and alert Pain management: pain level controlled Vital Signs Assessment: post-procedure vital signs reviewed and stable Respiratory status: spontaneous breathing, nonlabored ventilation, respiratory function stable and patient connected to nasal cannula oxygen Cardiovascular status: stable and blood pressure returned to baseline Postop Assessment: no apparent nausea or vomiting Anesthetic complications: no    Last Vitals:  Vitals:   07/13/18 1010 07/13/18 1025  BP: 111/64   Pulse: 66 64  Resp: (!) 29 16  Temp:    SpO2: 97% 96%    Last Pain:  Vitals:   07/13/18 1010  TempSrc:   PainSc: 0-No pain                 Catalina Gravel

## 2018-07-13 NOTE — Op Note (Signed)
[facility] Patient Name: Michael Stephens Procedure Date : 07/13/2018 MRN: [id number] Attending MD: Otis Brace , MD Date of Birth: 03-11-1935 CSN: [id number] Age: 84 Admit Type: Inpatient Procedure:                Colonoscopy Indications:              Last colonoscopy: December 2018, Heme positive                            stool, Iron deficiency anemia Providers:                Otis Brace, MD, Zenon Mayo, RN, Ladona Ridgel, Technician, Cletis Athens, Technician Referring MD:              Medicines:                Sedation Administered by an Anesthesia Professional Complications:            No immediate complications. Estimated Blood Loss:     Estimated blood loss was minimal. Procedure:                Pre-Anesthesia Assessment:                           - Prior to the procedure, a History and Physical                            was performed, and patient medications and                            allergies were reviewed. The patient's tolerance of                            previous anesthesia was also reviewed. The risks                            and benefits of the procedure and the sedation                            options and risks were discussed with the patient.                            All questions were answered, and informed consent                            was obtained. Prior Anticoagulants: The patient has                            taken Coumadin (warfarin). ASA Grade Assessment:                            III - A patient with severe systemic disease. After  reviewing the risks and benefits, the patient was                            deemed in satisfactory condition to undergo the                            procedure.                           After obtaining informed consent, the colonoscope                            was passed under direct vision. Throughout the       procedure, the patient's blood pressure, pulse, and                            oxygen saturations were monitored continuously. The                            PCF-H190DL ([id number] Olympus pediatric colonoscope                            was introduced through the anus and advanced to the                            the terminal ileum, with identification of the                            appendiceal orifice and IC valve. The colonoscopy                            was performed without difficulty. The patient                            tolerated the procedure well. The quality of the                            bowel preparation was good except the cecum and                            ascending colon was fair. Scope In: 9:34:12 AM Scope Out: 9:56:10 AM Scope Withdrawal Time: 0 hours 12 minutes 50 seconds  Total Procedure Duration: 0 hours 21 minutes 58 seconds  Findings:      Skin tags were found on perianal exam.      Hemorrhoids were found on perianal exam.      The terminal ileum appeared normal.      There was a medium-sized lipoma, at the hepatic flexure. Biopsies were       taken with a cold forceps for histology.      A 3 mm polyp was found in the transverse colon. The polyp was removed       with a cold biopsy forceps. Resection and retrieval were complete.      Two sessile polyps were found in the transverse colon. The polyps were 4  to 6 mm in size. These polyps were removed with a cold snare. Resection       and retrieval were complete.      A 5 mm polyp was found in the descending colon. The polyp was sessile.       The polyp was removed with a cold snare. Resection and retrieval were       complete.      A 2 mm polyp was found in the descending colon. The polyp was removed       with a cold biopsy forceps. Resection and retrieval were complete.      Multiple small and large-mouthed diverticula were found in the left       colon.      Internal hemorrhoids were found  during retroflexion. The hemorrhoids       were large.      There is no endoscopic evidence of bleeding in the entire colon. Impression:               - Perianal skin tags found on perianal exam.                           - Hemorrhoids found on perianal exam.                           - The examined portion of the ileum was normal.                           - Medium-sized lipoma at the hepatic flexure.                            Biopsied.                           - One 3 mm polyp in the transverse colon, removed                            with a cold biopsy forceps. Resected and retrieved.                           - Two 4 to 6 mm polyps in the transverse colon,                            removed with a cold snare. Resected and retrieved.                           - One 5 mm polyp in the descending colon, removed                            with a cold snare. Resected and retrieved.                           - One 2 mm polyp in the descending colon, removed                            with a cold biopsy forceps. Resected and retrieved.                           -  Diverticulosis in the left colon.                           - Internal hemorrhoids. Recommendation:           - Return patient to hospital ward for ongoing care.                           - Cardiac diet.                           - Continue present medications.                           - Await pathology results. Procedure Code(s):        --- Professional ---                           [phone], Colonoscopy, flexible; with removal of                            tumor(s), polyp(s), or other lesion(s) by snare                            technique                           [id number], 41, Colonoscopy, flexible; with biopsy,                            single or multiple Diagnosis Code(s):        --- Professional ---                           K64.8, Other hemorrhoids                           D17.5, Benign lipomatous neoplasm of                             intra-abdominal organs                           K63.5, Polyp of colon                           K64.4, Residual hemorrhoidal skin tags                           R19.5, Other fecal abnormalities                           D50.9, Iron deficiency anemia, unspecified                           K57.30, Diverticulosis of large intestine without                            perforation or abscess without bleeding CPT  copyright 2019 American Medical Association. All rights reserved. The codes documented in this report are preliminary and upon coder review may  be revised to meet current compliance requirements. Otis Brace, MD Otis Brace, MD 07/13/2018 10:09:35 AM Number of Addenda: 0

## 2018-07-13 NOTE — Transfer of Care (Signed)
Immediate Anesthesia Transfer of Care Note  Patient: Michael Stephens  Procedure(s) Performed: COLONOSCOPY WITH PROPOFOL (N/A ) POLYPECTOMY BIOPSY  Patient Location: Endoscopy Unit  Anesthesia Type:MAC  Level of Consciousness: awake and patient cooperative  Airway & Oxygen Therapy: Patient Spontanous Breathing and Patient connected to face mask oxygen  Post-op Assessment: Report given to RN and Post -op Vital signs reviewed and stable  Post vital signs: Reviewed and stable  Last Vitals:  Vitals Value Taken Time  BP 124/51 07/13/2018 10:01 AM  Temp    Pulse 83 07/13/2018 10:01 AM  Resp 25 07/13/2018 10:01 AM  SpO2 100 % 07/13/2018 10:01 AM  Vitals shown include unvalidated device data.  Last Pain:  Vitals:   07/13/18 1001  TempSrc: Oral  PainSc: 0-No pain      Patients Stated Pain Goal: 0 (69/48/54 6270)  Complications: No apparent anesthesia complications

## 2018-07-13 NOTE — Progress Notes (Signed)
[facility]                                                                              Patient Demographics  Michael Stephens, is a 84 y.o. male, DOB - 07/31/1934, VOJ:[id number]  Admit date - 07/08/2018   Admitting Physician Kathie Dike, MD  Outpatient Primary MD for the patient is [facility], [facility]  Outpatient specialists:   LOS - 5  days   Medical records reviewed and are as summarized below:    Chief Complaint  Patient presents with  . Chest Pain       Brief summary   Patient is a 84 year old male with CAD, atrial flutter on anticoagulation, chronic diastolic CHF, BPH presented to ED with epigastric discomfort and left arm pain.  Patient reported on the morning of admission, left-sided arm pain and then epigastric discomfort radiating down to his abdomen, for several days to weeks.  Reported worsening after he eats or drinks anything, associated with dyspepsia, nausea but no vomiting.  He also described having dark stools but attributed to taking Pepto-Bismol.  Denied any frank hematochezia.  Denied taking any NSAIDs, fevers or cough.  He also reported dyspnea on exertion and dizziness for the last several days to weeks. In ED, EKG showed diffuse ST depressions, mildly elevated troponin, hemoglobin of 5.5 with baseline hemoglobin around 9, FOBT positive INR above 2.  Cardiology was consulted.   Assessment & Plan    Principal Problem:   NSTEMI (non-ST elevated myocardial infarction) [facility]) presenting with abdominal pain and chest pain -Troponin initial 0.2 -> 60.09-> 42.8.  No chest pain currently. -Cardiology following, not a candidate for antiplatelet or anticoagulation therapy due to severe anemia.  -2D echo-> severe hypokinesis of the left ventricular, entire inferolateral wall, EF 40-45%  -Continue beta-blocker at low-dose -Colonoscopy done, will await cardiology recommendations regarding cardiac cath  Active Problems: Upper GI bleed with  acute on chronic blood loss anemia -Patient reported epigastric discomfort with dyspepsia, melanotic stools for weeks -Hemoglobin 5.5 on admission,received 4 units packed RBCs so far, hemoglobin 8.3 today -EGD on 3/27 showed minimal gastritis, mild duodenitis and mild distal esophagitis, no bleeding or ulcers.  -GI following, CT abdomen pelvis negative. -Colonoscopy today showed perianal skin tags with hemorrhoids medium-sized lipoma at the hepatic flexure, diverticulosis in the left colon    Paroxysmal A. fib (HCC) with RVR -Currently not a candidate for anticoagulation given GI bleed and severe anemia -Currently in NSR, rate controlled, continue metoprolol  -Coumadin held due to GI bleed, cardiology recommended to stay off of anticoagulation and antiplatelet agents for at least 1 month.      Essential hypertension -Stable, continue beta-blocker  History of chronic diastolic CHF 2D echo 99/37 had shown EF of 55 to 60%, restrictive physiology with decreased left ventricular diastolic compliance. - Patient is on Lasix 80 mg qdaily at home, has received multiple packed RBC transfusions, restarted low-dose Lasix 40 mg daily -Follow strict I's and O's  Code Status: Full CODE STATUS DVT Prophylaxis:   SCD's Family Communication: Discussed in detail with the patient, all imaging results, lab results explained to the patient  Disposition  Plan: High risk of deterioration with severe anemia and NSTEMI, will remain inpatient  Time Spent in minutes 25 minutes  Procedures:  EGD Colonoscopy 3/31  Consultants:   Cardiology Gastroenterology, Dr. Michail Sermon  Antimicrobials:   Anti-infectives (From admission, onward)   None         Medications  Scheduled Meds: . sodium chloride   Intravenous Once  . sodium chloride   Intravenous Once  . atorvastatin  80 mg Oral q1800  . finasteride  5 mg Oral Daily  . furosemide  40 mg Oral Daily  . metoprolol tartrate  12.5 mg Oral BID  .  mometasone-formoterol  2 puff Inhalation BID  . pantoprazole  40 mg Oral Daily  . sodium chloride flush  3 mL Intravenous Q12H  . tamsulosin  0.4 mg Oral Daily   Continuous Infusions: . sodium chloride    . lactated ringers 10 mL/hr at 07/13/18 0839   PRN Meds:.sodium chloride, acetaminophen **OR** acetaminophen, metoprolol tartrate, nitroGLYCERIN, ondansetron **OR** ondansetron (ZOFRAN) IV, sodium chloride flush      Subjective:   Michael Stephens was seen and examined today.  No new complaints.  No chest pain or epigastric pain or shortness of breath.  Normal sinus rhythm.  Heart rate controlled. No nausea vomiting.   Objective:   Vitals:   07/13/18 0903 07/13/18 1001 07/13/18 1010 07/13/18 1025  BP: (!) 147/70 (!) 124/51 111/64   Pulse: 76 82 66 64  Resp:  (!) 24 (!) 29 16  Temp:  98.9 F (37.2 C)    TempSrc:  Oral    SpO2:  100% 97% 96%  Weight:      Height:        Intake/Output Summary (Last 24 hours) at 07/13/2018 1104 Last data filed at 07/13/2018 0957 Gross per 24 hour  Intake 1122.11 ml  Output 400 ml  Net 722.11 ml     Wt Readings from Last 3 Encounters:  07/13/18 97 kg  06/24/18 96.4 kg  04/04/18 91.4 kg    Physical Exam  General: Alert and oriented x 3, NAD  Eyes:   HEENT:  Atraumatic, normocephalic  Cardiovascular: S1 S2 clear, RRR. No pedal edema b/l  Respiratory: CTAB, no wheezing, rales or rhonchi  Gastrointestinal: Soft, nontender, nondistended, NBS  Ext: no pedal edema bilaterally  Neuro: no new deficits  Musculoskeletal: No cyanosis, clubbing  Skin: No rashes  Psych: Normal affect and demeanor, alert and oriented x3     Data Reviewed:  I have personally reviewed following labs and imaging studies  Micro Results No results found for this or any previous visit (from the past 240 hour(s)).  Radiology Reports Ct Abdomen Pelvis W Contrast  Result Date: 07/10/2018 CLINICAL DATA:  Unexplained anemia EXAM: CT ABDOMEN AND PELVIS  WITH CONTRAST TECHNIQUE: Multidetector CT imaging of the abdomen and pelvis was performed using the standard protocol following bolus administration of intravenous contrast. CONTRAST:  172mL OMNIPAQUE IOHEXOL 300 MG/ML SOLN, additional oral enteric contrast COMPARISON:  None. FINDINGS: Lower chest: Small bilateral pleural effusions, pleural calcifications, and associated atelectasis or consolidation. Coronary artery calcifications. Hepatobiliary: No focal liver abnormality is seen. No gallstones, gallbladder wall thickening, or biliary dilatation. Pancreas: Unremarkable. No pancreatic ductal dilatation or surrounding inflammatory changes. Spleen: Normal in size without focal abnormality. Adrenals/Urinary Tract: 2.9 cm soft tissue attenuation nodule of the left adrenal gland (series 7, image 28). Kidneys are normal, without renal calculi, focal lesion, or hydronephrosis. Thickening of the urinary bladder, likely related to chronic outlet obstruction.  Stomach/Bowel: Stomach is within normal limits. Appendix appears normal. No evidence of bowel wall thickening, distention, or inflammatory changes. Sigmoid diverticulosis without evidence of acute diverticulitis. Vascular/Lymphatic: Mixed calcific atherosclerosis. No enlarged abdominal or pelvic lymph nodes. Reproductive: No mass or other abnormality. Other: No abdominal wall hernia or abnormality. No abdominopelvic ascites. Musculoskeletal: No acute or significant osseous findings. IMPRESSION: 1. No CT findings of the abdomen or pelvis to explain anemia. 2. There is a 2.9 cm soft tissue attenuation nodule in the left adrenal gland. Recommend further evaluation with adrenal protocol CT or MRI if clinically appropriate. This recommendation follows ACR consensus guidelines: Management of Incidental Adrenal Masses: A White Paper of the ACR Incidental Findings Committee. J Am Coll Radiol 2017;14:1038-1044. 3. Small bilateral pleural effusions with associated atelectasis or  consolidation, likely chronic with calcification of the pleura. 4. Other chronic and incidental findings as detailed above. Electronically Signed   By: Eddie Candle M.D.   On: 07/10/2018 19:21   Dg Chest Port 1 View  Result Date: 07/08/2018 CLINICAL DATA:  Shortness of breath EXAM: PORTABLE CHEST 1 VIEW COMPARISON:  March 31, 2018 FINDINGS: Heart is mildly enlarged with mild pulmonary venous hypertension. No edema or consolidation. No adenopathy. No bone lesions. IMPRESSION: Pulmonary vascular congestion without edema or consolidation. Electronically Signed   By: Lowella Grip III M.D.   On: 07/08/2018 13:15    Lab Data:  CBC: Recent Labs  Lab 07/09/18 0745 07/10/18 0339 07/11/18 0251 07/11/18 1809 07/12/18 0341 07/13/18 0331  WBC 9.4 8.4 8.1  --  7.6 7.1  HGB 7.8* 6.6* 7.9* 8.2* 8.3* 8.1*  HCT 25.3* 21.5* 26.1* 26.8* 26.1* 25.9*  MCV 88.8 90.3 90.0  --  89.1 90.6  PLT 263 212 196  --  208 474   Basic Metabolic Panel: Recent Labs  Lab 07/09/18 0745 07/10/18 0339 07/10/18 0849 07/11/18 0251 07/12/18 0341 07/13/18 0331  NA 136 136  --  131* 133* 136  K 4.0 4.2  --  4.2 4.2 4.0  CL 100 103  --  99 101 103  CO2 26 27  --  21* 24 25  GLUCOSE 136* 120*  --  113* 121* 97  BUN 20 19  --  18 18 14   CREATININE 0.91 0.94  --  0.86 0.82 0.75  CALCIUM 9.3 8.8*  --  8.7* 8.6* 8.5*  MG  --   --  2.2  --   --   --    GFR: Estimated Creatinine Clearance: 86 mL/min (by C-G formula based on SCr of 0.75 mg/dL). Liver Function Tests: Recent Labs  Lab 07/08/18 1245  AST 23  ALT 18  ALKPHOS 55  BILITOT 0.2*  PROT 6.1*  ALBUMIN 3.6   Recent Labs  Lab 07/08/18 1245  LIPASE 26   No results for input(s): AMMONIA in the last 168 hours. Coagulation Profile: Recent Labs  Lab 07/08/18 1245 07/09/18 0745 07/11/18 0251  INR 2.4* 1.9* 1.3*   Cardiac Enzymes: Recent Labs  Lab 07/08/18 1245 07/08/18 2344 07/09/18 0745 07/09/18 1143  TROPONINI 0.20* 60.09* 42.84* 25.48*    BNP (last 3 results) No results for input(s): PROBNP in the last 8760 hours. HbA1C: No results for input(s): HGBA1C in the last 72 hours. CBG: No results for input(s): GLUCAP in the last 168 hours. Lipid Profile: No results for input(s): CHOL, HDL, LDLCALC, TRIG, CHOLHDL, LDLDIRECT in the last 72 hours. Thyroid Function Tests: No results for input(s): TSH, T4TOTAL, FREET4, T3FREE, THYROIDAB in the last  72 hours. Anemia Panel: No results for input(s): VITAMINB12, FOLATE, FERRITIN, TIBC, IRON, RETICCTPCT in the last 72 hours. Urine analysis:    Component Value Date/Time   COLORURINE YELLOW 11/06/2017 0936   APPEARANCEUR HAZY (A) 11/06/2017 0936   LABSPEC 1.010 11/06/2017 0936   PHURINE 6.0 11/06/2017 0936   GLUCOSEU NEGATIVE 11/06/2017 0936   HGBUR SMALL (A) 11/06/2017 0936   BILIRUBINUR NEGATIVE 11/06/2017 0936   KETONESUR NEGATIVE 11/06/2017 0936   PROTEINUR NEGATIVE 11/06/2017 0936   NITRITE POSITIVE (A) 11/06/2017 0936   LEUKOCYTESUR LARGE (A) 11/06/2017 0936       M.D. [facility] 07/13/2018, 11:04 AM  Pager: [phone] Between 7am to 7pm - call Pager - [phone]  After 7pm go to [email or web address] - password [email or web address]  Call night coverage person covering after 7pm

## 2018-07-14 ENCOUNTER — Encounter (HOSPITAL_COMMUNITY): Payer: Self-pay | Admitting: Gastroenterology

## 2018-07-14 ENCOUNTER — Inpatient Hospital Stay (HOSPITAL_COMMUNITY)
Admission: EM | Disposition: A | Discharge status: Home / Self Care | Payer: Self-pay | Source: Home / Self Care | Attending: Internal Medicine

## 2018-07-14 ENCOUNTER — Telehealth: Payer: Self-pay | Admitting: Cardiology

## 2018-07-14 DIAGNOSIS — I2510 Atherosclerotic heart disease of native coronary artery without angina pectoris: Secondary | ICD-10-CM

## 2018-07-14 HISTORY — PX: LEFT HEART CATH AND CORONARY ANGIOGRAPHY: CATH118249

## 2018-07-14 LAB — CBC
HCT: 27.9 % — ABNORMAL LOW (ref 39.0–52.0)
Hemoglobin: 8.5 g/dL — ABNORMAL LOW (ref 13.0–17.0)
MCH: 27.9 pg (ref 26.0–34.0)
MCHC: 30.5 g/dL (ref 30.0–36.0)
MCV: 91.5 fL (ref 80.0–100.0)
Platelets: 232 10*3/uL (ref 150–400)
RBC: 3.05 MIL/uL — AB (ref 4.22–5.81)
RDW: 17.6 % — ABNORMAL HIGH (ref 11.5–15.5)
WBC: 7.3 10*3/uL (ref 4.0–10.5)
nRBC: 0.7 % — ABNORMAL HIGH (ref 0.0–0.2)

## 2018-07-14 LAB — BASIC METABOLIC PANEL
Anion gap: 6 (ref 5–15)
BUN: 14 mg/dL (ref 8–23)
CO2: 26 mmol/L (ref 22–32)
Calcium: 8.7 mg/dL — ABNORMAL LOW (ref 8.9–10.3)
Chloride: 102 mmol/L (ref 98–111)
Creatinine, Ser: 0.92 mg/dL (ref 0.61–1.24)
GFR calc Af Amer: >60 mL/min (ref >60)
GFR calc non Af Amer: >60 mL/min (ref >60)
Glucose, Bld: 151 mg/dL — ABNORMAL HIGH (ref 70–99)
Potassium: 3.9 mmol/L (ref 3.5–5.1)
Sodium: 134 mmol/L — ABNORMAL LOW (ref 135–145)

## 2018-07-14 SURGERY — LEFT HEART CATH AND CORONARY ANGIOGRAPHY
Anesthesia: LOCAL

## 2018-07-14 MED ORDER — LIDOCAINE HCL (PF) 1 % IJ SOLN
INTRAMUSCULAR | Status: DC | PRN
  Administered 2018-07-14: 2 mL via INTRADERMAL

## 2018-07-14 MED ORDER — MIDAZOLAM HCL 2 MG/2ML IJ SOLN
INTRAMUSCULAR | Status: DC | PRN
  Administered 2018-07-14 (×2): 0.5 mg via INTRAVENOUS

## 2018-07-14 MED ORDER — FENTANYL CITRATE (PF) 100 MCG/2ML IJ SOLN
INTRAMUSCULAR | Status: DC | PRN
  Administered 2018-07-14 (×2): 25 ug via INTRAVENOUS

## 2018-07-14 MED ORDER — FUROSEMIDE 80 MG PO TABS: 40.0000 mg | ORAL_TABLET | Freq: Every day | ORAL | 0 refills | Status: DC

## 2018-07-14 MED ORDER — HEPARIN (PORCINE) IN NACL 1000-0.9 UT/500ML-% IV SOLN
INTRAVENOUS | Status: DC | PRN
  Administered 2018-07-14 (×3): 500 mL

## 2018-07-14 MED ORDER — SODIUM CHLORIDE 0.9% FLUSH: 3.0000 mL | INTRAVENOUS | Status: DC | PRN

## 2018-07-14 MED ORDER — ISOSORBIDE MONONITRATE ER 30 MG PO TB24: 30.0000 mg | ORAL_TABLET | Freq: Every day | ORAL | Status: DC

## 2018-07-14 MED ORDER — SODIUM CHLORIDE 0.9% FLUSH: 3.0000 mL | Freq: Two times a day (BID) | INTRAVENOUS | Status: DC

## 2018-07-14 MED ORDER — METOPROLOL SUCCINATE ER 25 MG PO TB24: 25.0000 mg | ORAL_TABLET | Freq: Every day | ORAL | Status: DC

## 2018-07-14 MED ORDER — LIDOCAINE HCL (PF) 1 % IJ SOLN
INTRAMUSCULAR | Status: AC
  Filled 2018-07-14: qty 30

## 2018-07-14 MED ORDER — ISOSORBIDE MONONITRATE ER 30 MG PO TB24: 30.0000 mg | ORAL_TABLET | Freq: Every day | ORAL | 0 refills | Status: DC

## 2018-07-14 MED ORDER — NITROGLYCERIN 0.4 MG SL SUBL: 0.4000 mg | SUBLINGUAL_TABLET | SUBLINGUAL | 0 refills | Status: DC | PRN

## 2018-07-14 MED ORDER — OXYCODONE HCL 5 MG PO TABS: 5.0000 mg | ORAL_TABLET | ORAL | 0 refills | Status: DC | PRN

## 2018-07-14 MED ORDER — OXYCODONE HCL 5 MG PO TABS: 5.0000 mg | ORAL_TABLET | ORAL | Status: DC | PRN

## 2018-07-14 MED ORDER — HEPARIN (PORCINE) IN NACL 1000-0.9 UT/500ML-% IV SOLN
INTRAVENOUS | Status: AC
  Filled 2018-07-14: qty 500

## 2018-07-14 MED ORDER — ONDANSETRON HCL 4 MG/2ML IJ SOLN: 4.0000 mg | Freq: Four times a day (QID) | INTRAMUSCULAR | Status: DC | PRN

## 2018-07-14 MED ORDER — SODIUM CHLORIDE 0.9 % WEIGHT BASED INFUSION: 1.0000 mL/kg/h | INTRAVENOUS | Status: DC

## 2018-07-14 MED ORDER — HEPARIN SODIUM (PORCINE) 5000 UNIT/ML IJ SOLN: 5000.0000 [IU] | Freq: Three times a day (TID) | INTRAMUSCULAR | Status: DC

## 2018-07-14 MED ORDER — CLOPIDOGREL BISULFATE 75 MG PO TABS: 75.0000 mg | ORAL_TABLET | Freq: Every day | ORAL | 0 refills | Status: DC

## 2018-07-14 MED ORDER — MIDAZOLAM HCL 2 MG/2ML IJ SOLN
INTRAMUSCULAR | Status: AC
  Filled 2018-07-14: qty 2

## 2018-07-14 MED ORDER — ATORVASTATIN CALCIUM 80 MG PO TABS: 80.0000 mg | ORAL_TABLET | Freq: Every day | ORAL | 0 refills | Status: DC

## 2018-07-14 MED ORDER — PANTOPRAZOLE SODIUM 40 MG PO TBEC: 40.0000 mg | DELAYED_RELEASE_TABLET | Freq: Every day | ORAL | 0 refills | Status: DC

## 2018-07-14 MED ORDER — FENTANYL CITRATE (PF) 100 MCG/2ML IJ SOLN
INTRAMUSCULAR | Status: AC
  Filled 2018-07-14: qty 2

## 2018-07-14 MED ORDER — VERAPAMIL HCL 2.5 MG/ML IV SOLN
INTRAVENOUS | Status: AC
  Filled 2018-07-14: qty 2

## 2018-07-14 MED ORDER — HEPARIN SODIUM (PORCINE) 1000 UNIT/ML IJ SOLN
INTRAMUSCULAR | Status: AC
  Filled 2018-07-14: qty 1

## 2018-07-14 MED ORDER — SODIUM CHLORIDE 0.9 % IV SOLN: 250.0000 mL | INTRAVENOUS | Status: DC | PRN

## 2018-07-14 MED ORDER — HYDROCORTISONE ACETATE 25 MG RE SUPP
25.0000 mg | Freq: Every day | RECTAL | Status: DC
  Filled 2018-07-14: qty 1

## 2018-07-14 MED ORDER — ACETAMINOPHEN 325 MG PO TABS: 650.0000 mg | ORAL_TABLET | ORAL | Status: DC | PRN

## 2018-07-14 MED ORDER — HYDRALAZINE HCL 20 MG/ML IJ SOLN: 10.0000 mg | INTRAMUSCULAR | Status: AC | PRN

## 2018-07-14 MED ORDER — HEPARIN SODIUM (PORCINE) 1000 UNIT/ML IJ SOLN
INTRAMUSCULAR | Status: DC | PRN
  Administered 2018-07-14: 3500 [IU] via INTRAVENOUS

## 2018-07-14 MED ORDER — LABETALOL HCL 5 MG/ML IV SOLN: 10.0000 mg | INTRAVENOUS | Status: AC | PRN

## 2018-07-14 SURGICAL SUPPLY — 13 items
CATH 5FR JL3.5 JR4 ANG PIG MP (CATHETERS) ×2 IMPLANT
CATH LAUNCHER 5F EBU3.0 (CATHETERS) ×1 IMPLANT
CATH LAUNCHER 5F RADR (CATHETERS) ×1 IMPLANT
CATHETER LAUNCHER 5F EBU3.0 (CATHETERS) ×2
CATHETER LAUNCHER 5F RADR (CATHETERS) ×2
DEVICE RAD COMP TR BAND LRG (VASCULAR PRODUCTS) ×2 IMPLANT
GLIDESHEATH SLEND A-KIT 6F 22G (SHEATH) ×2 IMPLANT
GUIDEWIRE INQWIRE 1.5J.035X260 (WIRE) ×1 IMPLANT
INQWIRE 1.5J .035X260CM (WIRE) ×2
KIT HEART LEFT (KITS) ×2 IMPLANT
PACK CARDIAC CATHETERIZATION (CUSTOM PROCEDURE TRAY) ×2 IMPLANT
TRANSDUCER W/STOPCOCK (MISCELLANEOUS) ×2 IMPLANT
TUBING CIL FLEX 10 FLL-RA (TUBING) ×2 IMPLANT

## 2018-07-14 NOTE — Progress Notes (Addendum)
   [facility] will sign off.   Medication Recommendations: Would consider discharge home on home dose of Metoprolol 25 mg BID (recommend home Cardiologist change to Toprol), and Imdur 30 mg nightly. -- also would recommend d/c on Furosemide 40 mg - 80 mg alternating every other day. --Given change of GI recommendations, would consider restarting antiplatelet/anticoagulation after 1 week.  Start with Plavix, then reassess for possible bleeding (with hemoglobin check) after roughly 1 week.  Would probably hold off on rivaroxaban until seen in follow-up. Other recommendations (labs, testing, etc): Will need adequate outpatient hemoglobin checks with either PCP or outpatient cardiologist Follow up as an outpatient: We will assist with arranging follow-up appointment.   Glenetta Hew, MD

## 2018-07-14 NOTE — Telephone Encounter (Signed)
[facility] called requesting Televisit with Dr Harl Bowie  Discharging patient   [facility] visit  Thank you,  Colletta Maryland

## 2018-07-14 NOTE — Interval H&P Note (Signed)
Cath Lab Visit (complete for each Cath Lab visit)  Clinical Evaluation Leading to the Procedure:   ACS: Yes.    Non-ACS:    Anginal Classification: CCS III  Anti-ischemic medical therapy: Minimal Therapy (1 class of medications)  Non-Invasive Test Results: No non-invasive testing performed  Prior CABG: No previous CABG      History and Physical Interval Note:  07/14/2018 8:33 AM  Michael Stephens  has presented today for surgery, with the diagnosis of n stemi.  The various methods of treatment have been discussed with the patient and family. After consideration of risks, benefits and other options for treatment, the patient has consented to  Procedure(s): LEFT HEART CATH AND CORONARY ANGIOGRAPHY (N/A) as a surgical intervention.  The patient's history has been reviewed, patient examined, no change in status, stable for surgery.  I have reviewed the patient's chart and labs.  Questions were answered to the patient's satisfaction.     [name]

## 2018-07-14 NOTE — CV Procedure (Addendum)
   Coronary angiography with left heart hemodynamic recordings performed via right radial approach using real-time vascular ultrasound for arterial access.  Subclavian/innominate artery/aortic significant tortuosity produced difficulty with catheter control and made selective engagement of the right coronary difficult.  Left main is widely patent.  Segmental proximal to mid 70% LAD.  3 small to moderate size diagonal branches without high-grade obstruction.  Second and third obtuse marginals are totally occluded.  The third obtuse marginal is large and wraps around the left ventricular apex.  The occlusion in the third obtuse marginal appears relatively recent.  Dominant RCA with proximal to mid 85% stenosis.  LVEDP is mildly elevated.  No transaortic valve gradient.  Recommend aggressive secondary risk factor modification.  Anti-ischemic therapy with beta-blocker and long-acting nitrates as tolerated by blood pressure.  Combination antiplatelet and antithrombotic therapy will place the patient at increased risk for recurrent bleeding.  Would avoid aspirin.  Perhaps low-dose rivaroxaban 2.5 mg twice daily and Plavix 75 mg/day would be his best option (based on the [facility] trial).  [facility] used aspirin, but this patient's endoscopy has suggested aspirin will be worse than Plavix as far as bleeding risk is concerned.  Ultimately will need revascularization when we are sure he is stable from GI bleeding standpoint.  The most prudent revascularization approach may well be coronary artery bypass grafting rather than multiple stents with requirement for high intensity antiplatelet therapy in addition to anticoagulation therapy.  Needs clinical follow-up and coordination between cardiology and GI over the next 4 weeks.

## 2018-07-14 NOTE — Discharge Summary (Signed)
Michael Stephens, is a 84 y.o. male  DOB 04-26-1934  MRN [id number].  Admission date:  07/08/2018  Admitting Physician  Kathie Dike, MD  Discharge Date:  07/14/2018   Primary MD  [facility]  Recommendations for primary care physician for things to follow:   Needs repeat CBC and BMP within 1 week.  Discharge Diagnosis  Principal Problem:   NSTEMI (non-ST elevated myocardial infarction) [facility]) Active Problems:   Paroxysmal atrial flutter (HCC)   Essential hypertension   Anemia   Coagulopathy (HCC)   Chronic diastolic CHF (congestive heart failure) ([place])   Upper GI bleed      Past Medical History:  Diagnosis Date  . Acute diastolic CHF (congestive heart failure) ([place]) 02/11/2017  . Atrial flutter ([place])    a. diagnosed in 02/2017. Rate-control strategy pursued.   Marland Kitchen BPH (benign prostatic hyperplasia)   . Chronic diastolic heart failure ([place])   . Hypertension   . Renal disorder    cyst on kidney    Past Surgical History:  Procedure Laterality Date  . ABCESS DRAINAGE  11/2008   BUTTOCKS  . BIOPSY  07/13/2018   Procedure: BIOPSY;  Surgeon: Otis Brace, MD;  Location: [facility];  Service: Gastroenterology;;  . CATARACT EXTRACTION, BILATERAL    . COLONOSCOPY N/A 04/02/2017   Procedure: COLONOSCOPY;  Surgeon: Rogene Houston, MD;  [facility]: [facility];  Service: Endoscopy;  Laterality: N/A;  10:55  . COLONOSCOPY WITH PROPOFOL N/A 07/13/2018   Procedure: COLONOSCOPY WITH PROPOFOL;  Surgeon: Otis Brace, MD;  Location: [place];  Service: Gastroenterology;  Laterality: N/A;  . ESOPHAGOGASTRODUODENOSCOPY (EGD) WITH PROPOFOL N/A 07/09/2018   Procedure: ESOPHAGOGASTRODUODENOSCOPY (EGD) WITH PROPOFOL;  Surgeon: Wilford Corner, MD;  Location: [place];  Service: Endoscopy;  Laterality:  N/A;  . POLYPECTOMY  04/02/2017   Procedure: POLYPECTOMY;  Surgeon: Rogene Houston, MD;  [facility];  Service: Endoscopy;;  asceding colon(hot snare)/ sigmoid colon times two (cold snare)  . POLYPECTOMY  07/13/2018   Procedure: POLYPECTOMY;  Surgeon: Otis Brace, MD;  Location: [facility];  Service: Gastroenterology;;       HPI  from the history and physical done on the day of admission:     Michael Stephens is a 84 y.o. male with medical history significant of coronary artery disease, atrial flutter on anticoagulation, chronic diastolic heart failure, BPH, presents to the hospital with complaints of epigastric discomfort and left arm pain.  Patient is a poor historian and is not clear on timelines.  He reports that this morning he noted left-sided arm pain which caused him to come to the hospital.  He describes epigastric discomfort which radiates down his abdomen.  This is been occurring for several days to weeks.  This usually worsens after he eats or drinks something.  Is associated with dyspepsia.  He has had nausea, but no vomiting.  He also describes having dark stools, but attributes that to taking Pepto-Bismol.  He has not noticed any frank hematochezia.  He has noted  shortness of breath on exertion as well as dizziness for the past several days to weeks.  He denies taking any NSAIDs.  He has not had any fever or cough.  ED Course: In the emergency room, EKG showed diffuse ST depressions.  He had mildly elevated troponin of 0.2.  Further work-up including CBC showed a hemoglobin of 5.5 with a baseline hemoglobin around 9.  Stools were noted to be Hemoccult positive.  He had mild elevation of BUN at 30.  INR was noted to be elevated above 2.  Cardiology was consulted who recommended patient be transferred to [facility] since he may need multidisciplinary care.   Hospital Course:    NSTEMI (non-ST elevated myocardial infarction) [facility]) presenting with abdominal  pain and chest pain -Troponin initial 0.2 -> 60.09-> 42.8.  No chest pain currently. -Cardiology following, but thought not to be a good candidate for antiplatelet or anticoagulation acutely due to severe anemia -2D echo-> severe hypokinesis of the left ventricular, entire inferolateral wall, EF 40-45%  -Left heart cath  performed by Dr. Daneen Schick on 4/1 revealed significant three-vessel coronary artery disease.  See full report below. -Continue beta-blocker at home dose of 25 mg twice daily -Dr. Ellyn Hack of cardiology recommend adding 30 mg of Imdur nightly, alternating furosemide 40 mg and 80 mg every day, and to start Plavix in 1 week if no signs of bleeding/hemoglobin at least 9 g/dL.  Will possibly add on Xarelto 2.5mg  twice daily a later date and time, if able based off the [facility] trial. -Cardiology to set up outpatient follow-up  Active Problems:  Upper GI bleed with acute on chronic blood loss anemia -Patient reported epigastric discomfort with dyspepsia, melanotic stools for weeks -Hemoglobin 5.5 on admission,received 4 units packed RBCs so far, hemoglobin 8.3 today -EGD on 3/27 showed minimal gastritis, mild duodenitis and mild distal esophagitis, no bleeding or ulcers.  -GI following, CT abdomen pelvis negative. -Colonoscopy 3/31 showed perianal skin tags with hemorrhoids medium-sized lipoma at the hepatic flexure, diverticulosis in the left colon -Patient recommended to call and set up follow-up with gastroenterologist in [place] in 3 to 4 weeks   Paroxysmal A. fib ([place]) with RVR -Currently not a candidate for anticoagulation given GI bleed and severe anemia -Currently in NSR, rate controlled, continue metoprolol  -Coumadin held due to GI bleed, cardiology recommended to stay off of anticoagulation and antiplatelet agents for at least 1 month.    Essential hypertension -Stable, continue beta-blocker  History of chronic diastolic CHF 2D echo 40/98 had shown EF of 55 to  60%, restrictive physiology with decreased left ventricular diastolic compliance. - Patient is on Lasix 80 mg qdaily at home, has received multiple packed RBC transfusions, restarted low-dose Lasix 40 mg daily on the hospital -Follow strict I's and O's -Cardiology recommending alternating between 80 mg and 40 mg every other day until able to follow-up       Follow UP  Follow-up Information    Branch, Alphonse Guild, MD Follow up.   Specialty:  Cardiology Why:  Office will call to setup for a tele-health transitition of care appt.  Contact information: [address] [place] [address] [phone]        [facility]. Schedule an appointment as soon as possible for a visit in 1 week(s).   Contact information: [place] [address] [phone]            Consults obtained: Cardiology, gastroenterology  Discharge Condition: Stable  Diet and Activity recommendation: See Discharge Instructions below  Discharge Instructions    Discharge instructions   Complete by:  As directed    Follow with Primary MD Practice[facility] in 7 days.  Please call and set up this appointment.  Cardiology is going to call and set up follow-up.  If you have no signs of bleeding they want you to start on Plavix on 07/21/2018.  Other changes include starting on Imdur 30 mg orally nightly, alternating Lasix 40 mg and 80 mg every other day, and atorvastatin 80 mg daily.  All other medications changes have been sent to your pharmacy.  Please also call and set up appointment to follow-up with your gastroenterologist in [place] within 3 to 4 weeks.   Get CBC and BMP  -  checked  by Primary MD in 5-7 days ( we routinely change or add medications that can affect your baseline labs and fluid status, therefore we recommend that you get the mentioned basic workup next visit with your PCP, your PCP may decide not to get them or add new tests based on their clinical  decision)  Activity: Light activity as tolerated.  Disposition Home   Diet: Heart Healthy    For Heart failure patients - Check your Weight same time everyday, if you gain over 2 pounds, or you develop in leg swelling, experience more shortness of breath or chest pain, call your Primary doctor immediately. Follow Cardiac Low Salt Diet and 1.5 lit/day fluid restriction.  Special Instructions: If you have smoked or chewed Tobacco  in the last 2 yrs please stop smoking, stop any regular Alcohol  and or any Recreational drug use.  On your next visit with your primary care physician please Get Medicines reviewed and adjusted.  Please request your Practice, [facility] to go over all Hospital Tests and Procedure/Radiological results at the follow up, please get all Hospital records sent to your Prim MD by signing hospital release before you go home.  If you experience worsening of your admission symptoms, develop shortness of breath, life threatening emergency, suicidal or homicidal thoughts you must seek medical attention immediately by calling 911 or calling your MD immediately  if symptoms less severe.  You Must read complete instructions/literature along with all the possible adverse reactions/side effects for all the Medicines you take and that have been prescribed to you. Take any new Medicines after you have completely understood and accpet all the possible adverse reactions/side effects.   Do not drive, operate heavy machinery, perform activities at heights, swimming or participation in water activities or provide baby sitting services if your were admitted for syncope or siezures until you have seen by Primary MD or a Neurologist and advised to do so again.  Do not drive when taking Pain medications.  Do not take more than prescribed Pain, Sleep and Anxiety Medications  Wear Seat belts while driving.   Please note  You were cared for by a hospitalist during your hospital stay. If  you have any questions about your discharge medications or the care you received while you were in the hospital after you are discharged, you can call the unit and asked to speak with the hospitalist on call if the hospitalist that took care of you is not available. Once you are discharged, your primary care physician will handle any further medical issues. Please note that NO REFILLS for any discharge medications will be authorized once you are discharged, as it is imperative that you return  to your primary care physician (or establish a relationship with a primary care physician if you do not have one) for your aftercare needs so that they can reassess your need for medications and monitor your lab values.   Increase activity slowly   Complete by:  As directed         Discharge Medications     Allergies as of 07/14/2018      Reactions   Penicillins    Has patient had a PCN reaction causing immediate rash, facial/tongue/throat swelling, SOB or lightheadedness with hypotension: Yes Has patient had a PCN reaction causing severe rash involving mucus membranes or skin necrosis: No Has patient had a PCN reaction that required hospitalization: No Has patient had a PCN reaction occurring within the last 10 years: No If all of the above answers are "NO", then may proceed with Cephalosporin use.      Medication List    STOP taking these medications   famotidine 40 MG tablet Commonly known as:  PEPCID   warfarin 5 MG tablet Commonly known as:  COUMADIN     TAKE these medications   atorvastatin 80 MG tablet Commonly known as:  LIPITOR Take 1 tablet (80 mg total) by mouth daily at 6 PM.   budesonide-formoterol 160-4.5 MCG/ACT inhaler Commonly known as:  SYMBICORT Inhale 2 puffs into the lungs 2 (two) times daily.   clopidogrel 75 MG tablet Commonly known as:  Plavix Take 1 tablet (75 mg total) by mouth daily. Start taking on:  July 21, 2018   finasteride 5 MG tablet Commonly known as:   PROSCAR Take 5 mg by mouth daily.   furosemide 80 MG tablet Commonly known as:  LASIX Take 0.5-1 tablets (40-80 mg total) by mouth daily. Take 40 mg p.o. daily on Monday, Wednesday, Friday, and Sunday.  Take Lasix 80 mg p.o. daily on Tuesday, Thursday, and Saturday What changed:    how much to take  additional instructions   isosorbide mononitrate 30 MG 24 hr tablet Commonly known as:  IMDUR Take 1 tablet (30 mg total) by mouth at bedtime.   losartan 25 MG tablet Commonly known as:  COZAAR Take 1 tablet (25 mg total) by mouth daily.   metoprolol tartrate 25 MG tablet Commonly known as:  LOPRESSOR Take 1 tablet (25 mg total) by mouth 2 (two) times daily.   nitroGLYCERIN 0.4 MG SL tablet Commonly known as:  NITROSTAT Place 1 tablet (0.4 mg total) under the tongue every 5 (five) minutes as needed for chest pain (CP or SOB).   oxyCODONE 5 MG immediate release tablet Commonly known as:  Oxy IR/ROXICODONE Take 1 tablet (5 mg total) by mouth every 4 (four) hours as needed for moderate pain.   pantoprazole 40 MG tablet Commonly known as:  PROTONIX Take 1 tablet (40 mg total) by mouth daily. Start taking on:  July 15, 2018   potassium chloride SA 20 MEQ tablet Commonly known as:  K-DUR,KLOR-CON Take 1 tablet (20 mEq total) by mouth daily.   tamsulosin 0.4 MG Caps capsule Commonly known as:  FLOMAX Take 0.4 mg by mouth daily.   vitamin B-12 500 MCG tablet Commonly known as:  CYANOCOBALAMIN Take 1 tablet (500 mcg total) by mouth daily.       Major procedures and Radiology Reports - PLEASE review detailed and final reports for all details, in brief -    Left heart catheterization 07/14/2018:  Significant three-vessel coronary artery disease.  Segmental 70% proximal to mid  LAD.  This is an intermediate stenosis but would likely have positive FFR if tested.  Total occlusion of the second and third obtuse marginal branches, the sources of the patient's recent non-ST  elevation myocardial infarction.  The LAD supplies collaterals to the third obtuse marginal which is a large of the 3 obtuse marginals.  85% proximal RCA.  Right dominant coronary anatomy.  No aortic valve gradient  Mildly elevated LVEDP  RECOMMENDATIONS:   Combination antiplatelet and antithrombotic therapy will place the patient at increased risk for recurrent bleeding. Requires combination therapy because of paroxysmal atrial flutter and significant underlying ischemic heart disease.  Would not recommend either until hemoglobin is stable and above 9 for at least 2 weeks.  Would avoid aspirin.  Perhaps low-dose rivaroxaban 2.5 mg twice daily and Plavix 75 mg/day would be his best option (based on the [facility] trial).  [facility] used aspirin, but this patient's endoscopy has suggested aspirin will be worse than Plavix as far as bleeding risk is concerned.  Low-dose rivaroxaban and Plavix has never been started as a stroke preventative measure.  No good data in this particular clinical subset.  Anti-ischemic therapy with beta-blocker and long-acting nitrates as tolerated by blood pressure.  Aggressive secondary risk factor modification including high intensity statin therapy.  Will need clinical cardiology and gastroenterology follow-up to coordinate care.  Cardiology follow-up should be within the next 4 weeks.  Hemoglobin in 2 and 4 weeks.  Ct Abdomen Pelvis W Contrast  Result Date: 07/10/2018 CLINICAL DATA:  Unexplained anemia EXAM: CT ABDOMEN AND PELVIS WITH CONTRAST TECHNIQUE: Multidetector CT imaging of the abdomen and pelvis was performed using the standard protocol following bolus administration of intravenous contrast. CONTRAST:  150mL OMNIPAQUE IOHEXOL 300 MG/ML SOLN, additional oral enteric contrast COMPARISON:  None. FINDINGS: Lower chest: Small bilateral pleural effusions, pleural calcifications, and associated atelectasis or consolidation. Coronary artery calcifications.  Hepatobiliary: No focal liver abnormality is seen. No gallstones, gallbladder wall thickening, or biliary dilatation. Pancreas: Unremarkable. No pancreatic ductal dilatation or surrounding inflammatory changes. Spleen: Normal in size without focal abnormality. Adrenals/Urinary Tract: 2.9 cm soft tissue attenuation nodule of the left adrenal gland (series 7, image 28). Kidneys are normal, without renal calculi, focal lesion, or hydronephrosis. Thickening of the urinary bladder, likely related to chronic outlet obstruction. Stomach/Bowel: Stomach is within normal limits. Appendix appears normal. No evidence of bowel wall thickening, distention, or inflammatory changes. Sigmoid diverticulosis without evidence of acute diverticulitis. Vascular/Lymphatic: Mixed calcific atherosclerosis. No enlarged abdominal or pelvic lymph nodes. Reproductive: No mass or other abnormality. Other: No abdominal wall hernia or abnormality. No abdominopelvic ascites. Musculoskeletal: No acute or significant osseous findings. IMPRESSION: 1. No CT findings of the abdomen or pelvis to explain anemia. 2. There is a 2.9 cm soft tissue attenuation nodule in the left adrenal gland. Recommend further evaluation with adrenal protocol CT or MRI if clinically appropriate. This recommendation follows ACR consensus guidelines: Management of Incidental Adrenal Masses: A White Paper of the ACR Incidental Findings Committee. J Am Coll Radiol 2017;14:1038-1044. 3. Small bilateral pleural effusions with associated atelectasis or consolidation, likely chronic with calcification of the pleura. 4. Other chronic and incidental findings as detailed above. Electronically Signed   By: Eddie Candle M.D.   On: 07/10/2018 19:21   Dg Chest Port 1 View  Result Date: 07/08/2018 CLINICAL DATA:  Shortness of breath EXAM: PORTABLE CHEST 1 VIEW COMPARISON:  March 31, 2018 FINDINGS: Heart is mildly enlarged with mild pulmonary venous hypertension. No edema or  consolidation. No adenopathy. No bone lesions. IMPRESSION: Pulmonary vascular congestion without edema or consolidation. Electronically Signed   By: Lowella Grip III M.D.   On: 07/08/2018 13:15    Micro Results    No results found for this or any previous visit (from the past 240 hour(s)).     Today   Subjective    Casmere Hollenbeck today states that if there is nothing else that were going to do for him here in the hospital he is ready to go home.   Objective   Blood pressure 123/62, pulse 74, temperature 98.6 F (37 C), temperature source Oral, resp. Rate 20, height 6' (1.829 m), weight 96.6 kg, SpO2 90 %.   Intake/Output Summary (Last 24 hours) at 07/14/2018 1624 Last data filed at 07/14/2018 1100 Gross per 24 hour  Intake 1452 ml  Output 50 ml  Net 1402 ml    Exam  Constitutional: Elderly  male in NAD, calm, comfortable Eyes: PERRL, lids and conjunctivae normal ENMT: Mucous membranes are moist. Posterior pharynx clear of any exudate or lesions.  Neck: normal, supple, no masses, no thyromegaly Respiratory: clear to auscultation bilaterally, no wheezing, no crackles. Normal respiratory effort. No accessory muscle use.  Cardiovascular: Regular rate and rhythm, no murmurs / rubs / gallops.  1+ pitting lower  extremity edema. 2+ pedal pulses. No carotid bruits.  Abdomen: no tenderness, no masses palpated. No hepatosplenomegaly. Bowel sounds positive.  Musculoskeletal: no clubbing / cyanosis. No joint deformity upper and lower extremities. Good ROM, no contractures. Normal muscle tone.  Skin: no rashes, lesions, ulcers. No induration Neurologic: CN 2-12 grossly intact. Sensation intact, DTR normal. Strength 5/5 in all 4.  Psychiatric: Normal judgment and insight. Alert and oriented x 3. Normal mood.    Data Review   CBC w Diff:  Lab Results  Component Value Date   WBC 7.3 07/14/2018   HGB 8.5 (L) 07/14/2018   HCT 27.9 (L) 07/14/2018   PLT 232 07/14/2018   LYMPHOPCT 9  03/31/2018   MONOPCT 13 03/31/2018   EOSPCT 1 03/31/2018   BASOPCT 0 03/31/2018    CMP:  Lab Results  Component Value Date   NA 134 (L) 07/14/2018   K 3.9 07/14/2018   CL 102 07/14/2018   CO2 26 07/14/2018   BUN 14 07/14/2018   CREATININE 0.92 07/14/2018   PROT 6.1 (L) 07/08/2018   ALBUMIN 3.6 07/08/2018   BILITOT 0.2 (L) 07/08/2018   ALKPHOS 55 07/08/2018   AST 23 07/08/2018   ALT 18 07/08/2018  .   Total Time in preparing paper work, data evaluation and todays exam - 35 minutes  Norval Morton M.D on 07/14/2018 at 4:24 PM  [facility]   Office  [phone]

## 2018-07-14 NOTE — Progress Notes (Signed)
SATURATION QUALIFICATIONS: (This note is used to comply with regulatory documentation for home oxygen)  Patient Saturations on Room Air at Rest = 98%  Patient Saturations on Room Air while Ambulating = 92%  Patient Saturations on 0 Liters of oxygen while Ambulating = 92 %  Please briefly explain why patient needs home oxygen: Pt does not need home O2

## 2018-07-14 NOTE — Progress Notes (Signed)
[facility] Gastroenterology Progress Note  Michael Stephens 84 y.o. Jul 17, 1934  CC: Anemia, occult blood positive stool   Subjective: No acute GI issues.  Denies abdominal pain.  Denies blood in the stool or black stool.  ROS : Positive for mild dyspnea negative for acute chest pain.  Positive for leg cramps.   Objective: Vital signs in last 24 hours: Vitals:   07/13/18 2102 07/14/18 0401  BP:  133/74  Pulse: 78 69  Resp: 14   Temp:  97.9 F (36.6 C)  SpO2: 98% (!) 88%    Physical Exam:  General.  Well developed.  Not in acute distress ABD.  Soft, nontender, nondistended, bowel sounds present.  No peritoneal signs Neuro.  Alert/oriented x3 PSych.  Mood and affect normal  Lab Results: Recent Labs    07/13/18 0331 07/14/18 0344  NA 136 134*  K 4.0 3.9  CL 103 102  CO2 25 26  GLUCOSE 97 151*  BUN 14 14  CREATININE 0.75 0.92  CALCIUM 8.5* 8.7*   No results for input(s): AST, ALT, ALKPHOS, BILITOT, PROT, ALBUMIN in the last 72 hours. Recent Labs    07/13/18 0331 07/14/18 0344  WBC 7.1 7.3  HGB 8.1* 8.5*  HCT 25.9* 27.9*  MCV 90.6 91.5  PLT 220 232   No results for input(s): LABPROT, INR in the last 72 hours.    Assessment/Plan: -Anemia with occult blood positive stool. EGD 03/27 showed mild gastritis, duodenitis and mild esophagitis. - NSTEMI./ acute coronary syndrome.  Heart cath this morning showed multivessel disease and cardiology has recommended CABG. -H/O  atrial fibrillation.  Recommendations -------------------------  -Colonoscopy showed few colon polyps which was removed with cold snare and biopsy forcep, medium sized lipoma at hepatic  flexure, diverticulosis and large internal hemorrhoids.  -No evidence of active bleeding in the colon as well as in the terminal ileum.  -Based on cardiology notes, looks like patient is in need for antiplatelet and antithrombotic therapy.    Recommend to start with Plavix for 1 week and add rivaroxaban if no  evidence of bleeding or drop in hemoglobin after 1 week of being on Plavix.  -No further inpatient GI work-up planned.  GI will sign off.  He was advised to follow-up with his primary GI in [place] in 3 to 4 weeks.   Otis Brace MD, [place] 07/14/2018, 11:56 AM  Contact #  [phone]

## 2018-07-15 ENCOUNTER — Encounter (HOSPITAL_COMMUNITY): Payer: Self-pay | Admitting: Interventional Cardiology

## 2018-07-15 MED FILL — Verapamil HCl IV Soln 2.5 MG/ML: INTRAVENOUS | Qty: 2 | Status: AC

## 2018-07-16 ENCOUNTER — Encounter: Payer: Self-pay | Admitting: *Deleted

## 2018-07-16 NOTE — Telephone Encounter (Signed)
Pt verbally consented to telehealth telephone appt with Dr Harl Bowie on 07/21/18 and that insurance would be billed for encounter. Pt doesn't have a way to check BP at home but does have a scale. Reviewed pt medications/allergies/pharmacy. Pt stopped atorvastatin due to severe leg aches (added to allergies) wants to discuss with Dr Harl Bowie at appt if he needed another cholesterol medication in place of this that wouldn't cause the muscle pain

## 2018-07-21 ENCOUNTER — Telehealth: Payer: Medicare PPO | Admitting: Cardiology

## 2018-07-21 ENCOUNTER — Telehealth (INDEPENDENT_AMBULATORY_CARE_PROVIDER_SITE_OTHER): Payer: Medicare PPO | Admitting: Cardiology

## 2018-07-21 ENCOUNTER — Encounter: Payer: Self-pay | Admitting: Cardiology

## 2018-07-21 ENCOUNTER — Telehealth: Payer: Self-pay | Admitting: Cardiology

## 2018-07-21 ENCOUNTER — Other Ambulatory Visit: Payer: Self-pay

## 2018-07-21 VITALS — Ht 72.0 in | Wt 210.0 lb

## 2018-07-21 DIAGNOSIS — D500 Iron deficiency anemia secondary to blood loss (chronic): Secondary | ICD-10-CM

## 2018-07-21 DIAGNOSIS — I2510 Atherosclerotic heart disease of native coronary artery without angina pectoris: Secondary | ICD-10-CM

## 2018-07-21 DIAGNOSIS — I4891 Unspecified atrial fibrillation: Secondary | ICD-10-CM | POA: Diagnosis not present

## 2018-07-21 MED ORDER — ROSUVASTATIN CALCIUM 5 MG PO TABS: 5.0000 mg | ORAL_TABLET | ORAL | 1 refills | Status: DC

## 2018-07-21 MED ORDER — METOPROLOL SUCCINATE ER 25 MG PO TB24: 25.0000 mg | ORAL_TABLET | Freq: Every day | ORAL | 1 refills | Status: DC

## 2018-07-21 MED ORDER — RIVAROXABAN 20 MG PO TABS: 20.0000 mg | ORAL_TABLET | Freq: Every day | ORAL | 3 refills | Status: DC

## 2018-07-21 NOTE — Progress Notes (Signed)
Virtual Visit via Telephone Note   This visit type was conducted due to national recommendations for restrictions regarding the COVID-19 Pandemic (e.g. social distancing) in an effort to limit this patient's exposure and mitigate transmission in our community.  Due to his co-morbid illnesses, this patient is at least at moderate risk for complications without adequate follow up.  This format is felt to be most appropriate for this patient at this time.  The patient did not have access to video technology/had technical difficulties with video requiring transitioning to audio format only (telephone).  All issues noted in this document were discussed and addressed.  No physical exam could be performed with this format.  Please refer to the patient's chart for his  consent to telehealth for [facility].   Evaluation Performed:  Follow-up visit  Date:  07/21/2018   ID:  Michael Stephens, DOB 09/14/1934, MRN [id number]  Patient Location: Home  Provider Location: Office  PCP:  [facility]  Cardiologist:  Carlyle Dolly, MD  Electrophysiologist:  None   Chief Complaint:  Hospital follow up  History of Present Illness:    Michael Stephens is a 84 y.o. male who presents via audio/video conferencing for a telehealth visit today.    1. CAD/ICM - abnormal stress test 2018 intermediate risk managed medcally  - admitted 06/2018 with epgiastric and chest pain, somewhat atypical chest pain symptoms - significant EKG changes, aVR ST elevation and diffuse ST depression suggesting LM or multivessel disease. Eventual severe troponin elevation peak 60 - management complicated by severe anemia, Hgb 5.5   - eventual cath 07/2018 showed ostial LAD 70%, OM2 100%, , OM3 100%, RCA 85%. Cultprit thought to be occluded OM2 and OM3. OM3 has colalterals from LAD. Managed medically due to GI bleed - 06/2018 echo LVEF 80-99%, grade I diastolic dysfunction, PASP 64. Aortic root 40 mm - LVEF in 03/2018 was  55-60%   - GI recs were to start plavix, and add xarelto if no recurrent bleeding after 1 week on plavix.  - he reprots he had labs with pcp yesterday, told his cbc looked good  - side effects on lipitor muscle aches in the past. Also symptoms on pravastatin. He has not started the atorvastatin prescribed at discharge.  - no recent epigastric pain/chest pain since discharge.     2. Atrial arrythmias history of atrial arrythmias including afib/aflutter/MAT - off anticoag due to recent GI bleed    3. Anemia/GI bleed - EGD 03/27 showed mild gastritis, duodenitis and mild esophagitis. - Colonoscopy showed few colon polyps which was removed with cold snare and biopsy forcep, medium sized lipoma at hepatic  flexure, diverticulosis and large internal hemorrhoids.     The patient does not have symptoms concerning for COVID-19 infection (fever, chills, cough, or new shortness of breath).     Past Medical History:  Diagnosis Date  . Acute diastolic CHF (congestive heart failure) ([place]) 02/11/2017  . Atrial flutter ([place])    a. diagnosed in 02/2017. Rate-control strategy pursued.   Marland Kitchen BPH (benign prostatic hyperplasia)   . Chronic diastolic heart failure ([place])   . Hypertension   . Renal disorder    cyst on kidney   Past Surgical History:  Procedure Laterality Date  . ABCESS DRAINAGE  11/2008   BUTTOCKS  . BIOPSY  07/13/2018   Procedure: BIOPSY;  Surgeon: Otis Brace, MD;  Location: [facility];  Service: Gastroenterology;;  . CATARACT EXTRACTION, BILATERAL    . COLONOSCOPY N/A 04/02/2017  Procedure: COLONOSCOPY;  Surgeon: Rogene Houston, MD;  Location: [facility];  Service: Endoscopy;  Laterality: N/A;  10:55  . COLONOSCOPY WITH PROPOFOL N/A 07/13/2018   Procedure: COLONOSCOPY WITH PROPOFOL;  Surgeon: Otis Brace, MD;  Location: [place];  Service: Gastroenterology;  Laterality: N/A;  . ESOPHAGOGASTRODUODENOSCOPY (EGD) WITH PROPOFOL N/A 07/09/2018   Procedure:  ESOPHAGOGASTRODUODENOSCOPY (EGD) WITH PROPOFOL;  Surgeon: Wilford Corner, MD;  Location: [place];  Service: Endoscopy;  Laterality: N/A;  . LEFT HEART CATH AND CORONARY ANGIOGRAPHY N/A 07/14/2018   Procedure: LEFT HEART CATH AND CORONARY ANGIOGRAPHY;  Surgeon: Belva Crome, MD;  Location: [place] [facility];  Service: Cardiovascular;  Laterality: N/A;  . POLYPECTOMY  04/02/2017   Procedure: POLYPECTOMY;  Surgeon: Rogene Houston, MD;  Location: [facility];  Service: Endoscopy;;  asceding colon(hot snare)/ sigmoid colon times two (cold snare)  . POLYPECTOMY  07/13/2018   Procedure: POLYPECTOMY;  Surgeon: Otis Brace, MD;  Location: [facility] ENDOSCOPY;  Service: Gastroenterology;;     No outpatient medications have been marked as taking for the 07/21/18 encounter (Appointment) with Arnoldo Lenis, MD.     Allergies:   Atorvastatin and Penicillins   Social History   Tobacco Use  . Smoking status: Former Smoker    Types: Cigarettes    Last attempt to quit: 2006    Years since quitting: 14.2  . Smokeless tobacco: Never Used  Substance Use Topics  . Alcohol use: No  . Drug use: No     Family Hx: The patient's family history includes CAD in his father; CVA in his father; Diabetes in his sister. There is no history of Colon cancer.  ROS:   Please see the history of present illness.    All other systems reviewed and are negative.   Prior CV studies:   The following studies were reviewed today:  06/2018 echo IMPRESSIONS    1. Severe hypokinesis of the left ventricular, entire inferolateral wall.  2. The left ventricle has mild-moderately reduced systolic function, with an ejection fraction of 40-45%. The cavity size was normal. There is mild asymmetric left ventricular hypertrophy. Left ventricular diastolic Doppler parameters are consistent  with impaired relaxation.  3. The right ventricle has normal systolc function. The cavity was normal. There is no increase in  right ventricular wall thickness. Right ventricular systolic pressure is moderately elevated with an estimated pressure of 63.7 mmHg.  4. Left atrial size was moderately dilated.  5. Right atrial size was moderately dilated.  6. The mitral valve is myxomatous. Mild thickening of the mitral valve leaflet.  7. The aortic valve is tricuspid Mild thickening of the aortic valve Mild calcification of the aortic valve. Aortic valve regurgitation is trivial by color flow Doppler.  8. There is mild dilatation of the aortic root measuring 40 mm.  9. The inferior vena cava was dilated in size with >50% respiratory variability. 10. No intracardiac thrombi or masses were visualized.   07/2018 cath  Significant three-vessel coronary artery disease.  Segmental 70% proximal to mid LAD.  This is an intermediate stenosis but would likely have positive FFR if tested.  Total occlusion of the second and third obtuse marginal branches, the sources of the patient's recent non-ST elevation myocardial infarction.  The LAD supplies collaterals to the third obtuse marginal which is a large of the 3 obtuse marginals.  85% proximal RCA.  Right dominant coronary anatomy.  No aortic valve gradient  Mildly elevated LVEDP  RECOMMENDATIONS:  Combination antiplatelet and antithrombotic therapy will place the patient at increased risk for recurrent bleeding. Requires combination therapy because of paroxysmal atrial flutter and significant underlying ischemic heart disease.  Would not recommend either until hemoglobin is stable and above 9 for at least 2 weeks.  Would avoid aspirin.  Perhaps low-dose rivaroxaban 2.5 mg twice daily and Plavix 75 mg/day would be his best option (based on the [facility] trial).  [facility] used aspirin, but this patient's endoscopy has suggested aspirin will be worse than Plavix as far as bleeding risk is concerned.  Low-dose rivaroxaban and Plavix has never been started as a stroke preventative  measure.  No good data in this particular clinical subset.  Anti-ischemic therapy with beta-blocker and long-acting nitrates as tolerated by blood pressure.  Aggressive secondary risk factor modification including high intensity statin therapy.  Will need clinical cardiology and gastroenterology follow-up to coordinate care.  Cardiology follow-up should be within the next 4 weeks.  Hemoglobin in 2 and 4 weeks.  Labs/Other Tests and Data Reviewed:    EKG:  na  Recent Labs: 07/08/2018: ALT 18 07/09/2018: B Natriuretic Peptide 769.3 07/10/2018: Magnesium 2.2 07/14/2018: BUN 14; Creatinine, Ser 0.92; Hemoglobin 8.5; Platelets 232; Potassium 3.9; Sodium 134   Recent Lipid Panel Lab Results  Component Value Date/Time   CHOL 135 07/10/2018 03:39 AM   TRIG 47 07/10/2018 03:39 AM   HDL 41 07/10/2018 03:39 AM   CHOLHDL 3.3 07/10/2018 03:39 AM   LDLCALC 85 07/10/2018 03:39 AM    Wt Readings from Last 3 Encounters:  07/14/18 213 lb (96.6 kg)  06/24/18 212 lb 9.6 oz (96.4 kg)  04/04/18 201 lb 8 oz (91.4 kg)     Objective:    Vital Signs:  There were no vitals taken for this visit.   Normal affect, normal speech pattern and tone. Sounds comfortable and in no distress, no auditory signs of heavy breathing or SOB.   ASSESSMENT & PLAN:    1. CAD/ICM -recent NSTEMI as reported above, management complicated by GI bleed and severe anemia. He had a cath as reported above but disease managed medically due to antiplatelet limitation from GI bleed - currently on plavix alone, no ASA due to GI bleed and need for anticoagulation in near future for his afib - we will change lopressor to toprol 25mg  daily in setting of systolic dysfunction - intolerant to lipitor and pravastatin in the past, we will try crestor 5mg  every other day - residual CAD would treat medically in absence of current symptoms and limitations of anticoag/antiplatelet therapy. If stable on antiplatelet/anticoag therapy over time  and recurrent symptoms could consider repeat cath for LAD and RCA disease  - drop in LVEF during recent admission with NSTEMI. Changing lopressor to toprol as reported above, he is already on ARB. Titrate over follow up visits. Continue current diuretic, he denies any significant edema. See if pcp labs including BMET/Mg.   2. Afib - anticoag on hold due to recent GI bleed, he also has need for plavix in setting of recent medically managed NSTEMI - plan for xarelto pending recent cbc results, if started would need repeat cbc in 1-2 weeks   3. GI bleed - denies any recurrence, he reports cbc yesterday with pcp and told Hgb is increasing. We will request results.  - I think given his afib if started on xarelto would require full dose, low dose xarelto as used in the compass trial was discussed during admission however the effectiveness of this  dose in stroke prevention in setting of afib is not known.     Addendum:  PCP labs 07/20/18 available after our visit: K 4.6 Cr 0.92 Hgb 10.1 Plt 405  We will start xarelto 20mg  daily based on below CrCl. Repeat cbc in 2 weeks Estimated Creatinine Clearance: 74.2 mL/min (by C-G formula based on SCr of 0.92 mg/dL).      COVID-19 Education: The signs and symptoms of COVID-19 were discussed with the patient and how to seek care for testing (follow up with PCP or arrange E-visit).  The importance of social distancing was discussed today.  Time:   Today, I have spent 26 minutes with the patient with telehealth technology discussing the above problems.     Medication Adjustments/Labs and Tests Ordered: Current medicines are reviewed at length with the patient today.  Concerns regarding medicines are outlined above.  Tests Ordered: No orders of the defined types were placed in this encounter.  Medication Changes: No orders of the defined types were placed in this encounter.   Disposition:  Follow up 3 weeks.   Merrily Pew, MD   07/21/2018 8:50 AM    [place]

## 2018-07-21 NOTE — Telephone Encounter (Signed)
° °  COVID-19 Pre-Screening Questions: ° °• Do you currently have a fever? °•  °• Have you recently travelled on a cruise, internationally, or to [place], [place], [place], [place], [place], or [place], [place] ([employer]) ?  °•  °• Have you been in contact with someone that is currently pending confirmation of Covid19 testing or has been confirmed to have the Covid19 virus?  °•  °Are you currently experiencing fatigue or cough?  ° °

## 2018-07-21 NOTE — Patient Instructions (Signed)
Your physician recommends that you schedule a follow-up appointment in: [place] physician has recommended you make the following change in your medication:   STOP LOPRESSOR  START TOPROL XL 25 MG DAILY   START CRESTOR 5 MG EVERY OTHER DAY  START XARELTO 20 MG DAILY   Your physician recommends that you return for lab work in 2 WEEKS [place]   Thank you for choosing [facility]!!

## 2018-07-23 ENCOUNTER — Telehealth: Payer: Self-pay | Admitting: Cardiology

## 2018-07-23 NOTE — Telephone Encounter (Signed)
Patient is concerned about taking blood thinners. He would like to talk to someone.

## 2018-07-23 NOTE — Telephone Encounter (Signed)
Wife questioned the use of Plavix and I explained to her that plavix is considered an antiplatelet medication. She was alright with this information.

## 2018-08-05 ENCOUNTER — Inpatient Hospital Stay (HOSPITAL_COMMUNITY)
Admission: EM | Admit: 2018-08-05 | Discharge: 2018-08-09 | DRG: 281 | Disposition: A | Discharge status: Home / Self Care | Payer: Medicare PPO | Attending: Internal Medicine | Admitting: Internal Medicine

## 2018-08-05 ENCOUNTER — Encounter (HOSPITAL_COMMUNITY): Payer: Self-pay

## 2018-08-05 ENCOUNTER — Other Ambulatory Visit: Payer: Self-pay

## 2018-08-05 ENCOUNTER — Emergency Department (HOSPITAL_COMMUNITY): Payer: Medicare PPO

## 2018-08-05 DIAGNOSIS — K210 Gastro-esophageal reflux disease with esophagitis: Secondary | ICD-10-CM | POA: Diagnosis present

## 2018-08-05 DIAGNOSIS — I10 Essential (primary) hypertension: Secondary | ICD-10-CM

## 2018-08-05 DIAGNOSIS — E861 Hypovolemia: Secondary | ICD-10-CM | POA: Diagnosis not present

## 2018-08-05 DIAGNOSIS — I110 Hypertensive heart disease with heart failure: Secondary | ICD-10-CM | POA: Diagnosis present

## 2018-08-05 DIAGNOSIS — E785 Hyperlipidemia, unspecified: Secondary | ICD-10-CM | POA: Diagnosis present

## 2018-08-05 DIAGNOSIS — I472 Ventricular tachycardia: Secondary | ICD-10-CM | POA: Diagnosis present

## 2018-08-05 DIAGNOSIS — D62 Acute posthemorrhagic anemia: Secondary | ICD-10-CM | POA: Diagnosis present

## 2018-08-05 DIAGNOSIS — D649 Anemia, unspecified: Secondary | ICD-10-CM | POA: Diagnosis present

## 2018-08-05 DIAGNOSIS — Z7982 Long term (current) use of aspirin: Secondary | ICD-10-CM

## 2018-08-05 DIAGNOSIS — I491 Atrial premature depolarization: Secondary | ICD-10-CM | POA: Diagnosis present

## 2018-08-05 DIAGNOSIS — I480 Paroxysmal atrial fibrillation: Secondary | ICD-10-CM | POA: Diagnosis not present

## 2018-08-05 DIAGNOSIS — R739 Hyperglycemia, unspecified: Secondary | ICD-10-CM | POA: Diagnosis present

## 2018-08-05 DIAGNOSIS — R079 Chest pain, unspecified: Secondary | ICD-10-CM | POA: Diagnosis present

## 2018-08-05 DIAGNOSIS — I5042 Chronic combined systolic (congestive) and diastolic (congestive) heart failure: Secondary | ICD-10-CM | POA: Diagnosis present

## 2018-08-05 DIAGNOSIS — I25119 Atherosclerotic heart disease of native coronary artery with unspecified angina pectoris: Principal | ICD-10-CM | POA: Diagnosis present

## 2018-08-05 DIAGNOSIS — I5032 Chronic diastolic (congestive) heart failure: Secondary | ICD-10-CM | POA: Diagnosis not present

## 2018-08-05 DIAGNOSIS — Z8249 Family history of ischemic heart disease and other diseases of the circulatory system: Secondary | ICD-10-CM

## 2018-08-05 DIAGNOSIS — Z7951 Long term (current) use of inhaled steroids: Secondary | ICD-10-CM | POA: Diagnosis not present

## 2018-08-05 DIAGNOSIS — J449 Chronic obstructive pulmonary disease, unspecified: Secondary | ICD-10-CM | POA: Diagnosis present

## 2018-08-05 DIAGNOSIS — I4892 Unspecified atrial flutter: Secondary | ICD-10-CM | POA: Diagnosis present

## 2018-08-05 DIAGNOSIS — Z7902 Long term (current) use of antithrombotics/antiplatelets: Secondary | ICD-10-CM | POA: Diagnosis not present

## 2018-08-05 DIAGNOSIS — Z7901 Long term (current) use of anticoagulants: Secondary | ICD-10-CM | POA: Diagnosis not present

## 2018-08-05 DIAGNOSIS — K922 Gastrointestinal hemorrhage, unspecified: Secondary | ICD-10-CM | POA: Diagnosis present

## 2018-08-05 DIAGNOSIS — I214 Non-ST elevation (NSTEMI) myocardial infarction: Secondary | ICD-10-CM | POA: Diagnosis present

## 2018-08-05 DIAGNOSIS — I248 Other forms of acute ischemic heart disease: Secondary | ICD-10-CM | POA: Diagnosis not present

## 2018-08-05 DIAGNOSIS — N400 Enlarged prostate without lower urinary tract symptoms: Secondary | ICD-10-CM | POA: Diagnosis present

## 2018-08-05 DIAGNOSIS — Z87891 Personal history of nicotine dependence: Secondary | ICD-10-CM | POA: Diagnosis not present

## 2018-08-05 DIAGNOSIS — Z823 Family history of stroke: Secondary | ICD-10-CM

## 2018-08-05 DIAGNOSIS — E876 Hypokalemia: Secondary | ICD-10-CM | POA: Diagnosis not present

## 2018-08-05 DIAGNOSIS — I249 Acute ischemic heart disease, unspecified: Secondary | ICD-10-CM

## 2018-08-05 DIAGNOSIS — I2129 ST elevation (STEMI) myocardial infarction involving other sites: Secondary | ICD-10-CM

## 2018-08-05 DIAGNOSIS — Z833 Family history of diabetes mellitus: Secondary | ICD-10-CM

## 2018-08-05 DIAGNOSIS — I255 Ischemic cardiomyopathy: Secondary | ICD-10-CM | POA: Diagnosis present

## 2018-08-05 DIAGNOSIS — R0602 Shortness of breath: Secondary | ICD-10-CM

## 2018-08-05 DIAGNOSIS — I252 Old myocardial infarction: Secondary | ICD-10-CM

## 2018-08-05 HISTORY — DX: Non-ST elevation (NSTEMI) myocardial infarction: I21.4

## 2018-08-05 HISTORY — DX: Dyspnea, unspecified: R06.00

## 2018-08-05 HISTORY — DX: Anemia, unspecified: D64.9

## 2018-08-05 LAB — CBC
HCT: 22.1 % — ABNORMAL LOW (ref 39.0–52.0)
Hemoglobin: 6.7 g/dL — CL (ref 13.0–17.0)
MCH: 26.4 pg (ref 26.0–34.0)
MCHC: 30.3 g/dL (ref 30.0–36.0)
MCV: 87 fL (ref 80.0–100.0)
Platelets: 187 10*3/uL (ref 150–400)
RBC: 2.54 MIL/uL — ABNORMAL LOW (ref 4.22–5.81)
RDW: 17 % — ABNORMAL HIGH (ref 11.5–15.5)
WBC: 9.9 10*3/uL (ref 4.0–10.5)
nRBC: 0.2 % (ref 0.0–0.2)

## 2018-08-05 LAB — COMPREHENSIVE METABOLIC PANEL
ALT: 15 U/L (ref 0–44)
AST: 17 U/L (ref 15–41)
Albumin: 3.4 g/dL — ABNORMAL LOW (ref 3.5–5.0)
Alkaline Phosphatase: 60 U/L (ref 38–126)
Anion gap: 12 (ref 5–15)
BUN: 38 mg/dL — ABNORMAL HIGH (ref 8–23)
CO2: 26 mmol/L (ref 22–32)
Calcium: 9.2 mg/dL (ref 8.9–10.3)
Chloride: 101 mmol/L (ref 98–111)
Creatinine, Ser: 0.93 mg/dL (ref 0.61–1.24)
GFR calc Af Amer: >60 mL/min (ref >60)
GFR calc non Af Amer: >60 mL/min (ref >60)
Glucose, Bld: 195 mg/dL — ABNORMAL HIGH (ref 70–99)
Potassium: 3.4 mmol/L — ABNORMAL LOW (ref 3.5–5.1)
Sodium: 139 mmol/L (ref 135–145)
Total Bilirubin: 0.4 mg/dL (ref 0.3–1.2)
Total Protein: 6 g/dL — ABNORMAL LOW (ref 6.5–8.1)

## 2018-08-05 LAB — PHOSPHORUS: Phosphorus: 3.3 mg/dL (ref 2.5–4.6)

## 2018-08-05 LAB — TROPONIN I: Troponin I: 0.16 ng/mL (ref <0.03)

## 2018-08-05 LAB — MRSA PCR SCREENING: MRSA by PCR: NEGATIVE

## 2018-08-05 LAB — CBC WITH DIFFERENTIAL/PLATELET
Abs Immature Granulocytes: 0.02 10*3/uL (ref 0.00–0.07)
Basophils Absolute: 0 10*3/uL (ref 0.0–0.1)
Basophils Relative: 0 %
Eosinophils Absolute: 0.1 10*3/uL (ref 0.0–0.5)
Eosinophils Relative: 1 %
HCT: 18.1 % — ABNORMAL LOW (ref 39.0–52.0)
Hemoglobin: 5.2 g/dL — CL (ref 13.0–17.0)
Immature Granulocytes: 0 %
Lymphocytes Relative: 27 %
Lymphs Abs: 2 10*3/uL (ref 0.7–4.0)
MCH: 26.7 pg (ref 26.0–34.0)
MCHC: 28.7 g/dL — ABNORMAL LOW (ref 30.0–36.0)
MCV: 92.8 fL (ref 80.0–100.0)
Monocytes Absolute: 1.4 10*3/uL — ABNORMAL HIGH (ref 0.1–1.0)
Monocytes Relative: 19 %
Neutro Abs: 4 10*3/uL (ref 1.7–7.7)
Neutrophils Relative %: 53 %
Platelets: 221 10*3/uL (ref 150–400)
RBC: 1.95 MIL/uL — ABNORMAL LOW (ref 4.22–5.81)
RDW: 17.9 % — ABNORMAL HIGH (ref 11.5–15.5)
WBC: 7.6 10*3/uL (ref 4.0–10.5)
nRBC: 0.3 % — ABNORMAL HIGH (ref 0.0–0.2)

## 2018-08-05 LAB — HEMOGLOBIN A1C
Hgb A1c MFr Bld: 5.2 % (ref 4.8–5.6)
Mean Plasma Glucose: 102.54 mg/dL

## 2018-08-05 LAB — MAGNESIUM: Magnesium: 2.1 mg/dL (ref 1.7–2.4)

## 2018-08-05 LAB — OCCULT BLOOD X 1 CARD TO LAB, STOOL: Fecal Occult Bld: POSITIVE — AB

## 2018-08-05 LAB — PREPARE RBC (CROSSMATCH)

## 2018-08-05 LAB — POTASSIUM: Potassium: 4.1 mmol/L (ref 3.5–5.1)

## 2018-08-05 LAB — BRAIN NATRIURETIC PEPTIDE: B Natriuretic Peptide: 172 pg/mL — ABNORMAL HIGH (ref 0.0–100.0)

## 2018-08-05 MED ORDER — SODIUM CHLORIDE 0.9% IV SOLUTION: Freq: Once | INTRAVENOUS | Status: DC

## 2018-08-05 MED ORDER — SODIUM CHLORIDE 0.9 % IV SOLN
8.0000 mg/h | INTRAVENOUS | Status: AC
  Administered 2018-08-05 – 2018-08-07 (×7): 8 mg/h via INTRAVENOUS
  Filled 2018-08-05 (×10): qty 80

## 2018-08-05 MED ORDER — FUROSEMIDE 10 MG/ML IJ SOLN
20.0000 mg | Freq: Once | INTRAMUSCULAR | Status: AC
  Administered 2018-08-05: 20 mg via INTRAVENOUS

## 2018-08-05 MED ORDER — METOPROLOL SUCCINATE ER 25 MG PO TB24
25.0000 mg | ORAL_TABLET | Freq: Every day | ORAL | Status: DC
  Administered 2018-08-05 – 2018-08-09 (×5): 25 mg via ORAL
  Filled 2018-08-05 (×5): qty 1

## 2018-08-05 MED ORDER — PANTOPRAZOLE SODIUM 40 MG IV SOLR: 40.0000 mg | Freq: Two times a day (BID) | INTRAVENOUS | Status: DC

## 2018-08-05 MED ORDER — NITROGLYCERIN 0.4 MG SL SUBL: 0.4000 mg | SUBLINGUAL_TABLET | SUBLINGUAL | Status: DC | PRN

## 2018-08-05 MED ORDER — MAGNESIUM SULFATE 2 GM/50ML IV SOLN
2.0000 g | Freq: Once | INTRAVENOUS | Status: AC
  Administered 2018-08-05: 2 g via INTRAVENOUS
  Filled 2018-08-05: qty 50

## 2018-08-05 MED ORDER — POTASSIUM CHLORIDE 10 MEQ/100ML IV SOLN
10.0000 meq | INTRAVENOUS | Status: AC
  Administered 2018-08-05 (×2): 10 meq via INTRAVENOUS
  Filled 2018-08-05 (×2): qty 100

## 2018-08-05 MED ORDER — ROSUVASTATIN CALCIUM 5 MG PO TABS
5.0000 mg | ORAL_TABLET | Freq: Every day | ORAL | Status: DC
  Administered 2018-08-06 – 2018-08-08 (×3): 5 mg via ORAL
  Filled 2018-08-05 (×3): qty 1

## 2018-08-05 MED ORDER — POTASSIUM CHLORIDE 10 MEQ/100ML IV SOLN
10.0000 meq | INTRAVENOUS | Status: DC
  Administered 2018-08-05: 10:00:00 10 meq via INTRAVENOUS
  Filled 2018-08-05: qty 100

## 2018-08-05 MED ORDER — PROCHLORPERAZINE EDISYLATE 10 MG/2ML IJ SOLN: 5.0000 mg | INTRAMUSCULAR | Status: DC | PRN

## 2018-08-05 MED ORDER — SODIUM CHLORIDE 0.9 % IV SOLN
10.0000 mL/h | Freq: Once | INTRAVENOUS | Status: AC
  Administered 2018-08-05: 05:00:00 10 mL/h via INTRAVENOUS

## 2018-08-05 MED ORDER — FUROSEMIDE 10 MG/ML IJ SOLN
INTRAMUSCULAR | Status: AC
  Filled 2018-08-05: qty 2

## 2018-08-05 MED ORDER — PANTOPRAZOLE SODIUM 40 MG IV SOLR
INTRAVENOUS | Status: AC
  Filled 2018-08-05: qty 160

## 2018-08-05 MED ORDER — ACETAMINOPHEN 325 MG PO TABS: 650.0000 mg | ORAL_TABLET | ORAL | Status: DC | PRN

## 2018-08-05 MED ORDER — SODIUM CHLORIDE 0.9 % IV SOLN
80.0000 mg | Freq: Once | INTRAVENOUS | Status: AC
  Administered 2018-08-05: 05:00:00 80 mg via INTRAVENOUS
  Filled 2018-08-05: qty 80

## 2018-08-05 NOTE — ED Provider Notes (Addendum)
[facility] EMERGENCY DEPARTMENT Provider Note   CSN: [id number] Arrival date & time: 08/05/18  0350    History   Chief Complaint Chief Complaint  Patient presents with  . Chest Pain  . Abdominal Pain    HPI Michael Stephens is a 84 y.o. male.     Patient presents to the emergency department for evaluation of chest pain and shortness of breath.  He reports that he awakened from sleep approximately an hour ago.  Pain did radiate to both arms initially.  He reports that he feels like he cannot catch his breath.  Patient reports that he had a heart attack in the beginning part of this month, was sent to [facility].  This feels similar.     Past Medical History:  Diagnosis Date  . Acute diastolic CHF (congestive heart failure) ([place]) 02/11/2017  . Atrial flutter ([place])    a. diagnosed in 02/2017. Rate-control strategy pursued.   Marland Kitchen BPH (benign prostatic hyperplasia)   . Chronic diastolic heart failure ([place])   . Hypertension   . Renal disorder    cyst on kidney    Patient Active Problem List   Diagnosis Date Noted  . Acute coronary syndrome ([place]) 08/05/2018  . NSTEMI (non-ST elevated myocardial infarction) ([place]) 07/09/2018  . Anemia 07/08/2018  . Heme positive stool 07/08/2018  . Coagulopathy ([place]) 07/08/2018  . Chronic diastolic CHF (congestive heart failure) ([place]) 07/08/2018  . Upper GI bleed 07/08/2018  . Shortness of breath 04/01/2018  . BPH (benign prostatic hyperplasia) 04/01/2018  . AF (paroxysmal atrial fibrillation) ([place]) 04/01/2018  . Acute on chronic systolic heart failure ([place]) 03/31/2018  . Urinary retention 09/07/2017  . Constipation 09/07/2017  . CHF exacerbation ([place]) 09/04/2017  . Acute on chronic diastolic CHF (congestive heart failure) ([place]) 09/04/2017  . Encounter for therapeutic drug monitoring 08/27/2017  . Change in stool 03/18/2017  . Diarrhea 03/02/2017  . Aortic stenosis 03/02/2017  . CAP (community acquired pneumonia) 02/16/2017  . Essential  hypertension 02/11/2017  . Demand ischemia ([place]) 02/11/2017  . Acute diastolic CHF (congestive heart failure) ([place]) 02/11/2017  . Acute respiratory failure with hypoxia ([place]) 02/11/2017  . Paroxysmal atrial flutter ([place]) 02/09/2017    Past Surgical History:  Procedure Laterality Date  . ABCESS DRAINAGE  11/2008   BUTTOCKS  . BIOPSY  07/13/2018   Procedure: BIOPSY;  Surgeon: Otis Brace, MD;  Location: [facility];  Service: Gastroenterology;;  . CATARACT EXTRACTION, BILATERAL    . COLONOSCOPY N/A 04/02/2017   Procedure: COLONOSCOPY;  Surgeon: Rogene Houston, MD;  Location[facility];  Service: Endoscopy;  Laterality: N/A;  10:55  . COLONOSCOPY WITH PROPOFOL N/A 07/13/2018   Procedure: COLONOSCOPY WITH PROPOFOL;  Surgeon: Otis Brace, MD;  Location: [place];  Service: Gastroenterology;  Laterality: N/A;  . ESOPHAGOGASTRODUODENOSCOPY (EGD) WITH PROPOFOL N/A 07/09/2018   Procedure: ESOPHAGOGASTRODUODENOSCOPY (EGD) WITH PROPOFOL;  Surgeon: Wilford Corner, MD;  Location: [place];  Service: Endoscopy;  Laterality: N/A;  . LEFT HEART CATH AND CORONARY ANGIOGRAPHY N/A 07/14/2018   Procedure: LEFT HEART CATH AND CORONARY ANGIOGRAPHY;  Surgeon: Belva Crome, MD;  Location: [place] CV LAB;  Service: Cardiovascular;  Laterality: N/A;  . POLYPECTOMY  04/02/2017   Procedure: POLYPECTOMY;  Surgeon: Rogene Houston, MD;  [facility];  Service: Endoscopy;;  asceding colon(hot snare)/ sigmoid colon times two (cold snare)  . POLYPECTOMY  07/13/2018   Procedure: POLYPECTOMY;  Surgeon: Otis Brace, MD;  Location: [place] ENDOSCOPY;  Service: Gastroenterology;;  Home Medications    Prior to Admission medications   Medication Sig Start Date End Date Taking? Authorizing Provider  budesonide-formoterol (SYMBICORT) 160-4.5 MCG/ACT inhaler Inhale 2 puffs into the lungs 2 (two) times daily.    [provider]  clopidogrel (PLAVIX) 75 MG tablet Take  1 tablet (75 mg total) by mouth daily. 07/21/18   Norval Morton, MD  finasteride (PROSCAR) 5 MG tablet Take 5 mg by mouth daily.    [provider]  furosemide (LASIX) 80 MG tablet Take 0.5-1 tablets (40-80 mg total) by mouth daily. Take 40 mg p.o. daily on Monday, Wednesday, Friday, and Sunday.  Take Lasix 80 mg p.o. daily on Tuesday, Thursday, and Saturday 07/14/18   Norval Morton, MD  isosorbide mononitrate (IMDUR) 30 MG 24 hr tablet Take 1 tablet (30 mg total) by mouth at bedtime. 07/14/18   Norval Morton, MD  losartan (COZAAR) 25 MG tablet Take 1 tablet (25 mg total) by mouth daily. 09/09/17 06/24/19  Johnson, Clanford L, MD  metoprolol succinate (TOPROL XL) 25 MG 24 hr tablet Take 1 tablet (25 mg total) by mouth daily. 07/21/18   Arnoldo Lenis, MD  nitroGLYCERIN (NITROSTAT) 0.4 MG SL tablet Place 1 tablet (0.4 mg total) under the tongue every 5 (five) minutes as needed for chest pain (CP or SOB). 07/14/18   Norval Morton, MD  oxyCODONE (OXY IR/ROXICODONE) 5 MG immediate release tablet Take 1 tablet (5 mg total) by mouth every 4 (four) hours as needed for moderate pain. 07/14/18   Norval Morton, MD  pantoprazole (PROTONIX) 40 MG tablet Take 1 tablet (40 mg total) by mouth daily. 07/15/18   Norval Morton, MD  potassium chloride (K-DUR,KLOR-CON) 20 MEQ tablet Take 1 tablet (20 mEq total) by mouth daily. 09/09/17 06/24/19  Murlean Iba, MD  rivaroxaban (XARELTO) 20 MG TABS tablet Take 1 tablet (20 mg total) by mouth daily with supper. 07/21/18   Arnoldo Lenis, MD  rosuvastatin (CRESTOR) 5 MG tablet Take 1 tablet (5 mg total) by mouth every other day. 07/21/18 10/19/18  Arnoldo Lenis, MD  tamsulosin (FLOMAX) 0.4 MG CAPS capsule Take 0.4 mg by mouth daily.     [provider]  vitamin B-12 (CYANOCOBALAMIN) 500 MCG tablet Take 1 tablet (500 mcg total) by mouth daily. 04/05/18   Orson Eva, MD    Family History Family History  Problem Relation Age of Onset  . CVA  Father   . CAD Father   . Diabetes Sister   . Colon cancer Neg Hx     Social History Social History   Tobacco Use  . Smoking status: Former Smoker    Types: Cigarettes    Last attempt to quit: 2006    Years since quitting: 14.3  . Smokeless tobacco: Never Used  Substance Use Topics  . Alcohol use: No  . Drug use: No     Allergies   Atorvastatin and Penicillins   Review of Systems Review of Systems  Respiratory: Positive for shortness of breath.   Cardiovascular: Positive for chest pain.  All other systems reviewed and are negative.    Physical Exam Updated Vital Signs BP (!) 108/55   Pulse 100   Temp 98 F (36.7 C) (Oral)   Resp (!) 25   Ht 6' (1.829 m)   Wt 95.3 kg   SpO2 97%   BMI 28.48 kg/m   Physical Exam Vitals signs and nursing note reviewed.  Constitutional:  General: He is not in acute distress.    Appearance: Normal appearance. He is well-developed.  HENT:     Head: Normocephalic and atraumatic.     Right Ear: Hearing normal.     Left Ear: Hearing normal.     Nose: Nose normal.  Eyes:     Conjunctiva/sclera: Conjunctivae normal.     Pupils: Pupils are equal, round, and reactive to light.  Neck:     Musculoskeletal: Normal range of motion and neck supple.  Cardiovascular:     Rate and Rhythm: Regular rhythm.     Heart sounds: S1 normal and S2 normal. No murmur. No friction rub. No gallop.   Pulmonary:     Effort: Pulmonary effort is normal. No respiratory distress.     Breath sounds: Normal breath sounds.  Chest:     Chest wall: No tenderness.  Abdominal:     General: Bowel sounds are normal.     Palpations: Abdomen is soft.     Tenderness: There is no abdominal tenderness. There is no guarding or rebound. Negative signs include Murphy's sign and McBurney's sign.     Hernia: No hernia is present.  Musculoskeletal: Normal range of motion.  Skin:    General: Skin is warm and dry.     Findings: No rash.  Neurological:     Mental  Status: He is alert and oriented to person, place, and time.     GCS: GCS eye subscore is 4. GCS verbal subscore is 5. GCS motor subscore is 6.     Cranial Nerves: No cranial nerve deficit.     Sensory: No sensory deficit.     Coordination: Coordination normal.  Psychiatric:        Speech: Speech normal.        Behavior: Behavior normal.        Thought Content: Thought content normal.      ED Treatments / Results  Labs (all labs ordered are listed, but only abnormal results are displayed) Labs Reviewed  CBC WITH DIFFERENTIAL/PLATELET - Abnormal; Notable for the following components:      Result Value   RBC 1.95 (*)    Hemoglobin 5.2 (*)    HCT 18.1 (*)    MCHC 28.7 (*)    RDW 17.9 (*)    nRBC 0.3 (*)    Monocytes Absolute 1.4 (*)    All other components within normal limits  COMPREHENSIVE METABOLIC PANEL - Abnormal; Notable for the following components:   Potassium 3.4 (*)    Glucose, Bld 195 (*)    BUN 38 (*)    Total Protein 6.0 (*)    Albumin 3.4 (*)    All other components within normal limits  TROPONIN I - Abnormal; Notable for the following components:   Troponin I 0.16 (*)    All other components within normal limits  BRAIN NATRIURETIC PEPTIDE - Abnormal; Notable for the following components:   B Natriuretic Peptide 172.0 (*)    All other components within normal limits  MAGNESIUM  TROPONIN I  TROPONIN I  TROPONIN I  PHOSPHORUS  PREPARE RBC (CROSSMATCH)  TYPE AND SCREEN    EKG EKG Interpretation  Date/Time:  Thursday August 05 2018 04:01:23 EDT Ventricular Rate:  98 PR Interval:    QRS Duration: 118 QT Interval:  311 QTC Calculation: 397 R Axis:   39 Text Interpretation:  Sinus or ectopic atrial rhythm Atrial premature complex Nonspecific intraventricular conduction delay Consider posterior infarct Repol abnrm, severe global  ischemia (LM/MVD) Confirmed by Orpah Greek [phone]) on 08/05/2018 4:05:08 AM   Radiology Dg Chest Port 1  View  Result Date: 08/05/2018 CLINICAL DATA:  84 year old male with chest pain and shortness of breath. EXAM: PORTABLE CHEST 1 VIEW COMPARISON:  07/08/2018 and earlier. FINDINGS: Portable AP upright view at 0424 hours. Mildly improved lung volumes and regressed bilateral pulmonary interstitial opacity since March. Stable cardiac size and mediastinal contours. Visualized tracheal air column is within normal limits. No pneumothorax, consolidation or pleural effusion. Lung markings appear at baseline. Paucity of bowel gas in the upper abdomen. No acute osseous abnormality identified. IMPRESSION: Resolved abnormal pulmonary interstitial opacity since March. No acute cardiopulmonary abnormality. Electronically Signed   By: Genevie Ann M.D.   On: 08/05/2018 04:57    Procedures Procedures (including critical care time)  Medications Ordered in ED Medications  pantoprazole (PROTONIX) 80 mg in sodium chloride 0.9 % 100 mL IVPB (80 mg Intravenous New Bag/Given 08/05/18 0510)  pantoprazole (PROTONIX) 80 mg in sodium chloride 0.9 % 250 mL (0.32 mg/mL) infusion (has no administration in time range)  pantoprazole (PROTONIX) injection 40 mg (has no administration in time range)  magnesium sulfate IVPB 2 g 50 mL (has no administration in time range)  potassium chloride 10 mEq in 100 mL IVPB (has no administration in time range)  nitroGLYCERIN (NITROSTAT) SL tablet 0.4 mg (has no administration in time range)  acetaminophen (TYLENOL) tablet 650 mg (has no administration in time range)  prochlorperazine (COMPAZINE) injection 5 mg (has no administration in time range)  0.9 %  sodium chloride infusion (10 mL/hr Intravenous New Bag/Given 08/05/18 0440)  furosemide (LASIX) injection 20 mg (20 mg Intravenous Given 08/05/18 0444)     Initial Impression / Assessment and Plan / ED Course  I have reviewed the triage vital signs and the nursing notes.  Pertinent labs & imaging results that were available during my care of the  patient were reviewed by me and considered in my medical decision making (see chart for details).       Patient underwent left heart catheterization in April 2020:  Significant three-vessel coronary artery disease.  Segmental 70% proximal to mid LAD.  This is an intermediate stenosis but would likely have positive FFR if tested.  Total occlusion of the second and third obtuse marginal branches, the sources of the patient's recent non-ST elevation myocardial infarction.  The LAD supplies collaterals to the third obtuse marginal which is a large of the 3 obtuse marginals.  85% proximal RCA.  Right dominant coronary anatomy.  No aortic valve gradient  Mildly elevated LVEDP  Patient arrived with active chest pain.  Patient had an NSTEMI 3 weeks ago.  Heart catheterization during that hospitalization revealed severe three-vessel disease.  No interventions were performed.  Patient experiencing similar symptoms today.  EKG at arrival concerning for posterior MI.  This was confirmed with posterior EKG.  Discussed with on-call cardiology, Dr. Sonia Side.  Based on the fact that the patient is profoundly anemic, do not activate as a STEMI.  Symptoms, EKG changes likely secondary to his symptomatic anemia.  Transfuse with blood, admit to hospitalist at [facility].  No aspirin because of GI bleed.  No additional anticoagulation because of GI bleed.  Patient borderline hypotensive and might have inferior component, will not provide any additional nitroglycerin.  Patient does have a history of CHF.  He will require this blood urgently, however, therefore will administer Lasix 20 mg IV to facilitate more quick transfusion of  2 units of packed red blood cells.  CRITICAL CARE Performed by: Orpah Greek   Total critical care time: 40 minutes  Critical care time was exclusive of separately billable procedures and treating other patients.  Critical care was necessary to treat or prevent  imminent or life-threatening deterioration.  Critical care was time spent personally by me on the following activities: development of treatment plan with patient and/or surrogate as well as nursing, discussions with consultants, evaluation of patient's response to treatment, examination of patient, obtaining history from patient or surrogate, ordering and performing treatments and interventions, ordering and review of laboratory studies, ordering and review of radiographic studies, pulse oximetry and re-evaluation of patient's condition.   Final Clinical Impressions(s) / ED Diagnoses   Final diagnoses:  Symptomatic anemia  Posterior MI [facility])    ED Discharge Orders    None       Lurdes Haltiwanger, Gwenyth Allegra, MD 08/05/18 [id number]    Orpah Greek, MD 08/05/18 [phone]

## 2018-08-05 NOTE — Progress Notes (Signed)
CRITICAL VALUE ALERT  Critical Value:  Hgb 6.7  Date & Time Notied:  08/05/18 1517  Provider Notified: Dr Karleen Hampshire  Orders Received/Actions taken:

## 2018-08-05 NOTE — ED Notes (Signed)
Number 2 blood bag still transfusing at 250/hr.

## 2018-08-05 NOTE — Progress Notes (Signed)
Michael Stephens is a 84 y.o. male with medical history significant of chronic combined systolic and diastolic CHF, atrial flutter, hypertension, BPH, renal cyst, chronic anemia, CAD, recent NSTEMI with diagnostic cath treated medically who is coming to the emergency department due to chest pain that woke him up from sleep prior coming to ED.    Pt had recent cath done and he was on xarelto and plavix. He also underwent EGD and colonoscopy which were unrevealing for anemia of blood loss.  This admission he was found to have profound anemia with a hemoglobin of 5.2.   Cardiology and GI consulted.  He underwent 2 units prbc transfusion and plan to get another 2 units later today to keep his hemoglobin greater than 8.   Plan for capsule endoscopy tomorrow.  Appreciate recommendations by GI and cardiology.  Meanwhile continue with PPI iv , hold anti platelet agents and anti coagulation.  Discussed the plan with the patient.   Patient was admitted earlier today and see Dr Olevia Bowens note for detailed H&P   [name], MD  [phone]

## 2018-08-05 NOTE — Consult Note (Signed)
Cardiology Consultation:   Patient ID: Michael Stephens MRN: [id number]; DOB: 02-09-1935  Admit date: 08/05/2018 Date of Consult: 08/05/2018  Primary Care Provider: [facility], [facility] Primary Cardiologist: Carlyle Dolly, MD  Primary Electrophysiologist:  None    Patient Profile:   Michael Stephens is a 84 y.o. male with a hx of atrial fibrillation/atrial flutter, chronic diastolic heart failure, COPD and CAD who is being seen today for the evaluation of possible ACS in the setting of severe anemia at the request of Dr. Karleen Hampshire.  History of Present Illness:   Michael Stephens is a 84 year old male was past medical history of atrial fibrillation/atrial flutter since November 2018- managed with rate control, chronic diastolic heart failure, COPD and CAD.  Patient had abnormal stress test in 2018 that was intermediate risk but managed medically unless symptom resolved on medical therapy.  He was previously on NOAC, but later switched to Coumadin due to financial restraints.  Echocardiogram obtained on 04/01/2018 showed EF 55 to 60%, mild aortic stenosis, severe LAE.    More recently, patient presented to the hospital in March 2020 with chest pain.  EKG showed quite significant ST depression in the anterolateral leads.  Echocardiogram was repeated on 07/09/2018 which revealed EF of 40 to 45%, moderately elevated RV pressure, moderate LAE, mild dilatation of the aortic root measuring 40 mm, severe hypokinesis of the left ventricle and entire inferolateral wall.  He was ruled in for NSTEMI by cardiac enzymes.  Lab work also showed significant anemia at 7.9.  GI was consulted.  EGD performed on 3/27 showed grade B reflux esophagitis and acute gastritis.  CT of abdomen did not show obvious etiology for anemia.  Colonoscopy was performed on 3/31 which showed few colonic polyps which was removed with cold snare and biopsy forceps, no evidence of active bleeding in the colon as well as in the terminal ileum.   Hospital course was complicated by recurrence of atrial fibrillation with RVR in the setting of anemia on 3/28, systemic anticoagulation was held due to concern of severe anemia.  He eventually underwent cardiac catheterization on 07/14/2018 which revealed 70% mid LAD lesion, totally occluded OM 2 and OM 3, 85% proximal to mid dominant RCA.  Based on recommendation by Dr. Tamala Julian, ultimately he will need revascularization once he is stable from the GI bleeding perspective.  It was recommended the patient start with Plavix then reassess as outpatient for possible bleeding and hemoglobin check after roughly 1 week, and then consider restarting Xarelto afterward.  He was seen in virtual visit by Dr Harl Bowie on 07/21/18 and doing well. Hgb increased to 10.1. he was started on Xarelto 20 mg daily in addition to Plavix.   Patient returned to ED shortly after midnight this morning was chest pain or shortness of breath.  Ecg arrival was concerning for posterior STEMI versus severe anterolateral subendocardial ischemia.  The case was discussed with on-call cardiology fellow at [facility] where the patient was subsequently transferred to.  However this morning's lab work revealed a severe anemia with hemoglobin 5.2, after discussing with on-call interventional cardiologist, it was decided not to activate code STEMI and manage the patient medically for the time being.  He was given 20 mg IV Lasix followed by 2 units of packed red blood cell.  Cardiology has been consulted for possible ACS in the setting of severe anemia.  Patient's last documented episode of Afib was in March. He has no history of CVA/TIA. Notes black/greenish stools that he has attributed  to taking Pepto-bismol. He notes breathing/chest pain much better after transfusion this am.   Past Medical History:  Diagnosis Date  . Acute diastolic CHF (congestive heart failure) ([place]) 02/11/2017  . Atrial flutter ([place])    a. diagnosed in 02/2017.  Rate-control strategy pursued.   Marland Kitchen BPH (benign prostatic hyperplasia)   . Chronic diastolic heart failure ([place])   . Hypertension   . Renal disorder    cyst on kidney    Past Surgical History:  Procedure Laterality Date  . ABCESS DRAINAGE  11/2008   BUTTOCKS  . BIOPSY  07/13/2018   Procedure: BIOPSY;  Surgeon: Otis Brace, MD;  Location: [facility];  Service: Gastroenterology;;  . CATARACT EXTRACTION, BILATERAL    . COLONOSCOPY N/A 04/02/2017   Procedure: COLONOSCOPY;  Surgeon: Rogene Houston, MD;  [facility];  Service: Endoscopy;  Laterality: N/A;  10:55  . COLONOSCOPY WITH PROPOFOL N/A 07/13/2018   Procedure: COLONOSCOPY WITH PROPOFOL;  Surgeon: Otis Brace, MD;  Location: [place];  Service: Gastroenterology;  Laterality: N/A;  . ESOPHAGOGASTRODUODENOSCOPY (EGD) WITH PROPOFOL N/A 07/09/2018   Procedure: ESOPHAGOGASTRODUODENOSCOPY (EGD) WITH PROPOFOL;  Surgeon: Wilford Corner, MD;  Location: [place];  Service: Endoscopy;  Laterality: N/A;  . LEFT HEART CATH AND CORONARY ANGIOGRAPHY N/A 07/14/2018   Procedure: LEFT HEART CATH AND CORONARY ANGIOGRAPHY;  Surgeon: Belva Crome, MD;  Location: [place] [facility];  Service: Cardiovascular;  Laterality: N/A;  . POLYPECTOMY  04/02/2017   Procedure: POLYPECTOMY;  Surgeon: Rogene Houston, MD;  [facility];  Service: Endoscopy;;  asceding colon(hot snare)/ sigmoid colon times two (cold snare)  . POLYPECTOMY  07/13/2018   Procedure: POLYPECTOMY;  Surgeon: Otis Brace, MD;  Location: [facility];  Service: Gastroenterology;;     Home Medications:  Prior to Admission medications   Medication Sig Start Date End Date Taking? Authorizing Provider  budesonide-formoterol (SYMBICORT) 160-4.5 MCG/ACT inhaler Inhale 2 puffs into the lungs 2 (two) times daily.    [provider]  clopidogrel (PLAVIX) 75 MG tablet Take 1 tablet (75 mg total) by mouth daily. 07/21/18   Norval Morton, MD   finasteride (PROSCAR) 5 MG tablet Take 5 mg by mouth daily.    [provider]  furosemide (LASIX) 80 MG tablet Take 0.5-1 tablets (40-80 mg total) by mouth daily. Take 40 mg p.o. daily on Monday, Wednesday, Friday, and Sunday.  Take Lasix 80 mg p.o. daily on Tuesday, Thursday, and Saturday 07/14/18   Norval Morton, MD  isosorbide mononitrate (IMDUR) 30 MG 24 hr tablet Take 1 tablet (30 mg total) by mouth at bedtime. 07/14/18   Norval Morton, MD  losartan (COZAAR) 25 MG tablet Take 1 tablet (25 mg total) by mouth daily. 09/09/17 06/24/19  Johnson, Clanford L, MD  metoprolol succinate (TOPROL XL) 25 MG 24 hr tablet Take 1 tablet (25 mg total) by mouth daily. 07/21/18   Arnoldo Lenis, MD  nitroGLYCERIN (NITROSTAT) 0.4 MG SL tablet Place 1 tablet (0.4 mg total) under the tongue every 5 (five) minutes as needed for chest pain (CP or SOB). 07/14/18   Norval Morton, MD  oxyCODONE (OXY IR/ROXICODONE) 5 MG immediate release tablet Take 1 tablet (5 mg total) by mouth every 4 (four) hours as needed for moderate pain. 07/14/18   Norval Morton, MD  pantoprazole (PROTONIX) 40 MG tablet Take 1 tablet (40 mg total) by mouth daily. 07/15/18   Norval Morton, MD  potassium chloride (K-DUR,KLOR-CON) 20  MEQ tablet Take 1 tablet (20 mEq total) by mouth daily. 09/09/17 06/24/19  Murlean Iba, MD  rivaroxaban (XARELTO) 20 MG TABS tablet Take 1 tablet (20 mg total) by mouth daily with supper. 07/21/18   Arnoldo Lenis, MD  rosuvastatin (CRESTOR) 5 MG tablet Take 1 tablet (5 mg total) by mouth every other day. 07/21/18 10/19/18  Arnoldo Lenis, MD  tamsulosin (FLOMAX) 0.4 MG CAPS capsule Take 0.4 mg by mouth daily.     [provider]  vitamin B-12 (CYANOCOBALAMIN) 500 MCG tablet Take 1 tablet (500 mcg total) by mouth daily. 04/05/18   Orson Eva, MD    Inpatient Medications: Scheduled Meds: . [START ON 08/08/2018] pantoprazole  40 mg Intravenous Q12H   Continuous Infusions: . magnesium  sulfate 1 - 4 g bolus IVPB 2 g (08/05/18 0821)  . pantoprozole (PROTONIX) infusion 8 mg/hr (08/05/18 0537)   PRN Meds: acetaminophen, nitroGLYCERIN, prochlorperazine  Allergies:    Allergies  Allergen Reactions  . Atorvastatin     Severe leg pain/ache  . Penicillins     Has patient had a PCN reaction causing immediate rash, facial/tongue/throat swelling, SOB or lightheadedness with hypotension: Yes Has patient had a PCN reaction causing severe rash involving mucus membranes or skin necrosis: No Has patient had a PCN reaction that required hospitalization: No Has patient had a PCN reaction occurring within the last 10 years: No If all of the above answers are "NO", then may proceed with Cephalosporin use.    Social History:   Social History   Socioeconomic History  . Marital status: Married    Spouse name: Not on file  . Number of children: Not on file  . Years of education: Not on file  . Highest education level: Not on file  Occupational History  . Not on file  Social Needs  . Financial resource strain: Not on file  . Food insecurity:    Worry: Not on file    Inability: Not on file  . Transportation needs:    Medical: Not on file    Non-medical: Not on file  Tobacco Use  . Smoking status: Former Smoker    Types: Cigarettes    Last attempt to quit: 2006    Years since quitting: 14.3  . Smokeless tobacco: Never Used  Substance and Sexual Activity  . Alcohol use: No  . Drug use: No  . Sexual activity: Never  Lifestyle  . Physical activity:    Days per week: Not on file    Minutes per session: Not on file  . Stress: Not on file  Relationships  . Social connections:    Talks on phone: Not on file    Gets together: Not on file    Attends religious service: Not on file    Active member of club or organization: Not on file    Attends meetings of clubs or organizations: Not on file    Relationship status: Not on file  . Intimate partner violence:    Fear of  current or ex partner: Not on file    Emotionally abused: Not on file    Physically abused: Not on file    Forced sexual activity: Not on file  Other Topics Concern  . Not on file  Social History Narrative  . Not on file    Family History:    Family History  Problem Relation Age of Onset  . CVA Father   . CAD Father   . Diabetes  Sister   . Colon cancer Neg Hx      ROS:  Please see the history of present illness.   All other ROS reviewed and negative.     Physical Exam/Data:   Vitals:   08/05/18 0550 08/05/18 0600 08/05/18 0605 08/05/18 0828  BP: 120/70  115/67   Pulse: 91 98 100   Resp: (!) 24 (!) 21 (!) 22   Temp:      TempSrc:      SpO2: 100% 100% 100%   Weight:      Height:    6' (1.829 m)    Intake/Output Summary (Last 24 hours) at 08/05/2018 0838 Last data filed at 08/05/2018 0540 Gross per 24 hour  Intake 100 ml  Output -  Net 100 ml   Last 3 Weights 08/05/2018 07/21/2018 07/14/2018  Weight (lbs) 210 lb 210 lb 213 lb  Weight (kg) 95.255 kg 95.255 kg 96.616 kg     Body mass index is 28.48 kg/m.  General:  Elderly, chronically ill appearing WM, pale/ashy in color HEENT: normal Lymph: no adenopathy Neck: no JVD Endocrine:  No thryomegaly Vascular: No carotid bruits; FA pulses 2+ bilaterally without bruits  Cardiac:  normal S1, S2; RRR; gr 2/6 systolic murmur Lungs:  clear to auscultation bilaterally, no wheezing, rhonchi or rales  Abd: soft, nontender, no hepatomegaly  Ext: no edema Musculoskeletal:  No deformities, BUE and BLE strength normal and equal Skin: warm and dry  Neuro:  CNs 2-12 intact, no focal abnormalities noted Psych:  Normal affect   EKG:  The EKG was personally reviewed and demonstrates: Normal sinus rhythm with PACs and PVCs, possible posterior infarct vs anterolateral subendocardial injury. Telemetry:  Telemetry was personally reviewed and demonstrates: NSR with PVCs,PACs. Short run AIVR. 4-6 beat runs NSVT.  Relevant CV  Studies:  Echo 07/09/2018 FINDINGS  Left Ventricle: The left ventricle has mild-moderately reduced systolic function, with an ejection fraction of 40-45%. The cavity size was normal. There is mild asymmetric left ventricular hypertrophy. Left ventricular diastolic Doppler parameters are  consistent with impaired relaxation (grade I). Severe hypokinesis of the left ventricular, entire inferolateral wall.   Cath 07/14/2018  Significant three-vessel coronary artery disease.  Segmental 70% proximal to mid LAD.  This is an intermediate stenosis but would likely have positive FFR if tested.  Total occlusion of the second and third obtuse marginal branches, the sources of the patient's recent non-ST elevation myocardial infarction.  The LAD supplies collaterals to the third obtuse marginal which is a large of the 3 obtuse marginals.  85% proximal RCA.  Right dominant coronary anatomy.  No aortic valve gradient  Mildly elevated LVEDP  RECOMMENDATIONS:   Combination antiplatelet and antithrombotic therapy will place the patient at increased risk for recurrent bleeding. Requires combination therapy because of paroxysmal atrial flutter and significant underlying ischemic heart disease.  Would not recommend either until hemoglobin is stable and above 9 for at least 2 weeks.  Would avoid aspirin.  Perhaps low-dose rivaroxaban 2.5 mg twice daily and Plavix 75 mg/day would be his best option (based on the [facility] trial).  [facility] used aspirin, but this patient's endoscopy has suggested aspirin will be worse than Plavix as far as bleeding risk is concerned.  Low-dose rivaroxaban and Plavix has never been started as a stroke preventative measure.  No good data in this particular clinical subset.  Anti-ischemic therapy with beta-blocker and long-acting nitrates as tolerated by blood pressure.  Aggressive secondary risk factor modification including high intensity  statin therapy.  Will need clinical  cardiology and gastroenterology follow-up to coordinate care.  Cardiology follow-up should be within the next 4 weeks.  Hemoglobin in 2 and 4 weeks.  Laboratory Data:  Chemistry Recent Labs  Lab 08/05/18 0407  NA 139  K 3.4*  CL 101  CO2 26  GLUCOSE 195*  BUN 38*  CREATININE 0.93  CALCIUM 9.2  GFRNONAA >60  GFRAA >60  ANIONGAP 12    Recent Labs  Lab 08/05/18 0407  PROT 6.0*  ALBUMIN 3.4*  AST 17  ALT 15  ALKPHOS 60  BILITOT 0.4   Hematology Recent Labs  Lab 08/05/18 0407  WBC 7.6  RBC 1.95*  HGB 5.2*  HCT 18.1*  MCV 92.8  MCH 26.7  MCHC 28.7*  RDW 17.9*  PLT 221   Cardiac Enzymes Recent Labs  Lab 08/05/18 0407  TROPONINI 0.16*   No results for input(s): TROPIPOC in the last 168 hours.  BNP Recent Labs  Lab 08/05/18 0407  BNP 172.0*    DDimer No results for input(s): DDIMER in the last 168 hours.  Radiology/Studies:  Dg Chest Port 1 View  Result Date: 08/05/2018 CLINICAL DATA:  84 year old male with chest pain and shortness of breath. EXAM: PORTABLE CHEST 1 VIEW COMPARISON:  07/08/2018 and earlier. FINDINGS: Portable AP upright view at 0424 hours. Mildly improved lung volumes and regressed bilateral pulmonary interstitial opacity since March. Stable cardiac size and mediastinal contours. Visualized tracheal air column is within normal limits. No pneumothorax, consolidation or pleural effusion. Lung markings appear at baseline. Paucity of bowel gas in the upper abdomen. No acute osseous abnormality identified. IMPRESSION: Resolved abnormal pulmonary interstitial opacity since March. No acute cardiopulmonary abnormality. Electronically Signed   By: Genevie Ann M.D.   On: 08/05/2018 04:57    Assessment and Plan:   1. Chest pain: Concerning for acute coronary syndrome/demand ischemia in the setting of severe anemia.  Patient with known CAD with severe RCA stenosis. Occluded large OM and moderate LAD disease. He is not a candidate for intervention at this  point due to severe anemia and inability to acutely anticoagulate or give DAPT.  Will hold Plavix and Xarelto now. Once Hgb stable would resume Plavix only. Continue Toprol, Imdur, statin.   2. CAD: See recent cardiac catheterization report.  Ultimately will require intervention, however unable to do so at this time due to worsening anemia.  No aspirin  3. Symptomatic anemia: Recently had EGD and colonoscopy by GI, colonoscopy did not reveal any obvious bleeding source in the colon and distal ileum, EGD however revealed esophagitis and gastritis.  Given worsening anemia, will hold off on considering NOAC agent for atrial fibrillation. I think he is a poor candidate for long term anticoagulation at this point.  Currently on IV Protonix. Would reconsult GI. May want to consider capsule endoscopy. Unsure if bleeding scan would help at this point. Needs transfusion to Hgb> 9. Long term oral iron.   4. Ischemic cardiomyopathy: Recent echocardiogram showed EF down to 40 to 45%.  5. Paroxysmal atrial fibrillation: Coumadin was stopped during the recent cardiac catheterization.  It was plan to add 2.5 mg of Xarelto per Dr. Cheral Bay on the [facility] trial) if he is able to tolerate Plavix for several weeks.  Unfortunately he returned with significant anemia. I don't think he is a candidate for anticoagulation given major bleeding events x 2. Much more likely that he will die from bleeding especially in setting of CAD than to have CVA.  Could be considered for a LA occlusion device but event that would require a period of DAPT or anticoagulation.  6. NSVT: Beta-blocker on hold on initial arrival due to borderline blood pressure.  Once blood pressure improved, will restart rate control therapy.  Likely add low-dose Toprol-XL.  7. Hyperlipidemia: On low-dose Crestor at home, this will need to be uptitrated.  Intolerant to high-dose Lipitor.      For questions or updates, please contact [place] Please  consult [email or web address] for contact info under     Signed, Tamsin Nader [place] MD, [facility] 08/05/2018 10:37 AM

## 2018-08-05 NOTE — ED Notes (Signed)
First bag of blood rate change to 250 ml/hr second bag paused while giving lasix.

## 2018-08-05 NOTE — Significant Event (Addendum)
Called by [facility] to discuss care of Mr. Michael Stephens. His ECG meets criteria for a posterior STEMI with predominant R wave in V2-V3 with deep ST depressions and T-wave inversions, confirmed with ST elevation with right sided leads. On review of his chart, he had a recent LHC on 4/1 with 70% pLAD, 85% pRCA, and occluded OM2 and OM3. He did not undergo intervention due to significant anemia and was medically managed at that time. Endoscopy and colonoscopy did not show a clear source of bleeding. Unfortunately, his hemoglobin on arrival to the ER this evening is 5.2 indicating ongoing bleeding. His case was discussed with the interventional team at [facility] and we agreed the risks of catheterization and intervention outweigh the benefits due to his anemia/bleeding. He should be medically managed and transfused to hemoglobin >8 to increase his coronary perfusion. There are no plans for repeat cardiac catheterization. We have requested transfer to the hospitalist team for further work-up of his anemia and cardiology will consult.

## 2018-08-05 NOTE — Consult Note (Signed)
Referring Provider:  Dr. Karleen Hampshire Primary Care Physician:  [facility] Primary Gastroenterologist:  Dr. Laural Golden (Linna Hoff, [place])  Reason for Consultation: Recurrent anemia  HPI: Michael Stephens is a 84 y.o. male unassigned patient with history of atrial fibrillation previously on Coumadin, as well as coronary disease confirmed by cardiac catheterization on April 1 which showed significant disease for which revascularization was recommended once he was stable from the GI standpoint.   He was seen by Dr. Alessandra Bevels and Dr. Michail Sermon of our service in late March while we were on for unassigned call, because of anemia with hemoglobin 5.5, and heme positive stool.    Endoscopy was negative except for minimal esophagitis, and colonoscopy was negative except for diverticulosis and some small polyps, but no source of bleeding was found and the patient was discharged on Plavix with a hemoglobin of 8.5 and, approximately a week later, Xarelto was added to his regimen.    At that time, reportedly his hemoglobin was up to 10.1 although I do not have documentation of that.    He presented to the hospital today with weakness and shortness of breath and a hemoglobin of 5.2.  His BUN was elevated at 38, as compared to 14 at time of discharge 3 weeks earlier. He is not on any ulcerogenic medication other than possibly some Alka-Seltzer which contains aspirin; however, he was on Protonix as an outpatient prior to admission.  He does admit to some heartburn in the middle of the night around 3 AM most nights.  He has noticed dark green stools but no frank melena or overt bleeding.  He does not mention any other significant GI symptoms.   Past Medical History:  Diagnosis Date  . Acute diastolic CHF (congestive heart failure) ([place]) 02/11/2017  . Anemia    CHRONIC   . Atrial flutter ([place])    a. diagnosed in 02/2017. Rate-control strategy pursued.   Marland Kitchen BPH (benign prostatic hyperplasia)   . Chronic diastolic  heart failure ([place])   . Dyspnea   . Hypertension   . NSTEMI (non-ST elevated myocardial infarction) ([place])   . Renal disorder    cyst on kidney    Past Surgical History:  Procedure Laterality Date  . ABCESS DRAINAGE  11/2008   BUTTOCKS  . BIOPSY  07/13/2018   Procedure: BIOPSY;  Surgeon: Otis Brace, MD;  Location: [facility];  Service: Gastroenterology;;  . CATARACT EXTRACTION, BILATERAL    . COLONOSCOPY N/A 04/02/2017   Procedure: COLONOSCOPY;  Surgeon: Rogene Houston, MD;  [facility];  Service: Endoscopy;  Laterality: N/A;  10:55  . COLONOSCOPY WITH PROPOFOL N/A 07/13/2018   Procedure: COLONOSCOPY WITH PROPOFOL;  Surgeon: Otis Brace, MD;  Location: [place];  Service: Gastroenterology;  Laterality: N/A;  . ESOPHAGOGASTRODUODENOSCOPY (EGD) WITH PROPOFOL N/A 07/09/2018   Procedure: ESOPHAGOGASTRODUODENOSCOPY (EGD) WITH PROPOFOL;  Surgeon: Wilford Corner, MD;  Location: [facility];  Service: Endoscopy;  Laterality: N/A;  . LEFT HEART CATH AND CORONARY ANGIOGRAPHY N/A 07/14/2018   Procedure: LEFT HEART CATH AND CORONARY ANGIOGRAPHY;  Surgeon: Belva Crome, MD;  Location: [place] [facility];  Service: Cardiovascular;  Laterality: N/A;  . POLYPECTOMY  04/02/2017   Procedure: POLYPECTOMY;  Surgeon: Rogene Houston, MD;  [facility];  Service: Endoscopy;;  asceding colon(hot snare)/ sigmoid colon times two (cold snare)  . POLYPECTOMY  07/13/2018   Procedure: POLYPECTOMY;  Surgeon: Otis Brace, MD;  Location: [facility];  Service: Gastroenterology;;    Prior to Admission  medications   Medication Sig Start Date End Date Taking? Authorizing Provider  aspirin-sod bicarb-citric acid (ALKA-SELTZER) 325 MG TBEF tablet Take 325 mg by mouth every 6 (six) hours as needed.   Yes [provider]  budesonide-formoterol (SYMBICORT) 160-4.5 MCG/ACT inhaler Inhale 2 puffs into the lungs 2 (two) times daily.   Yes [provider]   clopidogrel (PLAVIX) 75 MG tablet Take 1 tablet (75 mg total) by mouth daily. 07/21/18  Yes Fuller Plan A, MD  finasteride (PROSCAR) 5 MG tablet Take 5 mg by mouth daily.   Yes [provider]  furosemide (LASIX) 80 MG tablet Take 0.5-1 tablets (40-80 mg total) by mouth daily. Take 40 mg p.o. daily on Monday, Wednesday, Friday, and Sunday.  Take Lasix 80 mg p.o. daily on Tuesday, Thursday, and Saturday 07/14/18  Yes Smith, Delbert Phenix, MD  Glucosamine-Chondroit-Vit C-Mn (GLUCOSAMINE 1500 COMPLEX) CAPS Take 1 capsule by mouth 2 (two) times daily.   Yes [provider]  isosorbide mononitrate (IMDUR) 30 MG 24 hr tablet Take 1 tablet (30 mg total) by mouth at bedtime. 07/14/18  Yes Fuller Plan A, MD  losartan (COZAAR) 25 MG tablet Take 1 tablet (25 mg total) by mouth daily. 09/09/17 06/24/19 Yes Johnson, Clanford L, MD  metoprolol succinate (TOPROL XL) 25 MG 24 hr tablet Take 1 tablet (25 mg total) by mouth daily. 07/21/18  Yes Branch, Alphonse Guild, MD  nitroGLYCERIN (NITROSTAT) 0.4 MG SL tablet Place 1 tablet (0.4 mg total) under the tongue every 5 (five) minutes as needed for chest pain (CP or SOB). 07/14/18  Yes Fuller Plan A, MD  oxyCODONE (OXY IR/ROXICODONE) 5 MG immediate release tablet Take 1 tablet (5 mg total) by mouth every 4 (four) hours as needed for moderate pain. 07/14/18  Yes Smith, Rondell A, MD  pantoprazole (PROTONIX) 40 MG tablet Take 1 tablet (40 mg total) by mouth daily. 07/15/18  Yes Smith, Eustaquio Boyden A, MD  potassium chloride (K-DUR,KLOR-CON) 20 MEQ tablet Take 1 tablet (20 mEq total) by mouth daily. 09/09/17 06/24/19 Yes Johnson, Clanford L, MD  rivaroxaban (XARELTO) 20 MG TABS tablet Take 1 tablet (20 mg total) by mouth daily with supper. 07/21/18  Yes BranchAlphonse Guild, MD  rosuvastatin (CRESTOR) 5 MG tablet Take 1 tablet (5 mg total) by mouth every other day. 07/21/18 10/19/18 Yes Branch, Alphonse Guild, MD  tamsulosin (FLOMAX) 0.4 MG CAPS capsule Take 0.4 mg by mouth daily.    Yes  [provider]  vitamin B-12 (CYANOCOBALAMIN) 500 MCG tablet Take 1 tablet (500 mcg total) by mouth daily. Patient not taking: Reported on 08/05/2018 04/05/18   Orson Eva, MD    Current Facility-Administered Medications  Medication Dose Route Frequency Provider Last Rate Last Dose  . acetaminophen (TYLENOL) tablet 650 mg  650 mg Oral Q4H PRN Reubin Milan, MD      . metoprolol succinate (TOPROL-XL) 24 hr tablet 25 mg  25 mg Oral Daily Martinique, Peter M, MD   25 mg at 08/05/18 1120  . nitroGLYCERIN (NITROSTAT) SL tablet 0.4 mg  0.4 mg Sublingual Q5 Min x 3 PRN Reubin Milan, MD      . pantoprazole (PROTONIX) 80 mg in sodium chloride 0.9 % 250 mL (0.32 mg/mL) infusion  8 mg/hr Intravenous Continuous Reubin Milan, MD 25 mL/hr at 08/05/18 0537 8 mg/hr at 08/05/18 0537  . [START ON 08/08/2018] pantoprazole (PROTONIX) injection 40 mg  40 mg Intravenous Q12H Reubin Milan, MD      .  prochlorperazine (COMPAZINE) injection 5 mg  5 mg Intravenous Q4H PRN Reubin Milan, MD      . rosuvastatin (CRESTOR) tablet 5 mg  5 mg Oral q1800 Martinique, Peter M, MD        Allergies as of 08/05/2018 - Review Complete 08/05/2018  Allergen Reaction Noted  . Atorvastatin  07/16/2018  . Penicillins  02/09/2017    Family History  Problem Relation Age of Onset  . CVA Father   . CAD Father   . Diabetes Sister   . Colon cancer Neg Hx     Social History   Socioeconomic History  . Marital status: Married    Spouse name: Not on file  . Number of children: Not on file  . Years of education: Not on file  . Highest education level: Not on file  Occupational History  . Not on file  Social Needs  . Financial resource strain: Not on file  . Food insecurity:    Worry: Not on file    Inability: Not on file  . Transportation needs:    Medical: Not on file    Non-medical: Not on file  Tobacco Use  . Smoking status: Former Smoker    Types: Cigarettes    Last attempt to quit:  2006    Years since quitting: 14.3  . Smokeless tobacco: Never Used  Substance and Sexual Activity  . Alcohol use: No  . Drug use: No  . Sexual activity: Not Currently  Lifestyle  . Physical activity:    Days per week: Not on file    Minutes per session: Not on file  . Stress: Not on file  Relationships  . Social connections:    Talks on phone: Not on file    Gets together: Not on file    Attends religious service: Not on file    Active member of club or organization: Not on file    Attends meetings of clubs or organizations: Not on file    Relationship status: Not on file  . Intimate partner violence:    Fear of current or ex partner: Not on file    Emotionally abused: Not on file    Physically abused: Not on file    Forced sexual activity: Not on file  Other Topics Concern  . Not on file  Social History Narrative  . Not on file    Review of Systems: GI symptoms per HPI.  Chest pain, shortness of breath, dark stools per HPI (some exposure to Pepto-Bismol which may account for the latter symptom).  Some mild lower extremity edema.  No skin rashes or obvious joint swelling, no cough, no urinary symptoms.  Physical Exam: Vital signs in last 24 hours: Temp:  [97.6 F (36.4 C)-98.3 F (36.8 C)] 97.6 F (36.4 C) (04/23 0828) Pulse Rate:  [81-104] 89 (04/23 0828) Resp:  [16-27] 22 (04/23 0605) BP: (99-121)/(52-76) 104/69 (04/23 0828) SpO2:  [94 %-100 %] 100 % (04/23 0828) Weight:  [93.4 kg-95.3 kg] 93.4 kg (04/23 0828) Last BM Date: 08/05/18 General:   Alert,  Well-developed, well-nourished, pleasant and cooperative in NAD lying on his side in bed, now status post transfusion of 2 units of packed cells. Head:  Normocephalic and atraumatic. Eyes:  Sclera clear, no icterus.   Lungs:  Clear anteriorly to auscultation.   No wheezes, crackles, or rhonchi. No evident respiratory distress. Heart:   Regular rate and rhythm; no murmurs, clicks, rubs,  or gallops. Abdomen: Somewhat  Adipose Rectal:  No masses.  Prostate not felt.  Ampulla is empty but the residue is very dark brown, not quite black, no odor of melena.  Specimen is being sent to the lab for guaiac analysis. Msk:   Symmetrical without gross deformities. Extremities:   Possible minimal tibial edema Neurologic:  Alert and coherent;  grossly normal neurologically. Skin:  Intact without significant lesions or rashes. Psych:   Alert and cooperative. Normal mood and affect.  Intake/Output from previous day: 04/22 0701 - 04/23 0700 In: 100 [IV Piggyback:100] Out: -  Intake/Output this shift: No intake/output data recorded.  Lab Results: Recent Labs    08/05/18 0407  WBC 7.6  HGB 5.2*  HCT 18.1*  PLT 221   BMET Recent Labs    08/05/18 0407  NA 139  K 3.4*  CL 101  CO2 26  GLUCOSE 195*  BUN 38*  CREATININE 0.93  CALCIUM 9.2   LFT Recent Labs    08/05/18 0407  PROT 6.0*  ALBUMIN 3.4*  AST 17  ALT 15  ALKPHOS 60  BILITOT 0.4   PT/INR No results for input(s): LABPROT, INR in the last 72 hours.  Studies/Results: Dg Chest Port 1 View  Result Date: 08/05/2018 CLINICAL DATA:  84 year old male with chest pain and shortness of breath. EXAM: PORTABLE CHEST 1 VIEW COMPARISON:  07/08/2018 and earlier. FINDINGS: Portable AP upright view at 0424 hours. Mildly improved lung volumes and regressed bilateral pulmonary interstitial opacity since March. Stable cardiac size and mediastinal contours. Visualized tracheal air column is within normal limits. No pneumothorax, consolidation or pleural effusion. Lung markings appear at baseline. Paucity of bowel gas in the upper abdomen. No acute osseous abnormality identified. IMPRESSION: Resolved abnormal pulmonary interstitial opacity since March. No acute cardiopulmonary abnormality. Electronically Signed   By: Genevie Ann M.D.   On: 08/05/2018 04:57    Impression: 1.  Recurrent profound symptomatic anemia, presumably due to recurrent GI tract blood loss,  while on antiplatelet therapy, some aspirin exposure (Alka-Seltzer), and anticoagulation.  Recent endoscopy and colonoscopy unrevealing.  2.  History of atrial fibrillation (most recently in sinus rhythm), with significant coronary disease awaiting revascularization.  Plan: Capsule endoscopy tomorrow.  Rationale discussed with patient, who is agreeable.  Further management to depend on those findings, which will hopefully be available Saturday or Sunday.  I will follow at a distance.  Please call in the meantime if earlier input from me is desired.   LOS: 0 days   Youlanda Mighty Jaquis Picklesimer  08/05/2018, 2:31 PM   Pager [phone] If no answer or after 5 PM call [phone]

## 2018-08-05 NOTE — ED Notes (Signed)
CRITICAL VALUE ALERT  Critical Value:  Trop 0.16 Date & Time Notied:  8372 08/05/2018  Provider Notified: Dr. Betsey Holiday  Orders Received/Actions taken: See chart

## 2018-08-05 NOTE — ED Notes (Signed)
Date and time results received: 08/05/18 04:22 (use smartphrase ".now" to insert current time)  Test: hemoglobin Critical Value: 5.2  Name of Provider Notified:  MD. Betsey Holiday  Orders Received? Or Actions Taken?: Provider notified

## 2018-08-05 NOTE — H&P (Signed)
History and Physical    Michael Stephens WNU:[id number] DOB: 02-25-1935 DOA: 08/05/2018  PCP: [facility]   Patient coming from: Home.  I have personally briefly reviewed patient's old medical records in [place]  Chief Complaint: Chest pain.  HPI: Michael Stephens is a 84 y.o. male with medical history significant of chronic combined systolic and diastolic CHF, atrial flutter, hypertension, BPH, renal cyst, chronic anemia, CAD, recent NSTEMI with diagnostic cath treated medically who is coming to the emergency department due to chest pain that woke him up from sleep about an hour to come into the emergency department.  He describes the pain as precordial, sharp, radiated to both of his arms initially, now radiated to his right arm associated with dyspnea, but denies nausea, emesis, diaphoresis or palpitations.  He states that he took some Alka-Seltzer severe with no significant results.  He denies fever, sore throat, dyspnea, wheezing or hemoptysis.  He denies abdominal pain, diarrhea, constipation or hematochezia.  He had recent melena, but he attributes that to his use of Pepto-Bismol.  No dysuria, frequency or hematuria.  Denies polyuria, polydipsia, polyphagia or blurred vision.  ED Course: Initial vital signs in the emergency department temperature 98.3 F, pulse 81, respirations 22, blood pressure 112/76 mmHg and O2 sat 100% on room air.  The patient was given 2 units of PRBC, furosemide 20 mg IVP pretransfusion and was started on a continuous Protonix infusion after initial bolus.  His white count 7.6, hemoglobin 5.2 g/dL and platelets 221.  Serial EKGs have shown significant ischemic changes as listed below (please see tracings for further details) troponin was 0.16 ng/mL.  BNP was 172.0 pg/mL.  CMP shows a potassium of 3.4 mmol/L.  Glucose 195, BUN 38 and creatinine 0.93 mg/dL.  Total protein 6.0 and albumin 3.4 g/dL.  All other CMP values are within expected limits.  His  chest radiograph shows no acute cardiopulmonary abnormality.  Review of Systems: As per HPI otherwise 10 point review of systems negative.   Past Medical History:  Diagnosis Date  . Acute diastolic CHF (congestive heart failure) ([place]) 02/11/2017  . Atrial flutter ([place])    a. diagnosed in 02/2017. Rate-control strategy pursued.   Marland Kitchen BPH (benign prostatic hyperplasia)   . Chronic diastolic heart failure ([place])   . Hypertension   . Renal disorder    cyst on kidney    Past Surgical History:  Procedure Laterality Date  . ABCESS DRAINAGE  11/2008   BUTTOCKS  . BIOPSY  07/13/2018   Procedure: BIOPSY;  Surgeon: Otis Brace, MD;  Location: [facility];  Service: Gastroenterology;;  . CATARACT EXTRACTION, BILATERAL    . COLONOSCOPY N/A 04/02/2017   Procedure: COLONOSCOPY;  Surgeon: Rogene Houston, MD;  Location: [facility];  Service: Endoscopy;  Laterality: N/A;  10:55  . COLONOSCOPY WITH PROPOFOL N/A 07/13/2018   Procedure: COLONOSCOPY WITH PROPOFOL;  Surgeon: Otis Brace, MD;  Location: [facility];  Service: Gastroenterology;  Laterality: N/A;  . ESOPHAGOGASTRODUODENOSCOPY (EGD) WITH PROPOFOL N/A 07/09/2018   Procedure: ESOPHAGOGASTRODUODENOSCOPY (EGD) WITH PROPOFOL;  Surgeon: Wilford Corner, MD;  Location: [place] [facility];  Service: Endoscopy;  Laterality: N/A;  . LEFT HEART CATH AND CORONARY ANGIOGRAPHY N/A 07/14/2018   Procedure: LEFT HEART CATH AND CORONARY ANGIOGRAPHY;  Surgeon: Belva Crome, MD;  Location: [facility];  Service: Cardiovascular;  Laterality: N/A;  . POLYPECTOMY  04/02/2017   Procedure: POLYPECTOMY;  Surgeon: Rogene Houston, MD;  [facility];  Service: Endoscopy;;  asceding colon(hot snare)/ sigmoid colon times two (cold snare)  . POLYPECTOMY  07/13/2018   Procedure: POLYPECTOMY;  Surgeon: Otis Brace, MD;  Location: [facility];  Service: Gastroenterology;;     reports that he quit smoking about 14 years ago. His smoking use  included cigarettes. He has never used smokeless tobacco. He reports that he does not drink alcohol or use drugs.  Allergies  Allergen Reactions  . Atorvastatin     Severe leg pain/ache  . Penicillins     Has patient had a PCN reaction causing immediate rash, facial/tongue/throat swelling, SOB or lightheadedness with hypotension: Yes Has patient had a PCN reaction causing severe rash involving mucus membranes or skin necrosis: No Has patient had a PCN reaction that required hospitalization: No Has patient had a PCN reaction occurring within the last 10 years: No If all of the above answers are "NO", then may proceed with Cephalosporin use.    Family History  Problem Relation Age of Onset  . CVA Father   . CAD Father   . Diabetes Sister   . Colon cancer Neg Hx    Prior to Admission medications   Medication Sig Start Date End Date Taking? Authorizing Provider  budesonide-formoterol (SYMBICORT) 160-4.5 MCG/ACT inhaler Inhale 2 puffs into the lungs 2 (two) times daily.    [provider]  clopidogrel (PLAVIX) 75 MG tablet Take 1 tablet (75 mg total) by mouth daily. 07/21/18   Norval Morton, MD  finasteride (PROSCAR) 5 MG tablet Take 5 mg by mouth daily.    [provider]  furosemide (LASIX) 80 MG tablet Take 0.5-1 tablets (40-80 mg total) by mouth daily. Take 40 mg p.o. daily on Monday, Wednesday, Friday, and Sunday.  Take Lasix 80 mg p.o. daily on Tuesday, Thursday, and Saturday 07/14/18   Norval Morton, MD  isosorbide mononitrate (IMDUR) 30 MG 24 hr tablet Take 1 tablet (30 mg total) by mouth at bedtime. 07/14/18   Norval Morton, MD  losartan (COZAAR) 25 MG tablet Take 1 tablet (25 mg total) by mouth daily. 09/09/17 06/24/19  Johnson, Clanford L, MD  metoprolol succinate (TOPROL XL) 25 MG 24 hr tablet Take 1 tablet (25 mg total) by mouth daily. 07/21/18   Arnoldo Lenis, MD  nitroGLYCERIN (NITROSTAT) 0.4 MG SL tablet Place 1 tablet (0.4 mg total) under the tongue  every 5 (five) minutes as needed for chest pain (CP or SOB). 07/14/18   Norval Morton, MD  oxyCODONE (OXY IR/ROXICODONE) 5 MG immediate release tablet Take 1 tablet (5 mg total) by mouth every 4 (four) hours as needed for moderate pain. 07/14/18   Norval Morton, MD  pantoprazole (PROTONIX) 40 MG tablet Take 1 tablet (40 mg total) by mouth daily. 07/15/18   Norval Morton, MD  potassium chloride (K-DUR,KLOR-CON) 20 MEQ tablet Take 1 tablet (20 mEq total) by mouth daily. 09/09/17 06/24/19  Murlean Iba, MD  rivaroxaban (XARELTO) 20 MG TABS tablet Take 1 tablet (20 mg total) by mouth daily with supper. 07/21/18   Arnoldo Lenis, MD  rosuvastatin (CRESTOR) 5 MG tablet Take 1 tablet (5 mg total) by mouth every other day. 07/21/18 10/19/18  Arnoldo Lenis, MD  tamsulosin (FLOMAX) 0.4 MG CAPS capsule Take 0.4 mg by mouth daily.     [provider]  vitamin B-12 (CYANOCOBALAMIN) 500 MCG tablet Take 1 tablet (500 mcg total) by mouth daily. 04/05/18   Orson Eva, MD    Physical Exam: Vitals:  08/05/18 0510 08/05/18 0515 08/05/18 0520 08/05/18 0530  BP: (!) 108/55   121/67  Pulse: (!) 104 100 99 98  Resp: (!) 27 (!) 25 (!) 24 16  Temp:      TempSrc:      SpO2: 95% 97% 94% 100%  Weight:      Height:        Constitutional: NAD, calm, comfortable Eyes: PERRL, lids and conjunctivae look pale. ENMT: Mucous membranes are moist. Posterior pharynx clear of any exudate or lesions. Neck: normal, supple, no masses, no thyromegaly Respiratory: clear to auscultation bilaterally, no wheezing, no crackles. Normal respiratory effort. No accessory muscle use.  Cardiovascular: Regular rate and rhythm, no murmurs / rubs / gallops. No extremity edema. 2+ pedal pulses. No carotid bruits.  Abdomen: Obese, soft, no tenderness, no masses palpated. No hepatosplenomegaly. Bowel sounds positive.  Musculoskeletal: no clubbing / cyanosis. Good ROM, no contractures. Normal muscle tone.  Skin: no rashes,  lesions, ulcers on limited dermatological examination. Neurologic: CN 2-12 grossly intact. Sensation intact, DTR normal. Strength 5/5 in all 4.  Psychiatric: Normal judgment and insight. Alert and oriented x 3. Normal mood.   Labs on Admission: I have personally reviewed following labs and imaging studies  CBC: Recent Labs  Lab 08/05/18 0407  WBC 7.6  NEUTROABS 4.0  HGB 5.2*  HCT 18.1*  MCV 92.8  PLT 767   Basic Metabolic Panel: Recent Labs  Lab 08/05/18 0407  NA 139  K 3.4*  CL 101  CO2 26  GLUCOSE 195*  BUN 38*  CREATININE 0.93  CALCIUM 9.2  MG 2.1  PHOS 3.3   GFR: Estimated Creatinine Clearance: 73.4 mL/min (by C-G formula based on SCr of 0.93 mg/dL). Liver Function Tests: Recent Labs  Lab 08/05/18 0407  AST 17  ALT 15  ALKPHOS 60  BILITOT 0.4  PROT 6.0*  ALBUMIN 3.4*   No results for input(s): LIPASE, AMYLASE in the last 168 hours. No results for input(s): AMMONIA in the last 168 hours. Coagulation Profile: No results for input(s): INR, PROTIME in the last 168 hours. Cardiac Enzymes: Recent Labs  Lab 08/05/18 0407  TROPONINI 0.16*   BNP (last 3 results) No results for input(s): PROBNP in the last 8760 hours. HbA1C: No results for input(s): HGBA1C in the last 72 hours. CBG: No results for input(s): GLUCAP in the last 168 hours. Lipid Profile: No results for input(s): CHOL, HDL, LDLCALC, TRIG, CHOLHDL, LDLDIRECT in the last 72 hours. Thyroid Function Tests: No results for input(s): TSH, T4TOTAL, FREET4, T3FREE, THYROIDAB in the last 72 hours. Anemia Panel: No results for input(s): VITAMINB12, FOLATE, FERRITIN, TIBC, IRON, RETICCTPCT in the last 72 hours. Urine analysis:    Component Value Date/Time   COLORURINE YELLOW 11/06/2017 0936   APPEARANCEUR HAZY (A) 11/06/2017 0936   LABSPEC 1.010 11/06/2017 0936   PHURINE 6.0 11/06/2017 0936   GLUCOSEU NEGATIVE 11/06/2017 0936   HGBUR SMALL (A) 11/06/2017 0936   BILIRUBINUR NEGATIVE 11/06/2017  0936   KETONESUR NEGATIVE 11/06/2017 0936   PROTEINUR NEGATIVE 11/06/2017 0936   NITRITE POSITIVE (A) 11/06/2017 0936   LEUKOCYTESUR LARGE (A) 11/06/2017 0936    Radiological Exams on Admission: Dg Chest Port 1 View  Result Date: 08/05/2018 CLINICAL DATA:  84 year old male with chest pain and shortness of breath. EXAM: PORTABLE CHEST 1 VIEW COMPARISON:  07/08/2018 and earlier. FINDINGS: Portable AP upright view at 0424 hours. Mildly improved lung volumes and regressed bilateral pulmonary interstitial opacity since March. Stable cardiac size and mediastinal  contours. Visualized tracheal air column is within normal limits. No pneumothorax, consolidation or pleural effusion. Lung markings appear at baseline. Paucity of bowel gas in the upper abdomen. No acute osseous abnormality identified. IMPRESSION: Resolved abnormal pulmonary interstitial opacity since March. No acute cardiopulmonary abnormality. Electronically Signed   By: Genevie Ann M.D.   On: 08/05/2018 04:57   07/09/2018 echo Indications:    Abnormal ECG 794.31 / R94.31   History:        Patient has prior history of Echocardiogram examinations, most                 recent 04/01/2018. Atrial Fibrillation, Atrial Flutter,                 Hypertension, Demand Ischemia, Anemia, Congestive Heart Failure,                 NSTEMI, Respritory Failure with Hypomenia, Pneumonia, Aortic                 Stenosis.   Sonographer:    Madelaine Etienne RDCS (AE) Referring Phys: [id number] RIPUDEEP K RAI  IMPRESSIONS    1. Severe hypokinesis of the left ventricular, entire inferolateral wall.  2. The left ventricle has mild-moderately reduced systolic function, with an ejection fraction of 40-45%. The cavity size was normal. There is mild asymmetric left ventricular hypertrophy. Left ventricular diastolic Doppler parameters are consistent  with impaired relaxation.  3. The right ventricle has normal systolc function. The cavity was normal. There is no  increase in right ventricular wall thickness. Right ventricular systolic pressure is moderately elevated with an estimated pressure of 63.7 mmHg.  4. Left atrial size was moderately dilated.  5. Right atrial size was moderately dilated.  6. The mitral valve is myxomatous. Mild thickening of the mitral valve leaflet.  7. The aortic valve is tricuspid Mild thickening of the aortic valve Mild calcification of the aortic valve. Aortic valve regurgitation is trivial by color flow Doppler.  8. There is mild dilatation of the aortic root measuring 40 mm.  9. The inferior vena cava was dilated in size with >50% respiratory variability. 10. No intracardiac thrombi or masses were visualized.   07/15/2018  LEFT HEART CATH AND CORONARY ANGIOGRAPHY   Conclusion    Significant three-vessel coronary artery disease.  Segmental 70% proximal to mid LAD.  This is an intermediate stenosis but would likely have positive FFR if tested.  Total occlusion of the second and third obtuse marginal branches, the sources of the patient's recent non-ST elevation myocardial infarction.  The LAD supplies collaterals to the third obtuse marginal which is a large of the 3 obtuse marginals.  85% proximal RCA.  Right dominant coronary anatomy.  No aortic valve gradient  Mildly elevated LVEDP  RECOMMENDATIONS:   Combination antiplatelet and antithrombotic therapy will place the patient at increased risk for recurrent bleeding. Requires combination therapy because of paroxysmal atrial flutter and significant underlying ischemic heart disease.  Would not recommend either until hemoglobin is stable and above 9 for at least 2 weeks.  Would avoid aspirin.  Perhaps low-dose rivaroxaban 2.5 mg twice daily and Plavix 75 mg/day would be his best option (based on the [facility] trial).  [facility] used aspirin, but this patient's endoscopy has suggested aspirin will be worse than Plavix as far as bleeding risk is concerned.  Low-dose  rivaroxaban and Plavix has never been started as a stroke preventative measure.  No good data in this particular clinical subset.  Anti-ischemic  therapy with beta-blocker and long-acting nitrates as tolerated by blood pressure.  Aggressive secondary risk factor modification including high intensity statin therapy.  Will need clinical cardiology and gastroenterology follow-up to coordinate care.  Cardiology follow-up should be within the next 4 weeks.  Hemoglobin in 2 and 4 weeks.     EKG: Independently reviewed. EKG#1 Vent. rate 98 BPM PR interval * ms QRS duration 118 ms QT/QTc 311/397 ms P-R-T axes 175 39 184 Sinus or ectopic atrial rhythm Atrial premature complex Nonspecific intraventricular conduction delay Consider posterior infarct Repol abnrm, severe global ischemia (LM/MVD)  EKG#2 Vent. rate 101 BPM PR interval * ms QRS duration 109 ms QT/QTc 303/393 ms P-R-T axes 17 44 165 Sinus tachycardia Multiple ventricular premature complexes Prolonged PR interval Posterior infarct, acute (LCx) Abnormal lateral Q waves ST depression V1-V3, suggest recording posterior leads Baseline wander in lead(s) V5 V6  EKG #3 Vent. rate 106 BPM PR interval * ms QRS duration 113 ms QT/QTc 317/421 ms P-R-T axes * 34 182 Atrial fibrillation Borderline intraventricular conduction delay Repol abnrm, severe global ischemia (LM/MVD  Assessment/Plan Principal Problem:   Acute coronary syndrome (HCC) Admit to progressive unit/inpatient. Continue supplemental oxygen. Continue nitroglycerin as needed for chest pain. Continue PRBC transfusion. Holding beta-blocker, antiplatelet and anticoagulation. Trend troponin levels. Serial EKGs. Cardiology evaluation once he arrives to [facility].  Active Problems:   Symptomatic anemia Transfused 2 units of PRBC. Continue Protonix infusion. Monitor hematocrit and hemoglobin. Transfuse again as needed. Consider GI evaluation.     Hypokalemia Replacing. Supplemental magnesium.    Hyperglycemia Monitor blood glucose. Check hemoglobin A1c.    AF (paroxysmal atrial fibrillation) (HCC) CHA?DS?-VASc Score of at least 5. Xarelto has been held. Beta-blocker held due to soft blood pressure.    Chronic diastolic CHF (congestive heart failure) (HCC) No significant signs of decompensation at this time. His symptoms are mostly from hypovolemia secondary to anemia. Clinical status should improve with transfusion.    Essential hypertension Holding antihypertensives in the setting of symptomatic anemia. Monitor blood pressure.    BPH (benign prostatic hyperplasia) Currently n.p.o. Resume finasteride and tamsulosin once cleared for oral intake.    DVT prophylaxis: On Xarelto at home. Held due to bleeding. SCDs. Code Status: Full code. Family Communication:  Disposition Plan: Admit to [facility] for further work up and treatment. Consults called: Cardiology discussed the case with Dr. Waverly Ferrari and requested transfer to [facility] Admission status: Inpatient/Progressive unit.   Reubin Milan MD [facility]  08/05/2018, 5:57 AM   This document was prepared using Dragon voice recognition software and may contain some unintended transcription errors.

## 2018-08-05 NOTE — ED Triage Notes (Signed)
Pt reports chest pain from sternum to abdomen described as pressure and bloating. Pt reports some relief after taking alka- seltzer and belching. Pt also reports sob and both arms aching.

## 2018-08-05 NOTE — ED Notes (Signed)
PRBC bag #2 restarted at 285ml/hr

## 2018-08-05 NOTE — ED Notes (Signed)
#   1 bag PRBC emergency release blood complete

## 2018-08-06 ENCOUNTER — Encounter (HOSPITAL_COMMUNITY)
Admission: EM | Disposition: A | Discharge status: Home / Self Care | Payer: Self-pay | Source: Home / Self Care | Attending: Internal Medicine

## 2018-08-06 DIAGNOSIS — E876 Hypokalemia: Secondary | ICD-10-CM

## 2018-08-06 HISTORY — PX: GIVENS CAPSULE STUDY: SHX5432

## 2018-08-06 LAB — BPAM RBC
Blood Product Expiration Date: 202005112359
Blood Product Expiration Date: 202005122359
ISSUE DATE / TIME: 202004230440
ISSUE DATE / TIME: 202004230440
Unit Type and Rh: 9500
Unit Type and Rh: 9500

## 2018-08-06 LAB — TYPE AND SCREEN
ABO/RH(D): O NEG
Antibody Screen: NEGATIVE
Unit division: 0
Unit division: 0

## 2018-08-06 LAB — BASIC METABOLIC PANEL
Anion gap: 12 (ref 5–15)
BUN: 28 mg/dL — ABNORMAL HIGH (ref 8–23)
CO2: 23 mmol/L (ref 22–32)
Calcium: 8.9 mg/dL (ref 8.9–10.3)
Chloride: 103 mmol/L (ref 98–111)
Creatinine, Ser: 0.86 mg/dL (ref 0.61–1.24)
GFR calc Af Amer: >60 mL/min (ref >60)
GFR calc non Af Amer: >60 mL/min (ref >60)
Glucose, Bld: 106 mg/dL — ABNORMAL HIGH (ref 70–99)
Potassium: 4.3 mmol/L (ref 3.5–5.1)
Sodium: 138 mmol/L (ref 135–145)

## 2018-08-06 LAB — CBC
HCT: 21.4 % — ABNORMAL LOW (ref 39.0–52.0)
Hemoglobin: 6.8 g/dL — CL (ref 13.0–17.0)
MCH: 27.8 pg (ref 26.0–34.0)
MCHC: 31.8 g/dL (ref 30.0–36.0)
MCV: 87.3 fL (ref 80.0–100.0)
Platelets: 169 10*3/uL (ref 150–400)
RBC: 2.45 MIL/uL — ABNORMAL LOW (ref 4.22–5.81)
RDW: 16.5 % — ABNORMAL HIGH (ref 11.5–15.5)
WBC: 9.7 10*3/uL (ref 4.0–10.5)
nRBC: 0.4 % — ABNORMAL HIGH (ref 0.0–0.2)

## 2018-08-06 LAB — HEMOGLOBIN AND HEMATOCRIT, BLOOD
HCT: 24.8 % — ABNORMAL LOW (ref 39.0–52.0)
Hemoglobin: 7.9 g/dL — ABNORMAL LOW (ref 13.0–17.0)

## 2018-08-06 SURGERY — IMAGING PROCEDURE, GI TRACT, INTRALUMINAL, VIA CAPSULE
Anesthesia: LOCAL

## 2018-08-06 MED ORDER — ISOSORBIDE MONONITRATE ER 30 MG PO TB24
30.0000 mg | ORAL_TABLET | Freq: Every day | ORAL | Status: DC
  Administered 2018-08-06 – 2018-08-09 (×3): 30 mg via ORAL
  Filled 2018-08-06 (×4): qty 1

## 2018-08-06 MED ORDER — ALUM & MAG HYDROXIDE-SIMETH 200-200-20 MG/5ML PO SUSP
30.0000 mL | ORAL | Status: DC | PRN
  Filled 2018-08-06 (×2): qty 30

## 2018-08-06 MED ORDER — FUROSEMIDE 10 MG/ML IJ SOLN
40.0000 mg | Freq: Once | INTRAMUSCULAR | Status: AC
  Administered 2018-08-06: 07:00:00 40 mg via INTRAVENOUS
  Filled 2018-08-06: qty 4

## 2018-08-06 SURGICAL SUPPLY — 1 items: TOWEL COTTON PACK 4EA (MISCELLANEOUS) ×4 IMPLANT

## 2018-08-06 NOTE — Progress Notes (Signed)
PROGRESS NOTE    Michael Stephens  ASN:[id number] DOB: 1935-03-04 DOA: 08/05/2018 PCP: [facility], [facility]    Brief Narrative:  Michael Stephens a 84 y.o.malewith medical history significant ofchronic combined systolic and diastolic CHF, atrial flutter, hypertension, BPH, renal cyst, chronic anemia, CAD, recent NSTEMI with diagnostic cath treated medically who is coming to the emergency department due to chest pain that woke him up from sleep prior coming to ED. Pt had recent cath done and he was discharged on  xarelto and plavix. He also underwent EGD and colonoscopy which were unrevealing for anemia of blood loss.  This admission he was found to have profound anemia with a hemoglobin of 5.2. Cardiology and GI consulted.  He underwent  A total of 4 units prbc transfusion and to keep his hemoglobin greater than 8. Waiting for repeat H&h post transfusion.    Assessment & Plan:   Principal Problem:   Acute coronary syndrome (HCC) Active Problems:   Essential hypertension   BPH (benign prostatic hyperplasia)   AF (paroxysmal atrial fibrillation) (HCC)   Chronic diastolic CHF (congestive heart failure) (HCC)   Symptomatic anemia   Hypokalemia   Hyperglycemia   Acute coronary syndrome: Holding plavix and xarelto for profound anemia and possible GI bleed.  Cardiology on board and appreciate recommendations.  He currently denies any chest pain or sob.  EKG  Today shows Sinus rhythm with Premature atrial complexes ST & T wave abnormality, consider anterolateral ischemia     Symptomatic anemia Pt underwent EGD and colonoscopy earlier this month Dr Alessandra Bevels, which were unrevealing.  He was admitted with a hemoglobin of 5.2 and received about 4 units of prbc transfusion and his repeat H&H is pending.  GI consulted Dr Cristina Gong recommended capsule endoscopy today to complete the work up and no plans to repeat EGD.     Hypokalemia:  Replaced.     Paroxysmal atrial  fibrillation.  Rate controlled.    Chronic diastolic heart failure:  He appears to be compensated.    Hypertension:  bp parameters continue to be borderline.  Resume imdur and BB     Hyperlipidemia:  Resume crestor.   DVT prophylaxis: scd's Code Status: full code.  Family Communication: none at bedside.  Disposition Plan: pending further work up .    Consultants:   Cardiology   Gastroenterology.   Procedures:  Capsule endoscopy today  Antimicrobials: none.   Subjective: No chest pain or sob.   Objective: Vitals:   08/06/18 0415 08/06/18 0459 08/06/18 0828 08/06/18 0907  BP: 101/64   119/71  Pulse: 85     Resp: (!) 24     Temp: 98.8 F (37.1 C)     TempSrc: Oral     SpO2: 96%     Weight:  92.9 kg 92.9 kg   Height:   6' (1.829 m)     Intake/Output Summary (Last 24 hours) at 08/06/2018 1126 Last data filed at 08/06/2018 0900 Gross per 24 hour  Intake 1166.44 ml  Output 1150 ml  Net 16.44 ml   Filed Weights   08/05/18 0828 08/06/18 0459 08/06/18 0828  Weight: 93.4 kg 92.9 kg 92.9 kg    Examination:  General exam: Appears calm and comfortable  Respiratory system: Clear to auscultation. Respiratory effort normal. Cardiovascular system: S1 & S2 heard, irregular, .No pedal edema. Gastrointestinal system: Abdomen is nondistended, soft and nontender. No organomegaly or masses felt. Normal bowel sounds heard. Central nervous system: Alert and oriented. No focal neurological deficits. Extremities:  Symmetric 5 x 5 power. Skin: No rashes, lesions or ulcers Psychiatry: J Mood & affect appropriate.     Data Reviewed: I have personally reviewed following labs and imaging studies  CBC: Recent Labs  Lab 08/05/18 0407 08/05/18 1433 08/06/18 0050 08/06/18 0933  WBC 7.6 9.9 9.7  --   NEUTROABS 4.0  --   --   --   HGB 5.2* 6.7* 6.8* 7.9*  HCT 18.1* 22.1* 21.4* 24.8*  MCV 92.8 87.0 87.3  --   PLT 221 187 169  --    Basic Metabolic Panel: Recent Labs   Lab 08/05/18 0407 08/05/18 1433 08/06/18 0050  NA 139  --  138  K 3.4* 4.1 4.3  CL 101  --  103  CO2 26  --  23  GLUCOSE 195*  --  106*  BUN 38*  --  28*  CREATININE 0.93  --  0.86  CALCIUM 9.2  --  8.9  MG 2.1  --   --   PHOS 3.3  --   --    GFR: Estimated Creatinine Clearance: 72.7 mL/min (by C-G formula based on SCr of 0.86 mg/dL). Liver Function Tests: Recent Labs  Lab 08/05/18 0407  AST 17  ALT 15  ALKPHOS 60  BILITOT 0.4  PROT 6.0*  ALBUMIN 3.4*   No results for input(s): LIPASE, AMYLASE in the last 168 hours. No results for input(s): AMMONIA in the last 168 hours. Coagulation Profile: No results for input(s): INR, PROTIME in the last 168 hours. Cardiac Enzymes: Recent Labs  Lab 08/05/18 0407  TROPONINI 0.16*   BNP (last 3 results) No results for input(s): PROBNP in the last 8760 hours. HbA1C: Recent Labs    08/05/18 0815  HGBA1C 5.2   CBG: No results for input(s): GLUCAP in the last 168 hours. Lipid Profile: No results for input(s): CHOL, HDL, LDLCALC, TRIG, CHOLHDL, LDLDIRECT in the last 72 hours. Thyroid Function Tests: No results for input(s): TSH, T4TOTAL, FREET4, T3FREE, THYROIDAB in the last 72 hours. Anemia Panel: No results for input(s): VITAMINB12, FOLATE, FERRITIN, TIBC, IRON, RETICCTPCT in the last 72 hours. Sepsis Labs: No results for input(s): PROCALCITON, LATICACIDVEN in the last 168 hours.  Recent Results (from the past 240 hour(s))  MRSA PCR Screening     Status: None   Collection Time: 08/05/18  8:43 AM  Result Value Ref Range Status   MRSA by PCR NEGATIVE NEGATIVE Final    Comment:        The GeneXpert MRSA Assay (FDA approved for NASAL specimens only), is one component of a comprehensive MRSA colonization surveillance program. It is not intended to diagnose MRSA infection nor to guide or monitor treatment for MRSA infections. Performed at [facility], [address], [place], [place] [address]           Radiology Studies: Dg Chest [place] 1 View  Result Date: 08/05/2018 CLINICAL DATA:  84 year old male with chest pain and shortness of breath. EXAM: PORTABLE CHEST 1 VIEW COMPARISON:  07/08/2018 and earlier. FINDINGS: Portable AP upright view at 0424 hours. Mildly improved lung volumes and regressed bilateral pulmonary interstitial opacity since March. Stable cardiac size and mediastinal contours. Visualized tracheal air column is within normal limits. No pneumothorax, consolidation or pleural effusion. Lung markings appear at baseline. Paucity of bowel gas in the upper abdomen. No acute osseous abnormality identified. IMPRESSION: Resolved abnormal pulmonary interstitial opacity since March. No acute cardiopulmonary abnormality. Electronically Signed   By: Herminio Heads.D.  On: 08/05/2018 04:57        Scheduled Meds: . sodium chloride   Intravenous Once  . isosorbide mononitrate  30 mg Oral Daily  . metoprolol succinate  25 mg Oral Daily  . [START ON 08/08/2018] pantoprazole  40 mg Intravenous Q12H  . rosuvastatin  5 mg Oral q1800   Continuous Infusions: . pantoprozole (PROTONIX) infusion 8 mg/hr (08/06/18 0132)     LOS: 1 day    Time spent: 29 minutes.     [name], MD [facility] Pager [phone]  If 7PM-7AM, please contact night-coverage [email or web address] Password [facility] 08/06/2018, 11:26 AM

## 2018-08-06 NOTE — Progress Notes (Signed)
Progress Note  Patient Name: Michael Stephens Date of Encounter: 08/06/2018  Primary Cardiologist: Carlyle Dolly, MD   Subjective   84 year old gentleman with a history of known coronary artery disease and severe right coronary artery stenosis.  He was admitted with chest pain and elevated troponin levels in the setting of severe anemia and a GI bleed.  Plavix and Xarelto have been held.  Hemoglobin was 5.2 on admission.  It is 6.8 this morning.   Inpatient Medications    Scheduled Meds: . sodium chloride   Intravenous Once  . metoprolol succinate  25 mg Oral Daily  . [START ON 08/08/2018] pantoprazole  40 mg Intravenous Q12H  . rosuvastatin  5 mg Oral q1800   Continuous Infusions: . pantoprozole (PROTONIX) infusion 8 mg/hr (08/06/18 0132)   PRN Meds: acetaminophen, nitroGLYCERIN, prochlorperazine   Vital Signs    Vitals:   08/05/18 2200 08/06/18 0345 08/06/18 0415 08/06/18 0459  BP: 105/61 117/66 101/64   Pulse:  95 85   Resp:  (!) 26 (!) 24   Temp:  98.6 F (37 C) 98.8 F (37.1 C)   TempSrc:  Oral Oral   SpO2:  95% 96%   Weight:    92.9 kg  Height:        Intake/Output Summary (Last 24 hours) at 08/06/2018 0745 Last data filed at 08/06/2018 0735 Gross per 24 hour  Intake 1166.44 ml  Output 680 ml  Net 486.44 ml   Last 3 Weights 08/06/2018 08/05/2018 08/05/2018  Weight (lbs) 204 lb 12.9 oz 205 lb 14.4 oz 210 lb  Weight (kg) 92.9 kg 93.396 kg 95.255 kg      Telemetry     sinus tach  - Personally Reviewed  ECG     NSR  - Personally Reviewed  Physical Exam   GEN: elderly man,  NAD  Neck: No JVD Cardiac: RRR, no murmurs, rubs, or gallops.  Respiratory: Clear to auscultation bilaterally. GI: Soft, nontender, non-distended  MS: No edema; No deformity. Neuro:  Nonfocal  Psych: Normal affect   Labs    Chemistry Recent Labs  Lab 08/05/18 0407 08/05/18 1433 08/06/18 0050  NA 139  --  138  K 3.4* 4.1 4.3  CL 101  --  103  CO2 26  --  23   GLUCOSE 195*  --  106*  BUN 38*  --  28*  CREATININE 0.93  --  0.86  CALCIUM 9.2  --  8.9  PROT 6.0*  --   --   ALBUMIN 3.4*  --   --   AST 17  --   --   ALT 15  --   --   ALKPHOS 60  --   --   BILITOT 0.4  --   --   GFRNONAA >60  --  >60  GFRAA >60  --  >60  ANIONGAP 12  --  12     Hematology Recent Labs  Lab 08/05/18 0407 08/05/18 1433 08/06/18 0050  WBC 7.6 9.9 9.7  RBC 1.95* 2.54* 2.45*  HGB 5.2* 6.7* 6.8*  HCT 18.1* 22.1* 21.4*  MCV 92.8 87.0 87.3  MCH 26.7 26.4 27.8  MCHC 28.7* 30.3 31.8  RDW 17.9* 17.0* 16.5*  PLT 221 187 169    Cardiac Enzymes Recent Labs  Lab 08/05/18 0407  TROPONINI 0.16*   No results for input(s): TROPIPOC in the last 168 hours.   BNP Recent Labs  Lab 08/05/18 0407  BNP 172.0*  DDimer No results for input(s): DDIMER in the last 168 hours.   Radiology    Dg Chest Port 1 View  Result Date: 08/05/2018 CLINICAL DATA:  84 year old male with chest pain and shortness of breath. EXAM: PORTABLE CHEST 1 VIEW COMPARISON:  07/08/2018 and earlier. FINDINGS: Portable AP upright view at 0424 hours. Mildly improved lung volumes and regressed bilateral pulmonary interstitial opacity since March. Stable cardiac size and mediastinal contours. Visualized tracheal air column is within normal limits. No pneumothorax, consolidation or pleural effusion. Lung markings appear at baseline. Paucity of bowel gas in the upper abdomen. No acute osseous abnormality identified. IMPRESSION: Resolved abnormal pulmonary interstitial opacity since March. No acute cardiopulmonary abnormality. Electronically Signed   By: Genevie Ann M.D.   On: 08/05/2018 04:57    Cardiac Studies   LEFT HEART CATH AND CORONARY ANGIOGRAPHY  07/14/2018  Conclusion    Significant three-vessel coronary artery disease.  Segmental 70% proximal to mid LAD.  This is an intermediate stenosis but would likely have positive FFR if tested.  Total occlusion of the second and third obtuse  marginal branches, the sources of the patient's recent non-ST elevation myocardial infarction.  The LAD supplies collaterals to the third obtuse marginal which is a large of the 3 obtuse marginals.  85% proximal RCA.  Right dominant coronary anatomy.  No aortic valve gradient  Mildly elevated LVEDP  RECOMMENDATIONS:   Combination antiplatelet and antithrombotic therapy will place the patient at increased risk for recurrent bleeding. Requires combination therapy because of paroxysmal atrial flutter and significant underlying ischemic heart disease.  Would not recommend either until hemoglobin is stable and above 9 for at least 2 weeks.  Would avoid aspirin.  Perhaps low-dose rivaroxaban 2.5 mg twice daily and Plavix 75 mg/day would be his best option (based on the [facility] [place] trial).  [facility] used aspirin, but this patient's endoscopy has suggested aspirin will be worse than Plavix as far as bleeding risk is concerned.  Low-dose rivaroxaban and Plavix has never been started as a stroke preventative measure.  No good data in this particular clinical subset.  Anti-ischemic therapy with beta-blocker and long-acting nitrates as tolerated by blood pressure.  Aggressive secondary risk factor modification including high intensity statin therapy.  Will need clinical cardiology and gastroenterology follow-up to coordinate care.  Cardiology follow-up should be within the next 4 weeks.  Hemoglobin in 2 and 4 weeks.     Patient Profile     Michael Stephens is a 84 y.o. male with a hx of atrial fibrillation/atrial flutter (previously on NOAC, but later switched to Coumadin due to financial restraints), chronic diastolic heart failure, COPD and CAD who is being seen  for the evaluation of possible ACS in the setting of severe anemia at the request of Dr. Karleen Hampshire.  Assessment & Plan    1. Chest pain with hx of CAD - Concerning for acute coronary syndrome VS demand ischemia in the setting of severe anemia and  GI bleed.  Troponin 0.16 (down from last admission) - Patient with known CAD with severe RCA stenosis. Occluded large OM and moderate LAD disease. He is not a candidate for intervention at this point due to severe anemia and inability to acutely anticoagulate or give DAPT.  - Held Plavix and Xarelto on admit. Once Hgb stable, plan to resume Plavix only. Continue Toprol, Imdur, statin.   2. Symptomatic anemia 2nd to GI blood loss - S/p transfusion. However Hgb still 6.8 this morning. Seen by GI and plan for  capsule endoscopy.   3. PAF - Coumadin was stopped during the recent cardiac catheterization.  It was plan to add 2.5 mg of Xarelto per Dr. Tamala Julian (based on the [facility] trial) if he is able to tolerate Plavix for several weeks.  Unfortunately he returned with significant anemia. Seems he is a candidate for anticoagulation given major bleeding events x 2.  4. HLD - 07/10/2018: Cholesterol 135; HDL 41; LDL Cholesterol 85; Triglycerides 47; VLDL 9  - Intolerance to lipitor - Continue Crestor 5mg       For questions or updates, please contact [place] Please consult [email or web address] for contact info under        SignedLeanor Kail, PA  08/06/2018, 7:45 AM    Attending Note:   The patient was seen and examined.  Agree with assessment and plan as noted above.  Changes made to the above note as needed.  Patient seen and independently examined with Robbie Lis, PA .   We discussed all aspects of the encounter. I agree with the assessment and plan as stated above.  1.   Chest pain: The patient has known coronary artery disease.  He presents with an acute GI bleed.  It is likely that his angina is due to his profound anemia.  At present he is not a candidate for any coronary intervention.  We will continue medical therapy.  The GI team is evaluating him for his GI bleed.  2.  Symptomatic anemia.  Had a GI bleed.  Hemoglobin is still quite low.  Capsule endoscopy is underway.   Coumadin and Plavix have been stopped.  3.  Paroxysmal atrial fibrillation.  His Coumadin has been stopped.  4.  Hyperlipidemia: Continue Crestor 5 mg a day.   I have spent a total of 40 minutes with patient reviewing hospital  notes , telemetry, EKGs, labs and examining patient as well as establishing an assessment and plan that was discussed with the patient. > 50% of time was spent in direct patient care.    Thayer Headings, Brooke Bonito., MD, [facility] 08/06/2018, 8:50 AM [address]  [place] Pager [phone]

## 2018-08-06 NOTE — Progress Notes (Signed)
Pt ingested pill cam at 0825. Pt tolerated procedure well. Diet instructions reviewed with pt; pt verbalized understanding. Report given to RN.

## 2018-08-07 LAB — CBC
HCT: 23.2 % — ABNORMAL LOW (ref 39.0–52.0)
Hemoglobin: 7.3 g/dL — ABNORMAL LOW (ref 13.0–17.0)
MCH: 28.3 pg (ref 26.0–34.0)
MCHC: 31.5 g/dL (ref 30.0–36.0)
MCV: 89.9 fL (ref 80.0–100.0)
Platelets: 155 10*3/uL (ref 150–400)
RBC: 2.58 MIL/uL — ABNORMAL LOW (ref 4.22–5.81)
RDW: 16.7 % — ABNORMAL HIGH (ref 11.5–15.5)
WBC: 6.8 10*3/uL (ref 4.0–10.5)
nRBC: 0.6 % — ABNORMAL HIGH (ref 0.0–0.2)

## 2018-08-07 LAB — BASIC METABOLIC PANEL
Anion gap: 10 (ref 5–15)
BUN: 23 mg/dL (ref 8–23)
CO2: 22 mmol/L (ref 22–32)
Calcium: 9 mg/dL (ref 8.9–10.3)
Chloride: 104 mmol/L (ref 98–111)
Creatinine, Ser: 0.88 mg/dL (ref 0.61–1.24)
GFR calc Af Amer: >60 mL/min (ref >60)
GFR calc non Af Amer: >60 mL/min (ref >60)
Glucose, Bld: 148 mg/dL — ABNORMAL HIGH (ref 70–99)
Potassium: 3.9 mmol/L (ref 3.5–5.1)
Sodium: 136 mmol/L (ref 135–145)

## 2018-08-07 LAB — PREPARE RBC (CROSSMATCH)

## 2018-08-07 LAB — HEMOGLOBIN AND HEMATOCRIT, BLOOD
HCT: 22.1 % — ABNORMAL LOW (ref 39.0–52.0)
Hemoglobin: 6.9 g/dL — CL (ref 13.0–17.0)

## 2018-08-07 MED ORDER — SODIUM CHLORIDE 0.9% IV SOLUTION
Freq: Once | INTRAVENOUS | Status: AC
  Administered 2018-08-07: 21:00:00 via INTRAVENOUS

## 2018-08-07 MED ORDER — SODIUM CHLORIDE 0.9% IV SOLUTION: Freq: Once | INTRAVENOUS | Status: DC

## 2018-08-07 MED ORDER — SUCRALFATE 1 G PO TABS
1.0000 g | ORAL_TABLET | Freq: Four times a day (QID) | ORAL | Status: DC
  Administered 2018-08-07 – 2018-08-09 (×7): 1 g via ORAL
  Filled 2018-08-07 (×7): qty 1

## 2018-08-07 NOTE — Progress Notes (Signed)
Progress Note  Patient Name: Michael Stephens Date of Encounter: 08/07/2018  Primary Cardiologist: Carlyle Dolly, MD   Subjective   84 year old gentleman with a history of known coronary artery disease and severe right coronary artery stenosis.  He was admitted with chest pain and elevated troponin levels in the setting of severe anemia and a GI bleed.  Plavix and Xarelto have been held.  Hemoglobin 5.2 on admission.  7.3 today.  Is down from 7.9 yesterday.  Patient is feeling well without chest pain or shortness of breath.  Does complain of abdominal pain.   Inpatient Medications    Scheduled Meds: . sodium chloride   Intravenous Once  . isosorbide mononitrate  30 mg Oral Daily  . metoprolol succinate  25 mg Oral Daily  . [START ON 08/08/2018] pantoprazole  40 mg Intravenous Q12H  . rosuvastatin  5 mg Oral q1800   Continuous Infusions: . pantoprozole (PROTONIX) infusion 8 mg/hr (08/06/18 2345)   PRN Meds: acetaminophen, alum & mag hydroxide-simeth, nitroGLYCERIN, prochlorperazine   Vital Signs    Vitals:   08/06/18 0907 08/06/18 2007 08/07/18 0626 08/07/18 0944  BP: 119/71 112/68 (!) 108/58 (!) 94/51  Pulse:  93 87 81  Resp:  (!) 22    Temp:  98.5 F (36.9 C) 98.6 F (37 C)   TempSrc:  Oral Oral   SpO2:  94% 90%   Weight:   92.4 kg   Height:        Intake/Output Summary (Last 24 hours) at 08/07/2018 1048 Last data filed at 08/07/2018 0800 Gross per 24 hour  Intake 360 ml  Output -  Net 360 ml   Last 3 Weights 08/07/2018 08/06/2018 08/06/2018  Weight (lbs) 203 lb 11.2 oz 204 lb 12.9 oz 204 lb 12.9 oz  Weight (kg) 92.398 kg 92.9 kg 92.9 kg      Telemetry    Sinus rhythm with PVCs- Personally Reviewed  ECG    Sinus rhythm, PVCs- Personally Reviewed  Physical Exam   GEN: Well nourished, well developed, in no acute distress  HEENT: normal  Neck: no JVD, carotid bruits, or masses Cardiac: RRR; no murmurs, rubs, or gallops,no edema  Respiratory:  clear to  auscultation bilaterally, normal work of breathing GI: soft, nontender, nondistended, + BS MS: no deformity or atrophy  Skin: warm and dry Neuro:  Strength and sensation are intact Psych: euthymic mood, full affect   Labs    Chemistry Recent Labs  Lab 08/05/18 0407 08/05/18 1433 08/06/18 0050 08/07/18 0947  NA 139  --  138 136  K 3.4* 4.1 4.3 3.9  CL 101  --  103 104  CO2 26  --  23 22  GLUCOSE 195*  --  106* 148*  BUN 38*  --  28* 23  CREATININE 0.93  --  0.86 0.88  CALCIUM 9.2  --  8.9 9.0  PROT 6.0*  --   --   --   ALBUMIN 3.4*  --   --   --   AST 17  --   --   --   ALT 15  --   --   --   ALKPHOS 60  --   --   --   BILITOT 0.4  --   --   --   GFRNONAA >60  --  >60 >60  GFRAA >60  --  >60 >60  ANIONGAP 12  --  12 10     Hematology Recent Labs  Lab 08/05/18  1433 08/06/18 0050 08/06/18 0933 08/07/18 0947  WBC 9.9 9.7  --  6.8  RBC 2.54* 2.45*  --  2.58*  HGB 6.7* 6.8* 7.9* 7.3*  HCT 22.1* 21.4* 24.8* 23.2*  MCV 87.0 87.3  --  89.9  MCH 26.4 27.8  --  28.3  MCHC 30.3 31.8  --  31.5  RDW 17.0* 16.5*  --  16.7*  PLT 187 169  --  155    Cardiac Enzymes Recent Labs  Lab 08/05/18 0407  TROPONINI 0.16*   No results for input(s): TROPIPOC in the last 168 hours.   BNP Recent Labs  Lab 08/05/18 0407  BNP 172.0*     DDimer No results for input(s): DDIMER in the last 168 hours.   Radiology    No results found.  Cardiac Studies   LEFT HEART CATH AND CORONARY ANGIOGRAPHY  07/14/2018  Conclusion    Significant three-vessel coronary artery disease.  Segmental 70% proximal to mid LAD.  This is an intermediate stenosis but would likely have positive FFR if tested.  Total occlusion of the second and third obtuse marginal branches, the sources of the patient's recent non-ST elevation myocardial infarction.  The LAD supplies collaterals to the third obtuse marginal which is a large of the 3 obtuse marginals.  85% proximal RCA.  Right dominant coronary  anatomy.  No aortic valve gradient  Mildly elevated LVEDP  RECOMMENDATIONS:   Combination antiplatelet and antithrombotic therapy Aksel Bencomo place the patient at increased risk for recurrent bleeding. Requires combination therapy because of paroxysmal atrial flutter and significant underlying ischemic heart disease.  Would not recommend either until hemoglobin is stable and above 9 for at least 2 weeks.  Would avoid aspirin.  Perhaps low-dose rivaroxaban 2.5 mg twice daily and Plavix 75 mg/day would be his best option (based on the [facility] trial).  [facility] used aspirin, but this patient's endoscopy has suggested aspirin Suhayla Chisom be worse than Plavix as far as bleeding risk is concerned.  Low-dose rivaroxaban and Plavix has never been started as a stroke preventative measure.  No good data in this particular clinical subset.  Anti-ischemic therapy with beta-blocker and long-acting nitrates as tolerated by blood pressure.  Aggressive secondary risk factor modification including high intensity statin therapy.  Karris Deangelo need clinical cardiology and gastroenterology follow-up to coordinate care.  Cardiology follow-up should be within the next 4 weeks.  Hemoglobin in 2 and 4 weeks.     Patient Profile     Maria Gallicchio is a 84 y.o. male with a hx of atrial fibrillation/atrial flutter (previously on NOAC, but later switched to Coumadin due to financial restraints), chronic diastolic heart failure, COPD and CAD who is being seen  for the evaluation of possible ACS in the setting of severe anemia at the request of Dr. Karleen Hampshire.  Assessment & Plan    1. Chest pain with hx of CAD Has a history of severe obstructive coronary artery disease.  His troponin was elevated this admission.  It is certainly possible that his chest pain is due to demand ischemia from his severe anemia.  Unfortunately we are having to hold his anticoagulation and his antiplatelets.  Janeisha Ryle likely resume Plavix once hemoglobin has  stabilized.   2. Symptomatic anemia 2nd to GI blood loss Status post transfusion.  Capsule endoscopy has been initiated.  3. PAF Currently holding anticoagulation.  Would restart after several weeks of restarting Plavix should his hemoglobin remained stable.  4. HLD - 07/10/2018: Cholesterol 135; HDL 41; LDL Cholesterol  85; Triglycerides 47; VLDL 9  Continue Crestor     For questions or updates, please contact [place] Please consult [email or web address] for contact info under        Signed, Vesper Trant Meredith Leeds, MD  08/07/2018, 10:48 AM    Attending Note:   The patient was seen and examined.  Agree with assessment and plan as noted above.  Changes made to the above note as needed.  Patient seen and independently examined with Robbie Lis, PA .   We discussed all aspects of the encounter. I agree with the assessment and plan as stated above.  1.   Chest pain: The patient has known coronary artery disease.  He presents with an acute GI bleed.  It is likely that his angina is due to his profound anemia.  At present he is not a candidate for any coronary intervention.  We Espiridion Supinski continue medical therapy.  The GI team is evaluating him for his GI bleed.  2.  Symptomatic anemia.  Had a GI bleed.  Hemoglobin is still quite low.  Capsule endoscopy is underway.  Coumadin and Plavix have been stopped.  3.  Paroxysmal atrial fibrillation.  His Coumadin has been stopped.  4.  Hyperlipidemia: Continue Crestor 5 mg a day.   I have spent a total of 40 minutes with patient reviewing hospital  notes , telemetry, EKGs, labs and examining patient as well as establishing an assessment and plan that was discussed with the patient. > 50% of time was spent in direct patient care.    Thayer Headings, Brooke Bonito., MD, [facility] 08/07/2018, 10:48 AM [address],  [place] Pager [phone]

## 2018-08-07 NOTE — Progress Notes (Addendum)
Dr. Macky Lower note reviewed, and agree.  Pt's CAPSULE ENDOSCOPY shows some erythematous, non-erosive gastritis and a focal strand of adherent blood in the proximal duodenum--both within the area examined by patient's egd 1 month ago which did not show any pathology in those locations, making an ongoing (or fixed) lesion such as an AVM unlikely.  No pathology was seen in the remainder of the small bowel.  Pt advised of findings.  It is felt that patient's drop in hgb and recurring/ongoing bld loss are probably from gastro-duodenal inflammation, perhaps from frequent use of alka-seltzer, augmented by dual-agent therapy with Plavix and Xarelto.  Proximal small bowel AVM's would be a less likely possibility.  RECOMMEND:  1. STRICT AVOIDANCE OF ALKA-SELTZER and other ulcerogenic medications  2. Increase outpatient PPI dose from once daily to twice daily and remain on that high dose indefinitely.  3. Will add sucralfate to pt's regimen to promote healing of gastro-duodenal mucosa.  This could be stopped after two weeks, but restarted if pt shows signs of recurrent bld loss.  4. If possible, although there is no absolute contraindication to resumption of Plavix and/or Xarelto at this time,  would prefer to give pt a "holiday" off both Plavix and Xarelto for a total of about 5-7 days to allow healing and clot maturation (Dr. Macky Lower note is actually suggesting 2 weeks); thereafter, should be ok to resume either or both of those medications from GI standpoint.  5.  Would monitor hemoccult status and hgb closely post dischg, especially after resumption of antiplatelet and/or anticoagulation therapy; if pt has recurrent anemia and/or evidence of ongoing heme positivity despite above measures, would consider repeat egd with careful inspection of proximal duodenum for AVM's (and ablative therapy, such as argon plasma coagulation, if present).  6. Concerning who should manage outpt monitoring of hgb and  hemoccult status, it would seem most appropriate for that to be done through Cardiology since pt will definitely need regular f/u w/ them, and especially during pandemic we are trying to avoid having pt's go in person to multiple offices.   In that case, we would be on standby for egd (as noted above) if there are ongoing bld loss issues.  However, if it is your preference that we see pt in outpt f/u, particularly for hemoccult monitoring, just let us know and we'd be happy to do that.  7.  Will sign off, but don't hesitate to call me if further questions or if I can be of further help in pt's care during this admission.  Cleotis Nipper, M.D. Pager [phone] If no answer or after 5 PM call [phone]

## 2018-08-07 NOTE — TOC Initial Note (Signed)
Transition of Care [facility]) - Initial/Assessment Note    Patient Details  Name: Michael Stephens MRN: [id number] Date of Birth: Jan 04, 1935  [facility] CM/SW Contact:    Eileen Stanford, LCSW Phone Number: 08/07/2018, 1:54 PM  Clinical Narrative:  Pt readmission risk screening completed. NO additional resources needed at this time. Current plan is d/c home.                  Expected Discharge Plan: Home/Self Care Barriers to Discharge: Continued Medical Work up   Patient Goals and CMS Choice Patient states their goals for this hospitalization and ongoing recovery are:: "to get back home to feed my cats and dogs"      Expected Discharge Plan and Services Expected Discharge Plan: Home/Self Care In-house Referral: NA Discharge Planning Services: NA   Living arrangements for the past 2 months: Single Family Home                 DME Arranged: N/A DME Agency: NA       HH Arranged: NA [place] Agency: NA        Prior Living Arrangements/Services Living arrangements for the past 2 months: Single Family Home Lives with:: Spouse Patient language and need for interpreter reviewed:: Yes Do you feel safe going back to the place where you live?: Yes      Need for Family Participation in Patient Care: Yes (Comment) Care giver support system in place?: Yes (comment) Current home services: (None) Criminal Activity/Legal Involvement Pertinent to Current Situation/Hospitalization: No - Comment as needed  Activities of Daily Living Home Assistive Devices/Equipment: Eyeglasses ADL Screening (condition at time of admission) Patient's cognitive ability adequate to safely complete daily activities?: Yes Is the patient deaf or have difficulty hearing?: Yes Does the patient have difficulty seeing, even when wearing glasses/contacts?: No Does the patient have difficulty concentrating, remembering, or making decisions?: No Patient able to express need for assistance with ADLs?: Yes Does  the patient have difficulty dressing or bathing?: No Independently performs ADLs?: Yes (appropriate for developmental age) Does the patient have difficulty walking or climbing stairs?: No Weakness of Legs: Both Weakness of Arms/Hands: None  Permission Sought/Granted                  Emotional Assessment Appearance:: Appears stated age Attitude/Demeanor/Rapport: (pt was appropriate) Affect (typically observed): Accepting, Calm, Appropriate Orientation: : Oriented to Self, Oriented to Place, Oriented to  Time, Oriented to Situation Alcohol / Substance Use: Not Applicable Psych Involvement: No (comment)  Admission diagnosis:  Posterior MI ([place]) [I21.29] Symptomatic anemia [D64.9] Patient Active Problem List   Diagnosis Date Noted  . Acute coronary syndrome ([place]) 08/05/2018  . Symptomatic anemia 08/05/2018  . Hypokalemia 08/05/2018  . Hyperglycemia 08/05/2018  . NSTEMI (non-ST elevated myocardial infarction) ([place]) 07/09/2018  . Anemia 07/08/2018  . Heme positive stool 07/08/2018  . Coagulopathy ([place]) 07/08/2018  . Chronic diastolic CHF (congestive heart failure) ([place]) 07/08/2018  . Upper GI bleed 07/08/2018  . Shortness of breath 04/01/2018  . BPH (benign prostatic hyperplasia) 04/01/2018  . AF (paroxysmal atrial fibrillation) ([place]) 04/01/2018  . Acute on chronic systolic heart failure ([place]) 03/31/2018  . Urinary retention 09/07/2017  . Constipation 09/07/2017  . CHF exacerbation ([place]) 09/04/2017  . Acute on chronic diastolic CHF (congestive heart failure) ([place]) 09/04/2017  . Encounter for therapeutic drug monitoring 08/27/2017  . Change in stool 03/18/2017  . Diarrhea 03/02/2017  . Aortic stenosis 03/02/2017  . CAP (community acquired pneumonia)  02/16/2017  . Essential hypertension 02/11/2017  . Demand ischemia ([place]) 02/11/2017  . Acute diastolic CHF (congestive heart failure) ([place]) 02/11/2017  . Acute respiratory failure with hypoxia ([place]) 02/11/2017  . Paroxysmal  atrial flutter ([place]) 02/09/2017   PCP:  [facility] [place]:   [place], [place] [address] Phone: [phone] Fax: [phone]     Social Determinants of Health (SDOH) Interventions    Readmission Risk Interventions Readmission Risk Prevention Plan 08/07/2018 08/07/2018  Transportation Screening - Complete  PCP or Specialist Appt within 3-5 Days - Complete  HRI or Home Care Consult Complete -  Social Work Consult for [place] Planning/Counseling Complete -  Palliative Care Screening - Not Applicable  Medication Review Press photographer) - Complete

## 2018-08-07 NOTE — Progress Notes (Signed)
CRITICAL VALUE ALERT  Critical Value:  Hemoglobin 6.9  Date & Time Notied:  08/07/2018 1846  Provider Notified: Yes   Orders Received/Actions taken: 1 unit PRBC

## 2018-08-07 NOTE — Progress Notes (Signed)
PROGRESS NOTE    Jarret Torre  MBT:[id number] DOB: 07-26-1934 DOA: 08/05/2018 PCP: [facility]    Brief Narrative:  Michael Stephens a 84 y.o.malewith medical history significant ofchronic combined systolic and diastolic CHF, atrial flutter, hypertension, BPH, renal cyst, chronic anemia, CAD, recent NSTEMI with diagnostic cath treated medically who is coming to the emergency department due to chest pain that woke him up from sleep prior coming to ED. Pt had recent cath done and he was discharged on  xarelto and plavix. He also underwent EGD and colonoscopy which were unrevealing for anemia of blood loss.  This admission he was found to have profound anemia with a hemoglobin of 5.2. Cardiology and GI consulted.  He underwent  A total of 4 units prbc transfusion and to keep his hemoglobin greater than 8. His post transfusion H&H stable around 7.    Assessment & Plan:   Principal Problem:   Acute coronary syndrome (HCC) Active Problems:   Essential hypertension   BPH (benign prostatic hyperplasia)   AF (paroxysmal atrial fibrillation) (HCC)   Chronic diastolic CHF (congestive heart failure) (HCC)   Symptomatic anemia   Hypokalemia   Hyperglycemia   Acute coronary syndrome: Holding plavix and xarelto for profound anemia and possible GI bleed.  Cardiology on board and appreciate recommendations. Plan to restart plavix  In 2 weeks post discharge. He currently denies any chest pain or sob.  EKG  Today shows Sinus rhythm with Premature atrial complexes ST & T wave abnormality, consider anterolateral ischemia     Symptomatic anemia Pt underwent EGD and colonoscopy earlier this month Dr Alessandra Bevels, which were unrevealing.  He was admitted with a hemoglobin of 5.2 and received about 4 units of prbc transfusion and his repeat H&H is stable around 7.  GI consulted Dr Cristina Gong recommended capsule endoscopy.  It shows erythematous nonerosive gastritis and a strand of  adherent blood in the proximal duodenum within the area examined at the last EGD. GI recommends strict avoidance of Alka-Seltzer and other ulcerogenic medications and increase PPI to twice daily indefinitely, add sucralfate for 2 weeks and continue as recommended. Restart Plavix and or Xarelto after 1 to 2 weeks to allow healing of the gastritis. Recommend close monitoring of the hemoglobin and if patient has recurrent anemia despite the above measures to consider repeating the EGD with careful inspection of the proximal duodenum for AVMs.    Hypokalemia:  Replaced.     Paroxysmal atrial fibrillation.  Rate controlled.    Chronic diastolic heart failure:  He appears to be compensated.    Hypertension:  bp parameters continue to be borderline.  Resume imdur and BB     Hyperlipidemia:  Resume crestor.    DVT prophylaxis: scd's Code Status: full code.  Family Communication: none at bedside.  Disposition Plan: Possible discharge in 1 to 2 days if hemoglobin remains stable  Consultants:   Cardiology   Gastroenterology.   Procedures:  Capsule endoscopy today  Antimicrobials: none.   Subjective: No chest pain or sob. Some bloating, requesting for buttermilk.  Objective: Vitals:   08/06/18 2007 08/07/18 0626 08/07/18 0944 08/07/18 1341  BP: 112/68 (!) 108/58 (!) 94/51 (!) 97/48  Pulse: 93 87 81 77  Resp: (!) 22   (!) 22  Temp: 98.5 F (36.9 C) 98.6 F (37 C)  98.1 F (36.7 C)  TempSrc: Oral Oral  Oral  SpO2: 94% 90%  97%  Weight:  92.4 kg    Height:  Intake/Output Summary (Last 24 hours) at 08/07/2018 1442 Last data filed at 08/07/2018 1342 Gross per 24 hour  Intake 582 ml  Output 150 ml  Net 432 ml   Filed Weights   08/06/18 0459 08/06/18 0828 08/07/18 0626  Weight: 92.9 kg 92.9 kg 92.4 kg    Examination:  General exam: Appears calm and comfortable , not in distress.  Respiratory system: Clear to auscultation. Respiratory effort  normal. Cardiovascular system: S1 & S2 heard, irregular, .No pedal edema. Gastrointestinal system: Abdomen is nondistended, soft , mild gen tenderness. No organomegaly or masses felt. Normal bowel sounds heard. Central nervous system: Alert and oriented. No focal neurological deficits. Extremities: Symmetric 5 x 5 power. Skin: No rashes, lesions or ulcers Psychiatry: Mood & affect appropriate.     Data Reviewed: I have personally reviewed following labs and imaging studies  CBC: Recent Labs  Lab 08/05/18 0407 08/05/18 1433 08/06/18 0050 08/06/18 0933 08/07/18 0947  WBC 7.6 9.9 9.7  --  6.8  NEUTROABS 4.0  --   --   --   --   HGB 5.2* 6.7* 6.8* 7.9* 7.3*  HCT 18.1* 22.1* 21.4* 24.8* 23.2*  MCV 92.8 87.0 87.3  --  89.9  PLT 221 187 169  --  858   Basic Metabolic Panel: Recent Labs  Lab 08/05/18 0407 08/05/18 1433 08/06/18 0050 08/07/18 0947  NA 139  --  138 136  K 3.4* 4.1 4.3 3.9  CL 101  --  103 104  CO2 26  --  23 22  GLUCOSE 195*  --  106* 148*  BUN 38*  --  28* 23  CREATININE 0.93  --  0.86 0.88  CALCIUM 9.2  --  8.9 9.0  MG 2.1  --   --   --   PHOS 3.3  --   --   --    GFR: Estimated Creatinine Clearance: 71 mL/min (by C-G formula based on SCr of 0.88 mg/dL). Liver Function Tests: Recent Labs  Lab 08/05/18 0407  AST 17  ALT 15  ALKPHOS 60  BILITOT 0.4  PROT 6.0*  ALBUMIN 3.4*   No results for input(s): LIPASE, AMYLASE in the last 168 hours. No results for input(s): AMMONIA in the last 168 hours. Coagulation Profile: No results for input(s): INR, PROTIME in the last 168 hours. Cardiac Enzymes: Recent Labs  Lab 08/05/18 0407  TROPONINI 0.16*   BNP (last 3 results) No results for input(s): PROBNP in the last 8760 hours. HbA1C: Recent Labs    08/05/18 0815  HGBA1C 5.2   CBG: No results for input(s): GLUCAP in the last 168 hours. Lipid Profile: No results for input(s): CHOL, HDL, LDLCALC, TRIG, CHOLHDL, LDLDIRECT in the last 72  hours. Thyroid Function Tests: No results for input(s): TSH, T4TOTAL, FREET4, T3FREE, THYROIDAB in the last 72 hours. Anemia Panel: No results for input(s): VITAMINB12, FOLATE, FERRITIN, TIBC, IRON, RETICCTPCT in the last 72 hours. Sepsis Labs: No results for input(s): PROCALCITON, LATICACIDVEN in the last 168 hours.  Recent Results (from the past 240 hour(s))  MRSA PCR Screening     Status: None   Collection Time: 08/05/18  8:43 AM  Result Value Ref Range Status   MRSA by PCR NEGATIVE NEGATIVE Final    Comment:        The GeneXpert MRSA Assay (FDA approved for NASAL specimens only), is one component of a comprehensive MRSA colonization surveillance program. It is not intended to diagnose MRSA infection nor to guide or  monitor treatment for MRSA infections. Performed at [facility], [place] [address] [place], [place] [address]          Radiology Studies: No results found.      Scheduled Meds: . sodium chloride   Intravenous Once  . isosorbide mononitrate  30 mg Oral Daily  . metoprolol succinate  25 mg Oral Daily  . [START ON 08/08/2018] pantoprazole  40 mg Intravenous Q12H  . rosuvastatin  5 mg Oral q1800   Continuous Infusions: . pantoprozole (PROTONIX) infusion 8 mg/hr (08/07/18 1107)     LOS: 2 days    Time spent: 26 minutes.     [name], MD [facility] Pager [phone]  If 7PM-7AM, please contact night-coverage [email or web address] Password [email or web address] 08/07/2018, 2:42 PM

## 2018-08-08 ENCOUNTER — Inpatient Hospital Stay (HOSPITAL_COMMUNITY): Payer: Medicare PPO

## 2018-08-08 LAB — TYPE AND SCREEN
ABO/RH(D): O NEG
Antibody Screen: NEGATIVE
Unit division: 0
Unit division: 0
Unit division: 0

## 2018-08-08 LAB — BPAM RBC
Blood Product Expiration Date: 202004282359
Blood Product Expiration Date: 202005012359
Blood Product Expiration Date: 202005022359
ISSUE DATE / TIME: 202004231845
ISSUE DATE / TIME: 202004240351
ISSUE DATE / TIME: 202004252024
Unit Type and Rh: 9500
Unit Type and Rh: 9500
Unit Type and Rh: 9500

## 2018-08-08 LAB — CBC
HCT: 24.8 % — ABNORMAL LOW (ref 39.0–52.0)
Hemoglobin: 7.7 g/dL — ABNORMAL LOW (ref 13.0–17.0)
MCH: 28 pg (ref 26.0–34.0)
MCHC: 31 g/dL (ref 30.0–36.0)
MCV: 90.2 fL (ref 80.0–100.0)
Platelets: 148 10*3/uL — ABNORMAL LOW (ref 150–400)
RBC: 2.75 MIL/uL — ABNORMAL LOW (ref 4.22–5.81)
RDW: 17 % — ABNORMAL HIGH (ref 11.5–15.5)
WBC: 7.1 10*3/uL (ref 4.0–10.5)
nRBC: 0.6 % — ABNORMAL HIGH (ref 0.0–0.2)

## 2018-08-08 MED ORDER — PANTOPRAZOLE SODIUM 40 MG PO TBEC
40.0000 mg | DELAYED_RELEASE_TABLET | Freq: Two times a day (BID) | ORAL | Status: DC
  Administered 2018-08-08 – 2018-08-09 (×3): 40 mg via ORAL
  Filled 2018-08-08 (×3): qty 1

## 2018-08-08 MED ORDER — FUROSEMIDE 40 MG PO TABS
40.0000 mg | ORAL_TABLET | Freq: Once | ORAL | Status: AC
  Administered 2018-08-08: 12:00:00 40 mg via ORAL
  Filled 2018-08-08: qty 1

## 2018-08-08 NOTE — Progress Notes (Signed)
PROGRESS NOTE    [name]  DGL:[id number] DOB: 04-13-1935 DOA: 08/05/2018 PCP: [facility]    Brief Narrative:  Michael Stephens a 84 y.o.malewith medical history significant ofchronic combined systolic and diastolic CHF, atrial flutter, hypertension, BPH, renal cyst, chronic anemia, CAD, recent NSTEMI with diagnostic cath treated medically who is coming to the emergency department due to chest pain that woke him up from sleep prior coming to ED. Pt had recent cath done and he was discharged on  xarelto and plavix. He also underwent EGD and colonoscopy which were unrevealing for anemia of blood loss.  This admission he was found to have profound anemia with a hemoglobin of 5.2. Cardiology and GI consulted.  He underwent  A total of 4 units prbc transfusion and to keep his hemoglobin greater than 8. His post transfusion H&H stable around 7.    Assessment & Plan:   Principal Problem:   Acute coronary syndrome (HCC) Active Problems:   Essential hypertension   BPH (benign prostatic hyperplasia)   AF (paroxysmal atrial fibrillation) (HCC)   Chronic diastolic CHF (congestive heart failure) (HCC)   Symptomatic anemia   Hypokalemia   Hyperglycemia   Acute coronary syndrome: Holding plavix and xarelto for profound anemia and possible GI bleed.  Cardiology on board and appreciate recommendations. Plan to restart plavix  In 2 weeks post discharge. He currently denies any chest pain or sob.  EKG  Today shows Sinus rhythm with Premature atrial complexes ST & T wave abnormality, consider anterolateral ischemia     Symptomatic anemia Pt underwent EGD and colonoscopy earlier this month Dr Alessandra Bevels, which were unrevealing.  He was admitted with a hemoglobin of 5.2 and received about 4 units of prbc transfusion and his repeat H&H is stable around 7.  GI consulted Dr Cristina Gong recommended capsule endoscopy.  It shows erythematous nonerosive gastritis and a strand of  adherent blood in the proximal duodenum within the area examined at the last EGD. GI recommends strict avoidance of Alka-Seltzer and other ulcerogenic medications and increase PPI to twice daily indefinitely, add sucralfate for 2 weeks and continue as recommended. Restart Plavix and or Xarelto after 1 to 2 weeks to allow healing of the gastritis. Recommend close monitoring of the hemoglobin and if patient has recurrent anemia despite the above measures to consider repeating the EGD with careful inspection of the proximal duodenum for AVMs.  Hemoglobin dropped to 6.9 yesterday, received 1 unit of prbc transfusion, repeat Hemoglobin greater than 7.  Change to oral PPI today and continue with sucralfate and if his H&H remains stable tomorrow, without any bleeding. Plan for d/c home tomorrow.     Hypokalemia:  Replaced.     Paroxysmal atrial fibrillation.  Rate controlled. Continue with BB.    Chronic diastolic heart failure:  He appears to be compensated.    Hypertension:  bp parameters continue to be borderline.  Resume imdur and BB     Hyperlipidemia:  Resume crestor.    DVT prophylaxis: scd's Code Status: full code.  Family Communication: none at bedside.  Disposition Plan: Possible discharge in 1 to 2 days if hemoglobin remains stable  Consultants:   Cardiology   Gastroenterology.   Procedures:  Capsule endoscopy today  Antimicrobials: none.   Subjective: SOME SOB today, will order one dose of lasix and check CXR .  Objective: Vitals:   08/08/18 0444 08/08/18 0445 08/08/18 1230 08/08/18 1421  BP: 121/60 121/60 113/74 107/61  Pulse: 80 78 91 68  Resp: Marland Kitchen)  22     Temp: 98.7 F (37.1 C)   98.7 F (37.1 C)  TempSrc: Oral   Oral  SpO2: 98% 97% 98% 93%  Weight: 93.9 kg     Height:        Intake/Output Summary (Last 24 hours) at 08/08/2018 1426 Last data filed at 08/08/2018 0830 Gross per 24 hour  Intake -  Output 500 ml  Net -500 ml   Filed Weights    08/06/18 0828 08/07/18 0626 08/08/18 0444  Weight: 92.9 kg 92.4 kg 93.9 kg    Examination:  General exam: Appears calm and comfortable , not in distress.  Respiratory system: Clear to auscultation. Respiratory effort normal. Cardiovascular system: S1 & S2 heard, irregular, .No pedal edema. Gastrointestinal system: Abdomen is nondistended, soft , mild gen tenderness. No organomegaly or masses felt. Normal bowel sounds heard. Central nervous system: Alert and oriented. No focal neurological deficits. Extremities: Symmetric 5 x 5 power. Skin: No rashes, lesions or ulcers Psychiatry: Mood & affect appropriate.     Data Reviewed: I have personally reviewed following labs and imaging studies  CBC: Recent Labs  Lab 08/05/18 0407 08/05/18 1433 08/06/18 0050 08/06/18 0933 08/07/18 0947 08/07/18 1807 08/08/18 0258  WBC 7.6 9.9 9.7  --  6.8  --  7.1  NEUTROABS 4.0  --   --   --   --   --   --   HGB 5.2* 6.7* 6.8* 7.9* 7.3* 6.9* 7.7*  HCT 18.1* 22.1* 21.4* 24.8* 23.2* 22.1* 24.8*  MCV 92.8 87.0 87.3  --  89.9  --  90.2  PLT 221 187 169  --  155  --  299*   Basic Metabolic Panel: Recent Labs  Lab 08/05/18 0407 08/05/18 1433 08/06/18 0050 08/07/18 0947  NA 139  --  138 136  K 3.4* 4.1 4.3 3.9  CL 101  --  103 104  CO2 26  --  23 22  GLUCOSE 195*  --  106* 148*  BUN 38*  --  28* 23  CREATININE 0.93  --  0.86 0.88  CALCIUM 9.2  --  8.9 9.0  MG 2.1  --   --   --   PHOS 3.3  --   --   --    GFR: Estimated Creatinine Clearance: 77 mL/min (by C-G formula based on SCr of 0.88 mg/dL). Liver Function Tests: Recent Labs  Lab 08/05/18 0407  AST 17  ALT 15  ALKPHOS 60  BILITOT 0.4  PROT 6.0*  ALBUMIN 3.4*   No results for input(s): LIPASE, AMYLASE in the last 168 hours. No results for input(s): AMMONIA in the last 168 hours. Coagulation Profile: No results for input(s): INR, PROTIME in the last 168 hours. Cardiac Enzymes: Recent Labs  Lab 08/05/18 0407  TROPONINI  0.16*   BNP (last 3 results) No results for input(s): PROBNP in the last 8760 hours. HbA1C: No results for input(s): HGBA1C in the last 72 hours. CBG: No results for input(s): GLUCAP in the last 168 hours. Lipid Profile: No results for input(s): CHOL, HDL, LDLCALC, TRIG, CHOLHDL, LDLDIRECT in the last 72 hours. Thyroid Function Tests: No results for input(s): TSH, T4TOTAL, FREET4, T3FREE, THYROIDAB in the last 72 hours. Anemia Panel: No results for input(s): VITAMINB12, FOLATE, FERRITIN, TIBC, IRON, RETICCTPCT in the last 72 hours. Sepsis Labs: No results for input(s): PROCALCITON, LATICACIDVEN in the last 168 hours.  Recent Results (from the past 240 hour(s))  MRSA PCR Screening  Status: None   Collection Time: 08/05/18  8:43 AM  Result Value Ref Range Status   MRSA by PCR NEGATIVE NEGATIVE Final    Comment:        The GeneXpert MRSA Assay (FDA approved for NASAL specimens only), is one component of a comprehensive MRSA colonization surveillance program. It is not intended to diagnose MRSA infection nor to guide or monitor treatment for MRSA infections. Performed at [facility], [place] [address], [place], [place] [address]          Radiology Studies: No results found.      Scheduled Meds: . sodium chloride   Intravenous Once  . sodium chloride   Intravenous Once  . isosorbide mononitrate  30 mg Oral Daily  . metoprolol succinate  25 mg Oral Daily  . pantoprazole  40 mg Oral BID  . rosuvastatin  5 mg Oral q1800  . sucralfate  1 g Oral QID   Continuous Infusions:    LOS: 3 days    Time spent: 26 minutes.     [name], MD [facility] Pager [phone]  If 7PM-7AM, please contact night-coverage [email or web address] Password [email or web address] 08/08/2018, 2:26 PM

## 2018-08-08 NOTE — Progress Notes (Signed)
Progress Note  Patient Name: Michael Stephens Date of Encounter: 08/08/2018  Primary Cardiologist: Carlyle Dolly, MD   Subjective   84 year old gentleman with a history of known coronary artery disease and severe right coronary artery stenosis.  He was admitted with chest pain and elevated troponin levels in the setting of severe anemia and a GI bleed.  Plavix and Xarelto have been held.  Hemoglobin continues to drop.  Was down to 6.9 yesterday.  Did receive a unit of red blood cells and is back up to 7.7 today.  He has no chest pain or shortness of breath.  Has remained in sinus rhythm.   Inpatient Medications    Scheduled Meds: . sodium chloride   Intravenous Once  . sodium chloride   Intravenous Once  . isosorbide mononitrate  30 mg Oral Daily  . metoprolol succinate  25 mg Oral Daily  . pantoprazole  40 mg Intravenous Q12H  . rosuvastatin  5 mg Oral q1800  . sucralfate  1 g Oral QID   Continuous Infusions:  PRN Meds: acetaminophen, alum & mag hydroxide-simeth, nitroGLYCERIN, prochlorperazine   Vital Signs    Vitals:   08/07/18 1341 08/07/18 2012 08/07/18 2052 08/08/18 0444  BP: (!) 97/48 116/65 (!) 113/58 121/60  Pulse: 77 78 74 80  Resp: (!) 22 19 20  (!) 22  Temp: 98.1 F (36.7 C) 98.6 F (37 C) 98.4 F (36.9 C) 98.7 F (37.1 C)  TempSrc: Oral Oral Oral Oral  SpO2: 97% 96% 99% 98%  Weight:    93.9 kg  Height:        Intake/Output Summary (Last 24 hours) at 08/08/2018 0800 Last data filed at 08/07/2018 1700 Gross per 24 hour  Intake 222 ml  Output 450 ml  Net -228 ml   Last 3 Weights 08/08/2018 08/07/2018 08/06/2018  Weight (lbs) 207 lb 1.6 oz 203 lb 11.2 oz 204 lb 12.9 oz  Weight (kg) 93.94 kg 92.398 kg 92.9 kg      Telemetry    Sinus rhythm with PVCs personally Reviewed  ECG    None new- Personally Reviewed  Physical Exam   GEN: Well nourished, well developed, in no acute distress  HEENT: normal  Neck: no JVD, carotid bruits, or  masses Cardiac: RRR; no murmurs, rubs, or gallops,no edema  Respiratory:  clear to auscultation bilaterally, normal work of breathing GI: soft, nontender, nondistended, + BS MS: no deformity or atrophy  Skin: warm and dry Neuro:  Strength and sensation are intact Psych: euthymic mood, full affect   Labs    Chemistry Recent Labs  Lab 08/05/18 0407 08/05/18 1433 08/06/18 0050 08/07/18 0947  NA 139  --  138 136  K 3.4* 4.1 4.3 3.9  CL 101  --  103 104  CO2 26  --  23 22  GLUCOSE 195*  --  106* 148*  BUN 38*  --  28* 23  CREATININE 0.93  --  0.86 0.88  CALCIUM 9.2  --  8.9 9.0  PROT 6.0*  --   --   --   ALBUMIN 3.4*  --   --   --   AST 17  --   --   --   ALT 15  --   --   --   ALKPHOS 60  --   --   --   BILITOT 0.4  --   --   --   GFRNONAA >60  --  >60 >60  GFRAA >60  --  >  60 >60  ANIONGAP 12  --  12 10     Hematology Recent Labs  Lab 08/06/18 0050  08/07/18 0947 08/07/18 1807 08/08/18 0258  WBC 9.7  --  6.8  --  7.1  RBC 2.45*  --  2.58*  --  2.75*  HGB 6.8*   < > 7.3* 6.9* 7.7*  HCT 21.4*   < > 23.2* 22.1* 24.8*  MCV 87.3  --  89.9  --  90.2  MCH 27.8  --  28.3  --  28.0  MCHC 31.8  --  31.5  --  31.0  RDW 16.5*  --  16.7*  --  17.0*  PLT 169  --  155  --  148*   < > = values in this interval not displayed.    Cardiac Enzymes Recent Labs  Lab 08/05/18 0407  TROPONINI 0.16*   No results for input(s): TROPIPOC in the last 168 hours.   BNP Recent Labs  Lab 08/05/18 0407  BNP 172.0*     DDimer No results for input(s): DDIMER in the last 168 hours.   Radiology    No results found.  Cardiac Studies   LEFT HEART CATH AND CORONARY ANGIOGRAPHY  07/14/2018  Conclusion    Significant three-vessel coronary artery disease.  Segmental 70% proximal to mid LAD.  This is an intermediate stenosis but would likely have positive FFR if tested.  Total occlusion of the second and third obtuse marginal branches, the sources of the patient's recent non-ST  elevation myocardial infarction.  The LAD supplies collaterals to the third obtuse marginal which is a large of the 3 obtuse marginals.  85% proximal RCA.  Right dominant coronary anatomy.  No aortic valve gradient  Mildly elevated LVEDP  RECOMMENDATIONS:   Combination antiplatelet and antithrombotic therapy  place the patient at increased risk for recurrent bleeding. Requires combination therapy because of paroxysmal atrial flutter and significant underlying ischemic heart disease.  Would not recommend either until hemoglobin is stable and above 9 for at least 2 weeks.  Would avoid aspirin.  Perhaps low-dose rivaroxaban 2.5 mg twice daily and Plavix 75 mg/day would be his best option (based on the [facility] trial).  [facility] used aspirin, but this patient's endoscopy has suggested aspirin  be worse than Plavix as far as bleeding risk is concerned.  Low-dose rivaroxaban and Plavix has never been started as a stroke preventative measure.  No good data in this particular clinical subset.  Anti-ischemic therapy with beta-blocker and long-acting nitrates as tolerated by blood pressure.  Aggressive secondary risk factor modification including high intensity statin therapy.   need clinical cardiology and gastroenterology follow-up to coordinate care.  Cardiology follow-up should be within the next 4 weeks.  Hemoglobin in 2 and 4 weeks.     Patient Profile     Michael Stephens is a 84 y.o. male with a hx of atrial fibrillation/atrial flutter (previously on NOAC, but later switched to Coumadin due to financial restraints), chronic diastolic heart failure, COPD and CAD who is being seen  for the evaluation of possible ACS in the setting of severe anemia at the request of Dr. Karleen Hampshire.  Assessment & Plan    1. Chest pain with hx of CAD Has severe obstructive coronary artery disease.  Troponin was elevated on admission, could certainly be due to demand ischemia from his anemia.  He is not  currently having chest pain.  We  make no changes at this time.  Continue to hold Plavix.  2. Symptomatic anemia 2nd to GI blood loss Continue to have blood loss requiring transfusion.  Plan per primary team.  3. PAF Fortunately he remains in sinus rhythm.  Continue to hold Xarelto.  4. HLD - 07/10/2018: Cholesterol 135; HDL 41; LDL Cholesterol 85; Triglycerides 47; VLDL 9  Continue Crestor.     For questions or updates, please contact [place] Please consult [email or web address] for contact info under        Signed,  Meredith Leeds, MD  08/08/2018, 8:00 AM    Attending Note:   The patient was seen and examined.  Agree with assessment and plan as noted above.  Changes made to the above note as needed.  Patient seen and independently examined with Robbie Lis, PA .   We discussed all aspects of the encounter. I agree with the assessment and plan as stated above.  1.   Chest pain: The patient has known coronary artery disease.  He presents with an acute GI bleed.  It is likely that his angina is due to his profound anemia.  At present he is not a candidate for any coronary intervention.  We  continue medical therapy.  The GI team is evaluating him for his GI bleed.  2.  Symptomatic anemia.  Had a GI bleed.  Hemoglobin is still quite low.  Capsule endoscopy is underway.  Coumadin and Plavix have been stopped.  3.  Paroxysmal atrial fibrillation.  His Coumadin has been stopped.  4.  Hyperlipidemia: Continue Crestor 5 mg a day.   I have spent a total of 40 minutes with patient reviewing hospital  notes , telemetry, EKGs, labs and examining patient as well as establishing an assessment and plan that was discussed with the patient. > 50% of time was spent in direct patient care.    Thayer Headings, Brooke Bonito., MD, [facility] 08/08/2018, 8:00 AM [address],  [place] Pager [phone]

## 2018-08-09 DIAGNOSIS — I248 Other forms of acute ischemic heart disease: Secondary | ICD-10-CM

## 2018-08-09 LAB — CBC
HCT: 25.3 % — ABNORMAL LOW (ref 39.0–52.0)
Hemoglobin: 8.1 g/dL — ABNORMAL LOW (ref 13.0–17.0)
MCH: 29 pg (ref 26.0–34.0)
MCHC: 32 g/dL (ref 30.0–36.0)
MCV: 90.7 fL (ref 80.0–100.0)
Platelets: 160 10*3/uL (ref 150–400)
RBC: 2.79 MIL/uL — ABNORMAL LOW (ref 4.22–5.81)
RDW: 18.1 % — ABNORMAL HIGH (ref 11.5–15.5)
WBC: 6.5 10*3/uL (ref 4.0–10.5)
nRBC: 1.1 % — ABNORMAL HIGH (ref 0.0–0.2)

## 2018-08-09 LAB — BASIC METABOLIC PANEL
Anion gap: 7 (ref 5–15)
BUN: 15 mg/dL (ref 8–23)
CO2: 25 mmol/L (ref 22–32)
Calcium: 8.4 mg/dL — ABNORMAL LOW (ref 8.9–10.3)
Chloride: 103 mmol/L (ref 98–111)
Creatinine, Ser: 0.88 mg/dL (ref 0.61–1.24)
GFR calc Af Amer: >60 mL/min (ref >60)
GFR calc non Af Amer: >60 mL/min (ref >60)
Glucose, Bld: 175 mg/dL — ABNORMAL HIGH (ref 70–99)
Potassium: 3.6 mmol/L (ref 3.5–5.1)
Sodium: 135 mmol/L (ref 135–145)

## 2018-08-09 MED ORDER — CYANOCOBALAMIN 500 MCG PO TABS: 500.0000 ug | ORAL_TABLET | Freq: Every day | ORAL | 1 refills | Status: DC

## 2018-08-09 MED ORDER — ALUM & MAG HYDROXIDE-SIMETH 200-200-20 MG/5ML PO SUSP: 30.0000 mL | ORAL | 0 refills | Status: AC | PRN

## 2018-08-09 MED ORDER — SUCRALFATE 1 G PO TABS: 1.0000 g | ORAL_TABLET | Freq: Four times a day (QID) | ORAL | 0 refills | Status: DC

## 2018-08-09 MED ORDER — AZITHROMYCIN 250 MG PO TABS
500.0000 mg | ORAL_TABLET | Freq: Every day | ORAL | Status: DC
  Administered 2018-08-09: 11:00:00 500 mg via ORAL
  Filled 2018-08-09: qty 2

## 2018-08-09 MED ORDER — AZITHROMYCIN 250 MG PO TABS: 500.0000 mg | ORAL_TABLET | Freq: Every day | ORAL | 0 refills | Status: DC

## 2018-08-09 MED ORDER — PANTOPRAZOLE SODIUM 40 MG PO TBEC: 40.0000 mg | DELAYED_RELEASE_TABLET | Freq: Two times a day (BID) | ORAL | 0 refills | Status: DC

## 2018-08-09 NOTE — Progress Notes (Signed)
Discharge order written. IVs removed per protocol. Discharge instructions reviewed and given to patient. All questions answered. Pt discharged in stable condition via wheelchair into the care of his wife.

## 2018-08-09 NOTE — TOC Transition Note (Signed)
Transition of Care [facility]) - CM/SW Discharge Note   Patient Details  Name: Michael Stephens MRN: [id number] Date of Birth: 05-31-1934  [facility] CM/SW Contact:  Bethena Roys, RN Phone Number: 08/09/2018, 12:51 PM   Clinical Narrative:   MD placed orders for [facility] RN/PT Services. Patient is still declining [facility] at this time. CM did make patient aware that if he needs services in the future to contact his PCP. No further needs from CM at this time.      Barriers to Discharge: Continued Medical Work up   Patient Goals and CMS Choice Patient states their goals for this hospitalization and ongoing recovery are:: "to get back home to feed my cats and dogs"       Discharge Plan and Services In-house Referral: NA Discharge Planning Services: NA            DME Arranged: N/A DME Agency: NA       HH Arranged: NA HH Agency: NA        Social Determinants of Health (SDOH) Interventions     Readmission Risk Interventions Readmission Risk Prevention Plan 08/07/2018 08/07/2018  Transportation Screening - Complete  PCP or Specialist Appt within 3-5 Days - Complete  HRI or Home Care Consult Complete -  Social Work Consult for [place] Planning/Counseling Complete -  Palliative Care Screening - Not Applicable  Medication Review [employer]) - Complete

## 2018-08-09 NOTE — Progress Notes (Signed)
Progress Note  Patient Name: Michael Stephens Date of Encounter: 08/09/2018  Primary Cardiologist: Carlyle Dolly, MD   Subjective   No chest pain or shortness of breath this morning.  Inpatient Medications    Scheduled Meds: . sodium chloride   Intravenous Once  . sodium chloride   Intravenous Once  . isosorbide mononitrate  30 mg Oral Daily  . metoprolol succinate  25 mg Oral Daily  . pantoprazole  40 mg Oral BID  . rosuvastatin  5 mg Oral q1800  . sucralfate  1 g Oral QID   Continuous Infusions:  PRN Meds: acetaminophen, alum & mag hydroxide-simeth, nitroGLYCERIN, prochlorperazine   Vital Signs    Vitals:   08/08/18 1230 08/08/18 1421 08/08/18 2030 08/09/18 0445  BP: 113/74 107/61 114/66 119/65  Pulse: 91 68 66 74  Resp:   (!) 22 (!) 22  Temp:  98.7 F (37.1 C) 98.5 F (36.9 C) 98.5 F (36.9 C)  TempSrc:  Oral Oral Oral  SpO2: 98% 93% 93% 97%  Weight:    93.7 kg  Height:        Intake/Output Summary (Last 24 hours) at 08/09/2018 3545 Last data filed at 08/08/2018 1834 Gross per 24 hour  Intake 840 ml  Output -  Net 840 ml   Last 3 Weights 08/09/2018 08/08/2018 08/07/2018  Weight (lbs) 206 lb 9.6 oz 207 lb 1.6 oz 203 lb 11.2 oz  Weight (kg) 93.713 kg 93.94 kg 92.398 kg     Telemetry    Normal sinus rhythm without recurrent atrial arrhythmia - Personally Reviewed  Physical Exam  Elderly male in no distress GEN: No acute distress.   Neck: No JVD Cardiac: RRR, 2/6 systolic murmur at the right upper sternal border Respiratory: Clear to auscultation bilaterally. GI: Soft, nontender, non-distended  MS: No edema; No deformity. Neuro:  Nonfocal  Psych: Normal affect   Labs    Chemistry Recent Labs  Lab 08/05/18 0407 08/05/18 1433 08/06/18 0050 08/07/18 0947  NA 139  --  138 136  K 3.4* 4.1 4.3 3.9  CL 101  --  103 104  CO2 26  --  23 22  GLUCOSE 195*  --  106* 148*  BUN 38*  --  28* 23  CREATININE 0.93  --  0.86 0.88  CALCIUM 9.2  --  8.9  9.0  PROT 6.0*  --   --   --   ALBUMIN 3.4*  --   --   --   AST 17  --   --   --   ALT 15  --   --   --   ALKPHOS 60  --   --   --   BILITOT 0.4  --   --   --   GFRNONAA >60  --  >60 >60  GFRAA >60  --  >60 >60  ANIONGAP 12  --  12 10     Hematology Recent Labs  Lab 08/06/18 0050  08/07/18 0947 08/07/18 1807 08/08/18 0258  WBC 9.7  --  6.8  --  7.1  RBC 2.45*  --  2.58*  --  2.75*  HGB 6.8*   < > 7.3* 6.9* 7.7*  HCT 21.4*   < > 23.2* 22.1* 24.8*  MCV 87.3  --  89.9  --  90.2  MCH 27.8  --  28.3  --  28.0  MCHC 31.8  --  31.5  --  31.0  RDW 16.5*  --  16.7*  --  17.0*  PLT 169  --  155  --  148*   < > = values in this interval not displayed.    Cardiac Enzymes Recent Labs  Lab 08/05/18 0407  TROPONINI 0.16*   No results for input(s): TROPIPOC in the last 168 hours.   BNP Recent Labs  Lab 08/05/18 0407  BNP 172.0*     DDimer No results for input(s): DDIMER in the last 168 hours.   Radiology    Dg Chest Port 1 View  Result Date: 08/08/2018 CLINICAL DATA:  84 year old male with history of shortness of breath. EXAM: PORTABLE CHEST 1 VIEW COMPARISON:  Chest x-ray 08/05/2018. FINDINGS: Diffuse interstitial prominence and peribronchial cuffing. No confluent consolidative airspace disease. Scarring in the left lung base, similar to prior examinations. No evidence of pulmonary edema. Heart size is mildly enlarged. Upper mediastinal contours are within normal limits. IMPRESSION: 1. Diffuse peribronchial cuffing and interstitial prominence concerning for an acute bronchitis. 2. Aortic atherosclerosis. 3. Mild cardiomegaly. Electronically Signed   By: Vinnie Langton M.D.   On: 08/08/2018 15:14    Cardiac Studies   LEFT HEART CATH AND CORONARY ANGIOGRAPHY 07/14/2018  Conclusion    Significant three-vessel coronary artery disease.  Segmental 70% proximal to mid LAD.  This is an intermediate stenosis but would likely have positive FFR if tested.  Total occlusion of the  second and third obtuse marginal branches, the sources of the patient's recent non-ST elevation myocardial infarction.  The LAD supplies collaterals to the third obtuse marginal which is a large of the 3 obtuse marginals.  85% proximal RCA.  Right dominant coronary anatomy.  No aortic valve gradient  Mildly elevated LVEDP  RECOMMENDATIONS:   Combination antiplatelet and antithrombotic therapy will place the patient at increased risk for recurrent bleeding. Requires combination therapy because of paroxysmal atrial flutter and significant underlying ischemic heart disease.  Would not recommend either until hemoglobin is stable and above 9 for at least 2 weeks.  Would avoid aspirin.  Perhaps low-dose rivaroxaban 2.5 mg twice daily and Plavix 75 mg/day would be his best option (based on the [facility] trial).  [facility] used aspirin, but this patient's endoscopy has suggested aspirin will be worse than Plavix as far as bleeding risk is concerned.  Low-dose rivaroxaban and Plavix has never been started as a stroke preventative measure.  No good data in this particular clinical subset.  Anti-ischemic therapy with beta-blocker and long-acting nitrates as tolerated by blood pressure.  Aggressive secondary risk factor modification including high intensity statin therapy.  Will need clinical cardiology and gastroenterology follow-up to coordinate care.  Cardiology follow-up should be within the next 4 weeks.  Hemoglobin in 2 and 4 weeks.   Echo 07/09/2018 IMPRESSIONS    1. Severe hypokinesis of the left ventricular, entire inferolateral wall.  2. The left ventricle has mild-moderately reduced systolic function, with an ejection fraction of 40-45%. The cavity size was normal. There is mild asymmetric left ventricular hypertrophy. Left ventricular diastolic Doppler parameters are consistent  with impaired relaxation.  3. The right ventricle has normal systolc function. The cavity was normal. There is  no increase in right ventricular wall thickness. Right ventricular systolic pressure is moderately elevated with an estimated pressure of 63.7 mmHg.  4. Left atrial size was moderately dilated.  5. Right atrial size was moderately dilated.  6. The mitral valve is myxomatous. Mild thickening of the mitral valve leaflet.  7. The aortic valve is tricuspid Mild thickening of the aortic valve Mild  calcification of the aortic valve. Aortic valve regurgitation is trivial by color flow Doppler.  8. There is mild dilatation of the aortic root measuring 40 mm.  9. The inferior vena cava was dilated in size with >50% respiratory variability. 10. No intracardiac thrombi or masses were visualized.  Patient Profile     Michael Stephens a 84 y.o.malewith a hx of atrial fibrillation/atrial flutter (previously on NOAC,but later switched to Coumadin due to financial restraints), chronic diastolic heart failure, COPD and CADwho is being seen  for the evaluation of possible ACS in the setting of severe anemiaat the request ofDr. Akula.  Assessment & Plan    1. Chest pain Has severe obstructive coronary artery disease as above.  Troponin was elevated on admission (3.81), could certainly be due to demand ischemia from his anemia.  He is not currently having chest pain.  Continue to hold Plavix.  2. CAD - Patient with known CAD with severe RCA stenosis. Occluded large OM and moderate LAD disease. He is not a candidate for intervention at this point due to severe anemia and inability to acutely anticoagulate or give DAPT.   3. Symptomatic anemia 2nd to GI blood loss - Continue to have blood loss requiring transfusion.  - Seen by GI>> capsule endoscopy with gastritis. - Per primary team  4. PAF Fortunately he remains in sinus rhythm.  Continue to hold Xarelto.  5. HLD - 07/10/2018: Cholesterol 135; HDL 41; LDL Cholesterol 85; Triglycerides 47; VLDL 9  Continue Crestor.   [facility] will sign off.    Medication Recommendations:  Resume clopidogrel in 2 weeks. Remain OFF of anticoagulation Other recommendations (labs, testing, etc):  HgB follow-up labs as planned Follow up as an outpatient:  [facility] office - Dr Harl Bowie. Will arrange  For questions or updates, please contact [place] Please consult [email or web address] for contact info under     SignedLeanor Kail, PA  08/09/2018, 8:38 AM    Patient seen, examined. Available data reviewed. Agree with findings, assessment, and plan as outlined by Robbie Lis, PA.  The patient is independently interviewed and examined.  The exam findings documented above reflect my personal findings on exam of this patient today.  I carefully reviewed his chart and unfortunately he has had recurrent severe GI bleeding on anticoagulation/antiplatelet therapy.  Having reviewed indications for both, I agree that restarting clopidogrel after a 2-week holiday is appropriate.  I would not put him back on oral anticoagulation because I think the bleeding risk outweighs the potential benefit.  We will arrange follow-up with his primary cardiologist, Dr. Harl Bowie, in [place].  Sherren Mocha, M.D. 08/09/2018 10:32 AM

## 2018-08-09 NOTE — Care Management Important Message (Signed)
Important Message  Patient Details  Name: Michael Stephens MRN: [id number] Date of Birth: 09-22-1934   Medicare Important Message Given:  Yes    Orbie Pyo 08/09/2018, 4:26 PM

## 2018-08-09 NOTE — Discharge Instructions (Signed)
Antibiotic Medicine, Adult ° °Antibiotic medicines treat infections caused by a type of germ called bacteria. They work by killing the bacteria that make you sick. °When do I need to take antibiotics? °You often need these medicines to treat bacterial infections, such as: °· A urinary tract infection (UTI). °· Strep throat. °· Meningitis. This affects the spinal cord and brain. °· A bad lung infection. °You may start the medicines while your doctor waits for tests to come back. When the tests come back, your doctor may change or stop your medicine. °When are antibiotics not needed? °You do not need these medicines for most common illnesses, such as: °· A cold. °· The flu. °· A sore throat. °Antibiotics are not always needed for all infections caused by bacteria. Do not ask for these medicines, or take them, when they are not needed. °What are the risks of taking antibiotics? °Most antibiotics can cause an infection called Clostridioides difficile (C. diff).This causes watery poop (diarrhea). Let your doctor know right away if: °· You have watery poop while taking an antibiotic. °· You have watery poop after you stop taking an antibiotic. The illness can happen weeks after you stop the medicine. °You also have a risk of getting an infection in the future that antibiotics cannot treat (antibiotic-resistant infection). This type of infection can be dangerous. °What else should I know about taking antibiotics? ° °· You need to take the entire prescription. °? Take the medicine for as long as told by your doctor. °? Do not stop taking it even if you start to feel better. °· Try not to miss any doses. If you miss a dose, call your doctor. °· Birth control pills may not work. If you take birth control pills: °? Keep on taking them. °? Use a second form of birth control, such as a condom. Do this for as long as told by your doctor. °· Ask your doctor: °? How long to wait in between doses. °? If you should take the medicine  with food. °? If there is anything you should stay away from while taking the antibiotic, such as: °? Food. °? Drinks. °? Medicines. °? If there are any side effects you should watch for. °· Only take the medicines that your doctor told you to take. Do not take medicines that were given to someone else. °· Drink a large glass of water with the medicine. °· Ask the pharmacist for a tool to measure the medicine, such as: °? A syringe. °? A cup. °? A spoon. °· Throw away any extra medicine. °Contact a doctor if: °· You get worse. °· You have new joint pain or muscle aches after starting the medicine. °· You have side effects from the medicine, such as: °? Stomach pain. °? Watery poop. °? Feeling sick to your stomach (nausea). °Get help right away if: °· You have signs of a very bad allergic reaction. If this happens, stop taking the medicine right away. Signs may include: °? Hives. These are raised, itchy, red bumps on the skin. °? Skin rash. °? Trouble breathing. °? Wheezing. °? Swelling. °? Feeling dizzy. °? Throwing up (vomiting). °· Your pee (urine) is dark, or is the color of blood. °· Your skin turns yellow. °· You bruise easily. °· You bleed easily. °· You have very bad watery poop and cramps in your belly. °· You have a very bad headache. °Summary °· Antibiotics are often used to treat infections caused by bacteria. °· Only take   these medicines when needed. °· Let your doctor know if you have watery poop while taking an antibiotic. °· You need to take the entire prescription. °This information is not intended to replace advice given to you by your health care provider. Make sure you discuss any questions you have with your health care provider. °Document Released: 01/08/2008 Document Revised: 09/29/2017 Document Reviewed: 04/02/2016 °Elsevier Interactive Patient Education © 2019 [employer] ° °

## 2018-08-09 NOTE — Evaluation (Signed)
Physical Therapy Evaluation Patient Details Name: Michael Stephens MRN: [id number] DOB: 04-19-1934 Today's Date: 08/09/2018   History of Present Illness  84 y.o. male with medical history significant of chronic combined systolic and diastolic CHF, atrial flutter, hypertension, BPH, renal cyst, chronic anemia, CAD, recent NSTEMI with diagnostic cath treated medically, came to ED with chest pain. He was admitted with chest pain and elevated troponin levels in the setting of severe anemia and a GI bleed.  Hemoglobin was 5.2 on admission.  s/p transfusion Hgb now 8.1.   Clinical Impression  PTA pt reports living with wife in single story home with 2 steps to enter, and being independent with ambulation and iADLs. Pt is currently limited in safe ambulation by decreased awareness of his current generalized weakness and decreased stability with ambulation. Pt is mod I for bed mobility, supervision for transfers and hands on min guard for ambulation. Pt refuses to use AD and is mildly unsteady with gait. PT recommends HHPT level rehab to address strength and stability deficits however pt will likely refuse. PT will follow acutely.     Follow Up Recommendations Home health PT;Supervision for mobility/OOB    Equipment Recommendations  None recommended by PT       Precautions / Restrictions Precautions Precautions: None Restrictions Weight Bearing Restrictions: No      Mobility  Bed Mobility Overal bed mobility: Modified Independent             General bed mobility comments: seated EoB on entry  Transfers Overall transfer level: Needs assistance Equipment used: None Transfers: Sit to/from Stand Sit to Stand: Supervision         General transfer comment: supervision for safety, good power up and steadying in standing  Ambulation/Gait Ambulation/Gait assistance: Min guard Gait Distance (Feet): 250 Feet Assistive device: None Gait Pattern/deviations: Step-through pattern;Drifts  right/left;Shuffle Gait velocity: slowed Gait velocity interpretation: <1.31 ft/sec, indicative of household ambulator General Gait Details: hands on min guard for safety, slowed, drifting gait in hallway, 3x short steps with minor instability that did not require physical assist to steady      Balance Overall balance assessment: Needs assistance Sitting-balance support: Feet supported;No upper extremity supported Sitting balance-Leahy Scale: Good     Standing balance support: No upper extremity supported Standing balance-Leahy Scale: Fair                               Pertinent Vitals/Pain Pain Assessment: No/denies pain    Home Living Family/patient expects to be discharged to:: Private residence Living Arrangements: Spouse/significant other Available Help at Discharge: Family Type of Home: House Home Access: Stairs to enter Entrance Stairs-Rails: Psychiatric nurse of Steps: 2 in the front, 7-8 steps in the back, both have bilateral siderails Home Layout: One level Home Equipment: Cane - single point      Prior Function Level of Independence: Independent         Comments: drives, able to ambulate community distances, independent with iADLs     Hand Dominance        Extremity/Trunk Assessment   Upper Extremity Assessment Upper Extremity Assessment: Generalized weakness    Lower Extremity Assessment Lower Extremity Assessment: Generalized weakness       Communication   Communication: No difficulties  Cognition Arousal/Alertness: Awake/alert Behavior During Therapy: WFL for tasks assessed/performed Overall Cognitive Status: Within Functional Limits for tasks assessed  General Comments General comments (skin integrity, edema, etc.): Pt on 2L O2 via  on entry with SaO2 100%O2, removed and pt able to maintain SaO2 of 96%O2 on RA with sitting and ambulation, supplemental O2  left off with return to room        Assessment/Plan    PT Assessment Patient needs continued PT services  PT Problem List Decreased strength;Decreased balance;Decreased safety awareness       PT Treatment Interventions DME instruction;Gait training;Stair training;Functional mobility training;Therapeutic activities;Therapeutic exercise;Balance training;Neuromuscular re-education;Patient/family education;Cognitive remediation    PT Goals (Current goals can be found in the Care Plan section)  Acute Rehab PT Goals Patient Stated Goal: get his IV out PT Goal Formulation: With patient Time For Goal Achievement: 08/23/18 Potential to Achieve Goals: Good    Frequency Min 3X/week    AM-PAC PT "6 Clicks" Mobility  Outcome Measure Help needed turning from your back to your side while in a flat bed without using bedrails?: None Help needed moving from lying on your back to sitting on the side of a flat bed without using bedrails?: None Help needed moving to and from a bed to a chair (including a wheelchair)?: None Help needed standing up from a chair using your arms (e.g., wheelchair or bedside chair)?: None Help needed to walk in hospital room?: None Help needed climbing 3-5 steps with a railing? : A Little 6 Click Score: 23    End of Session Equipment Utilized During Treatment: Gait belt Activity Tolerance: Patient tolerated treatment well Patient left: in bed;with call bell/phone within reach Nurse Communication: Mobility status PT Visit Diagnosis: Unsteadiness on feet (R26.81);Other abnormalities of gait and mobility (R26.89);Muscle weakness (generalized) (M62.81);Difficulty in walking, not elsewhere classified (R26.2)    Time: 3845-3646 PT Time Calculation (min) (ACUTE ONLY): 19 min   Charges:   PT Evaluation $PT Eval Moderate Complexity: 1 Mod          Jassica Zazueta B. Migdalia Dk PT, DPT Acute Rehabilitation Services Pager [phone] Office [phone]   [place] 08/09/2018, 1:04 PM

## 2018-08-10 ENCOUNTER — Encounter (HOSPITAL_COMMUNITY): Payer: Self-pay | Admitting: Gastroenterology

## 2018-08-12 ENCOUNTER — Telehealth: Payer: Self-pay | Admitting: Cardiology

## 2018-08-12 NOTE — Telephone Encounter (Signed)
Follow Up:; ° ° °Returning your call. °

## 2018-08-12 NOTE — Telephone Encounter (Signed)
Please contact patient

## 2018-08-13 ENCOUNTER — Encounter: Payer: Self-pay | Admitting: Physician Assistant

## 2018-08-13 ENCOUNTER — Telehealth (INDEPENDENT_AMBULATORY_CARE_PROVIDER_SITE_OTHER): Payer: Medicare PPO | Admitting: Cardiology

## 2018-08-13 ENCOUNTER — Encounter: Payer: Self-pay | Admitting: *Deleted

## 2018-08-13 ENCOUNTER — Encounter: Payer: Self-pay | Admitting: Cardiology

## 2018-08-13 VITALS — Ht 72.0 in | Wt 210.0 lb

## 2018-08-13 DIAGNOSIS — I509 Heart failure, unspecified: Secondary | ICD-10-CM | POA: Diagnosis not present

## 2018-08-13 DIAGNOSIS — I10 Essential (primary) hypertension: Secondary | ICD-10-CM | POA: Diagnosis not present

## 2018-08-13 DIAGNOSIS — I4892 Unspecified atrial flutter: Secondary | ICD-10-CM | POA: Diagnosis not present

## 2018-08-13 DIAGNOSIS — I4891 Unspecified atrial fibrillation: Secondary | ICD-10-CM

## 2018-08-13 DIAGNOSIS — I2510 Atherosclerotic heart disease of native coronary artery without angina pectoris: Secondary | ICD-10-CM

## 2018-08-13 DIAGNOSIS — K922 Gastrointestinal hemorrhage, unspecified: Secondary | ICD-10-CM | POA: Diagnosis not present

## 2018-08-13 NOTE — Progress Notes (Signed)
Virtual Visit via Telephone Note   This visit type was conducted due to national recommendations for restrictions regarding the COVID-19 Pandemic (e.g. social distancing) in an effort to limit this patient's exposure and mitigate transmission in our community.  Due to his co-morbid illnesses, this patient is at least at moderate risk for complications without adequate follow up.  This format is felt to be most appropriate for this patient at this time.  The patient did not have access to video technology/had technical difficulties with video requiring transitioning to audio format only (telephone).  All issues noted in this document were discussed and addressed.  No physical exam could be performed with this format.  Please refer to the patient's chart for his  consent to telehealth for [facility].   Date:  08/13/2018   ID:  [name], DOB Sep 21, 1934, MRN [id number]  Patient Location: Home Provider Location: Home  PCP:  [facility]  Cardiologist:  Carlyle Dolly, MD  Electrophysiologist:  None   Evaluation Performed:  Follow-Up Visit  Chief Complaint:  Hospital follow up  History of Present Illness:    Harlon Kutner is a 84 y.o. male seen today for follow up of the following medical problems. This is a focused visit regarding repeat admission with chest pain and severe anemia.   1. CAD/ICM - abnormal stress test 2018 intermediate risk managed medcally  - admitted 06/2018 with epgiastric and chest pain, somewhat atypical chest pain symptoms - significant EKG changes, aVR ST elevation and diffuse ST depression suggesting LM or multivessel disease. Eventual severe troponin elevation peak 60 - management complicated by severe anemia, Hgb 5.5   - eventual cath 07/2018 showed ostial LAD 70%, OM2 100%, , OM3 100%, RCA 85%. Cultprit thought to be occluded OM2 and OM3. OM3 has colalterals from LAD. Managed medically due to GI bleed - 06/2018 echo LVEF 40-45%, grade I  diastolic dysfunction, PASP 64. Aortic root 40 mm - LVEF in 03/2018 was 55-60%   - GI recs were to start plavix, and add xarelto if no recurrent bleeding after 1 week on plavix.  - he reprots he had labs with pcp yesterday, told his cbc looked good  - side effects on lipitor muscle aches in the past. Also symptoms on pravastatin. He has not started the atorvastatin prescribed at discharge.  - no recent epigastric pain/chest pain since discharge.   - admitted again late 07/2018 with recurrent anemia and elevated troponin with chest pain - plavix and xarelto were held due to GI bleed - since discharge no recurrent chest pain    2. Atrial arrythmias history of atrial arrythmias including afib/aflutter/MAT - off anticoag due to recent GI bleed    3. Anemia/GI bleed - EGD 03/27 showed mild gastritis, duodenitis and mild esophagitis. - Colonoscopy showed few colon polyps which was removed with cold snare and biopsy forcep, medium sized lipomaat hepaticflexure, diverticulosis and large internal hemorrhoids.  - admitted again late 07/2018 with anemia. Hgb 5.2, received 4 unirt pRBCs.  capsule endoscopy shows erythematous nonerosive gastritis and a strand of adherent blood in the proximal duodenum within the area examined at the last EGD. GI recommends strict avoidance of Alka-Seltzer and other ulcerogenic medications and increase PPI to twice daily indefinitely, add sucralfate for 2 weeks and continue as recommended.  - recs to restart plavix and xarelto 1-2 weeks after disharge. - since discharge has not seen bleeding.    The patient does not have symptoms concerning for COVID-19 infection (fever, chills,  cough, or new shortness of breath).    Past Medical History:  Diagnosis Date  . Acute diastolic CHF (congestive heart failure) ([place]) 02/11/2017  . Anemia    CHRONIC   . Atrial flutter ([place])    a. diagnosed in 02/2017. Rate-control strategy pursued.   Marland Kitchen BPH (benign  prostatic hyperplasia)   . Chronic diastolic heart failure ([place])   . Dyspnea   . Hypertension   . NSTEMI (non-ST elevated myocardial infarction) ([place])   . Renal disorder    cyst on kidney   Past Surgical History:  Procedure Laterality Date  . ABCESS DRAINAGE  11/2008   BUTTOCKS  . BIOPSY  07/13/2018   Procedure: BIOPSY;  Surgeon: Otis Brace, MD;  Location: [facility];  Service: Gastroenterology;;  . CATARACT EXTRACTION, BILATERAL    . COLONOSCOPY N/A 04/02/2017   Procedure: COLONOSCOPY;  Surgeon: Rogene Houston, MD;  Location: [facility];  Service: Endoscopy;  Laterality: N/A;  10:55  . COLONOSCOPY WITH PROPOFOL N/A 07/13/2018   Procedure: COLONOSCOPY WITH PROPOFOL;  Surgeon: Otis Brace, MD;  Location: [place];  Service: Gastroenterology;  Laterality: N/A;  . ESOPHAGOGASTRODUODENOSCOPY (EGD) WITH PROPOFOL N/A 07/09/2018   Procedure: ESOPHAGOGASTRODUODENOSCOPY (EGD) WITH PROPOFOL;  Surgeon: Wilford Corner, MD;  Location: [place];  Service: Endoscopy;  Laterality: N/A;  . GIVENS CAPSULE STUDY N/A 08/06/2018   Procedure: GIVENS CAPSULE STUDY;  Surgeon: Ronald Lobo, MD;  Location: [place];  Service: Endoscopy;  Laterality: N/A;  . LEFT HEART CATH AND CORONARY ANGIOGRAPHY N/A 07/14/2018   Procedure: LEFT HEART CATH AND CORONARY ANGIOGRAPHY;  Surgeon: Belva Crome, MD;  Location: [place] [facility];  Service: Cardiovascular;  Laterality: N/A;  . POLYPECTOMY  04/02/2017   Procedure: POLYPECTOMY;  Surgeon: Rogene Houston, MD;  [facility];  Service: Endoscopy;;  asceding colon(hot snare)/ sigmoid colon times two (cold snare)  . POLYPECTOMY  07/13/2018   Procedure: POLYPECTOMY;  Surgeon: Otis Brace, MD;  Location: [facility];  Service: Gastroenterology;;     No outpatient medications have been marked as taking for the 08/13/18 encounter (Appointment) with Arnoldo Lenis, MD.     Allergies:   Atorvastatin and Penicillins    Social History   Tobacco Use  . Smoking status: Former Smoker    Types: Cigarettes    Last attempt to quit: 2006    Years since quitting: 14.3  . Smokeless tobacco: Never Used  Substance Use Topics  . Alcohol use: No  . Drug use: No     Family Hx: The patient's family history includes CAD in his father; CVA in his father; Diabetes in his sister. There is no history of Colon cancer.  ROS:   Please see the history of present illness.     All other systems reviewed and are negative.   Prior CV studies:   The following studies were reviewed today:  06/2018 echo IMPRESSIONS   1. Severe hypokinesis of the left ventricular, entire inferolateral wall. 2. The left ventricle has mild-moderately reduced systolic function, with an ejection fraction of 40-45%. The cavity size was normal. There is mild asymmetric left ventricular hypertrophy. Left ventricular diastolic Doppler parameters are consistent  with impaired relaxation. 3. The right ventricle has normal systolc function. The cavity was normal. There is no increase in right ventricular wall thickness. Right ventricular systolic pressure is moderately elevated with an estimated pressure of 63.7 mmHg. 4. Left atrial size was moderately dilated. 5. Right atrial size was moderately dilated. 6. The  mitral valve is myxomatous. Mild thickening of the mitral valve leaflet. 7. The aortic valve is tricuspid Mild thickening of the aortic valve Mild calcification of the aortic valve. Aortic valve regurgitation is trivial by color flow Doppler. 8. There is mild dilatation of the aortic root measuring 40 mm. 9. The inferior vena cava was dilated in size with >50% respiratory variability. 10. No intracardiac thrombi or masses were visualized.   07/2018 cath  Significant three-vessel coronary artery disease.  Segmental 70% proximal to mid LAD. This is an intermediate stenosis but would likely have positive FFR if tested.  Total  occlusion of the second and third obtuse marginal branches, the sources of the patient's recent non-ST elevation myocardial infarction. The LAD supplies collaterals to the third obtuse marginal which is a large of the 3 obtuse marginals.  85% proximal RCA. Right dominant coronary anatomy.  No aortic valve gradient  Mildly elevated LVEDP  RECOMMENDATIONS:   Combination antiplatelet and antithrombotic therapy will place the patient at increased risk for recurrent bleeding. Requires combination therapy because of paroxysmal atrial flutter and significant underlying ischemic heart disease. Would not recommend either until hemoglobin is stable and above 9 for at least 2 weeks. Would avoid aspirin. Perhaps low-dose rivaroxaban 2.5 mg twice daily and Plavix 75 mg/day would be his best option (based on the [facility] trial). [facility] used aspirin, but this patient's endoscopy has suggested aspirin will be worse than Plavix as far as bleeding risk is concerned. Low-dose rivaroxaban and Plavix has never been started as a stroke preventative measure.No good data in this particular clinical subset.  Anti-ischemic therapy with beta-blocker and long-acting nitrates as tolerated by blood pressure.  Aggressive secondary risk factor modification including high intensity statin therapy.  Will need clinical cardiology and gastroenterology follow-up to coordinate care. Cardiology follow-up should be within the next 4 weeks.  Hemoglobin in 2 and 4 weeks.    Labs/Other Tests and Data Reviewed:    EKG:  na  Recent Labs: 08/05/2018: ALT 15; B Natriuretic Peptide 172.0; Magnesium 2.1 08/09/2018: BUN 15; Creatinine, Ser 0.88; Hemoglobin 8.1; Platelets 160; Potassium 3.6; Sodium 135   Recent Lipid Panel Lab Results  Component Value Date/Time   CHOL 135 07/10/2018 03:39 AM   TRIG 47 07/10/2018 03:39 AM   HDL 41 07/10/2018 03:39 AM   CHOLHDL 3.3 07/10/2018 03:39 AM   LDLCALC 85 07/10/2018 03:39 AM     Wt Readings from Last 3 Encounters:  08/09/18 206 lb 9.6 oz (93.7 kg)  07/21/18 210 lb (95.3 kg)  07/14/18 213 lb (96.6 kg)     Objective:    Vital Signs:   Today's Vitals   08/13/18 1336  Weight: 210 lb (95.3 kg)  Height: 6' (1.829 m)   Body mass index is 28.48 kg/m.  Normal affect. Normal speech pattern and tone. Comfortable, no apparent distress. No audible signs of SOB wheezing.   ASSESSMENT & PLAN:    1. CAD/ICM -recent NSTEMI as reported above, management complicated by GI bleed and severe anemia. He had a cath as reported above but disease managed medically due to antiplatelet limitation from GI bleed - no DAPT due to recurrent issues with GI bleeding, severe anemia with multiple transfusions - continue to hold at this time, at f/u in 2 weeks if stable start plavix 75mg  daily alone - If stable over long term on antiplatelet/anticoag therapy  and recurrent symptoms could consider repeat cath for LAD and RCA disease  2. Afib - anticoag on hold due  to recent GI bleed - continue to hold at this time, would prioritize starting plavix initially and hold xarelto longer at next f/u      COVID-19 Education: The signs and symptoms of COVID-19 were discussed with the patient and how to seek care for testing (follow up with PCP or arrange E-visit).  The importance of social distancing was discussed today.  Time:   Today, I have spent 15 minutes with the patient with telehealth technology discussing the above problems.     Medication Adjustments/Labs and Tests Ordered: Current medicines are reviewed at length with the patient today.  Concerns regarding medicines are outlined above.   Tests Ordered: No orders of the defined types were placed in this encounter.   Medication Changes: No orders of the defined types were placed in this encounter.   Disposition:  Follow up 2 weeks. If stable likely start plavix 75mg  daily at that visit  Signed, Carlyle Dolly,  MD  08/13/2018 8:24 AM    [place]

## 2018-08-13 NOTE — Patient Instructions (Signed)
Your physician recommends that you schedule a follow-up appointment in: 2 [place]  Your physician recommends that you continue on your current medications as directed. Please refer to the Current Medication list given to you today.  Thank you for choosing [facility]!!

## 2018-08-18 ENCOUNTER — Other Ambulatory Visit: Payer: Self-pay | Admitting: Cardiology

## 2018-08-18 MED ORDER — ISOSORBIDE MONONITRATE ER 30 MG PO TB24: 30.0000 mg | ORAL_TABLET | Freq: Every day | ORAL | 3 refills | Status: AC

## 2018-08-18 MED ORDER — FUROSEMIDE 80 MG PO TABS: 40.0000 mg | ORAL_TABLET | Freq: Every day | ORAL | 1 refills | Status: DC

## 2018-08-18 NOTE — Telephone Encounter (Signed)
Done

## 2018-08-18 NOTE — Discharge Summary (Signed)
Physician Discharge Summary  Michael Stephens QJJ:[id number] DOB: 18-Jun-1934 DOA: 08/05/2018  PCP: Michael Stephens  Admit date: 08/05/2018 Discharge date: 08/09/2018  Admitted From: [facility].  Disposition: Home  Recommendations for Outpatient Follow-up:  1. Follow up with PCP in 1-2 weeks 2. Please obtain BMP/CBC in one week 3. Please follow up with cardiology as recommended.     Discharge Condition:stable.  CODE STATUS:full code.  Diet recommendation: Heart Healthy   Brief/Interim Summary: Michael Stephens a 84 y.o.malewith medical history significant ofchronic combined systolic and diastolic CHF, atrial flutter, hypertension, BPH, renal cyst, chronic anemia, CAD, recent NSTEMI with diagnostic cath treated medically who is coming to the emergency department due to chest pain that woke him up from sleepprior coming to ED. Pt had recent cath done and he was discharged on  xarelto and plavix. He also underwent EGD and colonoscopy which were unrevealing for anemia of blood loss.  This admission he was found to have profound anemia with a hemoglobin of 5.2. Cardiology and GI consulted.  He underwent  A total of 4 units prbc transfusion and to keep his hemoglobin greater than 8. His post transfusion H&H stable around 7.   Discharge Diagnoses:  Principal Problem:   Acute coronary syndrome ([place]) Active Problems:   Essential hypertension   BPH (benign prostatic hyperplasia)   AF (paroxysmal atrial fibrillation) (HCC)   Chronic diastolic CHF (congestive heart failure) (HCC)   Symptomatic anemia   Hypokalemia   Hyperglycemia  Acute coronary syndrome: Holding plavix and xarelto for profound anemia and possible GI bleed.  Cardiology on board and appreciate recommendations. Plan to restart plavix  In 2 weeks post discharge. He currently denies any chest pain or sob.  EKG  Today shows Sinus rhythm with Premature atrial complexes ST & T wave abnormality, consider anterolateral  ischemia     Symptomatic anemia Pt underwent EGD and colonoscopy earlier this month Dr Michael Stephens, which were unrevealing.  He was admitted with a hemoglobin of 5.2 and received about 4 units of prbc transfusion and his repeat H&H is stable around 7.  GI consulted Dr Michael Stephens recommended capsule endoscopy.  It shows erythematous nonerosive gastritis and a strand of adherent blood in the proximal duodenum within the area examined at the last EGD. GI recommends strict avoidance of Alka-Seltzer and other ulcerogenic medications and increase PPI to twice daily indefinitely, add sucralfate for 2 weeks and continue as recommended. Restart Plavix and or Xarelto after 1 to 2 weeks to allow healing of the gastritis. Recommend close monitoring of the hemoglobin and if patient has recurrent anemia despite the above measures to consider repeating the EGD with careful inspection of the proximal duodenum for AVMs.  Hemoglobin dropped to 6.9 yesterday, received 1 unit of prbc transfusion, repeat Hemoglobin greater than 7.  Change to oral PPI  and continue with sucralfate and if his H&H remains stable   Hypokalemia:  Replaced.     Paroxysmal atrial fibrillation.  Rate controlled. Continue with BB.    Chronic diastolic heart failure:  He appears to be compensated.    Hypertension:  bp parameters continue to be borderline.  Resume imdur and BB     Hyperlipidemia:  Resume crestor.    Discharge Instructions  Discharge Instructions    (HEART FAILURE PATIENTS) Call MD:  Anytime you have any of the following symptoms: 1) 3 pound weight gain in 24 hours or 5 pounds in 1 week 2) shortness of breath, with or without a dry hacking cough 3) swelling  in the hands, feet or stomach 4) if you have to sleep on extra pillows at night in order to breathe.   Complete by:  As directed    Diet - low sodium heart healthy   Complete by:  As directed    Discharge instructions   Complete by:   As directed    Please follow up with cardiology as recommended, and get CBC in one week to check your hemoglobin.  Stop the plavix and xarelto until you see your cardiologist.  You are getting an antibiotic for acute bronchitis.     Allergies as of 08/09/2018      Reactions   Atorvastatin    Severe leg pain/ache   Penicillins    Has patient had a PCN reaction causing immediate rash, facial/tongue/throat swelling, SOB or lightheadedness with hypotension: Yes Has patient had a PCN reaction causing severe rash involving mucus membranes or skin necrosis: No Has patient had a PCN reaction that required hospitalization: No Has patient had a PCN reaction occurring within the last 10 years: No If all of the above answers are "NO", then may proceed with Cephalosporin use.      Medication List    STOP taking these medications   aspirin-sod bicarb-citric acid 325 MG Tbef tablet Commonly known as:  ALKA-SELTZER   clopidogrel 75 MG tablet Commonly known as:  Plavix   rivaroxaban 20 MG Tabs tablet Commonly known as:  Xarelto   tamsulosin 0.4 MG Caps capsule Commonly known as:  FLOMAX     TAKE these medications   alum & mag hydroxide-simeth 200-200-20 MG/5ML suspension Commonly known as:  MAALOX/MYLANTA Take 30 mLs by mouth every 4 (four) hours as needed for indigestion or heartburn.   budesonide-formoterol 160-4.5 MCG/ACT inhaler Commonly known as:  SYMBICORT Inhale 2 puffs into the lungs 2 (two) times daily.   finasteride 5 MG tablet Commonly known as:  PROSCAR Take 5 mg by mouth daily.   furosemide 80 MG tablet Commonly known as:  LASIX Take 0.5-1 tablets (40-80 mg total) by mouth daily. Take 40 mg p.o. daily on Monday, Wednesday, Friday, and Sunday.  Take Lasix 80 mg p.o. daily on Tuesday, Thursday, and Saturday   Glucosamine 1500 Complex Caps Take 1 capsule by mouth 2 (two) times daily.   isosorbide mononitrate 30 MG 24 hr tablet Commonly known as:  IMDUR Take 1 tablet  (30 mg total) by mouth at bedtime.   losartan 25 MG tablet Commonly known as:  COZAAR Take 1 tablet (25 mg total) by mouth daily.   metoprolol succinate 25 MG 24 hr tablet Commonly known as:  Toprol XL Take 1 tablet (25 mg total) by mouth daily.   nitroGLYCERIN 0.4 MG SL tablet Commonly known as:  NITROSTAT Place 1 tablet (0.4 mg total) under the tongue every 5 (five) minutes as needed for chest pain (CP or SOB).   oxyCODONE 5 MG immediate release tablet Commonly known as:  Oxy IR/ROXICODONE Take 1 tablet (5 mg total) by mouth every 4 (four) hours as needed for moderate pain.   pantoprazole 40 MG tablet Commonly known as:  PROTONIX Take 1 tablet (40 mg total) by mouth 2 (two) times daily. What changed:  when to take this   potassium chloride SA 20 MEQ tablet Commonly known as:  K-DUR Take 1 tablet (20 mEq total) by mouth daily.   rosuvastatin 5 MG tablet Commonly known as:  CRESTOR Take 1 tablet (5 mg total) by mouth every other day.  sucralfate 1 g tablet Commonly known as:  CARAFATE Take 1 tablet (1 g total) by mouth 4 (four) times daily.   vitamin B-12 500 MCG tablet Commonly known as:  CYANOCOBALAMIN Take 1 tablet (500 mcg total) by mouth daily.      Follow-up Information    Michael Stephens. Schedule an appointment as soon as possible for a visit in 1 week(s).   Contact information: [place] [address] [phone]        Arnoldo Lenis, MD Follow up on 08/13/2018.   Specialty:  Cardiology Why:  @1 :40pm for hospital follow up  Contact information: [place] [address] [phone]          Allergies  Allergen Reactions  . Atorvastatin     Severe leg pain/ache  . Penicillins     Has patient had a PCN reaction causing immediate rash, facial/tongue/throat swelling, SOB or lightheadedness with hypotension: Yes Has patient had a PCN reaction causing severe rash involving mucus membranes or skin  necrosis: No Has patient had a PCN reaction that required hospitalization: No Has patient had a PCN reaction occurring within the last 10 years: No If all of the above answers are "NO", then may proceed with Cephalosporin use.    Consultations:  Cardiology  Gastroenterology.    Procedures/Studies: Dg Chest Port 1 View  Result Date: 08/08/2018 CLINICAL DATA:  84 year old male with history of shortness of breath. EXAM: PORTABLE CHEST 1 VIEW COMPARISON:  Chest x-ray 08/05/2018. FINDINGS: Diffuse interstitial prominence and peribronchial cuffing. No confluent consolidative airspace disease. Scarring in the left lung base, similar to prior examinations. No evidence of pulmonary edema. Heart size is mildly enlarged. Upper mediastinal contours are within normal limits. IMPRESSION: 1. Diffuse peribronchial cuffing and interstitial prominence concerning for an acute bronchitis. 2. Aortic atherosclerosis. 3. Mild cardiomegaly. Electronically Signed   By: Vinnie Langton M.D.   On: 08/08/2018 15:14   Dg Chest Port 1 View  Result Date: 08/05/2018 CLINICAL DATA:  84 year old male with chest pain and shortness of breath. EXAM: PORTABLE CHEST 1 VIEW COMPARISON:  07/08/2018 and earlier. FINDINGS: Portable AP upright view at 0424 hours. Mildly improved lung volumes and regressed bilateral pulmonary interstitial opacity since March. Stable cardiac size and mediastinal contours. Visualized tracheal air column is within normal limits. No pneumothorax, consolidation or pleural effusion. Lung markings appear at baseline. Paucity of bowel gas in the upper abdomen. No acute osseous abnormality identified. IMPRESSION: Resolved abnormal pulmonary interstitial opacity since March. No acute cardiopulmonary abnormality. Electronically Signed   By: Genevie Ann M.D.   On: 08/05/2018 04:57     Subjective: No new complaints.   Discharge Exam: Vitals:   08/08/18 2030 08/09/18 0445  BP: 114/66 119/65  Pulse: 66 74  Resp:  (!) 22 (!) 22  Temp: 98.5 F (36.9 C) 98.5 F (36.9 C)  SpO2: 93% 97%   Vitals:   08/08/18 1230 08/08/18 1421 08/08/18 2030 08/09/18 0445  BP: 113/74 107/61 114/66 119/65  Pulse: 91 68 66 74  Resp:   (!) 22 (!) 22  Temp:  98.7 F (37.1 C) 98.5 F (36.9 C) 98.5 F (36.9 C)  TempSrc:  Oral Oral Oral  SpO2: 98% 93% 93% 97%  Weight:    93.7 kg  Height:        General: Pt is alert, awake, not in acute distress Cardiovascular: RRR, S1/S2 +, no rubs, no gallops Respiratory: CTA bilaterally, no wheezing, no rhonchi  Abdominal: Soft, NT, ND, bowel sounds + Extremities: no edema, no cyanosis    The results of significant diagnostics from this hospitalization (including imaging, microbiology, ancillary and laboratory) are listed below for reference.     Microbiology: No results found for this or any previous visit (from the past 240 hour(s)).   Labs: BNP (last 3 results) Recent Labs    03/31/18 1554 07/09/18 1255 08/05/18 0407  BNP 691.0* 769.3* 458.0*   Basic Metabolic Panel: No results for input(s): NA, K, CL, CO2, GLUCOSE, BUN, CREATININE, CALCIUM, MG, PHOS in the last 168 hours. Liver Function Tests: No results for input(s): AST, ALT, ALKPHOS, BILITOT, PROT, ALBUMIN in the last 168 hours. No results for input(s): LIPASE, AMYLASE in the last 168 hours. No results for input(s): AMMONIA in the last 168 hours. CBC: No results for input(s): WBC, NEUTROABS, HGB, HCT, MCV, PLT in the last 168 hours. Cardiac Enzymes: No results for input(s): CKTOTAL, CKMB, CKMBINDEX, TROPONINI in the last 168 hours. BNP: Invalid input(s): POCBNP CBG: No results for input(s): GLUCAP in the last 168 hours. D-Dimer No results for input(s): DDIMER in the last 72 hours. Hgb A1c No results for input(s): HGBA1C in the last 72 hours. Lipid Profile No results for input(s): CHOL, HDL, LDLCALC, TRIG, CHOLHDL, LDLDIRECT in the last 72 hours. Thyroid function studies No results for input(s): TSH,  T4TOTAL, T3FREE, THYROIDAB in the last 72 hours.  Invalid input(s): FREET3 Anemia work up No results for input(s): VITAMINB12, FOLATE, FERRITIN, TIBC, IRON, RETICCTPCT in the last 72 hours. Urinalysis    Component Value Date/Time   COLORURINE YELLOW 11/06/2017 0936   APPEARANCEUR HAZY (A) 11/06/2017 0936   LABSPEC 1.010 11/06/2017 0936   PHURINE 6.0 11/06/2017 0936   GLUCOSEU NEGATIVE 11/06/2017 0936   HGBUR SMALL (A) 11/06/2017 0936   BILIRUBINUR NEGATIVE 11/06/2017 0936   KETONESUR NEGATIVE 11/06/2017 0936   PROTEINUR NEGATIVE 11/06/2017 0936   NITRITE POSITIVE (A) 11/06/2017 0936   LEUKOCYTESUR LARGE (A) 11/06/2017 0936   Sepsis Labs Invalid input(s): PROCALCITONIN,  WBC,  LACTICIDVEN Microbiology No results found for this or any previous visit (from the past 240 hour(s)).   Time coordinating discharge: 32 minutes  SIGNED:   [name], MD  [facility] 08/18/2018, 10:03 AM Pager   If 7PM-7AM, please contact night-coverage [email or web address] Password [email or web address]

## 2018-08-18 NOTE — Telephone Encounter (Signed)
NEEDING REFILL FOR  isosorbide mononitrate (IMDUR) 30 MG 24 hr tablet [[id number]]   furosemide (LASIX) 80 MG tablet [[id number]]   Sent to [facility]

## 2018-08-26 ENCOUNTER — Ambulatory Visit: Payer: Self-pay | Admitting: *Deleted

## 2018-09-01 ENCOUNTER — Telehealth (INDEPENDENT_AMBULATORY_CARE_PROVIDER_SITE_OTHER): Payer: Medicare PPO | Admitting: Cardiology

## 2018-09-01 ENCOUNTER — Encounter: Payer: Self-pay | Admitting: Cardiology

## 2018-09-01 VITALS — Ht 72.0 in | Wt 210.0 lb

## 2018-09-01 DIAGNOSIS — I2510 Atherosclerotic heart disease of native coronary artery without angina pectoris: Secondary | ICD-10-CM

## 2018-09-01 DIAGNOSIS — I4891 Unspecified atrial fibrillation: Secondary | ICD-10-CM | POA: Diagnosis not present

## 2018-09-01 NOTE — Progress Notes (Signed)
Virtual Visit via Telephone Note   This visit type was conducted due to national recommendations for restrictions regarding the COVID-19 Pandemic (e.g. social distancing) in an effort to limit this patient's exposure and mitigate transmission in our community.  Due to his co-morbid illnesses, this patient is at least at moderate risk for complications without adequate follow up.  This format is felt to be most appropriate for this patient at this time.  The patient did not have access to video technology/had technical difficulties with video requiring transitioning to audio format only (telephone).  All issues noted in this document were discussed and addressed.  No physical exam could be performed with this format.  Please refer to the patient's chart for his  consent to telehealth for [facility].   Date:  09/01/2018   ID:  Michael Stephens, DOB April 15, 1934, MRN [id number]  Patient Location: Home Provider Location: Office  PCP:  [facility]  Cardiologist:  Carlyle Dolly, MD  Electrophysiologist:  None   Evaluation Performed:  Follow-Up Visit  Chief Complaint:  3 week follow up  History of Present Illness:    Michael Stephens is a 84 y.o. male seen today for a focused visit on recent NSTEMI and GI bleed.   1. CAD/ICM -abnormal stress test 2018 intermediate risk managed medcally - admitted 06/2018 with epgiastric and chest pain, somewhat atypical chest pain symptoms -significant EKG changes, aVR ST elevation and diffuse ST depression suggesting LM or multivessel disease. Eventual severe troponin elevation peak 60 - management complicated by severe anemia, Hgb 5.5   - eventual cath 07/2018 showed ostial LAD 70%, OM2 100%, , OM3 100%, RCA 85%. Cultprit thought to be occluded OM2 and OM3. OM3 has colalterals from LAD. Managed medically due to GI bleed - 06/2018 echo LVEF 19-37%, grade I diastolic dysfunction, PASP 64. Aortic root 40 mm - LVEF in 03/2018 was 55-60%    - GI recs were to start plavix, and add xarelto if no recurrent bleeding after 1 week on plavix. - he reprots he had labs with pcp yesterday, told his cbc looked good  - side effects on lipitor muscle aches in the past. Also symptoms on pravastatin. He has not started the atorvastatin prescribed at discharge.  - no recent epigastric pain/chest pain since discharge.  - admitted again late 07/2018 with recurrent anemia and elevated troponin with chest pain - plavix and xarelto were held due to GI bleed   - no recent chest pain.     2. Atrial arrythmias history of atrial arrythmias including afib/aflutter/MAT - off anticoag due to recent GI bleed  - no recent GI bleeding    3. Anemia/GI bleed -EGD 03/27 showed mild gastritis, duodenitis and mild esophagitis. -Colonoscopy showed few colon polyps which was removed with cold snare and biopsy forcep, medium sized lipomaat hepaticflexure, diverticulosis and large internal hemorrhoids.  - admitted again late 07/2018 with anemia. Hgb 5.2, received 4 unirt pRBCs.  capsule endoscopy shows erythematous nonerosive gastritis and a strand of adherent blood in the proximal duodenum within the area examined at the last EGD. GI recommends strict avoidance of Alka-Seltzer and other ulcerogenic medications and increase PPI to twice daily indefinitely, add sucralfate for 2 weeks and continue as recommended.    - no recent blood in stools.    The patient does not have symptoms concerning for COVID-19 infection (fever, chills, cough, or new shortness of breath).    Past Medical History:  Diagnosis Date  . Acute diastolic CHF (congestive  heart failure) ([place]) 02/11/2017  . Anemia    CHRONIC   . Atrial flutter ([place])    a. diagnosed in 02/2017. Rate-control strategy pursued.   Marland Kitchen BPH (benign prostatic hyperplasia)   . Chronic diastolic heart failure ([place])   . Dyspnea   . Hypertension   . NSTEMI (non-ST elevated myocardial  infarction) ([place])   . Renal disorder    cyst on kidney   Past Surgical History:  Procedure Laterality Date  . ABCESS DRAINAGE  11/2008   BUTTOCKS  . BIOPSY  07/13/2018   Procedure: BIOPSY;  Surgeon: Otis Brace, MD;  Location: [facility];  Service: Gastroenterology;;  . CATARACT EXTRACTION, BILATERAL    . COLONOSCOPY N/A 04/02/2017   Procedure: COLONOSCOPY;  Surgeon: Rogene Houston, MD;  Location: [facility];  Service: Endoscopy;  Laterality: N/A;  10:55  . COLONOSCOPY WITH PROPOFOL N/A 07/13/2018   Procedure: COLONOSCOPY WITH PROPOFOL;  Surgeon: Otis Brace, MD;  Location: [place];  Service: Gastroenterology;  Laterality: N/A;  . ESOPHAGOGASTRODUODENOSCOPY (EGD) WITH PROPOFOL N/A 07/09/2018   Procedure: ESOPHAGOGASTRODUODENOSCOPY (EGD) WITH PROPOFOL;  Surgeon: Wilford Corner, MD;  Location: [place];  Service: Endoscopy;  Laterality: N/A;  . GIVENS CAPSULE STUDY N/A 08/06/2018   Procedure: GIVENS CAPSULE STUDY;  Surgeon: Ronald Lobo, MD;  Location: [place];  Service: Endoscopy;  Laterality: N/A;  . LEFT HEART CATH AND CORONARY ANGIOGRAPHY N/A 07/14/2018   Procedure: LEFT HEART CATH AND CORONARY ANGIOGRAPHY;  Surgeon: Belva Crome, MD;  Location: [facility];  Service: Cardiovascular;  Laterality: N/A;  . POLYPECTOMY  04/02/2017   Procedure: POLYPECTOMY;  Surgeon: Rogene Houston, MD;  [facility];  Service: Endoscopy;;  asceding colon(hot snare)/ sigmoid colon times two (cold snare)  . POLYPECTOMY  07/13/2018   Procedure: POLYPECTOMY;  Surgeon: Otis Brace, MD;  Location: [facility];  Service: Gastroenterology;;     No outpatient medications have been marked as taking for the 09/01/18 encounter (Appointment) with Arnoldo Lenis, MD.     Allergies:   Atorvastatin and Penicillins   Social History   Tobacco Use  . Smoking status: Former Smoker    Types: Cigarettes    Last attempt to quit: 2006    Years since  quitting: 14.3  . Smokeless tobacco: Never Used  Substance Use Topics  . Alcohol use: No  . Drug use: No     Family Hx: The patient's family history includes CAD in his father; CVA in his father; Diabetes in his sister. There is no history of Colon cancer.  ROS:   Please see the history of present illness.    All other systems reviewed and are negative.   Prior CV studies:   The following studies were reviewed today:  06/2018 echo IMPRESSIONS   1. Severe hypokinesis of the left ventricular, entire inferolateral wall. 2. The left ventricle has mild-moderately reduced systolic function, with an ejection fraction of 40-45%. The cavity size was normal. There is mild asymmetric left ventricular hypertrophy. Left ventricular diastolic Doppler parameters are consistent  with impaired relaxation. 3. The right ventricle has normal systolc function. The cavity was normal. There is no increase in right ventricular wall thickness. Right ventricular systolic pressure is moderately elevated with an estimated pressure of 63.7 mmHg. 4. Left atrial size was moderately dilated. 5. Right atrial size was moderately dilated. 6. The mitral valve is myxomatous. Mild thickening of the mitral valve leaflet. 7. The aortic valve is tricuspid Mild thickening of the aortic  valve Mild calcification of the aortic valve. Aortic valve regurgitation is trivial by color flow Doppler. 8. There is mild dilatation of the aortic root measuring 40 mm. 9. The inferior vena cava was dilated in size with >50% respiratory variability. 10. No intracardiac thrombi or masses were visualized.   07/2018 cath  Significant three-vessel coronary artery disease.  Segmental 70% proximal to mid LAD. This is an intermediate stenosis but would likely have positive FFR if tested.  Total occlusion of the second and third obtuse marginal branches, the sources of the patient's recent non-ST elevation myocardial infarction.  The LAD supplies collaterals to the third obtuse marginal which is a large of the 3 obtuse marginals.  85% proximal RCA. Right dominant coronary anatomy.  No aortic valve gradient  Mildly elevated LVEDP  RECOMMENDATIONS:   Combination antiplatelet and antithrombotic therapy will place the patient at increased risk for recurrent bleeding. Requires combination therapy because of paroxysmal atrial flutter and significant underlying ischemic heart disease. Would not recommend either until hemoglobin is stable and above 9 for at least 2 weeks. Would avoid aspirin. Perhaps low-dose rivaroxaban 2.5 mg twice daily and Plavix 75 mg/day would be his best option (based on the [facility] trial). [facility] used aspirin, but this patient's endoscopy has suggested aspirin will be worse than Plavix as far as bleeding risk is concerned. Low-dose rivaroxaban and Plavix has never been started as a stroke preventative measure.No good data in this particular clinical subset.  Anti-ischemic therapy with beta-blocker and long-acting nitrates as tolerated by blood pressure.  Aggressive secondary risk factor modification including high intensity statin therapy.  Will need clinical cardiology and gastroenterology follow-up to coordinate care. Cardiology follow-up should be within the next 4 weeks.  Hemoglobin in 2 and 4 weeks.  Labs/Other Tests and Data Reviewed:    EKG:  na  Recent Labs: 08/05/2018: ALT 15; B Natriuretic Peptide 172.0; Magnesium 2.1 08/09/2018: BUN 15; Creatinine, Ser 0.88; Hemoglobin 8.1; Platelets 160; Potassium 3.6; Sodium 135   Recent Lipid Panel Lab Results  Component Value Date/Time   CHOL 135 07/10/2018 03:39 AM   TRIG 47 07/10/2018 03:39 AM   HDL 41 07/10/2018 03:39 AM   CHOLHDL 3.3 07/10/2018 03:39 AM   LDLCALC 85 07/10/2018 03:39 AM    Wt Readings from Last 3 Encounters:  08/13/18 210 lb (95.3 kg)  08/09/18 206 lb 9.6 oz (93.7 kg)  07/21/18 210 lb (95.3 kg)      Objective:    Vital Signs:   Today's Vitals   09/01/18 0917  Weight: 210 lb (95.3 kg)  Height: 6' (1.829 m)   Body mass index is 28.48 kg/m.   ASSESSMENT & PLAN:    1. CAD/ICM -recent NSTEMI as reported above, management complicated by GI bleed and severe anemia. He had a cath as reported above but disease managed medically due to antiplatelet limitation from GI bleed - no DAPT due to recurrent issues with GI bleeding, severe anemia with multiple transfusions  - we will repeat cbc, if stable restart plavix alone 75mg  daily.     2. Afib - anticoag on hold due to recent GI bleed  - f/u cbc. If stable restart plavix, over a few weeks if stable on plavix would restart anticoag.     COVID-19 Education: The signs and symptoms of COVID-19 were discussed with the patient and how to seek care for testing (follow up with PCP or arrange E-visit).  The importance of social distancing was discussed today.  Time:  Today, I have spent 10 minutes with the patient with telehealth technology discussing the above problems.     Medication Adjustments/Labs and Tests Ordered: Current medicines are reviewed at length with the patient today.  Concerns regarding medicines are outlined above.   Tests Ordered: No orders of the defined types were placed in this encounter.   Medication Changes: No orders of the defined types were placed in this encounter.   Disposition:  Follow up 1 month  Signed, Carlyle Dolly, MD  09/01/2018 8:19 AM    [place]

## 2018-09-01 NOTE — Patient Instructions (Signed)
Your physician recommends that you schedule a follow-up appointment in: [place] physician recommends that you continue on your current medications as directed. Please refer to the Current Medication list given to you today.  Your physician recommends that you return for lab work CBC AT [place] [facility]ER   Thank you for choosing [facility]!!

## 2018-09-01 NOTE — Addendum Note (Signed)
Addended by: Julian Hy T on: 09/01/2018 10:16 AM   Modules accepted: Orders

## 2018-09-07 DIAGNOSIS — K922 Gastrointestinal hemorrhage, unspecified: Secondary | ICD-10-CM | POA: Diagnosis not present

## 2018-09-23 ENCOUNTER — Telehealth: Payer: Self-pay | Admitting: *Deleted

## 2018-09-23 NOTE — Telephone Encounter (Signed)
Medications and allergies reviewed with patient  Will have BP & HR available tomorrow Weight 210 lbs

## 2018-09-24 ENCOUNTER — Encounter: Payer: Self-pay | Admitting: Cardiology

## 2018-09-24 ENCOUNTER — Encounter: Payer: Self-pay | Admitting: *Deleted

## 2018-09-24 ENCOUNTER — Telehealth: Payer: Self-pay | Admitting: *Deleted

## 2018-09-24 ENCOUNTER — Telehealth (INDEPENDENT_AMBULATORY_CARE_PROVIDER_SITE_OTHER): Payer: Medicare PPO | Admitting: Cardiology

## 2018-09-24 VITALS — Ht 72.0 in | Wt 210.0 lb

## 2018-09-24 DIAGNOSIS — I4891 Unspecified atrial fibrillation: Secondary | ICD-10-CM

## 2018-09-24 DIAGNOSIS — I2510 Atherosclerotic heart disease of native coronary artery without angina pectoris: Secondary | ICD-10-CM | POA: Diagnosis not present

## 2018-09-24 NOTE — Telephone Encounter (Signed)
Per Dr Harl Bowie labs are stable and to restart Plavix 75 mg daily - spoke with pt and wife who say they would like to think this over and call me on Tuesday.

## 2018-09-24 NOTE — Progress Notes (Signed)
Virtual Visit via Telephone Note   This visit type was conducted due to national recommendations for restrictions regarding the COVID-19 Pandemic (e.g. social distancing) in an effort to limit this patient's exposure and mitigate transmission in our community.  Due to his co-morbid illnesses, this patient is at least at moderate risk for complications without adequate follow up.  This format is felt to be most appropriate for this patient at this time.  The patient did not have access to video technology/had technical difficulties with video requiring transitioning to audio format only (telephone).  All issues noted in this document were discussed and addressed.  No physical exam could be performed with this format.  Please refer to the patient's chart for his  consent to telehealth for [facility].   Date:  09/24/2018   ID:  Michael Stephens, DOB [birth year], MRN [id number]  Patient Location: Home Provider Location: Office  PCP:  [facility]  Cardiologist:  Carlyle Dolly, MD  Electrophysiologist:  None   Evaluation Performed:  Follow-Up Visit  Chief Complaint:  SOB  History of Present Illness:    Michael Stephens is a 84 y.o. male seen today for a focused visit on recent NSTEMI and GI bleed.   1. CAD/ICM -abnormal stress test 2018 intermediate risk managed medcally - admitted 06/2018 with epgiastric and chest pain, somewhat atypical chest pain symptoms -significant EKG changes, aVR ST elevation and diffuse ST depression suggesting LM or multivessel disease. Eventual severe troponin elevation peak 60 - management complicated by severe anemia, Hgb 5.5   - eventual cath 07/2018 showed ostial LAD 70%, OM2 100%, , OM3 100%, RCA 85%. Cultprit thought to be occluded OM2 and OM3. OM3 has colalterals from LAD. Managed medically due to GI bleed - 06/2018 echo LVEF 53-29%, grade I diastolic dysfunction, PASP 64. Aortic root 40 mm - LVEF in 03/2018 was 55-60%   - GI recs  were to start plavix, and add xarelto if no recurrent bleeding after 1 week on plavix. - he reprots he had labs with pcp yesterday, told his cbc looked good  - side effects on lipitor muscle aches in the past. Also symptoms on pravastatin. He has not started the atorvastatin prescribed at discharge.  - no recent epigastric pain/chest pain since discharge.  - admitted again late 07/2018 with recurrent anemia and elevated troponin with chest pain -plavix and xarelto were held due to GI bleed   - no recent chest pain.  - chronic SOB unchanged   2. Atrial arrythmias history of atrial arrythmias including afib/aflutter/MAT - off anticoag due to recent GI bleed  - no recent palpitations    3. Anemia/GI bleed -EGD 03/27 showed mild gastritis, duodenitis and mild esophagitis. -Colonoscopy showed few colon polyps which was removed with cold snare and biopsy forcep, medium sized lipomaat hepaticflexure, diverticulosis and large internal hemorrhoids.  - admitted again late 07/2018 with anemia. Hgb 5.2, received 4 unirt pRBCs. capsule endoscopy shows erythematous nonerosive gastritis and a strand of adherent blood in the proximal duodenum within the area examined at the last EGD. GI recommends strict avoidance of Alka-Seltzer and other ulcerogenic medications and increase PPI to twice daily indefinitely, add sucralfate for 2 weeks and continue as recommended.    - denies any recent bleeding. Last labs a week ago by pcp, we are requesting.     The patient does not have symptoms concerning for COVID-19 infection (fever, chills, cough, or new shortness of breath).    Past Medical History:  Diagnosis  Date   Acute diastolic CHF (congestive heart failure) ([place]) 02/11/2017   Anemia    CHRONIC    Atrial flutter ([place])    a. diagnosed in 02/2017. Rate-control strategy pursued.    BPH (benign prostatic hyperplasia)    Chronic diastolic heart failure (H[facility])     Dyspnea    Hypertension    NSTEMI (non-ST elevated myocardial infarction) [facility])    Renal disorder    cyst on kidney   Past Surgical History:  Procedure Laterality Date   ABCESS DRAINAGE  11/2008   BUTTOCKS   BIOPSY  07/13/2018   Procedure: BIOPSY;  Surgeon: Otis Brace, MD;  Location: [place];  Service: Gastroenterology;;   CATARACT EXTRACTION, BILATERAL     COLONOSCOPY N/A 04/02/2017   Procedure: COLONOSCOPY;  Surgeon: Rogene Houston, MD;  [facility];  Service: Endoscopy;  Laterality: N/A;  10:55   COLONOSCOPY WITH PROPOFOL N/A 07/13/2018   Procedure: COLONOSCOPY WITH PROPOFOL;  Surgeon: Otis Brace, MD;  Location: [place];  Service: Gastroenterology;  Laterality: N/A;   ESOPHAGOGASTRODUODENOSCOPY (EGD) WITH PROPOFOL N/A 07/09/2018   Procedure: ESOPHAGOGASTRODUODENOSCOPY (EGD) WITH PROPOFOL;  Surgeon: Wilford Corner, MD;  Location: [place];  Service: Endoscopy;  Laterality: N/A;   GIVENS CAPSULE STUDY N/A 08/06/2018   Procedure: GIVENS CAPSULE STUDY;  Surgeon: Ronald Lobo, MD;  Location: [place];  Service: Endoscopy;  Laterality: N/A;   LEFT HEART CATH AND CORONARY ANGIOGRAPHY N/A 07/14/2018   Procedure: LEFT HEART CATH AND CORONARY ANGIOGRAPHY;  Surgeon: Belva Crome, MD;  Location: [place] CV LAB;  Service: Cardiovascular;  Laterality: N/A;   POLYPECTOMY  04/02/2017   Procedure: POLYPECTOMY;  Surgeon: Rogene Houston, MD;  [facility];  Service: Endoscopy;;  asceding colon(hot snare)/ sigmoid colon times two (cold snare)   POLYPECTOMY  07/13/2018   Procedure: POLYPECTOMY;  Surgeon: Otis Brace, MD;  [facility];  Service: Gastroenterology;;     No outpatient medications have been marked as taking for the 09/24/18 encounter (Appointment) with Arnoldo Lenis, MD.     Allergies:   Atorvastatin and Penicillins   Social History   Tobacco Use   Smoking status: Former Smoker    Types:  Cigarettes    Quit date: 2006    Years since quitting: 14.4   Smokeless tobacco: Never Used  Substance Use Topics   Alcohol use: No   Drug use: No     Family Hx: The patient's family history includes CAD in his father; CVA in his father; Diabetes in his sister. There is no history of Colon cancer.  ROS:   Please see the history of present illness.     All other systems reviewed and are negative.   Prior CV studies:   The following studies were reviewed today:  06/2018 echo IMPRESSIONS   1. Severe hypokinesis of the left ventricular, entire inferolateral wall. 2. The left ventricle has mild-moderately reduced systolic function, with an ejection fraction of 40-45%. The cavity size was normal. There is mild asymmetric left ventricular hypertrophy. Left ventricular diastolic Doppler parameters are consistent  with impaired relaxation. 3. The right ventricle has normal systolc function. The cavity was normal. There is no increase in right ventricular wall thickness. Right ventricular systolic pressure is moderately elevated with an estimated pressure of 63.7 mmHg. 4. Left atrial size was moderately dilated. 5. Right atrial size was moderately dilated. 6. The mitral valve is myxomatous. Mild thickening of the mitral valve leaflet. 7. The aortic valve is  tricuspid Mild thickening of the aortic valve Mild calcification of the aortic valve. Aortic valve regurgitation is trivial by color flow Doppler. 8. There is mild dilatation of the aortic root measuring 40 mm. 9. The inferior vena cava was dilated in size with >50% respiratory variability. 10. No intracardiac thrombi or masses were visualized.   07/2018 cath  Significant three-vessel coronary artery disease.  Segmental 70% proximal to mid LAD. This is an intermediate stenosis but would likely have positive FFR if tested.  Total occlusion of the second and third obtuse marginal branches, the sources of the  patient's recent non-ST elevation myocardial infarction. The LAD supplies collaterals to the third obtuse marginal which is a large of the 3 obtuse marginals.  85% proximal RCA. Right dominant coronary anatomy.  No aortic valve gradient  Mildly elevated LVEDP  RECOMMENDATIONS:   Combination antiplatelet and antithrombotic therapy will place the patient at increased risk for recurrent bleeding. Requires combination therapy because of paroxysmal atrial flutter and significant underlying ischemic heart disease. Would not recommend either until hemoglobin is stable and above 9 for at least 2 weeks. Would avoid aspirin. Perhaps low-dose rivaroxaban 2.5 mg twice daily and Plavix 75 mg/day would be his best option (based on the [facility] trial). [facility] used aspirin, but this patient's endoscopy has suggested aspirin will be worse than Plavix as far as bleeding risk is concerned. Low-dose rivaroxaban and Plavix has never been started as a stroke preventative measure.No good data in this particular clinical subset.  Anti-ischemic therapy with beta-blocker and long-acting nitrates as tolerated by blood pressure.  Aggressive secondary risk factor modification including high intensity statin therapy.  Will need clinical cardiology and gastroenterology follow-up to coordinate care. Cardiology follow-up should be within the next 4 weeks.  Hemoglobin in 2 and 4 weeks.  Labs/Other Tests and Data Reviewed:    EKG:  n/a  Recent Labs: 08/05/2018: ALT 15; B Natriuretic Peptide 172.0; Magnesium 2.1 08/09/2018: BUN 15; Creatinine, Ser 0.88; Hemoglobin 8.1; Platelets 160; Potassium 3.6; Sodium 135   Recent Lipid Panel Lab Results  Component Value Date/Time   CHOL 135 07/10/2018 03:39 AM   TRIG 47 07/10/2018 03:39 AM   HDL 41 07/10/2018 03:39 AM   CHOLHDL 3.3 07/10/2018 03:39 AM   LDLCALC 85 07/10/2018 03:39 AM    Wt Readings from Last 3 Encounters:  09/01/18 210 lb (95.3 kg)  08/13/18 210  lb (95.3 kg)  08/09/18 206 lb 9.6 oz (93.7 kg)     Objective:    Vital Signs:  There were no vitals taken for this visit.   Today's Vitals   09/24/18 0933  Weight: 210 lb (95.3 kg)  Height: 6' (1.829 m)   Body mass index is 28.48 kg/m.  Normal affect. Normal speech pattern and tone. No audible signs of SOB or wheezing. Comfortable, no apparent distress.   ASSESSMENT & PLAN:    1. CAD/ICM -recent NSTEMI as reported above, management complicated by GI bleed and severe anemia. He had a cath as reported above but disease managed medically due to antiplatelet limitation from GI bleed -no DAPT due to recurrent issues with GI bleeding, severe anemia with multiple transfusions  -we will request the cbc he had last week by pcp, if stable start plavix 75mg  daily.     2. Afib - anticoag on hold due to recent GI bleed - continue to hold anticoag at this time, would prioritize starting plavix if Hgb is stable.     Addendum: pcp labs reviewed, Hgb  stable. We will start plavix 75mg  daily.    COVID-19 Education: The signs and symptoms of COVID-19 were discussed with the patient and how to seek care for testing (follow up with PCP or arrange E-visit).  The importance of social distancing was discussed today.  Time:   Today, I have spent 12 minutes with the patient with telehealth technology discussing the above problems.     Medication Adjustments/Labs and Tests Ordered: Current medicines are reviewed at length with the patient today.  Concerns regarding medicines are outlined above.   Tests Ordered: No orders of the defined types were placed in this encounter.   Medication Changes: No orders of the defined types were placed in this encounter.   Follow Up:  Virtual Visit 3 weeks  Signed, Carlyle Dolly, MD  09/24/2018 9:09 AM    [place]

## 2018-09-24 NOTE — Patient Instructions (Signed)
Your physician recommends that you schedule a follow-up appointment in: [place] physician recommends that you continue on your current medications as directed. Please refer to the Current Medication list given to you today.  Thank you for choosing [facility]!!

## 2018-10-14 ENCOUNTER — Encounter: Payer: Self-pay | Admitting: Cardiology

## 2018-10-14 ENCOUNTER — Telehealth: Payer: Medicare PPO | Admitting: Cardiology

## 2018-10-14 DIAGNOSIS — I4892 Unspecified atrial flutter: Secondary | ICD-10-CM | POA: Diagnosis not present

## 2018-10-14 DIAGNOSIS — M1711 Unilateral primary osteoarthritis, right knee: Secondary | ICD-10-CM | POA: Diagnosis not present

## 2018-10-14 DIAGNOSIS — Z6829 Body mass index (BMI) 29.0-29.9, adult: Secondary | ICD-10-CM | POA: Diagnosis not present

## 2018-10-14 DIAGNOSIS — M1712 Unilateral primary osteoarthritis, left knee: Secondary | ICD-10-CM | POA: Diagnosis not present

## 2018-10-14 DIAGNOSIS — I509 Heart failure, unspecified: Secondary | ICD-10-CM | POA: Diagnosis not present

## 2018-10-14 DIAGNOSIS — M79604 Pain in right leg: Secondary | ICD-10-CM | POA: Diagnosis not present

## 2018-10-14 DIAGNOSIS — I4891 Unspecified atrial fibrillation: Secondary | ICD-10-CM | POA: Diagnosis not present

## 2018-10-14 DIAGNOSIS — Z1389 Encounter for screening for other disorder: Secondary | ICD-10-CM | POA: Diagnosis not present

## 2018-10-14 NOTE — Progress Notes (Unsigned)
{Choose 1 Note Type (Telehealth Visit or Telephone Visit):[phone]}   Date:  10/14/2018   ID:  Michael Stephens, DOB 1935/04/08, MRN [id number]  {Patient Location:[phone]::"Home"} {Provider Location:[phone]::"Home"}  PCP:  [facility]  Cardiologist:  Carlyle Dolly, MD *** Electrophysiologist:  None   Evaluation Performed:  {Choose Visit [phone]::"Follow-Up Visit"}  Chief Complaint:  ***  History of Present Illness:    Michael Stephens is a 84 y.o. male with ***  seen today for a focused visit on recent NSTEMI and GI bleed.  1. CAD/ICM -abnormal stress test 2018 intermediate risk managed medcally - admitted 06/2018 with epgiastric and chest pain, somewhat atypical chest pain symptoms -significant EKG changes, aVR ST elevation and diffuse ST depression suggesting LM or multivessel disease. Eventual severe troponin elevation peak 60 - management complicated by severe anemia, Hgb 5.5   - eventual cath 07/2018 showed ostial LAD 70%, OM2 100%, , OM3 100%, RCA 85%. Cultprit thought to be occluded OM2 and OM3. OM3 has colalterals from LAD. Managed medically due to GI bleed - 06/2018 echo LVEF 46-50%, grade I diastolic dysfunction, PASP 64. Aortic root 40 mm - LVEF in 03/2018 was 55-60%   - GI recs were to start plavix, and add xarelto if no recurrent bleeding after 1 week on plavix. - he reprots he had labs with pcp yesterday, told his cbc looked good  - side effects on lipitor muscle aches in the past. Also symptoms on pravastatin. He has not started the atorvastatin prescribed at discharge.  - no recent epigastric pain/chest pain since discharge.  - admitted again late 07/2018 with recurrent anemia and elevated troponin with chest pain -plavix and xarelto were held due to GI bleed   - no recent chest pain. - chronic SOB unchanged   - after reviewing pcp labs last visit he was to restart plavix 75mg  daily    2. Atrial  arrythmias history of atrial arrythmias including afib/aflutter/MAT - off anticoag due to recent GI bleed  - no recent palpitations    3. Anemia/GI bleed -EGD 03/27 showed mild gastritis, duodenitis and mild esophagitis. -Colonoscopy showed few colon polyps which was removed with cold snare and biopsy forcep, medium sized lipomaat hepaticflexure, diverticulosis and large internal hemorrhoids.  - admitted again late 07/2018 with anemia. Hgb 5.2, received 4 unirt pRBCs. capsule endoscopy shows erythematous nonerosive gastritis and a strand of adherent blood in the proximal duodenum within the area examined at the last EGD. GI recommends strict avoidance of Alka-Seltzer and other ulcerogenic medications and increase PPI to twice daily indefinitely, add sucralfate for 2 weeks and continue as recommended.    - denies any recent bleeding. Last labs a week ago by pcp, we are requesting.    The patient {does/does not:200015} have symptoms concerning for COVID-19 infection (fever, chills, cough, or new shortness of breath).    Past Medical History:  Diagnosis Date  . Acute diastolic CHF (congestive heart failure) ([place]) 02/11/2017  . Anemia    CHRONIC   . Atrial flutter ([place])    a. diagnosed in 02/2017. Rate-control strategy pursued.   Marland Kitchen BPH (benign prostatic hyperplasia)   . Chronic diastolic heart failure ([place])   . Dyspnea   . Hypertension   . NSTEMI (non-ST elevated myocardial infarction) ([place])   . Renal disorder    cyst on kidney   Past Surgical History:  Procedure Laterality Date  . ABCESS DRAINAGE  11/2008   BUTTOCKS  . BIOPSY  07/13/2018   Procedure: BIOPSY;  Surgeon: Alessandra Bevels,  Orson Gear, MD;  Location: [place];  Service: Gastroenterology;;  . CATARACT EXTRACTION, BILATERAL    . COLONOSCOPY N/A 04/02/2017   Procedure: COLONOSCOPY;  Surgeon: Rogene Houston, MD;  [facility];  Service: Endoscopy;  Laterality: N/A;  10:55  . COLONOSCOPY WITH  PROPOFOL N/A 07/13/2018   Procedure: COLONOSCOPY WITH PROPOFOL;  Surgeon: Otis Brace, MD;  Location: [place];  Service: Gastroenterology;  Laterality: N/A;  . ESOPHAGOGASTRODUODENOSCOPY (EGD) WITH PROPOFOL N/A 07/09/2018   Procedure: ESOPHAGOGASTRODUODENOSCOPY (EGD) WITH PROPOFOL;  Surgeon: Wilford Corner, MD;  Location: [place];  Service: Endoscopy;  Laterality: N/A;  . GIVENS CAPSULE STUDY N/A 08/06/2018   Procedure: GIVENS CAPSULE STUDY;  Surgeon: Ronald Lobo, MD;  Location: [place];  Service: Endoscopy;  Laterality: N/A;  . LEFT HEART CATH AND CORONARY ANGIOGRAPHY N/A 07/14/2018   Procedure: LEFT HEART CATH AND CORONARY ANGIOGRAPHY;  Surgeon: Belva Crome, MD;  Location: [place] [facility];  Service: Cardiovascular;  Laterality: N/A;  . POLYPECTOMY  04/02/2017   Procedure: POLYPECTOMY;  Surgeon: Rogene Houston, MD;  [facility];  Service: Endoscopy;;  asceding colon(hot snare)/ sigmoid colon times two (cold snare)  . POLYPECTOMY  07/13/2018   Procedure: POLYPECTOMY;  Surgeon: Otis Brace, MD;  Location: [facility] END[facility]COPY;  Service: Gastroenterology;;     No outpatient medications have been marked as taking for the 10/14/18 encounter (Appointment) with Arnoldo Lenis, MD.     Allergies:   Atorvastatin and Penicillins   Social History   Tobacco Use  . Smoking status: Former Smoker    Types: Cigarettes    Quit date: 2006    Years since quitting: 14.5  . Smokeless tobacco: Never Used  Substance Use Topics  . Alcohol use: No  . Drug use: No     Family Hx: The patient's family history includes CAD in his father; CVA in his father; Diabetes in his sister. There is no history of Colon cancer.  ROS:   Please see the history of present illness.    *** All other systems reviewed and are negative.   Prior CV studies:   The following studies were reviewed today:  06/2018 echo IMPRESSIONS   1. Severe hypokinesis of the left  ventricular, entire inferolateral wall. 2. The left ventricle has mild-moderately reduced systolic function, with an ejection fraction of 40-45%. The cavity size was normal. There is mild asymmetric left ventricular hypertrophy. Left ventricular diastolic Doppler parameters are consistent  with impaired relaxation. 3. The right ventricle has normal systolc function. The cavity was normal. There is no increase in right ventricular wall thickness. Right ventricular systolic pressure is moderately elevated with an estimated pressure of 63.7 mmHg. 4. Left atrial size was moderately dilated. 5. Right atrial size was moderately dilated. 6. The mitral valve is myxomatous. Mild thickening of the mitral valve leaflet. 7. The aortic valve is tricuspid Mild thickening of the aortic valve Mild calcification of the aortic valve. Aortic valve regurgitation is trivial by color flow Doppler. 8. There is mild dilatation of the aortic root measuring 40 mm. 9. The inferior vena cava was dilated in size with >50% respiratory variability. 10. No intracardiac thrombi or masses were visualized.   07/2018 cath  Significant three-vessel coronary artery disease.  Segmental 70% proximal to mid LAD. This is an intermediate stenosis but would likely have positive FFR if tested.  Total occlusion of the second and third obtuse marginal branches, the sources of the patient's recent non-ST elevation myocardial infarction. The  LAD supplies collaterals to the third obtuse marginal which is a large of the 3 obtuse marginals.  85% proximal RCA. Right dominant coronary anatomy.  No aortic valve gradient  Mildly elevated LVEDP  RECOMMENDATIONS:   Combination antiplatelet and antithrombotic therapy will place the patient at increased risk for recurrent bleeding. Requires combination therapy because of paroxysmal atrial flutter and significant underlying ischemic heart disease. Would not recommend either until  hemoglobin is stable and above 9 for at least 2 weeks. Would avoid aspirin. Perhaps low-dose rivaroxaban 2.5 mg twice daily and Plavix 75 mg/day would be his best option (based on the [facility] trial). [facility] used aspirin, but this patient's endoscopy has suggested aspirin will be worse than Plavix as far as bleeding risk is concerned. Low-dose rivaroxaban and Plavix has never been started as a stroke preventative measure.No good data in this particular clinical subset.  Anti-ischemic therapy with beta-blocker and long-acting nitrates as tolerated by blood pressure.  Aggressive secondary risk factor modification including high intensity statin therapy.  Will need clinical cardiology and gastroenterology follow-up to coordinate care. Cardiology follow-up should be within the next 4 weeks.  Hemoglobin in 2 and 4 weeks.  Labs/Other Tests and Data Reviewed:    EKG:  {EKG/Telemetry Strips Reviewed:[phone]}  Recent Labs: 08/05/2018: ALT 15; B Natriuretic Peptide 172.0; Magnesium 2.1 08/09/2018: BUN 15; Creatinine, Ser 0.88; Hemoglobin 8.1; Platelets 160; Potassium 3.6; Sodium 135   Recent Lipid Panel Lab Results  Component Value Date/Time   CHOL 135 07/10/2018 03:39 AM   TRIG 47 07/10/2018 03:39 AM   HDL 41 07/10/2018 03:39 AM   CHOLHDL 3.3 07/10/2018 03:39 AM   LDLCALC 85 07/10/2018 03:39 AM    Wt Readings from Last 3 Encounters:  09/24/18 210 lb (95.3 kg)  09/01/18 210 lb (95.3 kg)  08/13/18 210 lb (95.3 kg)     Objective:    Vital Signs:  There were no vitals taken for this visit.   {HeartCare Virtual Exam (Optional):[phone]::"VITAL SIGNS:  reviewed"}  ASSESSMENT & PLAN:    1. CAD/ICM -recent NSTEMI as reported above, management complicated by GI bleed and severe anemia. He had a cath as reported above but disease managed medically due to antiplatelet limitation from GI bleed -no DAPT due to recurrent issues with GI bleeding, severe anemia with multiple  transfusions  -we will request the cbc he had last week by pcp, if stable start plavix 75mg  daily.     2. Afib - anticoag on hold due to recent GI bleed - continue to hold anticoag at this time, would prioritize starting plavix if Hgb is stable.     Addendum: pcp labs reviewed, Hgb stable. We will start plavix 75mg  daily.   COVID-19 Education: The signs and symptoms of COVID-19 were discussed with the patient and how to seek care for testing (follow up with PCP or arrange E-visit).  ***The importance of social distancing was discussed today.  Time:   Today, I have spent *** minutes with the patient with telehealth technology discussing the above problems.     Medication Adjustments/Labs and Tests Ordered: Current medicines are reviewed at length with the patient today.  Concerns regarding medicines are outlined above.   Tests Ordered: No orders of the defined types were placed in this encounter.   Medication Changes: No orders of the defined types were placed in this encounter.   Follow Up:  {F/U Format:[phone]} {follow up:15908}  Signed, Carlyle Dolly, MD  10/14/2018 8:20 AM    [facility]

## 2018-10-18 ENCOUNTER — Encounter: Payer: Self-pay | Admitting: *Deleted

## 2018-10-18 ENCOUNTER — Telehealth: Payer: Self-pay | Admitting: Cardiology

## 2018-10-18 NOTE — Telephone Encounter (Signed)
We never heard back from them, did he he ever restart his plavix. If so I would have him stop it. Please message me when labs are available.    Zandra Abts MD

## 2018-10-18 NOTE — Telephone Encounter (Signed)
Patient's wife Joaquim Lai) called stating that he saw PCP 10-14-2018. ([facility])  Was told that his hemoglobin was very low. Patient was told to contact Dr. Harl Bowie.

## 2018-10-18 NOTE — Telephone Encounter (Signed)
Labs are in Epic - pt says he never restarted plavix

## 2018-10-18 NOTE — Telephone Encounter (Signed)
Requested labs from 78

## 2018-10-19 NOTE — Telephone Encounter (Signed)
Hgb is just a little lower than before, not a major change. Any signs of bleeding? Has he decided for good not to restart any blood thinners?   J Rumi Taras MD

## 2018-10-21 NOTE — Telephone Encounter (Signed)
Patient informed and says he has not seen any blood, no dark stools, no bleeding. Patient said he did not want to take blood thinners at this time.

## 2019-02-18 DIAGNOSIS — I509 Heart failure, unspecified: Secondary | ICD-10-CM | POA: Diagnosis not present

## 2019-02-18 DIAGNOSIS — E782 Mixed hyperlipidemia: Secondary | ICD-10-CM | POA: Diagnosis not present

## 2019-02-18 DIAGNOSIS — M1712 Unilateral primary osteoarthritis, left knee: Secondary | ICD-10-CM | POA: Diagnosis not present

## 2019-02-18 DIAGNOSIS — Z6828 Body mass index (BMI) 28.0-28.9, adult: Secondary | ICD-10-CM | POA: Diagnosis not present

## 2019-02-18 DIAGNOSIS — M1711 Unilateral primary osteoarthritis, right knee: Secondary | ICD-10-CM | POA: Diagnosis not present

## 2019-02-18 DIAGNOSIS — I4891 Unspecified atrial fibrillation: Secondary | ICD-10-CM | POA: Diagnosis not present

## 2019-02-18 DIAGNOSIS — I4892 Unspecified atrial flutter: Secondary | ICD-10-CM | POA: Diagnosis not present

## 2019-02-18 DIAGNOSIS — Z23 Encounter for immunization: Secondary | ICD-10-CM | POA: Diagnosis not present

## 2019-02-18 DIAGNOSIS — M79604 Pain in right leg: Secondary | ICD-10-CM | POA: Diagnosis not present

## 2019-03-14 DIAGNOSIS — I4891 Unspecified atrial fibrillation: Secondary | ICD-10-CM | POA: Diagnosis not present

## 2019-03-14 DIAGNOSIS — I509 Heart failure, unspecified: Secondary | ICD-10-CM | POA: Diagnosis not present

## 2019-04-14 DIAGNOSIS — I509 Heart failure, unspecified: Secondary | ICD-10-CM | POA: Diagnosis not present

## 2019-04-14 DIAGNOSIS — I4891 Unspecified atrial fibrillation: Secondary | ICD-10-CM | POA: Diagnosis not present

## 2019-12-20 ENCOUNTER — Encounter: Payer: Self-pay | Admitting: Cardiology

## 2019-12-20 ENCOUNTER — Ambulatory Visit: Payer: Medicare PPO | Admitting: Cardiology

## 2019-12-20 ENCOUNTER — Encounter: Payer: Self-pay | Admitting: *Deleted

## 2019-12-20 VITALS — BP 148/72 | HR 54 | Ht 72.0 in | Wt 204.2 lb

## 2019-12-20 DIAGNOSIS — I2510 Atherosclerotic heart disease of native coronary artery without angina pectoris: Secondary | ICD-10-CM | POA: Diagnosis not present

## 2019-12-20 DIAGNOSIS — I4891 Unspecified atrial fibrillation: Secondary | ICD-10-CM | POA: Diagnosis not present

## 2019-12-20 NOTE — Patient Instructions (Signed)
Your physician wants you to follow-up in: 6 MONTHS WITH DR BRANCH   Your physician recommends that you continue on your current medications as directed. Please refer to the Current Medication list given to you today.  Thank you for choosing [facility]!!   

## 2019-12-20 NOTE — Progress Notes (Signed)
Clinical Summary Mr. Golson is a 85 y.o.male seen today for a focused visit on recent NSTEMI and GI bleed.  1. CAD/ICM -abnormal stress test 2018 intermediate risk managed medcally - admitted 06/2018 with epgiastric and chest pain, somewhat atypical chest pain symptoms -significant EKG changes, aVR ST elevation and diffuse ST depression suggesting LM or multivessel disease. Eventual severe troponin elevation peak 60 - management complicated by severe anemia, Hgb 5.5   - eventual cath 07/2018 showed ostial LAD 70%, OM2 100%, , OM3 100%, RCA 85%. Cultprit thought to be occluded OM2 and OM3. OM3 has colalterals from LAD. Managed medically due to GI bleed - 06/2018 echo LVEF 41-28%, grade I diastolic dysfunction, PASP 64. Aortic root 40 mm - LVEF in 03/2018 was 55-60%   - admitted again late 07/2018 with recurrent anemia and elevated troponin with chest pain -plavix and xarelto were held due to GI bleed   - no recent chest pain. No SOB or DOE. - compliant with meds. We had discussed restarting plavix at prior visit but does not appear that he did.    2. Atrial arrythmias history of atrial arrythmias including afib/aflutter/MAT - off anticoag due to prior GI bleed  - denies any palpitations.     3. Anemia/GI bleed -EGD 03/27 showed mild gastritis, duodenitis and mild esophagitis. -Colonoscopy showed few colon polyps which was removed with cold snare and biopsy forcep, medium sized lipomaat hepaticflexure, diverticulosis and large internal hemorrhoids.  - admitted again late 07/2018 with anemia. Hgb 5.2, received 4 unirt pRBCs. capsule endoscopy shows erythematous nonerosive gastritis and a strand of adherent blood in the proximal duodenum within the area examined at the last EGD. GI recommends strict avoidance of Alka-Seltzer and other ulcerogenic medications and increase PPI to twice daily indefinitely, add sucralfate for 2 weeks and continue as  recommended.   - no recent bleeding. He reports labs with pcp in July.    Has had covid vaccine x 2.  Past Medical History:  Diagnosis Date  . Acute diastolic CHF (congestive heart failure) ([place]) 02/11/2017  . Anemia    CHRONIC   . Atrial flutter ([place])    a. diagnosed in 02/2017. Rate-control strategy pursued.   Marland Kitchen BPH (benign prostatic hyperplasia)   . Chronic diastolic heart failure ([place])   . Dyspnea   . Hypertension   . NSTEMI (non-ST elevated myocardial infarction) ([place])   . Renal disorder    cyst on kidney     Allergies  Allergen Reactions  . Atorvastatin     Severe leg pain/ache  . Penicillins     Has patient had a PCN reaction causing immediate rash, facial/tongue/throat swelling, SOB or lightheadedness with hypotension: Yes Has patient had a PCN reaction causing severe rash involving mucus membranes or skin necrosis: No Has patient had a PCN reaction that required hospitalization: No Has patient had a PCN reaction occurring within the last 10 years: No If all of the above answers are "NO", then may proceed with Cephalosporin use.     Current Outpatient Medications  Medication Sig Dispense Refill  . alum & mag hydroxide-simeth (MAALOX/MYLANTA) 200-200-20 MG/5ML suspension Take 30 mLs by mouth every 4 (four) hours as needed for indigestion or heartburn. 355 mL 0  . budesonide-formoterol (SYMBICORT) 160-4.5 MCG/ACT inhaler Inhale 2 puffs into the lungs 2 (two) times daily.    . clopidogrel (PLAVIX) 75 MG tablet Take 75 mg by mouth daily.    . finasteride (PROSCAR) 5 MG tablet Take 5  mg by mouth daily.    . furosemide (LASIX) 80 MG tablet Take 0.5-1 tablets (40-80 mg total) by mouth daily. Take 40 mg p.o. daily on Monday, Wednesday, Friday, and Sunday.  Take Lasix 80 mg p.o. daily on Tuesday, Thursday, and Saturday 90 tablet 1  . Glucosamine-Chondroit-Vit C-Mn (GLUCOSAMINE 1500 COMPLEX) CAPS Take 1 capsule by mouth 2 (two) times daily.    . isosorbide mononitrate  (IMDUR) 30 MG 24 hr tablet Take 1 tablet (30 mg total) by mouth at bedtime. 90 tablet 3  . losartan (COZAAR) 25 MG tablet Take 1 tablet (25 mg total) by mouth daily. 30 tablet 0  . metoprolol succinate (TOPROL XL) 25 MG 24 hr tablet Take 1 tablet (25 mg total) by mouth daily. 90 tablet 1  . nitroGLYCERIN (NITROSTAT) 0.4 MG SL tablet Place 1 tablet (0.4 mg total) under the tongue every 5 (five) minutes as needed for chest pain (CP or SOB). 20 tablet 0  . oxyCODONE (OXY IR/ROXICODONE) 5 MG immediate release tablet Take 1 tablet (5 mg total) by mouth every 4 (four) hours as needed for moderate pain. 5 tablet 0  . pantoprazole (PROTONIX) 40 MG tablet Take 1 tablet (40 mg total) by mouth 2 (two) times daily. 60 tablet 0  . potassium chloride (K-DUR,KLOR-CON) 20 MEQ tablet Take 1 tablet (20 mEq total) by mouth daily. 30 tablet 0  . rosuvastatin (CRESTOR) 5 MG tablet Take 1 tablet (5 mg total) by mouth every other day. 45 tablet 1  . sucralfate (CARAFATE) 1 g tablet Take 1 tablet (1 g total) by mouth 4 (four) times daily. 120 tablet 0  . vitamin B-12 (CYANOCOBALAMIN) 500 MCG tablet Take 1 tablet (500 mcg total) by mouth daily. 30 tablet 1   No current facility-administered medications for this visit.     Past Surgical History:  Procedure Laterality Date  . ABCESS DRAINAGE  11/2008   BUTTOCKS  . BIOPSY  07/13/2018   Procedure: BIOPSY;  Surgeon: Otis Brace, MD;  Location: [facility];  Service: Gastroenterology;;  . CATARACT EXTRACTION, BILATERAL    . COLONOSCOPY N/A 04/02/2017   Procedure: COLONOSCOPY;  Surgeon: Rogene Houston, MD;  [facility];  Service: Endoscopy;  Laterality: N/A;  10:55  . COLONOSCOPY WITH PROPOFOL N/A 07/13/2018   Procedure: COLONOSCOPY WITH PROPOFOL;  Surgeon: Otis Brace, MD;  Location: [facility];  Service: Gastroenterology;  Laterality: N/A;  . ESOPHAGOGASTRODUODENOSCOPY (EGD) WITH PROPOFOL N/A 07/09/2018   Procedure: ESOPHAGOGASTRODUODENOSCOPY  (EGD) WITH PROPOFOL;  Surgeon: Wilford Corner, MD;  Location: [facility];  Service: Endoscopy;  Laterality: N/A;  . GIVENS CAPSULE STUDY N/A 08/06/2018   Procedure: GIVENS CAPSULE STUDY;  Surgeon: Ronald Lobo, MD;  Location: [facility];  Service: Endoscopy;  Laterality: N/A;  . LEFT HEART CATH AND CORONARY ANGIOGRAPHY N/A 07/14/2018   Procedure: LEFT HEART CATH AND CORONARY ANGIOGRAPHY;  Surgeon: Belva Crome, MD;  Location: [facility];  Service: Cardiovascular;  Laterality: N/A;  . POLYPECTOMY  04/02/2017   Procedure: POLYPECTOMY;  Surgeon: Rogene Houston, MD;  [facility];  Service: Endoscopy;;  asceding colon(hot snare)/ sigmoid colon times two (cold snare)  . POLYPECTOMY  07/13/2018   Procedure: POLYPECTOMY;  Surgeon: Otis Brace, MD;  Location: [facility];  Service: Gastroenterology;;     Allergies  Allergen Reactions  . Atorvastatin     Severe leg pain/ache  . Penicillins     Has patient had a PCN reaction causing immediate rash, facial/tongue/throat swelling, SOB  or lightheadedness with hypotension: Yes Has patient had a PCN reaction causing severe rash involving mucus membranes or skin necrosis: No Has patient had a PCN reaction that required hospitalization: No Has patient had a PCN reaction occurring within the last 10 years: No If all of the above answers are "NO", then may proceed with Cephalosporin use.      Family History  Problem Relation Age of Onset  . CVA Father   . CAD Father   . Diabetes Sister   . Colon cancer Neg Hx      Social History Mr. Hendon reports that he quit smoking about 15 years ago. His smoking use included cigarettes. He has never used smokeless tobacco. Mr. Schneller reports no history of alcohol use.   Review of Systems CONSTITUTIONAL: No weight loss, fever, chills, weakness or fatigue.  HEENT: Eyes: No visual loss, blurred vision, double vision or yellow sclerae.No hearing loss, sneezing, congestion,  runny nose or sore throat.  SKIN: No rash or itching.  CARDIOVASCULAR: per hpi RESPIRATORY: No shortness of breath, cough or sputum.  GASTROINTESTINAL: No anorexia, nausea, vomiting or diarrhea. No abdominal pain or blood.  GENITOURINARY: No burning on urination, no polyuria NEUROLOGICAL: No headache, dizziness, syncope, paralysis, ataxia, numbness or tingling in the extremities. No change in bowel or bladder control.  MUSCULOSKELETAL: No muscle, back pain, joint pain or stiffness.  LYMPHATICS: No enlarged nodes. No history of splenectomy.  PSYCHIATRIC: No history of depression or anxiety.  ENDOCRINOLOGIC: No reports of sweating, cold or heat intolerance. No polyuria or polydipsia.  Marland Kitchen   Physical Examination Today's Vitals   12/20/19 0923  BP: (!) 148/72  Pulse: (!) 54  SpO2: 96%  Weight: 204 lb 3.2 oz (92.6 kg)  Height: 6' (1.829 m)   Body mass index is 27.69 kg/m.  Gen: resting comfortably, no acute distress HEENT: no scleral icterus, pupils equal round and reactive, no palptable cervical adenopathy,  CV: RRR, no m/r/g, no jvd Resp: Clear to auscultation bilaterally GI: abdomen is soft, non-tender, non-distended, normal bowel sounds, no hepatosplenomegaly MSK: extremities are warm, no edema.  Skin: warm, no rash Neuro:  no focal deficits Psych: appropriate affect   Diagnostic Studies 06/2018 echo IMPRESSIONS   1. Severe hypokinesis of the left ventricular, entire inferolateral wall. 2. The left ventricle has mild-moderately reduced systolic function, with an ejection fraction of 40-45%. The cavity size was normal. There is mild asymmetric left ventricular hypertrophy. Left ventricular diastolic Doppler parameters are consistent  with impaired relaxation. 3. The right ventricle has normal systolc function. The cavity was normal. There is no increase in right ventricular wall thickness. Right ventricular systolic pressure is moderately elevated with an estimated  pressure of 63.7 mmHg. 4. Left atrial size was moderately dilated. 5. Right atrial size was moderately dilated. 6. The mitral valve is myxomatous. Mild thickening of the mitral valve leaflet. 7. The aortic valve is tricuspid Mild thickening of the aortic valve Mild calcification of the aortic valve. Aortic valve regurgitation is trivial by color flow Doppler. 8. There is mild dilatation of the aortic root measuring 40 mm. 9. The inferior vena cava was dilated in size with >50% respiratory variability. 10. No intracardiac thrombi or masses were visualized.   07/2018 cath  Significant three-vessel coronary artery disease.  Segmental 70% proximal to mid LAD. This is an intermediate stenosis but would likely have positive FFR if tested.  Total occlusion of the second and third obtuse marginal branches, the sources of the patient's recent non-ST  elevation myocardial infarction. The LAD supplies collaterals to the third obtuse marginal which is a large of the 3 obtuse marginals.  85% proximal RCA. Right dominant coronary anatomy.  No aortic valve gradient  Mildly elevated LVEDP  RECOMMENDATIONS:   Combination antiplatelet and antithrombotic therapy will place the patient at increased risk for recurrent bleeding. Requires combination therapy because of paroxysmal atrial flutter and significant underlying ischemic heart disease. Would not recommend either until hemoglobin is stable and above 9 for at least 2 weeks. Would avoid aspirin. Perhaps low-dose rivaroxaban 2.5 mg twice daily and Plavix 75 mg/day would be his best option (based on the [facility] trial). [facility] used aspirin, but this patient's endoscopy has suggested aspirin will be worse than Plavix as far as bleeding risk is concerned. Low-dose rivaroxaban and Plavix has never been started as a stroke preventative measure.No good data in this particular clinical subset.  Anti-ischemic therapy with beta-blocker and  long-acting nitrates as tolerated by blood pressure.  Aggressive secondary risk factor modification including high intensity statin therapy.  Will need clinical cardiology and gastroenterology follow-up to coordinate care. Cardiology follow-up should be within the next 4 weeks.  Hemoglobin in 2 and 4 weeks.    Assessment and Plan  1. CAD/ICM -prior NSTEMI as reported above, management complicated by GI bleed and severe anemia. He had a cath as reported above but disease managed medically due to antiplatelet limitation from GI bleed  - no recent symptoms, continue medical therap.y   2. Afib - anticoag on hold due to recent GI bleed - over a year now since his GI bleed, we will retry eliquis 5mg  bid after we review his most recent cbc from his pcp         Arnoldo Lenis, M.D.

## 2019-12-21 ENCOUNTER — Encounter: Payer: Self-pay | Admitting: Cardiology

## 2019-12-23 ENCOUNTER — Telehealth: Payer: Self-pay | Admitting: *Deleted

## 2019-12-23 DIAGNOSIS — I4891 Unspecified atrial fibrillation: Secondary | ICD-10-CM

## 2019-12-23 MED ORDER — APIXABAN 5 MG PO TABS: 5.0000 mg | ORAL_TABLET | Freq: Two times a day (BID) | ORAL | 1 refills | Status: DC

## 2019-12-23 MED ORDER — APIXABAN 5 MG PO TABS: 5.0000 mg | ORAL_TABLET | Freq: Two times a day (BID) | ORAL | 0 refills | Status: DC

## 2019-12-23 NOTE — Telephone Encounter (Signed)
Pt voiced understanding and will come by office for samples and lab orders  can we let this patient know his blood counts looked fine at his pcp office, can we start eliquis 5mg  bid, needs cbc 3 weeks

## 2020-04-16 IMAGING — CT CT ABDOMEN AND PELVIS WITH CONTRAST
2 of 5 series · 16 of 46 positions shown, 18 images · IV contrast (omnipaque)
Comparison: None.

CLINICAL DATA: Unexplained anemia

EXAM:
CT ABDOMEN AND PELVIS WITH CONTRAST
TECHNIQUE: Multidetector CT imaging of the abdomen and pelvis was performed
using the standard protocol following bolus administration of
intravenous contrast.
CONTRAST:  100mL OMNIPAQUE IOHEXOL 300 MG/ML SOLN, additional oral
enteric contrast

[Series 5: abdomen 3.0 mpr cor · coronal · 0.82mm/px · 3 of 107 slices shown]
[im 36/107  soft-tissue]
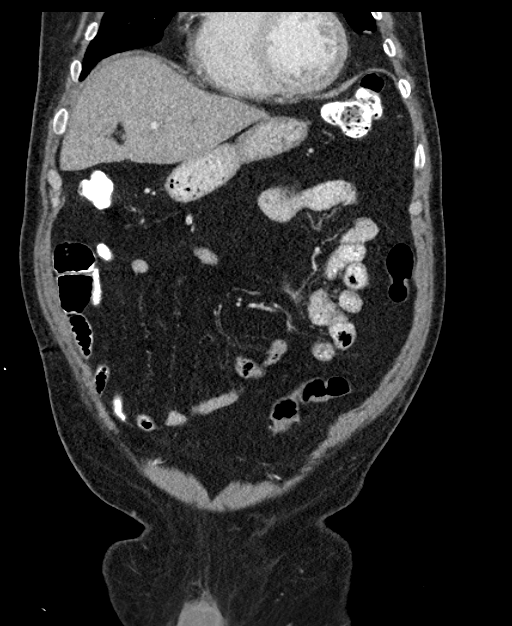
[im 48/107  soft-tissue]
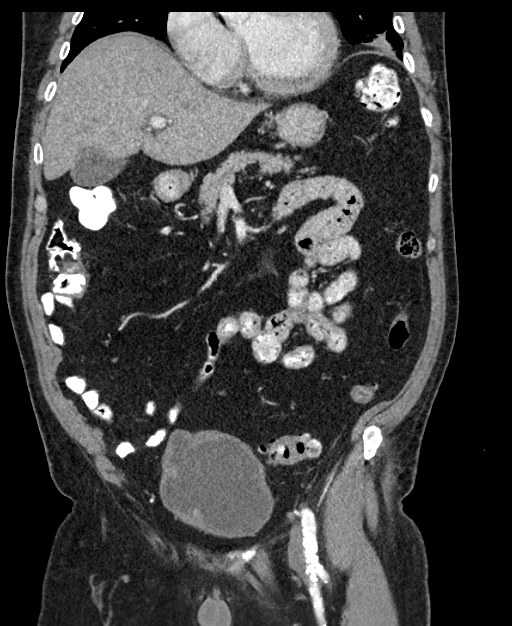
[im 59/107  soft-tissue]
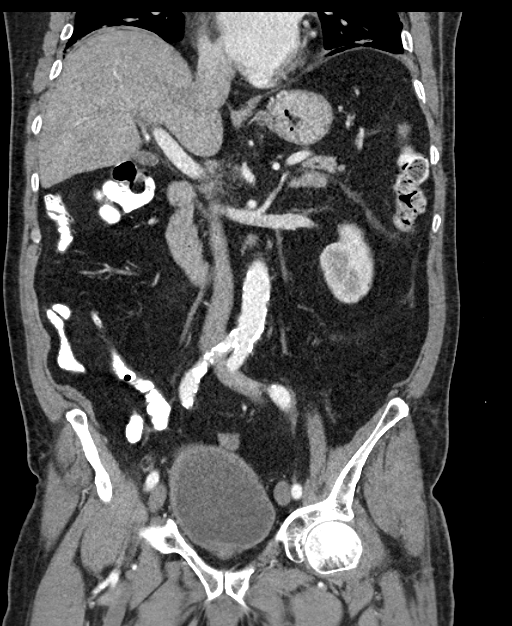

[Series 7: abdomen 5.0 · axial · 0.84mm/px · z∈[+864,+1319]mm · 13 of 103 slices shown, 15 images]
[im 6/103  soft-tissue]
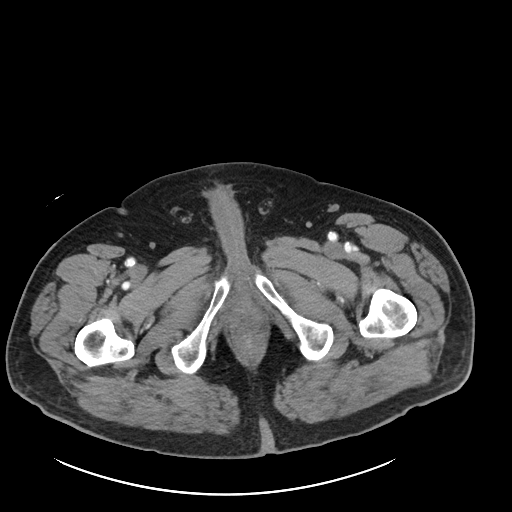
[im 6/103  bone]
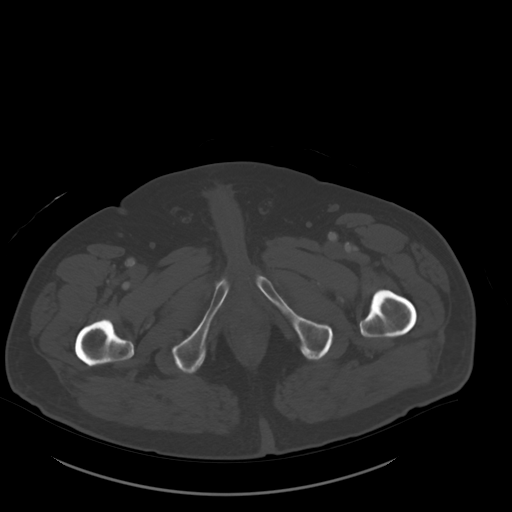
[im 12/103  soft-tissue]
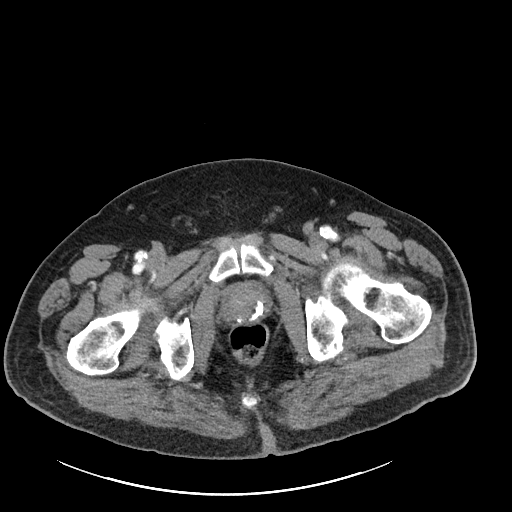
[im 23/103  soft-tissue]
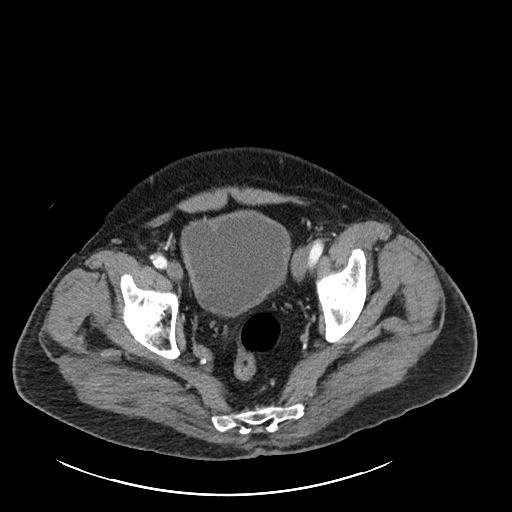
[im 29/103  soft-tissue]
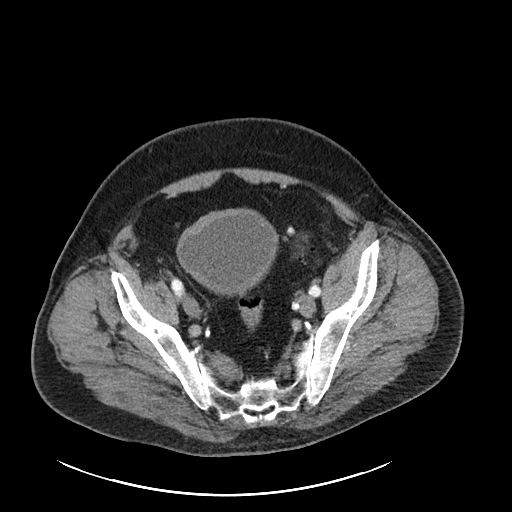
[im 35/103  soft-tissue]
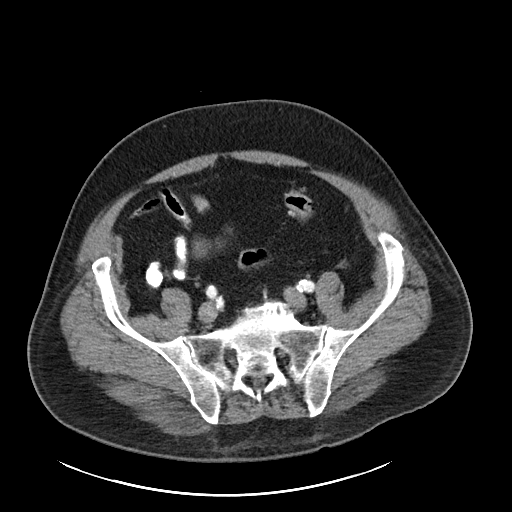
[im 46/103  soft-tissue]
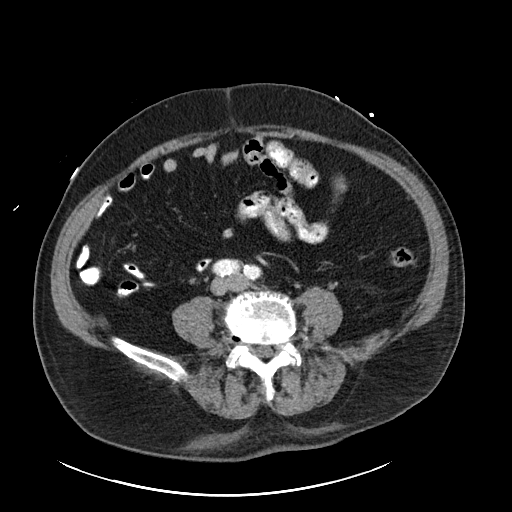
[im 52/103  soft-tissue]
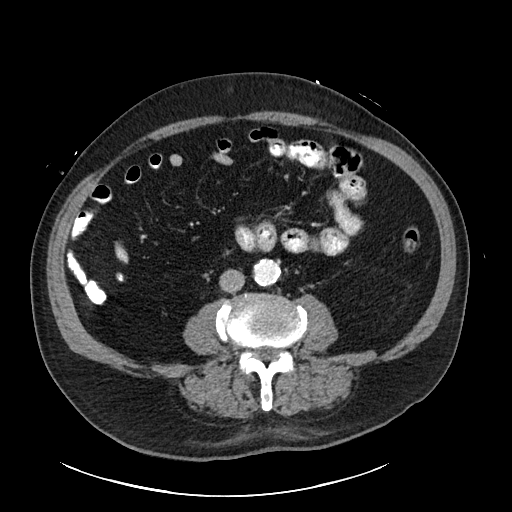
[im 57/103  soft-tissue]
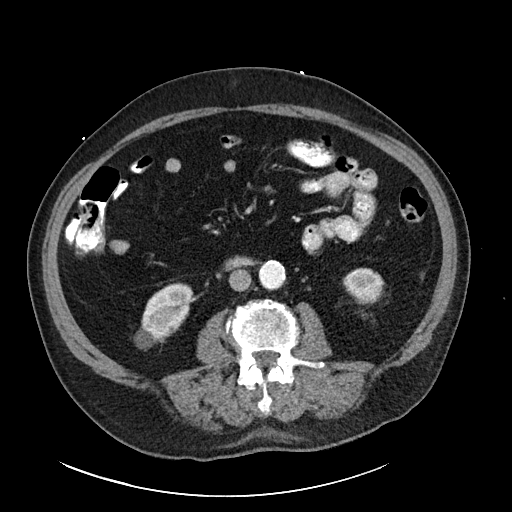
[im 69/103  soft-tissue]
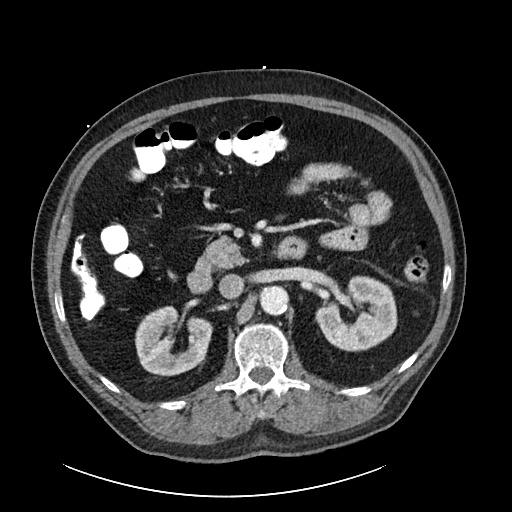
[im 69/103  bone]
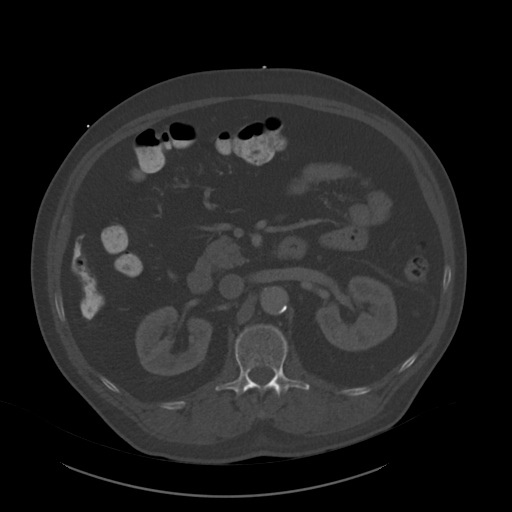
[im 74/103  soft-tissue]
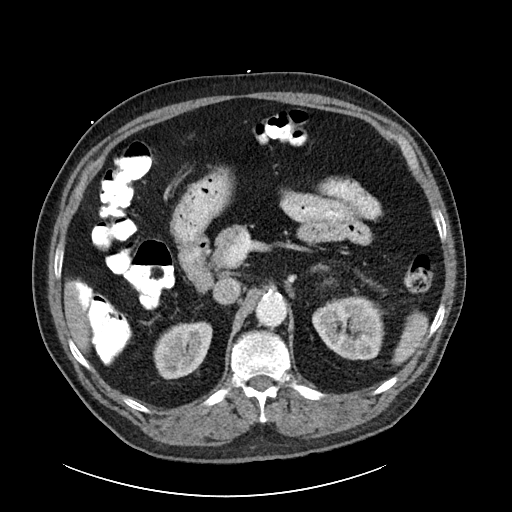
[im 80/103  soft-tissue]
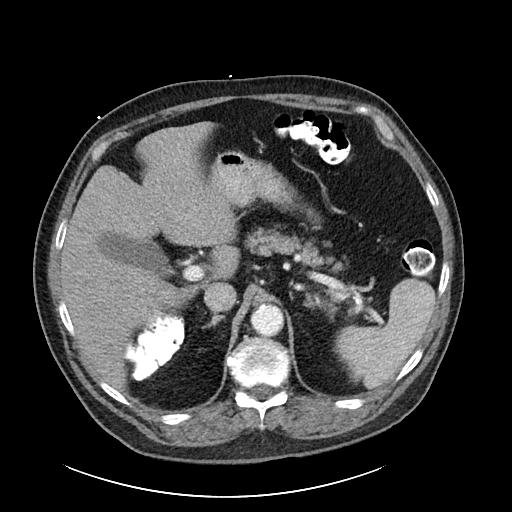
[im 91/103  soft-tissue]
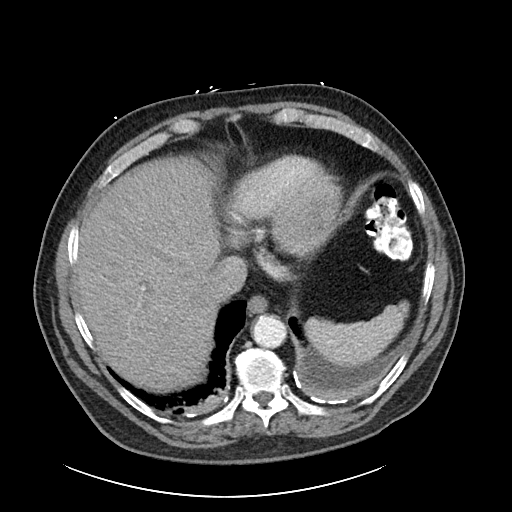
[im 97/103  soft-tissue]
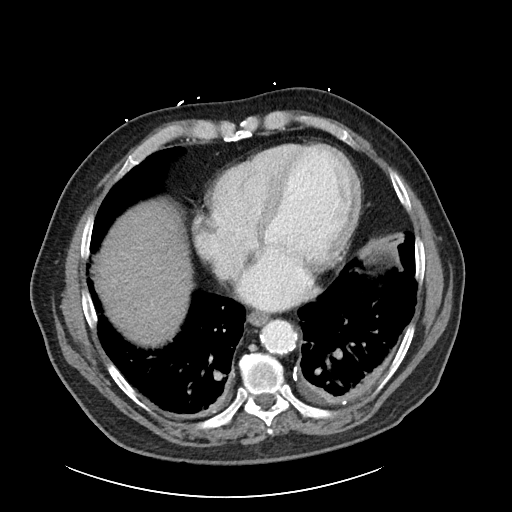

[16 of 46 positions shown; findings below may reference images not displayed]

FINDINGS: Lower chest: Small bilateral pleural effusions, pleural
calcifications, and associated atelectasis or consolidation.
Coronary artery calcifications.

Hepatobiliary: No focal liver abnormality is seen. No gallstones,
gallbladder wall thickening, or biliary dilatation.

Pancreas: Unremarkable. No pancreatic ductal dilatation or
surrounding inflammatory changes.

Spleen: Normal in size without focal abnormality.

Adrenals/Urinary Tract: 2.9 cm soft tissue attenuation nodule of the
left adrenal gland (series 7, image 28). Kidneys are normal, without
renal calculi, focal lesion, or hydronephrosis. Thickening of the
urinary bladder, likely related to chronic outlet obstruction.

Stomach/Bowel: Stomach is within normal limits. Appendix appears
normal. No evidence of bowel wall thickening, distention, or
inflammatory changes. Sigmoid diverticulosis without evidence of
acute diverticulitis.

Vascular/Lymphatic: Mixed calcific atherosclerosis. No enlarged
abdominal or pelvic lymph nodes.

Reproductive: No mass or other abnormality.

Other: No abdominal wall hernia or abnormality. No abdominopelvic
ascites.

Musculoskeletal: No acute or significant osseous findings.
IMPRESSION: 1. No CT findings of the abdomen or pelvis to explain anemia.
2. There is a 2.9 cm soft tissue attenuation nodule in the left
adrenal gland. Recommend further evaluation with adrenal protocol CT
or MRI if clinically appropriate. This recommendation follows ACR
consensus guidelines: Management of Incidental Adrenal Masses: A
White Paper of the ACR Incidental Findings Committee. [HOSPITAL] 5229;[id number].
3. Small bilateral pleural effusions with associated atelectasis or
consolidation, likely chronic with calcification of the pleura.
4. Other chronic and incidental findings as detailed above.

## 2020-04-26 ENCOUNTER — Other Ambulatory Visit: Payer: Self-pay | Admitting: Cardiology

## 2020-05-02 ENCOUNTER — Telehealth: Payer: Self-pay | Admitting: Cardiology

## 2020-05-02 NOTE — Telephone Encounter (Signed)
Needing ELIQUIS 5 MG TABS tablet [[id number]]  Send to [facility]

## 2020-05-02 NOTE — Telephone Encounter (Signed)
Re-faxed 04/26/2020 eliquis rx to [facility]

## 2020-05-31 ENCOUNTER — Other Ambulatory Visit: Payer: Self-pay

## 2020-05-31 ENCOUNTER — Encounter: Payer: Self-pay | Admitting: Urology

## 2020-05-31 ENCOUNTER — Ambulatory Visit (INDEPENDENT_AMBULATORY_CARE_PROVIDER_SITE_OTHER): Payer: Medicare Other | Admitting: Urology

## 2020-05-31 VITALS — BP 140/76 | HR 63 | Temp 98.5°F | Ht 72.0 in | Wt 200.0 lb

## 2020-05-31 DIAGNOSIS — N281 Cyst of kidney, acquired: Secondary | ICD-10-CM

## 2020-05-31 DIAGNOSIS — R319 Hematuria, unspecified: Secondary | ICD-10-CM

## 2020-05-31 LAB — MICROSCOPIC EXAMINATION: Renal Epithel, UA: NONE SEEN /hpf

## 2020-05-31 LAB — URINALYSIS, ROUTINE W REFLEX MICROSCOPIC
Bilirubin, UA: NEGATIVE
Glucose, UA: NEGATIVE
Ketones, UA: NEGATIVE
Nitrite, UA: POSITIVE — AB
Specific Gravity, UA: 1.015 (ref 1.005–1.030)
Urobilinogen, Ur: 0.2 mg/dL (ref 0.2–1.0)
pH, UA: 5.5 (ref 5.0–7.5)

## 2020-05-31 NOTE — Progress Notes (Signed)
Subjective: 1. Hematuria, unspecified type   2. Renal cyst     Michael Stephens is an 86 yo male who presents with a recent history of gross hematuria about 2 weeks ago.  Michael Stephens had 2-3 bloody voids and then Michael Stephens took and OTC kidney pill and it stopped.   Michael Stephens was having fairly severe RLQ pain that has resolved.  Michael Stephens had no nausea or vomiting.  Michael Stephens is voiding well.  Michael Stephens has a history of a renal cyst but no stones.  His UA today has 6-10 WBC, 11-30 RBC and a few bacteria.  Michael Stephens had a CT AP in 3/20 that showed right renal cysts, but no renal or ureteral stones, some prostate stones and an irregular bladder wall with some small diverticuli.   Michael Stephens is on finasteride for BPH.     ROS:  ROS  Allergies  Allergen Reactions  . Atorvastatin     Severe leg pain/ache  . Penicillins     Has patient had a PCN reaction causing immediate rash, facial/tongue/throat swelling, SOB or lightheadedness with hypotension: Yes Has patient had a PCN reaction causing severe rash involving mucus membranes or skin necrosis: No Has patient had a PCN reaction that required hospitalization: No Has patient had a PCN reaction occurring within the last 10 years: No If all of the above answers are "NO", then may proceed with Cephalosporin use.    Past Medical History:  Diagnosis Date  . Acute diastolic CHF (congestive heart failure) ([place]) 02/11/2017  . Anemia    CHRONIC   . Atrial flutter ([place])    a. diagnosed in 02/2017. Rate-control strategy pursued.   Marland Kitchen BPH (benign prostatic hyperplasia)   . Chronic diastolic heart failure ([place])   . Dyspnea   . Hypertension   . NSTEMI (non-ST elevated myocardial infarction) ([place])   . Renal disorder    cyst on kidney    Past Surgical History:  Procedure Laterality Date  . ABCESS DRAINAGE  11/2008   BUTTOCKS  . BIOPSY  07/13/2018   Procedure: BIOPSY;  Surgeon: Otis Brace, MD;  [facility];  Service: Gastroenterology;;  . CATARACT EXTRACTION, BILATERAL    . COLONOSCOPY N/A  04/02/2017   Procedure: COLONOSCOPY;  Surgeon: Rogene Houston, MD;  [facility];  Service: Endoscopy;  Laterality: N/A;  10:55  . COLONOSCOPY WITH PROPOFOL N/A 07/13/2018   Procedure: COLONOSCOPY WITH PROPOFOL;  Surgeon: Otis Brace, MD;  Location: [place];  Service: Gastroenterology;  Laterality: N/A;  . ESOPHAGOGASTRODUODENOSCOPY (EGD) WITH PROPOFOL N/A 07/09/2018   Procedure: ESOPHAGOGASTRODUODENOSCOPY (EGD) WITH PROPOFOL;  Surgeon: Wilford Corner, MD;  Location: [place];  Service: Endoscopy;  Laterality: N/A;  . GIVENS CAPSULE STUDY N/A 08/06/2018   Procedure: GIVENS CAPSULE STUDY;  Surgeon: Ronald Lobo, MD;  Location: [place];  Service: Endoscopy;  Laterality: N/A;  . LEFT HEART CATH AND CORONARY ANGIOGRAPHY N/A 07/14/2018   Procedure: LEFT HEART CATH AND CORONARY ANGIOGRAPHY;  Surgeon: Belva Crome, MD;  Location: [facility];  Service: Cardiovascular;  Laterality: N/A;  . POLYPECTOMY  04/02/2017   Procedure: POLYPECTOMY;  Surgeon: Rogene Houston, MD;  [facility];  Service: Endoscopy;;  asceding colon(hot snare)/ sigmoid colon times two (cold snare)  . POLYPECTOMY  07/13/2018   Procedure: POLYPECTOMY;  Surgeon: Otis Brace, MD;  [facility];  Service: Gastroenterology;;    Social History   Socioeconomic History  . Marital status: Married    Spouse name: Not on file  .  Number of children: Not on file  . Years of education: Not on file  . Highest education level: Not on file  Occupational History  . Not on file  Tobacco Use  . Smoking status: Former Smoker    Types: Cigarettes    Quit date: 2006    Years since quitting: 16.1  . Smokeless tobacco: Never Used  Vaping Use  . Vaping Use: Never used  Substance and Sexual Activity  . Alcohol use: No  . Drug use: No  . Sexual activity: Not Currently  Other Topics Concern  . Not on file  Social History Narrative  . Not on file   Social Determinants of  Health   Financial Resource Strain: Not on file  Food Insecurity: Not on file  Transportation Needs: Not on file  Physical Activity: Not on file  Stress: Not on file  Social Connections: Not on file  Intimate Partner Violence: Not on file    Family History  Problem Relation Age of Onset  . CVA Father   . CAD Father   . Diabetes Sister   . Colon cancer Neg Hx     Anti-infectives: Anti-infectives (From admission, onward)   None      Current Outpatient Medications  Medication Sig Dispense Refill  . alum & mag hydroxide-simeth (MAALOX/MYLANTA) 200-200-20 MG/5ML suspension Take 30 mLs by mouth every 4 (four) hours as needed for indigestion or heartburn. 355 mL 0  . ELIQUIS 5 MG TABS tablet TAKE 1 TABLET BY MOUTH  TWICE DAILY 180 tablet 3  . finasteride (PROSCAR) 5 MG tablet Take 5 mg by mouth daily.    . furosemide (LASIX) 80 MG tablet Take 0.5-1 tablets (40-80 mg total) by mouth daily. Take 40 mg p.o. daily on Monday, Wednesday, Friday, and Sunday.  Take Lasix 80 mg p.o. daily on Tuesday, Thursday, and Saturday 90 tablet 1  . isosorbide mononitrate (IMDUR) 30 MG 24 hr tablet Take 1 tablet (30 mg total) by mouth at bedtime. 90 tablet 3  . losartan (COZAAR) 25 MG tablet Take 1 tablet (25 mg total) by mouth daily. 30 tablet 0  . metoprolol succinate (TOPROL XL) 25 MG 24 hr tablet Take 1 tablet (25 mg total) by mouth daily. 90 tablet 1  . potassium chloride (K-DUR,KLOR-CON) 20 MEQ tablet Take 1 tablet (20 mEq total) by mouth daily. 30 tablet 0  . rosuvastatin (CRESTOR) 5 MG tablet Take 1 tablet (5 mg total) by mouth every other day. 45 tablet 1  . isosorbide mononitrate (IMDUR) 30 MG 24 hr tablet Take 1 tablet (30 mg total) by mouth at bedtime. 90 tablet 3  . losartan (COZAAR) 25 MG tablet Take 1 tablet (25 mg total) by mouth daily. 30 tablet 0  . metoprolol succinate (TOPROL XL) 25 MG 24 hr tablet Take 1 tablet (25 mg total) by mouth daily. 90 tablet 1  . potassium chloride  (K-DUR,KLOR-CON) 20 MEQ tablet Take 1 tablet (20 mEq total) by mouth daily. 30 tablet 0  . rosuvastatin (CRESTOR) 5 MG tablet Take 1 tablet (5 mg total) by mouth every other day. 45 tablet 1   No current facility-administered medications for this visit.     Objective: Vital signs in last 24 hours: BP 140/76   Pulse 63   Temp 98.5 F (36.9 C)   Ht 6' (1.829 m)   Wt 200 lb (90.7 kg)   BMI 27.12 kg/m   Intake/Output from previous day: No intake/output data recorded. Intake/Output this shift: @IOTHISSHIFT @  Physical Exam Vitals reviewed.  Constitutional:      Appearance: Normal appearance.  Cardiovascular:     Rate and Rhythm: Normal rate.  Pulmonary:     Effort: Pulmonary effort is normal.     Breath sounds: Normal breath sounds.  Abdominal:     General: Abdomen is flat.     Palpations: There is no mass.     Tenderness: There is no abdominal tenderness. There is no right CVA tenderness or left CVA tenderness.  Genitourinary:    Comments: Normal phallus with adequate meatus. Scrotum, testes and epididymis normal. AP without lesions. NST without mass. Prostate 2+ firm and smooth. SV non-palpable. Musculoskeletal:        General: No swelling or tenderness. Normal range of motion.     Cervical back: Normal range of motion and neck supple.  Skin:    General: Skin is warm and dry.  Neurological:     General: No focal deficit present.     Mental Status: Michael Stephens is alert and oriented to person, place, and time.  Psychiatric:        Mood and Affect: Mood normal.        Behavior: Behavior normal.     Lab Results:  UA reviewed.  Results for orders placed or performed in visit on 05/31/20 (from the past 24 hour(s))  Urinalysis, Routine w reflex microscopic     Status: Abnormal   Collection Time: 05/31/20  2:13 PM  Result Value Ref Range   Specific Gravity, UA 1.015 1.005 - 1.030   pH, UA 5.5 5.0 - 7.5   Color, UA Yellow Yellow   Appearance Ur Clear Clear    Leukocytes,UA 1+ (A) Negative   Protein,UA 1+ (A) Negative/Trace   Glucose, UA Negative Negative   Ketones, UA Negative Negative   RBC, UA 2+ (A) Negative   Bilirubin, UA Negative Negative   Urobilinogen, Ur 0.2 0.2 - 1.0 mg/dL   Nitrite, UA Positive (A) Negative   Microscopic Examination See below:    Narrative   Performed at:  [facility] [address], [place], [place]  [id number] Lab Director: Mina Marble MT, Phone:  [phone]  Microscopic Examination     Status: Abnormal   Collection Time: 05/31/20  2:13 PM   Urine  Result Value Ref Range   WBC, UA 6-10 (A) 0 - 5 /hpf   RBC 11-30 (A) 0 - 2 /hpf   Epithelial Cells (non renal) 0-10 0 - 10 /hpf   Renal Epithel, UA None seen None seen /hpf   Bacteria, UA Few None seen/Few   Narrative   Performed at:  [facility] [address] [place], [place]  [id number] Lab Director: [place], Phone:  [phone]    Studies/Results: CT films and report from 3/20 reviewed.    Assessment/Plan: Gross hematuria with possible UTI.   Urine culture today and I will treat if positive.   I will get him set up for a CT hematuria study with f/u for possible cystoscopy.  There was some bladder wall irregularity on his prior CT.   Right renal cysts.  They will be reassess with CT.  No orders of the defined types were placed in this encounter.    Orders Placed This Encounter  Procedures  . Urine Culture  . CT HEMATURIA WORKUP    Standing Status:   Future    Standing Expiration Date:   06/28/2020    Order Specific Question:  Reason for Exam (SYMPTOM  OR DIAGNOSIS REQUIRED)    Answer:   gross hematuria.    Order Specific Question:   Preferred imaging location?    Answer:   [facility]    Order Specific Question:   Radiology Contrast Protocol - do NOT remove file path    Answer:   [email or web address]  . Urinalysis, Routine w reflex microscopic     Return  for Next available with CT results for possible cystoscopy. .    CC: [facility].      Irine Seal 05/31/2020 [phone]

## 2020-05-31 NOTE — Progress Notes (Signed)
Urological Symptom Review  Patient is experiencing the following symptoms: Blood in urine   Review of Systems  Gastrointestinal (upper)  : Negative for upper GI symptoms  Gastrointestinal (lower) : Negative for lower GI symptoms  Constitutional : Negative for symptoms  Skin: Negative for skin symptoms  Eyes: Negative for eye symptoms  Ear/Nose/Throat : Negative for Ear/Nose/Throat symptoms  Hematologic/Lymphatic: Negative for Hematologic/Lymphatic symptoms  Cardiovascular : Negative for cardiovascular symptoms  Respiratory : Shortness of breath  Endocrine: Negative for endocrine symptoms  Musculoskeletal: Negative for musculoskeletal symptoms  Neurological: Negative for neurological symptoms  Psychologic: Negative for psychiatric symptoms

## 2020-06-02 LAB — URINE CULTURE

## 2020-06-04 ENCOUNTER — Telehealth: Payer: Self-pay

## 2020-06-04 NOTE — Telephone Encounter (Signed)
Called patient. No answer. Results mailed.

## 2020-06-04 NOTE — Progress Notes (Signed)
Results mailed 

## 2020-06-04 NOTE — Telephone Encounter (Signed)
-----   Message from Irine Seal, MD sent at 06/04/2020 10:03 AM EST ----- Negative culture.

## 2020-06-20 ENCOUNTER — Ambulatory Visit: Payer: Medicare Other | Admitting: Cardiology

## 2020-06-20 ENCOUNTER — Encounter: Payer: Self-pay | Admitting: Cardiology

## 2020-06-20 ENCOUNTER — Encounter: Payer: Self-pay | Admitting: *Deleted

## 2020-06-20 VITALS — BP 130/80 | HR 62 | Ht 72.0 in | Wt 201.2 lb

## 2020-06-20 DIAGNOSIS — I2510 Atherosclerotic heart disease of native coronary artery without angina pectoris: Secondary | ICD-10-CM

## 2020-06-20 DIAGNOSIS — I4891 Unspecified atrial fibrillation: Secondary | ICD-10-CM

## 2020-06-20 DIAGNOSIS — I255 Ischemic cardiomyopathy: Secondary | ICD-10-CM

## 2020-06-20 NOTE — Progress Notes (Signed)
Clinical Summary Mr. Moroney is a 86 y.o.male  1. CAD/ICM -abnormal stress test 2018 intermediate risk managed medcally - admitted 06/2018 with epgiastric and chest pain, somewhat atypical chest pain symptoms -significant EKG changes, aVR ST elevation and diffuse ST depression suggesting LM or multivessel disease. Eventual severe troponin elevation peak 60 - management complicated by severe anemia, Hgb 5.5   - eventual cath 07/2018 showed ostial LAD 70%, OM2 100%, , OM3 100%, RCA 85%. Cultprit thought to be occluded OM2 and OM3. OM3 has colalterals from LAD. Managed medically due to GI bleed - 06/2018 echo LVEF 81-82%, grade I diastolic dysfunction, PASP 64. Aortic root 40 mm - LVEF in 03/2018 was 55-60%    - admitted again late 07/2018 with recurrent anemia and elevated troponin with chest pain -plavix and xarelto were held due to GI bleed at that time    - no recent chest pain - no SOB or DOE. No LE edema - compliant with meds   2. Atrial arrythmias history of atrial arrythmias including afib/aflutter/MAT - had been off anticoag due to prior GI bleed  - we did start eliquis at last visit - did well for 6 months, no recurrent GI bleeds but recent severe hematuria and had to stop eliquis.     3. Anemia/GI bleed -EGD 03/27 showed mild gastritis, duodenitis and mild esophagitis. -Colonoscopy showed few colon polyps which was removed with cold snare and biopsy forcep, medium sized lipomaat hepaticflexure, diverticulosis and large internal hemorrhoids.  - admitted again late 07/2018 with anemia. Hgb 5.2, received 4 unirt pRBCs. capsule endoscopy shows erythematous nonerosive gastritis and a strand of adherent blood in the proximal duodenum within the area examined at the last EGD. GI recommends strict avoidance of Alka-Seltzer and other ulcerogenic medications and increase PPI to twice daily indefinitely, add sucralfate for 2 weeks and continue as  recommended.  - no recent issues    3. COPD - abnoraml PFTs 12/2017, we referred to Dr Luan Pulling but he never scheduled appt - trying symbicort by pcp, ran out and did not obtain more  - has not wanted to see pulmonary   4.Hematuria - followed by urology - CT pending - had to stop eliquis, has not restarted.   5. Hyperlipidemia - 10/2019 TC 117 TG 84 HDL 40 LDL 60 - compliant with statin  Past Medical History:  Diagnosis Date  . Acute diastolic CHF (congestive heart failure) ([place]) 02/11/2017  . Anemia    CHRONIC   . Atrial flutter ([place])    a. diagnosed in 02/2017. Rate-control strategy pursued.   Marland Kitchen BPH (benign prostatic hyperplasia)   . Chronic diastolic heart failure ([place])   . Dyspnea   . Hypertension   . NSTEMI (non-ST elevated myocardial infarction) ([place])   . Renal disorder    cyst on kidney     Allergies  Allergen Reactions  . Atorvastatin     Severe leg pain/ache  . Penicillins     Has patient had a PCN reaction causing immediate rash, facial/tongue/throat swelling, SOB or lightheadedness with hypotension: Yes Has patient had a PCN reaction causing severe rash involving mucus membranes or skin necrosis: No Has patient had a PCN reaction that required hospitalization: No Has patient had a PCN reaction occurring within the last 10 years: No If all of the above answers are "NO", then may proceed with Cephalosporin use.     Current Outpatient Medications  Medication Sig Dispense Refill  . alum & mag hydroxide-simeth (MAALOX/MYLANTA)  200-200-20 MG/5ML suspension Take 30 mLs by mouth every 4 (four) hours as needed for indigestion or heartburn. 355 mL 0  . ELIQUIS 5 MG TABS tablet TAKE 1 TABLET BY MOUTH  TWICE DAILY 180 tablet 3  . finasteride (PROSCAR) 5 MG tablet Take 5 mg by mouth daily.    . furosemide (LASIX) 80 MG tablet Take 0.5-1 tablets (40-80 mg total) by mouth daily. Take 40 mg p.o. daily on Monday, Wednesday, Friday, and Sunday.  Take Lasix 80 mg  p.o. daily on Tuesday, Thursday, and Saturday 90 tablet 1  . isosorbide mononitrate (IMDUR) 30 MG 24 hr tablet Take 1 tablet (30 mg total) by mouth at bedtime. 90 tablet 3  . losartan (COZAAR) 25 MG tablet Take 1 tablet (25 mg total) by mouth daily. 30 tablet 0  . metoprolol succinate (TOPROL XL) 25 MG 24 hr tablet Take 1 tablet (25 mg total) by mouth daily. 90 tablet 1  . potassium chloride (K-DUR,KLOR-CON) 20 MEQ tablet Take 1 tablet (20 mEq total) by mouth daily. 30 tablet 0  . rosuvastatin (CRESTOR) 5 MG tablet Take 1 tablet (5 mg total) by mouth every other day. 45 tablet 1   No current facility-administered medications for this visit.     Past Surgical History:  Procedure Laterality Date  . ABCESS DRAINAGE  11/2008   BUTTOCKS  . BIOPSY  07/13/2018   Procedure: BIOPSY;  Surgeon: Otis Brace, MD;  [facility];  Service: Gastroenterology;;  . CATARACT EXTRACTION, BILATERAL    . COLONOSCOPY N/A 04/02/2017   Procedure: COLONOSCOPY;  Surgeon: Rogene Houston, MD;  [facility];  Service: Endoscopy;  Laterality: N/A;  10:55  . COLONOSCOPY WITH PROPOFOL N/A 07/13/2018   Procedure: COLONOSCOPY WITH PROPOFOL;  Surgeon: Otis Brace, MD;  Location: [place];  Service: Gastroenterology;  Laterality: N/A;  . ESOPHAGOGASTRODUODENOSCOPY (EGD) WITH PROPOFOL N/A 07/09/2018   Procedure: ESOPHAGOGASTRODUODENOSCOPY (EGD) WITH PROPOFOL;  Surgeon: Wilford Corner, MD;  Location: [place];  Service: Endoscopy;  Laterality: N/A;  . GIVENS CAPSULE STUDY N/A 08/06/2018   Procedure: GIVENS CAPSULE STUDY;  Surgeon: Ronald Lobo, MD;  Location: [place];  Service: Endoscopy;  Laterality: N/A;  . LEFT HEART CATH AND CORONARY ANGIOGRAPHY N/A 07/14/2018   Procedure: LEFT HEART CATH AND CORONARY ANGIOGRAPHY;  Surgeon: Belva Crome, MD;  Location: [place] [facility];  Service: Cardiovascular;  Laterality: N/A;  . POLYPECTOMY  04/02/2017   Procedure: POLYPECTOMY;   Surgeon: Rogene Houston, MD;  [facility];  Service: Endoscopy;;  asceding colon(hot snare)/ sigmoid colon times two (cold snare)  . POLYPECTOMY  07/13/2018   Procedure: POLYPECTOMY;  Surgeon: Otis Brace, MD;  [facility];  Service: Gastroenterology;;     Allergies  Allergen Reactions  . Atorvastatin     Severe leg pain/ache  . Penicillins     Has patient had a PCN reaction causing immediate rash, facial/tongue/throat swelling, SOB or lightheadedness with hypotension: Yes Has patient had a PCN reaction causing severe rash involving mucus membranes or skin necrosis: No Has patient had a PCN reaction that required hospitalization: No Has patient had a PCN reaction occurring within the last 10 years: No If all of the above answers are "NO", then may proceed with Cephalosporin use.      Family History  Problem Relation Age of Onset  . CVA Father   . CAD Father   . Diabetes Sister   . Colon cancer Neg Hx  Social History Mr. Groft reports that he quit smoking about 16 years ago. His smoking use included cigarettes. He has never used smokeless tobacco. Mr. Zufall reports no history of alcohol use.   Review of Systems CONSTITUTIONAL: No weight loss, fever, chills, weakness or fatigue.  HEENT: Eyes: No visual loss, blurred vision, double vision or yellow sclerae.No hearing loss, sneezing, congestion, runny nose or sore throat.  SKIN: No rash or itching.  CARDIOVASCULAR: per hpi RESPIRATORY: No shortness of breath, cough or sputum.  GASTROINTESTINAL: No anorexia, nausea, vomiting or diarrhea. No abdominal pain or blood.  GENITOURINARY: No burning on urination, no polyuria NEUROLOGICAL: No headache, dizziness, syncope, paralysis, ataxia, numbness or tingling in the extremities. No change in bowel or bladder control.  MUSCULOSKELETAL: No muscle, back pain, joint pain or stiffness.  LYMPHATICS: No enlarged nodes. No history of splenectomy.   PSYCHIATRIC: No history of depression or anxiety.  ENDOCRINOLOGIC: No reports of sweating, cold or heat intolerance. No polyuria or polydipsia.  Marland Kitchen   Physical Examination Today's Vitals   06/20/20 1028  BP: 130/80  Pulse: 62  SpO2: 98%  Weight: 201 lb 3.2 oz (91.3 kg)  Height: 6' (1.829 m)   Body mass index is 27.29 kg/m.  Gen: resting comfortably, no acute distress HEENT: no scleral icterus, pupils equal round and reactive, no palptable cervical adenopathy,  CV: RRR, no mr/g, no jvd Resp: Clear to auscultation bilaterally GI: abdomen is soft, non-tender, non-distended, normal bowel sounds, no hepatosplenomegaly MSK: extremities are warm, no edema.  Skin: warm, no rash Neuro:  no focal deficits Psych: appropriate affect   Diagnostic Studies 06/2018 echo IMPRESSIONS   1. Severe hypokinesis of the left ventricular, entire inferolateral wall. 2. The left ventricle has mild-moderately reduced systolic function, with an ejection fraction of 40-45%. The cavity size was normal. There is mild asymmetric left ventricular hypertrophy. Left ventricular diastolic Doppler parameters are consistent  with impaired relaxation. 3. The right ventricle has normal systolc function. The cavity was normal. There is no increase in right ventricular wall thickness. Right ventricular systolic pressure is moderately elevated with an estimated pressure of 63.7 mmHg. 4. Left atrial size was moderately dilated. 5. Right atrial size was moderately dilated. 6. The mitral valve is myxomatous. Mild thickening of the mitral valve leaflet. 7. The aortic valve is tricuspid Mild thickening of the aortic valve Mild calcification of the aortic valve. Aortic valve regurgitation is trivial by color flow Doppler. 8. There is mild dilatation of the aortic root measuring 40 mm. 9. The inferior vena cava was dilated in size with >50% respiratory variability. 10. No intracardiac thrombi or masses were  visualized.   07/2018 cath  Significant three-vessel coronary artery disease.  Segmental 70% proximal to mid LAD. This is an intermediate stenosis but would likely have positive FFR if tested.  Total occlusion of the second and third obtuse marginal branches, the sources of the patient's recent non-ST elevation myocardial infarction. The LAD supplies collaterals to the third obtuse marginal which is a large of the 3 obtuse marginals.  85% proximal RCA. Right dominant coronary anatomy.  No aortic valve gradient  Mildly elevated LVEDP  RECOMMENDATIONS:   Combination antiplatelet and antithrombotic therapy will place the patient at increased risk for recurrent bleeding. Requires combination therapy because of paroxysmal atrial flutter and significant underlying ischemic heart disease. Would not recommend either until hemoglobin is stable and above 9 for at least 2 weeks. Would avoid aspirin. Perhaps low-dose rivaroxaban 2.5 mg twice daily and Plavix  75 mg/day would be his best option (based on the [facility] trial). [facility] used aspirin, but this patient's endoscopy has suggested aspirin will be worse than Plavix as far as bleeding risk is concerned. Low-dose rivaroxaban and Plavix has never been started as a stroke preventative measure.No good data in this particular clinical subset.  Anti-ischemic therapy with beta-blocker and long-acting nitrates as tolerated by blood pressure.  Aggressive secondary risk factor modification including high intensity statin therapy.  Will need clinical cardiology and gastroenterology follow-up to coordinate care. Cardiology follow-up should be within the next 4 weeks.  Hemoglobin in 2 and 4 weeks.     Assessment and Plan  1. CAD/ICM -prior NSTEMI as reported above, management complicated by GI bleed and severe anemia. He had a cath as reported above but disease managed medically due to antiplatelet limitation from GI bleed  - no recent  symptoms, we will continue current meds   2. Afib - previously not on anticoag due to GI bleed - last visit we started eliquis, did well for 6 months but recently had severe hematuria and eliquis on hold - ongoing workup by urology  3. Hyperlipidemia - at goal, continue statin  Focused visit with PA in next few weeks to consider restarting eliquis pending urology workup.      Arnoldo Lenis, M.D.

## 2020-06-20 NOTE — Patient Instructions (Signed)
Your physician recommends that you schedule a follow-up appointment in: 2 MONTHS  Your physician recommends that you continue on your current medications as directed. Please refer to the Current Medication list given to you today.  Thank you for choosing [facility]!!

## 2020-07-30 ENCOUNTER — Other Ambulatory Visit: Payer: Self-pay

## 2020-07-30 DIAGNOSIS — R319 Hematuria, unspecified: Secondary | ICD-10-CM

## 2020-08-02 ENCOUNTER — Other Ambulatory Visit: Payer: Medicare Other | Admitting: Urology

## 2020-08-28 ENCOUNTER — Ambulatory Visit: Payer: Medicare Other | Admitting: Family Medicine

## 2020-09-03 ENCOUNTER — Ambulatory Visit: Payer: Medicare Other | Admitting: Family Medicine

## 2020-09-03 ENCOUNTER — Encounter: Payer: Self-pay | Admitting: Family Medicine

## 2020-09-03 VITALS — BP 124/64 | HR 52 | Ht 72.0 in | Wt 199.2 lb

## 2020-09-03 DIAGNOSIS — I255 Ischemic cardiomyopathy: Secondary | ICD-10-CM | POA: Diagnosis not present

## 2020-09-03 DIAGNOSIS — I2510 Atherosclerotic heart disease of native coronary artery without angina pectoris: Secondary | ICD-10-CM

## 2020-09-03 DIAGNOSIS — I10 Essential (primary) hypertension: Secondary | ICD-10-CM | POA: Diagnosis not present

## 2020-09-03 DIAGNOSIS — I4891 Unspecified atrial fibrillation: Secondary | ICD-10-CM

## 2020-09-03 NOTE — Progress Notes (Signed)
Cardiology Office Note  Date: 09/03/2020   ID: Michael Stephens, DOB 02/28/1935, MRN [id number]  PCP:  [facility]  Cardiologist:  Carlyle Dolly, MD Electrophysiologist:  None   Chief Complaint: Follow-up for possible restarting anticoagulant  History of Present Illness: Michael Stephens is a 86 y.o. male with a history of CAD, ischemic cardiomyopathy, atrial fibrillation.,  Hypertension, NSTEMI, dyspnea, anemia, diastolic heart failure.   Last seen by Dr. Harl Bowie on 06/20/2020.  His Eliquis had previously been stopped due to hematuria and was following neurology.  He has a pending cystoscopy and hematuria work-up at [facility] on June 12 and June 9 respectively.  Currently denies any hematuria.  He is compliant with his statin medication.  Last LDL July 2021 was 60.  His COPD is stable.  Denies DOE or SOB.  He denies any anginal or exertional symptoms, palpitations or arrhythmias, orthostatic symptoms, CVA or TIA-like symptoms.  He is here today for follow-up and denies any further hematuria since stopping Eliquis.  He has a pending follow-up with urology and upcoming cystoscopy.  He will have a CT hematuria work-up on 09/13/2020.   Past Medical History:  Diagnosis Date  . Acute diastolic CHF (congestive heart failure) ([place]) 02/11/2017  . Anemia    CHRONIC   . Atrial flutter ([place])    a. diagnosed in 02/2017. Rate-control strategy pursued.   Marland Kitchen BPH (benign prostatic hyperplasia)   . Chronic diastolic heart failure ([place])   . Dyspnea   . Hypertension   . NSTEMI (non-ST elevated myocardial infarction) ([place])   . Renal disorder    cyst on kidney    Past Surgical History:  Procedure Laterality Date  . ABCESS DRAINAGE  11/2008   BUTTOCKS  . BIOPSY  07/13/2018   Procedure: BIOPSY;  Surgeon: Otis Brace, MD;  [facility];  Service: Gastroenterology;;  . CATARACT EXTRACTION, BILATERAL    . COLONOSCOPY N/A 04/02/2017   Procedure: COLONOSCOPY;   Surgeon: Rogene Houston, MD;  [facility];  Service: Endoscopy;  Laterality: N/A;  10:55  . COLONOSCOPY WITH PROPOFOL N/A 07/13/2018   Procedure: COLONOSCOPY WITH PROPOFOL;  Surgeon: Otis Brace, MD;  Location: [place];  Service: Gastroenterology;  Laterality: N/A;  . ESOPHAGOGASTRODUODENOSCOPY (EGD) WITH PROPOFOL N/A 07/09/2018   Procedure: ESOPHAGOGASTRODUODENOSCOPY (EGD) WITH PROPOFOL;  Surgeon: Wilford Corner, MD;  Location: [place];  Service: Endoscopy;  Laterality: N/A;  . GIVENS CAPSULE STUDY N/A 08/06/2018   Procedure: GIVENS CAPSULE STUDY;  Surgeon: Ronald Lobo, MD;  Location: [place];  Service: Endoscopy;  Laterality: N/A;  . LEFT HEART CATH AND CORONARY ANGIOGRAPHY N/A 07/14/2018   Procedure: LEFT HEART CATH AND CORONARY ANGIOGRAPHY;  Surgeon: Belva Crome, MD;  Location: [place] CV LAB;  Service: Cardiovascular;  Laterality: N/A;  . POLYPECTOMY  04/02/2017   Procedure: POLYPECTOMY;  Surgeon: Rogene Houston, MD;  [facility];  Service: Endoscopy;;  asceding colon(hot snare)/ sigmoid colon times two (cold snare)  . POLYPECTOMY  07/13/2018   Procedure: POLYPECTOMY;  Surgeon: Otis Brace, MD;  [facility];  Service: Gastroenterology;;    Current Outpatient Medications  Medication Sig Dispense Refill  . alum & mag hydroxide-simeth (MAALOX/MYLANTA) 200-200-20 MG/5ML suspension Take 30 mLs by mouth every 4 (four) hours as needed for indigestion or heartburn. 355 mL 0  . finasteride (PROSCAR) 5 MG tablet Take 5 mg by mouth daily.    . furosemide (LASIX) 80 MG tablet Take 0.5-1 tablets (40-80 mg total)  by mouth daily. Take 40 mg p.o. daily on Monday, Wednesday, Friday, and Sunday.  Take Lasix 80 mg p.o. daily on Tuesday, Thursday, and Saturday 90 tablet 1  . isosorbide mononitrate (IMDUR) 30 MG 24 hr tablet Take 1 tablet (30 mg total) by mouth at bedtime. 90 tablet 3  . losartan (COZAAR) 25 MG tablet Take 1 tablet (25 mg  total) by mouth daily. 30 tablet 0  . metoprolol succinate (TOPROL XL) 25 MG 24 hr tablet Take 1 tablet (25 mg total) by mouth daily. 90 tablet 1  . potassium chloride (K-DUR,KLOR-CON) 20 MEQ tablet Take 1 tablet (20 mEq total) by mouth daily. 30 tablet 0  . rosuvastatin (CRESTOR) 5 MG tablet Take 1 tablet (5 mg total) by mouth every other day. 45 tablet 1  . ELIQUIS 5 MG TABS tablet TAKE 1 TABLET BY MOUTH  TWICE DAILY (Patient not taking: Reported on 09/03/2020) 180 tablet 3   No current facility-administered medications for this visit.   Allergies:  Atorvastatin and Penicillins   Social History: The patient  reports that he quit smoking about 16 years ago. His smoking use included cigarettes. He has never used smokeless tobacco. He reports that he does not drink alcohol and does not use drugs.   Family History: The patient's family history includes CAD in his father; CVA in his father; Diabetes in his sister.   ROS:  Please see the history of present illness. Otherwise, complete review of systems is positive for none.  All other systems are reviewed and negative.   Physical Exam: VS:  BP 124/64   Pulse (!) 52   Ht 6' (1.829 m)   Wt 199 lb 3.2 oz (90.4 kg)   SpO2 98%   BMI 27.02 kg/m , BMI Body mass index is 27.02 kg/m.  Wt Readings from Last 3 Encounters:  09/03/20 199 lb 3.2 oz (90.4 kg)  06/20/20 201 lb 3.2 oz (91.3 kg)  05/31/20 200 lb (90.7 kg)    General: Patient appears comfortable at rest. Neck: Supple, no elevated JVP or carotid bruits, no thyromegaly. Lungs: Clear to auscultation, nonlabored breathing at rest. Cardiac: Regular rate and rhythm, no S3 or significant systolic murmur, no pericardial rub. Extremities: No pitting edema, distal pulses 2+. Skin: Warm and dry. Musculoskeletal: No kyphosis. Neuropsychiatric: Alert and oriented x3, affect grossly appropriate.  ECG:  December 20, 2019 sinus bradycardia rate of 52, RBBB, possible lateral infarct, possible  inferior infarct, age undetermined  Recent Labwork: No results found for requested labs within last 8760 hours.     Component Value Date/Time   CHOL 135 07/10/2018 0339   TRIG 47 07/10/2018 0339   HDL 41 07/10/2018 0339   CHOLHDL 3.3 07/10/2018 0339   VLDL 9 07/10/2018 0339   LDLCALC 85 07/10/2018 0339    Other Studies Reviewed Today:  Diagnostic Studies 06/2018 echo IMPRESSIONS 1. Severe hypokinesis of the left ventricular, entire inferolateral wall. 2. The left ventricle has mild-moderately reduced systolic function, with an ejection fraction of 40-45%. The cavity size was normal. There is mild asymmetric left ventricular hypertrophy. Left ventricular diastolic Doppler parameters are consistent  with impaired relaxation. 3. The right ventricle has normal systolc function. The cavity was normal. There is no increase in right ventricular wall thickness. Right ventricular systolic pressure is moderately elevated with an estimated pressure of 63.7 mmHg. 4. Left atrial size was moderately dilated. 5. Right atrial size was moderately dilated. 6. The mitral valve is myxomatous. Mild thickening of the mitral  valve leaflet. 7. The aortic valve is tricuspid Mild thickening of the aortic valve Mild calcification of the aortic valve. Aortic valve regurgitation is trivial by color flow Doppler. 8. There is mild dilatation of the aortic root measuring 40 mm. 9. The inferior vena cava was dilated in size with >50% respiratory variability. 10. No intracardiac thrombi or masses were visualized.   07/2018 cath  Significant three-vessel coronary artery disease.  Segmental 70% proximal to mid LAD. This is an intermediate stenosis but would likely have positive FFR if tested.  Total occlusion of the second and third obtuse marginal branches, the sources of the patient's recent non-ST elevation myocardial infarction. The LAD supplies collaterals to the third obtuse marginal which is a  large of the 3 obtuse marginals.  85% proximal RCA. Right dominant coronary anatomy.  No aortic valve gradient  Mildly elevated LVEDP  RECOMMENDATIONS:   Combination antiplatelet and antithrombotic therapy will place the patient at increased risk for recurrent bleeding. Requires combination therapy because of paroxysmal atrial flutter and significant underlying ischemic heart disease. Would not recommend either until hemoglobin is stable and above 9 for at least 2 weeks. Would avoid aspirin. Perhaps low-dose rivaroxaban 2.5 mg twice daily and Plavix 75 mg/day would be his best option (based on the [facility] trial). [facility] used aspirin, but this patient's endoscopy has suggested aspirin will be worse than Plavix as far as bleeding risk is concerned. Low-dose rivaroxaban and Plavix has never been started as a stroke preventative measure.No good data in this particular clinical subset.  Anti-ischemic therapy with beta-blocker and long-acting nitrates as tolerated by blood pressure.  Aggressive secondary risk factor modification including high intensity statin therapy.  Will need clinical cardiology and gastroenterology follow-up to coordinate care. Cardiology follow-up should be within the next 4 weeks.  Hemoglobin in 2 and 4 weeks.   Assessment and Plan:  1. CAD in native artery   2. Ischemic cardiomyopathy   3. Atrial fibrillation, unspecified type ([place])   4. Essential hypertension    1. CAD in native artery Denies any anginal or exertional symptoms.  History of prior NSTEMI management complicated by GI bleed and severe anemia.  Currently not on antiplatelets or DOAC's.  Continue Imdur 30 mg daily.  Continue Crestor 5 mg daily.  2. Ischemic cardiomyopathy Last echocardiogram showed severe hypokinesis of LV entire inferolateral wall.  EF 40 to 45%.  Mild asymmetric LVH.  Left ventricular end-diastolic parameters consistent with impaired relaxation.  RV systolic pressure  moderately elevated with estimated pressure of 63.7 mmHg.  LA size moderately dilated, RA size moderately dilated, mitral valve myxomatous.  Mild dilatation of the aortic root measuring 40 mm.  Continue furosemide 40 to 80 mg daily by mouth.  Take 40 mg p.o. daily on Monday, Wednesday, Friday and Sunday.  Take Lasix 80 on Tuesday, Thursday, Saturday.  3. Atrial fibrillation, unspecified type ([place]) Rate controlled today at 52.  Continue Toprol-XL 25 mg daily.  Continuing to hold Eliquis for now until cystoscopy and follow-up with urology.  Currently denies any hematuria.  4.  Hypertension BP today 124/64.  Continue losartan 25 mg p.o. daily.  Medication Adjustments/Labs and Tests Ordered: Current medicines are reviewed at length with the patient today.  Concerns regarding medicines are outlined above.   Disposition: Follow-up with Dr. Harl Bowie or APP  Signed, Levell July, NP 09/03/2020 1:42 PM    [facility] [place], [place], [place] [address] Phone: [phone]; Fax: [phone]

## 2020-09-03 NOTE — Patient Instructions (Addendum)
Medication Instructions:  Your physician recommends that you continue on your current medications as directed. Please refer to the Current Medication list given to you today.  Labwork: none  Testing/Procedures: none  Follow-Up: Your physician recommends that you schedule a follow-up appointment in: 6 weeks  Any Other Special Instructions Will Be Listed Below (If Applicable).  If you need a refill on your cardiac medications before your next appointment, please call your pharmacy. 

## 2020-09-13 ENCOUNTER — Ambulatory Visit (HOSPITAL_COMMUNITY)
Admission: RE | Admit: 2020-09-13 | Discharge: 2020-09-13 | Disposition: A | Discharge status: Home / Self Care | Payer: Medicare Other | Source: Ambulatory Visit | Attending: Urology | Admitting: Urology

## 2020-09-13 DIAGNOSIS — R319 Hematuria, unspecified: Secondary | ICD-10-CM | POA: Insufficient documentation

## 2020-09-13 LAB — POCT I-STAT CREATININE: Creatinine, Ser: 1 mg/dL (ref 0.61–1.24)

## 2020-09-13 MED ORDER — IOHEXOL 300 MG/ML  SOLN
25.0000 mL | Freq: Once | INTRAMUSCULAR | Status: AC | PRN
  Administered 2020-09-13: 25 mL via INTRAVENOUS

## 2020-09-13 MED ORDER — IOHEXOL 300 MG/ML  SOLN: 125.0000 mL | Freq: Once | INTRAMUSCULAR | Status: DC | PRN

## 2020-09-13 MED ORDER — IOHEXOL 300 MG/ML  SOLN
100.0000 mL | Freq: Once | INTRAMUSCULAR | Status: AC | PRN
  Administered 2020-09-13: 100 mL via INTRAVENOUS

## 2020-09-20 ENCOUNTER — Encounter: Payer: Self-pay | Admitting: Urology

## 2020-09-20 ENCOUNTER — Ambulatory Visit (INDEPENDENT_AMBULATORY_CARE_PROVIDER_SITE_OTHER): Payer: Medicare Other | Admitting: Urology

## 2020-09-20 ENCOUNTER — Other Ambulatory Visit: Payer: Self-pay

## 2020-09-20 VITALS — BP 159/82 | HR 56 | Ht 72.0 in | Wt 195.0 lb

## 2020-09-20 DIAGNOSIS — R319 Hematuria, unspecified: Secondary | ICD-10-CM

## 2020-09-20 DIAGNOSIS — R339 Retention of urine, unspecified: Secondary | ICD-10-CM

## 2020-09-20 DIAGNOSIS — N138 Other obstructive and reflux uropathy: Secondary | ICD-10-CM

## 2020-09-20 DIAGNOSIS — N401 Enlarged prostate with lower urinary tract symptoms: Secondary | ICD-10-CM

## 2020-09-20 DIAGNOSIS — N1330 Unspecified hydronephrosis: Secondary | ICD-10-CM

## 2020-09-20 LAB — URINALYSIS, ROUTINE W REFLEX MICROSCOPIC
Bilirubin, UA: NEGATIVE
Glucose, UA: NEGATIVE
Ketones, UA: NEGATIVE
Leukocytes,UA: NEGATIVE
Nitrite, UA: NEGATIVE
Protein,UA: NEGATIVE
Specific Gravity, UA: 1.02 (ref 1.005–1.030)
Urobilinogen, Ur: 0.2 mg/dL (ref 0.2–1.0)
pH, UA: 6.5 (ref 5.0–7.5)

## 2020-09-20 LAB — MICROSCOPIC EXAMINATION
Bacteria, UA: NONE SEEN
Epithelial Cells (non renal): NONE SEEN /hpf (ref 0–10)
Renal Epithel, UA: NONE SEEN /hpf
WBC, UA: NONE SEEN /hpf (ref 0–5)

## 2020-09-20 MED ORDER — TAMSULOSIN HCL 0.4 MG PO CAPS: 0.4000 mg | ORAL_CAPSULE | Freq: Every day | ORAL | 11 refills | Status: DC

## 2020-09-20 MED ORDER — CIPROFLOXACIN HCL 500 MG PO TABS
500.0000 mg | ORAL_TABLET | Freq: Once | ORAL | Status: AC
  Administered 2020-09-20: 500 mg via ORAL

## 2020-09-20 NOTE — Progress Notes (Signed)
Subjective: 1. Hematuria, unspecified type   2. BPH with urinary obstruction   3. Incomplete bladder emptying   4. Hydronephrosis, unspecified hydronephrosis type     09/20/20: Michael Stephens returns today in f/u for cystoscopy to complete the hematuria w/u.  He had a CT on 09/13/20 that showed bilateral hydroureteronephrosis to the bladder that wasn't present on a CT in 2020.  There is bladder wall thickening that was present previously.   The prostate looks slightly different with some lower density tissue at the bladder neck and with the obstruction, he could have a prostate neoplasm.   He has not had a PSA that he is aware of.   His Cr was 1.0 on 09/13/20.  He remains on finasteride but continues to hold the eliquis.   05/31/20: Michael Stephens is an 86 yo male who presents with a recent history of gross hematuria about 2 weeks ago.  He had 2-3 bloody voids and then he took and OTC kidney pill and it stopped.   He was having fairly severe RLQ pain that has resolved.  He had no nausea or vomiting.  He is voiding well.  He has a history of a renal cyst but no stones.  His UA today has 6-10 WBC, 11-30 RBC and a few bacteria.  He had a CT [facility] in 3/20 that showed right renal cysts, but no renal or ureteral stones, some prostate stones and an irregular bladder wall with some small diverticuli.   He is on finasteride for BPH.     ROS:  ROS  Allergies  Allergen Reactions   Atorvastatin     Severe leg pain/ache   Penicillins     Has patient had a PCN reaction causing immediate rash, facial/tongue/throat swelling, SOB or lightheadedness with hypotension: Yes Has patient had a PCN reaction causing severe rash involving mucus membranes or skin necrosis: No Has patient had a PCN reaction that required hospitalization: No Has patient had a PCN reaction occurring within the last 10 years: No If all of the above answers are "NO", then may proceed with Cephalosporin use.    Past Medical History:  Diagnosis Date    Acute diastolic CHF (congestive heart failure) ([employer]) 02/11/2017   Anemia    CHRONIC    Atrial flutter ([place])    a. diagnosed in 02/2017. Rate-control strategy pursued.    BPH (benign prostatic hyperplasia)    Chronic diastolic heart failure ([facility])    Dyspnea    Hypertension    NSTEMI (non-ST elevated myocardial infarction) [facility])    Renal disorder    cyst on kidney    Past Surgical History:  Procedure Laterality Date   ABCESS DRAINAGE  11/2008   BUTTOCKS   BIOPSY  07/13/2018   Procedure: BIOPSY;  Surgeon: Otis Brace, MD;  Location: [place];  Service: Gastroenterology;;   CATARACT EXTRACTION, BILATERAL     COLONOSCOPY N/A 04/02/2017   Procedure: COLONOSCOPY;  Surgeon: Rogene Houston, MD;  [facility];  Service: Endoscopy;  Laterality: N/A;  10:55   COLONOSCOPY WITH PROPOFOL N/A 07/13/2018   Procedure: COLONOSCOPY WITH PROPOFOL;  Surgeon: Otis Brace, MD;  Location: [place];  Service: Gastroenterology;  Laterality: N/A;   ESOPHAGOGASTRODUODENOSCOPY (EGD) WITH PROPOFOL N/A 07/09/2018   Procedure: ESOPHAGOGASTRODUODENOSCOPY (EGD) WITH PROPOFOL;  Surgeon: Wilford Corner, MD;  Location: [place];  Service: Endoscopy;  Laterality: N/A;   GIVENS CAPSULE STUDY N/A 08/06/2018   Procedure: GIVENS CAPSULE STUDY;  Surgeon: Ronald Lobo, MD;  Location: [facility]  ENDOSCOPY;  Service: Endoscopy;  Laterality: N/A;   LEFT HEART CATH AND CORONARY ANGIOGRAPHY N/A 07/14/2018   Procedure: LEFT HEART CATH AND CORONARY ANGIOGRAPHY;  Surgeon: Belva Crome, MD;  Location: [place] CV LAB;  Service: Cardiovascular;  Laterality: N/A;   POLYPECTOMY  04/02/2017   Procedure: POLYPECTOMY;  Surgeon: Rogene Houston, MD;  [facility];  Service: Endoscopy;;  asceding colon(hot snare)/ sigmoid colon times two (cold snare)   POLYPECTOMY  07/13/2018   Procedure: POLYPECTOMY;  Surgeon: Otis Brace, MD;  [facility];  Service: Gastroenterology;;    Social  History   Socioeconomic History   Marital status: Married    Spouse name: Not on file   Number of children: Not on file   Years of education: Not on file   Highest education level: Not on file  Occupational History   Not on file  Tobacco Use   Smoking status: Former    Pack years: 0.00    Types: Cigarettes    Quit date: 2006    Years since quitting: 16.4   Smokeless tobacco: Never  Vaping Use   Vaping Use: Never used  Substance and Sexual Activity   Alcohol use: No   Drug use: No   Sexual activity: Not Currently  Other Topics Concern   Not on file  Social History Narrative   Not on file   Social Determinants of Health   Financial Resource Strain: Not on file  Food Insecurity: Not on file  Transportation Needs: Not on file  Physical Activity: Not on file  Stress: Not on file  Social Connections: Not on file  Intimate Partner Violence: Not on file    Family History  Problem Relation Age of Onset   CVA Father    CAD Father    Diabetes Sister    Colon cancer Neg Hx     Anti-infectives: Anti-infectives (From admission, onward)    Start     Dose/Rate Route Frequency Ordered Stop   09/20/20 1130  CIPROFLOXACIN HCL 500 MG PO TABS        500 mg Oral  Once 09/20/20 1129 09/20/20 1156       Current Outpatient Medications  Medication Sig Dispense Refill   alum & mag hydroxide-simeth (MAALOX/MYLANTA) 200-200-20 MG/5ML suspension Take 30 mLs by mouth every 4 (four) hours as needed for indigestion or heartburn. 355 mL 0   ELIQUIS 5 MG TABS tablet TAKE 1 TABLET BY MOUTH  TWICE DAILY 180 tablet 3   finasteride (PROSCAR) 5 MG tablet Take 5 mg by mouth daily.     furosemide (LASIX) 80 MG tablet Take 0.5-1 tablets (40-80 mg total) by mouth daily. Take 40 mg p.o. daily on Monday, Wednesday, Friday, and Sunday.  Take Lasix 80 mg p.o. daily on Tuesday, Thursday, and Saturday 90 tablet 1   isosorbide mononitrate (IMDUR) 30 MG 24 hr tablet Take 1 tablet (30 mg total) by mouth at  bedtime. 90 tablet 3   losartan (COZAAR) 25 MG tablet Take 1 tablet (25 mg total) by mouth daily. 30 tablet 0   metoprolol succinate (TOPROL XL) 25 MG 24 hr tablet Take 1 tablet (25 mg total) by mouth daily. 90 tablet 1   potassium chloride (K-DUR,KLOR-CON) 20 MEQ tablet Take 1 tablet (20 mEq total) by mouth daily. 30 tablet 0   rosuvastatin (CRESTOR) 5 MG tablet Take 1 tablet (5 mg total) by mouth every other day. 45 tablet 1   tamsulosin (FLOMAX) 0.4 MG CAPS  capsule Take 1 capsule (0.4 mg total) by mouth daily. 30 capsule 11   No current facility-administered medications for this visit.     Objective: Vital signs in last 24 hours: BP (!) 159/82   Pulse (!) 56   Ht 6' (1.829 m)   Wt 195 lb (88.5 kg)   BMI 26.45 kg/m   Intake/Output from previous day: No intake/output data recorded. Intake/Output this shift: @IOTHISSHIFT @   Physical Exam  Lab Results:  UA reviewed.  0-2 RBC's.  Results for orders placed or performed in visit on 09/20/20 (from the past 24 hour(s))  Urinalysis, Routine w reflex microscopic     Status: Abnormal   Collection Time: 09/20/20 11:20 AM  Result Value Ref Range   Specific Gravity, UA 1.020 1.005 - 1.030   pH, UA 6.5 5.0 - 7.5   Color, UA Yellow Yellow   Appearance Ur Clear Clear   Leukocytes,UA Negative Negative   Protein,UA Negative Negative/Trace   Glucose, UA Negative Negative   Ketones, UA Negative Negative   RBC, UA Trace (A) Negative   Bilirubin, UA Negative Negative   Urobilinogen, Ur 0.2 0.2 - 1.0 mg/dL   Nitrite, UA Negative Negative   Microscopic Examination See below:    Narrative   Performed at:  [facility] [address] [place], [place]  [id number] Lab Director: Mina Marble MT, Phone:  [phone]  Microscopic Examination     Status: None   Collection Time: 09/20/20 11:20 AM   Urine  Result Value Ref Range   WBC, UA None seen 0 - 5 /hpf   RBC 0-2 0 - 2 /hpf   Epithelial Cells (non renal) None seen 0 - 10  /hpf   Renal Epithel, UA None seen None seen /hpf   Bacteria, UA None seen None seen/Few   Narrative   Performed at:  [facility] [address], [place], [place]  [id number] Lab Director: [place], Phone:  [phone]    Recent Results (from the past 2160 hour(s))  I-STAT creatinine     Status: None   Collection Time: 09/13/20 12:02 PM  Result Value Ref Range   Creatinine, Ser 1.00 0.61 - 1.24 mg/dL  Urinalysis, Routine w reflex microscopic     Status: Abnormal   Collection Time: 09/20/20 11:20 AM  Result Value Ref Range   Specific Gravity, UA 1.020 1.005 - 1.030   pH, UA 6.5 5.0 - 7.5   Color, UA Yellow Yellow   Appearance Ur Clear Clear   Leukocytes,UA Negative Negative   Protein,UA Negative Negative/Trace   Glucose, UA Negative Negative   Ketones, UA Negative Negative   RBC, UA Trace (A) Negative   Bilirubin, UA Negative Negative   Urobilinogen, Ur 0.2 0.2 - 1.0 mg/dL   Nitrite, UA Negative Negative   Microscopic Examination See below:   Microscopic Examination     Status: None   Collection Time: 09/20/20 11:20 AM   Urine  Result Value Ref Range   WBC, UA None seen 0 - 5 /hpf   RBC 0-2 0 - 2 /hpf   Epithelial Cells (non renal) None seen 0 - 10 /hpf   Renal Epithel, UA None seen None seen /hpf   Bacteria, UA None seen None seen/Few    Studies/Results: CT films and report from 09/13/20 and 07/01/20 reviewed.   Procedure: Flexible cystoscopy.  He was prepped with betadine, draped and the urethra was instilled with 2% lidocaine jelly.   The  flexible scope was passed and the urethra was normal.  The prostate was 2-3cm with bilobar hyperplasia with obstruction.  The bladder had a large PVR and was noted to have severe trabeculation with multiple cellules and small diverticuli.  The left UO was noted to be wide mouthed and patent.  The right UO was note clearly seen.  There was some patchy erythema of the bladder mucosa that could be inflammatory or CIS.    After the scope was removed, he was sent for a flowrate and PVR.   Flowrate reviewed.  PF 58ml with 10ml voided.  PVR >540ml.   Assessment/Plan: Gross hematuria. Culture was negative in 2/22 at his intial visit.  He has some patchy erythema of the bladder wall that could be inflammatory or CIS.  I will get a cytology today.  He is not interested in a surgical procedure at this time.  If the cytology is negative, I will just re-cystoscope at f/u.    Bilateral hydronephrosis that appears to be secondary to chronic bladder outlet obstruction with probable reflux vs bladder wall thickening.  There were no findings to suggest malignant obstruction but I will get a  PSA today because of the firm prostate and CT findings.  I discussed TURP and Urolift to relieve the outlet obstruction but he wants to try additional medications.  Since his Cr is normal, I think it is reasonable to try him on tamsulosin and recheck his PVR and a renal US in 3 months.   Side effects of the med reviewed. .    Meds ordered this encounter  Medications   ciprofloxacin (CIPRO) tablet 500 mg   tamsulosin (FLOMAX) 0.4 MG CAPS capsule    Sig: Take 1 capsule (0.4 mg total) by mouth daily.    Dispense:  30 capsule    Refill:  11     Orders Placed This Encounter  Procedures   Microscopic Examination   Ultrasound renal complete    Standing Status:   Future    Standing Expiration Date:   09/20/2021    Order Specific Question:   Reason for Exam (SYMPTOM  OR DIAGNOSIS REQUIRED)    Answer:   hydronephrosis f/u.    Order Specific Question:   Preferred imaging location?    Answer:   [facility]   Urinalysis, Routine w reflex microscopic   PSA   Basic metabolic panel    Standing Status:   Future    Standing Expiration Date:   03/22/2021     Return in about 3 months (around 12/21/2020) for with renal US and BMP. .    CC: [facility].      Irine Seal 09/20/2020 [phone]

## 2020-09-20 NOTE — Progress Notes (Signed)

## 2020-09-20 NOTE — Addendum Note (Signed)
Addended byIris Pert on: 09/20/2020 02:49 PM   Modules accepted: Orders

## 2020-09-21 LAB — CYTOLOGY - NON PAP

## 2020-09-21 LAB — PSA: Prostate Specific Ag, Serum: 0.1 ng/mL (ref 0.0–4.0)

## 2020-09-21 NOTE — Progress Notes (Signed)
Sent via mail 

## 2020-09-25 ENCOUNTER — Telehealth: Payer: Self-pay

## 2020-09-25 NOTE — Telephone Encounter (Signed)
-----   Message from Irine Seal, MD sent at 09/24/2020  5:15 PM EDT ----- I called and gave him his results.   I need to get him back in within the next 2-4 weeks to discuss the findings.   He is reluctant to proceed with a biopsy.  ----- Message ----- From: Iris Pert, LPN Sent: 05/15/69   8:26 AM EDT To: Irine Seal, MD  Please review

## 2020-09-25 NOTE — Progress Notes (Signed)
Letter mailed

## 2020-09-25 NOTE — Telephone Encounter (Signed)
Appointment made, letter sent.

## 2020-09-25 NOTE — Telephone Encounter (Signed)
Letter sent.

## 2020-10-15 NOTE — Progress Notes (Signed)
Cardiology Office Note  Date: 10/16/2020   ID: Michael Stephens, DOB 1935-01-06, MRN [id number]  PCP:  [facility], [facility]  Cardiologist:  Michael Dolly, MD Electrophysiologist:  None   Chief Complaint: Follow-up for possible restarting anticoagulant  History of Present Illness: Michael Stephens is a 86 y.o. male with a history of CAD, ischemic cardiomyopathy, atrial fibrillation.,  Hypertension, NSTEMI, dyspnea, anemia, diastolic heart failure.   Last seen by [facility] Stephens on 06/20/2020.  His Eliquis had previously been stopped due to hematuria and was following neurology.  He has a pending cystoscopy and hematuria work-up at [facility] on June 12 and June 9 respectively.  Currently denies any hematuria.  He is compliant with his statin medication.  Last LDL July 2021 was 60.  His COPD is stable.  Denies DOE or SOB.  He denies any anginal or exertional symptoms, palpitations or arrhythmias, orthostatic symptoms, CVA or TIA-like symptoms.  He was last here for follow-up and denied any further hematuria since stopping Eliquis.  He has a pending follow-up with urology and upcoming cystoscopy.  He will have a CT hematuria work-up on 09/13/2020.  Patient had a cystoscopy on 09/20/2020 for hematuria work-up.  He had a CT on 09/13/2020 demonstrating bilateral hydronephrosis  which was not present on a previous CT in 2020.  This was thought to be secondary to chronic bladder outlet obstruction with probable reflux versus bladder wall thickening.  There were no findings to suggest malignant obstruction.  UroLift and TURP were discussed.  Patient however wanted to try additional medications since his creatinine was normal it was thought reasonable to try him on tamsulosin check PVR and renal ultrasound in 3 months.  He is here for follow-up today.  He is still undergoing treatment with [facility] Stephens for hematuria.  He has an upcoming cystoscopy and possible biopsy with [facility].  He denies any further  bleeding since stopping the Eliquis.  Denies any anginal or exertional symptoms, orthostatic symptoms, CVA or TIA-like symptoms, PND, orthopnea, DOE or SOB.  Denies any claudication-like symptoms, DVT or PE-like symptoms, or lower extremity edema.  Spoke with Michael Stephens via secure chat today He states patient atypical cytology that is suspicious for malignancy.  He was supposed to be getting to see Michael Stephens to discuss a biopsy which he stated he was not sure he he wanted.  Michael Stephens mentioned he would be at increased bleeding risk if Eliquis was restarted at this point.  Past Medical History:  Diagnosis Date   Acute diastolic CHF (congestive heart failure) ([place]) 02/11/2017   Anemia    CHRONIC    Atrial flutter ([place])    a. diagnosed in 02/2017. Rate-control strategy pursued.    BPH (benign prostatic hyperplasia)    Chronic diastolic heart failure (HCC)    Dyspnea    Hypertension    NSTEMI (non-Michael elevated myocardial infarction) [facility])    Renal disorder    cyst on kidney    Past Surgical History:  Procedure Laterality Date   ABCESS DRAINAGE  11/2008   BUTTOCKS   BIOPSY  07/13/2018   Procedure: BIOPSY;  Surgeon: Michael Brace, MD;  Location: [place];  Service: Gastroenterology;;   CATARACT EXTRACTION, BILATERAL     COLONOSCOPY N/A 04/02/2017   Procedure: COLONOSCOPY;  Surgeon: Michael Houston, MD;  [facility];  Service: Endoscopy;  Laterality: N/A;  10:55   COLONOSCOPY WITH PROPOFOL N/A 07/13/2018   Procedure: COLONOSCOPY WITH PROPOFOL;  Surgeon: Michael Brace,  MD;  Location: [place];  Service: Gastroenterology;  Laterality: N/A;   ESOPHAGOGASTRODUODENOSCOPY (EGD) WITH PROPOFOL N/A 07/09/2018   Procedure: ESOPHAGOGASTRODUODENOSCOPY (EGD) WITH PROPOFOL;  Surgeon: Michael Corner, MD;  Location: [place];  Service: Endoscopy;  Laterality: N/A;   GIVENS CAPSULE STUDY N/A 08/06/2018   Procedure: GIVENS CAPSULE STUDY;  Surgeon: Michael Lobo, MD;  Location:  [place];  Service: Endoscopy;  Laterality: N/A;   LEFT HEART CATH AND CORONARY ANGIOGRAPHY N/A 07/14/2018   Procedure: LEFT HEART CATH AND CORONARY ANGIOGRAPHY;  Surgeon: Michael Crome, MD;  Location: [place] [facility];  Service: Cardiovascular;  Laterality: N/A;   POLYPECTOMY  04/02/2017   Procedure: POLYPECTOMY;  Surgeon: Michael Houston, MD;  [facility];  Service: Endoscopy;;  asceding colon(hot snare)/ sigmoid colon times two (cold snare)   POLYPECTOMY  07/13/2018   Procedure: POLYPECTOMY;  Surgeon: Michael Brace, MD;  Location: [place] [facility];  Service: Gastroenterology;;    Current Outpatient Medications  Medication Sig Dispense Refill   alum & mag hydroxide-simeth (MAALOX/MYLANTA) 200-200-20 MG/5ML suspension Take 30 mLs by mouth every 4 (four) hours as needed for indigestion or heartburn. 355 mL 0   ELIQUIS 5 MG TABS tablet TAKE 1 TABLET BY MOUTH  TWICE DAILY (Patient taking differently: PLEASE HOLD) 180 tablet 3   finasteride (PROSCAR) 5 MG tablet Take 5 mg by mouth daily.     furosemide (LASIX) 80 MG tablet Take 0.5-1 tablets (40-80 mg total) by mouth daily. Take 40 mg p.o. daily on Monday, Wednesday, Friday, and Sunday.  Take Lasix 80 mg p.o. daily on Tuesday, Thursday, and Saturday 90 tablet 1   isosorbide mononitrate (IMDUR) 30 MG 24 hr tablet Take 1 tablet (30 mg total) by mouth at bedtime. 90 tablet 3   losartan (COZAAR) 25 MG tablet Take 1 tablet (25 mg total) by mouth daily. 30 tablet 0   metoprolol succinate (TOPROL XL) 25 MG 24 hr tablet Take 1 tablet (25 mg total) by mouth daily. 90 tablet 1   potassium chloride (K-DUR,KLOR-CON) 20 MEQ tablet Take 1 tablet (20 mEq total) by mouth daily. 30 tablet 0   rosuvastatin (CRESTOR) 5 MG tablet Take 1 tablet (5 mg total) by mouth every other day. 45 tablet 1   tamsulosin (FLOMAX) 0.4 MG CAPS capsule Take 1 capsule (0.4 mg total) by mouth daily. 30 capsule 11   No current facility-administered medications for this  visit.   Allergies:  Atorvastatin and Penicillins   Social History: The patient  reports that he quit smoking about 16 years ago. His smoking use included cigarettes. He has never used smokeless tobacco. He reports that he does not drink alcohol and does not use drugs.   Stephens History: The patient's Stephens history includes CAD in his father; CVA in his father; Diabetes in his sister.   ROS:  Please see the history of present illness. Otherwise, complete review of systems is positive for none.  All other systems are reviewed and negative.   Physical Exam: VS:  BP 100/60   Pulse (!) 54   Ht 6' (1.829 m)   Wt 193 lb (87.5 kg)   SpO2 97%   BMI 26.18 kg/m , BMI Body mass index is 26.18 kg/m.  Wt Readings from Last 3 Encounters:  10/16/20 193 lb (87.5 kg)  09/20/20 195 lb (88.5 kg)  09/03/20 199 lb 3.2 oz (90.4 kg)    General: Patient appears comfortable at rest. Neck: Supple, no elevated JVP or carotid bruits, no  thyromegaly. Lungs: Clear to auscultation, nonlabored breathing at rest. Cardiac: Regular rate and rhythm, no S3 or significant systolic murmur, no pericardial rub. Extremities: No pitting edema, distal pulses 2+. Skin: Warm and dry. Musculoskeletal: No kyphosis. Neuropsychiatric: Alert and oriented x3, affect grossly appropriate.  ECG:  December 20, 2019 sinus bradycardia rate of 52, RBBB, possible lateral infarct, possible inferior infarct, age undetermined  Recent Labwork: 09/13/2020: Creatinine, Ser 1.00     Component Value Date/Time   CHOL 135 07/10/2018 0339   TRIG 47 07/10/2018 0339   HDL 41 07/10/2018 0339   CHOLHDL 3.3 07/10/2018 0339   VLDL 9 07/10/2018 0339   LDLCALC 85 07/10/2018 0339    Other Studies Reviewed Today:  Diagnostic Studies 06/2018 echo IMPRESSIONS   1. Severe hypokinesis of the left ventricular, entire inferolateral wall.  2. The left ventricle has mild-moderately reduced systolic function, with an ejection fraction of 40-45%. The  cavity size was normal. There is mild asymmetric left ventricular hypertrophy. Left ventricular diastolic Doppler parameters are consistent  with impaired relaxation.  3. The right ventricle has normal systolc function. The cavity was normal. There is no increase in right ventricular wall thickness. Right ventricular systolic pressure is moderately elevated with an estimated pressure of 63.7 mmHg.  4. Left atrial size was moderately dilated.  5. Right atrial size was moderately dilated.  6. The mitral valve is myxomatous. Mild thickening of the mitral valve leaflet.  7. The aortic valve is tricuspid Mild thickening of the aortic valve Mild calcification of the aortic valve. Aortic valve regurgitation is trivial by color flow Doppler.  8. There is mild dilatation of the aortic root measuring 40 mm.  9. The inferior vena cava was dilated in size with >50% respiratory variability. 10. No intracardiac thrombi or masses were visualized.     07/2018 cath Significant three-vessel coronary artery disease. Segmental 70% proximal to mid LAD.  This is an intermediate stenosis but would likely have positive FFR if tested. Total occlusion of the second and third obtuse marginal branches, the sources of the patient's recent non-Michael elevation myocardial infarction.  The LAD supplies collaterals to the third obtuse marginal which is a large of the 3 obtuse marginals. 85% proximal RCA.  Right dominant coronary anatomy. No aortic valve gradient Mildly elevated LVEDP   RECOMMENDATIONS:   Combination antiplatelet and antithrombotic therapy will place the patient at increased risk for recurrent bleeding. Requires combination therapy because of paroxysmal atrial flutter and significant underlying ischemic heart disease.  Would not recommend either until hemoglobin is stable and above 9 for at least 2 weeks.  Would avoid aspirin.  Perhaps low-dose rivaroxaban 2.5 mg twice daily and Plavix 75 mg/day would be his best  option (based on the [facility] trial).  [facility] used aspirin, but this patient's endoscopy has suggested aspirin will be worse than Plavix as far as bleeding risk is concerned.  Low-dose rivaroxaban and Plavix has never been started as a stroke preventative measure.  No good data in this particular clinical subset. Anti-ischemic therapy with beta-blocker and long-acting nitrates as tolerated by blood pressure. Aggressive secondary risk factor modification including high intensity statin therapy. Will need clinical cardiology and gastroenterology follow-up to coordinate care.  Cardiology follow-up should be within the next 4 weeks. Hemoglobin in 2 and 4 weeks.     Assessment and Plan:  1. CAD in native artery   2. Ischemic cardiomyopathy   3. Atrial fibrillation, unspecified type ([place])   4. Essential hypertension  1. CAD in native artery Denies any anginal or exertional symptoms.  History of prior NSTEMI management complicated by GI bleed and severe anemia.  Currently not on DOAC's due to hematuria.  Seeing Michael Stephens currently urology.  Continue to hold Eliquis.  Continue Imdur 30 mg daily.  Continue Crestor 5 mg daily.  2. Ischemic cardiomyopathy Last echocardiogram showed severe hypokinesis of LV entire inferolateral wall.  EF 40 to 45%.  Mild asymmetric LVH.  Left ventricular end-diastolic parameters consistent with impaired relaxation.  RV systolic pressure moderately elevated with estimated pressure of 63.7 mmHg.  LA size moderately dilated, RA size moderately dilated, mitral valve myxomatous.  Mild dilatation of the aortic root measuring 40 mm.  Continue furosemide 40 to 80 mg daily by mouth.  Take 40 mg p.o. daily on Monday, Wednesday, Friday and Sunday.  Take Lasix 80 on Tuesday, Thursday, Saturday.  3. Atrial fibrillation, unspecified type (HCC) Rate controlled today at 54 continue Toprol-XL 25 mg daily.  Continuing to hold Eliquis for now until cystoscopy and follow-up with urology.   Currently denies any hematuria.  4.  Hypertension BP today 100/60 continue losartan 25 mg p.o. daily.  Medication Adjustments/Labs and Tests Ordered: Current medicines are reviewed at length with the patient today.  Concerns regarding medicines are outlined above.   Disposition: Follow-up with [facility] or APP 6 months  Signed, Levell July, NP 10/16/2020 10:26 AM    [facility] [address], [place], [place] [address] Phone: [phone]; Fax: [phone]

## 2020-10-16 ENCOUNTER — Ambulatory Visit: Payer: Medicare Other | Admitting: Family Medicine

## 2020-10-16 ENCOUNTER — Other Ambulatory Visit: Payer: Self-pay

## 2020-10-16 ENCOUNTER — Encounter: Payer: Self-pay | Admitting: Family Medicine

## 2020-10-16 VITALS — BP 100/60 | HR 54 | Ht 72.0 in | Wt 193.0 lb

## 2020-10-16 DIAGNOSIS — I255 Ischemic cardiomyopathy: Secondary | ICD-10-CM

## 2020-10-16 DIAGNOSIS — I4891 Unspecified atrial fibrillation: Secondary | ICD-10-CM

## 2020-10-16 DIAGNOSIS — I2510 Atherosclerotic heart disease of native coronary artery without angina pectoris: Secondary | ICD-10-CM | POA: Diagnosis not present

## 2020-10-16 DIAGNOSIS — I10 Essential (primary) hypertension: Secondary | ICD-10-CM

## 2020-10-16 NOTE — Patient Instructions (Signed)
Medication Instructions:  Your physician recommends that you continue on your current medications as directed. Please refer to the Current Medication list given to you today.  *If you need a refill on your cardiac medications before your next appointment, please call your pharmacy*   Lab Work: None today  If you have labs (blood work) drawn today and your tests are completely normal, you will receive your results only by: MyChart Message (if you have MyChart) OR A paper copy in the mail If you have any lab test that is abnormal or we need to change your treatment, we will call you to review the results.   Testing/Procedures: None today    Follow-Up: At [facility], you and your health needs are our priority.  As part of our continuing mission to provide you with exceptional heart care, we have created designated Provider Care Teams.  These Care Teams include your primary Cardiologist (physician) and Advanced Practice Providers (APPs -  Physician Assistants and Nurse Practitioners) who all work together to provide you with the care you need, when you need it.  We recommend signing up for the patient portal called "MyChart".  Sign up information is provided on this After Visit Summary.  MyChart is used to connect with patients for Virtual Visits (Telemedicine).  Patients are able to view lab/test results, encounter notes, upcoming appointments, etc.  Non-urgent messages can be sent to your provider as well.   To learn more about what you can do with MyChart, go to [email or web address].    Your next appointment:   6 month(s)  The format for your next appointment:   In Person  Provider:   Andy Quinn, NP   Other Instructions None   

## 2020-10-18 ENCOUNTER — Ambulatory Visit: Payer: Medicare Other | Admitting: Urology

## 2020-10-26 ENCOUNTER — Telehealth: Payer: Self-pay | Admitting: Cardiology

## 2020-10-26 ENCOUNTER — Encounter: Payer: Self-pay | Admitting: *Deleted

## 2020-10-26 MED ORDER — METOPROLOL SUCCINATE ER 25 MG PO TB24: 50.0000 mg | ORAL_TABLET | Freq: Every day | ORAL | 1 refills | Status: DC

## 2020-10-26 NOTE — Telephone Encounter (Signed)
Per phone call from pt's wife- pt was seen by PCP today and was told that his heart rate was 150 and they did an EKG. States it's not coming down with the medication   Please call Joaquim Lai @ [phone]

## 2020-10-26 NOTE — Telephone Encounter (Signed)
Take extra toprol 25mg  today and change his daily dose to 50mg  daily. (Confirm first he has been taking 25mg  daily). If rates remain elevated in the 150s over the weekend would come to ER   Zandra Abts MD

## 2020-10-26 NOTE — Telephone Encounter (Signed)
Per wife, since yesterday, HR has been ranging 155-157 and is being checked by BP cuff. Reports he feels his heart beating fast. Reports SOB all the time and is unchanged. Reports intermittent nausea & chest pain rated 4-5/10 for the past week and has not taken any pain meds and does not have nitroglycerin on hand. Denies dizziness, cough, congestion or fever.   Per wife, patient was seen by PCP today and was instructed to call our office.  BP 105/78 and HR 148- taken while on phone  Denies active chest pain Advised that message would be sent to provider and if symptoms got worse, to go to the ED for an evaluation Verbalized understanding of plan

## 2020-10-26 NOTE — Telephone Encounter (Signed)
Per wife, patient is currently taking toprol xl 25 mg daily Patient's wife informed and verbalized understanding of plan.

## 2020-10-26 NOTE — Telephone Encounter (Signed)
Records requested from PCP [facility] and [facility] contacted. Medications reviewed and reconciled

## 2020-12-14 ENCOUNTER — Ambulatory Visit (HOSPITAL_COMMUNITY)
Admission: RE | Admit: 2020-12-14 | Discharge: 2020-12-14 | Disposition: A | Discharge status: Home / Self Care | Payer: Medicare Other | Source: Ambulatory Visit | Attending: Urology | Admitting: Urology

## 2020-12-14 DIAGNOSIS — N1330 Unspecified hydronephrosis: Secondary | ICD-10-CM | POA: Diagnosis not present

## 2020-12-20 ENCOUNTER — Ambulatory Visit: Payer: Medicare Other | Admitting: Urology

## 2020-12-20 NOTE — Progress Notes (Signed)
Sent via mail 

## 2020-12-21 ENCOUNTER — Telehealth: Payer: Self-pay | Admitting: Urology

## 2020-12-21 NOTE — Telephone Encounter (Signed)
Patient wife asking for ultrasound results.

## 2020-12-21 NOTE — Telephone Encounter (Signed)
Pt's wife called and wanted to know the results of her husbands RUS he had done. Please call her when you can.

## 2020-12-24 NOTE — Telephone Encounter (Signed)
Patient wife called and notified of results.

## 2021-02-05 ENCOUNTER — Emergency Department (HOSPITAL_COMMUNITY): Payer: Medicare Other

## 2021-02-05 ENCOUNTER — Encounter (HOSPITAL_COMMUNITY): Payer: Self-pay | Admitting: *Deleted

## 2021-02-05 ENCOUNTER — Emergency Department (HOSPITAL_COMMUNITY)
Admission: EM | Admit: 2021-02-05 | Discharge: 2021-02-05 | Disposition: A | Discharge status: Home / Self Care | Payer: Medicare Other | Attending: Emergency Medicine | Admitting: Emergency Medicine

## 2021-02-05 DIAGNOSIS — Z79899 Other long term (current) drug therapy: Secondary | ICD-10-CM | POA: Diagnosis not present

## 2021-02-05 DIAGNOSIS — I4891 Unspecified atrial fibrillation: Secondary | ICD-10-CM | POA: Diagnosis not present

## 2021-02-05 DIAGNOSIS — Z955 Presence of coronary angioplasty implant and graft: Secondary | ICD-10-CM | POA: Diagnosis not present

## 2021-02-05 DIAGNOSIS — I5043 Acute on chronic combined systolic (congestive) and diastolic (congestive) heart failure: Secondary | ICD-10-CM | POA: Insufficient documentation

## 2021-02-05 DIAGNOSIS — Z7901 Long term (current) use of anticoagulants: Secondary | ICD-10-CM | POA: Insufficient documentation

## 2021-02-05 DIAGNOSIS — Z87891 Personal history of nicotine dependence: Secondary | ICD-10-CM | POA: Diagnosis not present

## 2021-02-05 DIAGNOSIS — D649 Anemia, unspecified: Secondary | ICD-10-CM | POA: Insufficient documentation

## 2021-02-05 DIAGNOSIS — N189 Chronic kidney disease, unspecified: Secondary | ICD-10-CM | POA: Diagnosis not present

## 2021-02-05 DIAGNOSIS — R0602 Shortness of breath: Secondary | ICD-10-CM

## 2021-02-05 DIAGNOSIS — I130 Hypertensive heart and chronic kidney disease with heart failure and stage 1 through stage 4 chronic kidney disease, or unspecified chronic kidney disease: Secondary | ICD-10-CM | POA: Insufficient documentation

## 2021-02-05 LAB — CBC
HCT: 35.9 % — ABNORMAL LOW (ref 39.0–52.0)
Hemoglobin: 11.4 g/dL — ABNORMAL LOW (ref 13.0–17.0)
MCH: 31.8 pg (ref 26.0–34.0)
MCHC: 31.8 g/dL (ref 30.0–36.0)
MCV: 100.3 fL — ABNORMAL HIGH (ref 80.0–100.0)
Platelets: 141 10*3/uL — ABNORMAL LOW (ref 150–400)
RBC: 3.58 MIL/uL — ABNORMAL LOW (ref 4.22–5.81)
RDW: 16.9 % — ABNORMAL HIGH (ref 11.5–15.5)
WBC: 5.3 10*3/uL (ref 4.0–10.5)
nRBC: 0 % (ref 0.0–0.2)

## 2021-02-05 LAB — BASIC METABOLIC PANEL
Anion gap: 9 (ref 5–15)
BUN: 38 mg/dL — ABNORMAL HIGH (ref 8–23)
CO2: 30 mmol/L (ref 22–32)
Calcium: 9.3 mg/dL (ref 8.9–10.3)
Chloride: 99 mmol/L (ref 98–111)
Creatinine, Ser: 1.22 mg/dL (ref 0.61–1.24)
GFR, Estimated: 58 mL/min — ABNORMAL LOW (ref >60)
Glucose, Bld: 170 mg/dL — ABNORMAL HIGH (ref 70–99)
Potassium: 4 mmol/L (ref 3.5–5.1)
Sodium: 138 mmol/L (ref 135–145)

## 2021-02-05 LAB — BRAIN NATRIURETIC PEPTIDE: B Natriuretic Peptide: 591 pg/mL — ABNORMAL HIGH (ref 0.0–100.0)

## 2021-02-05 LAB — TROPONIN I (HIGH SENSITIVITY)
Troponin I (High Sensitivity): 23 ng/L — ABNORMAL HIGH (ref <18)
Troponin I (High Sensitivity): 23 ng/L — ABNORMAL HIGH (ref <18)

## 2021-02-05 NOTE — ED Triage Notes (Signed)
Recurrent shortness of breath, unable to lay flat when breathing

## 2021-02-05 NOTE — Discharge Instructions (Signed)
I think you have acute on chronic CHF exacerbation which requires hospital admission for diuresis.  Please continue with all home medications, if you decide to change your mind please come back here for further evaluation.  I highly recommend he call Dr. Nelly Laurence office tomorrow for a follow-up appointment.  Come back to the emergency department if you develop chest pain, shortness of breath, severe abdominal pain, uncontrolled nausea, vomiting, diarrhea.

## 2021-02-05 NOTE — ED Provider Notes (Signed)
[facility] EMERGENCY DEPARTMENT Provider Note   CSN: [id number] Arrival date & time: 02/05/21  1334     History Chief Complaint  Patient presents with   Shortness of Breath    Michael Stephens is a 86 y.o. male.  HPI  Patient with significant medical history including diastolic CHF grade 1, NSTEMI, CKD, presents to the emergency department chief complaint of shortness of breath.  Patient states he has felt more short of breath over the last couple days, states that is mainly at nighttime when he lays flat on his back, he feels as if he cannot get a full breath in, he admits to worsening pedal edema, states his legs have been more swollen, he denies worsening dyspnea on exertion, denies associated chest pain, pleuritic chest pain, feel lightheaded, dizziness, become diaphoretic, generalized weakness nausea or vomiting.  States that he has been taking his Lasix as prescribed and is urinating but is still feeling like he is volume overloaded.  He has no other complaints at this time.  He does not endorse fevers, chills, nasal congestion, sore throat, cough, stomach pain.  Patient states he has no chest pain at this time, has no other complaints.  Denies any leaving or aggravating factors.  After reviewing patient's chart patient recently had a cath done on 04/01 of this year showing that he has three-vessel coronary disease, he is got an EF of 40 to 41% grade 1 diastolic heart failure, and atrial for but currently on Eliquis due to GI bleeds.  Patient takes 40 mg of Lasix Monday Wednesday Friday Sunday 80 mg Lasix on Tuesday Thursdays.  Past Medical History:  Diagnosis Date   Acute diastolic CHF (congestive heart failure) ([place]) 02/11/2017   Anemia    CHRONIC    Atrial flutter ([place])    a. diagnosed in 02/2017. Rate-control strategy pursued.    BPH (benign prostatic hyperplasia)    Chronic diastolic heart failure (HCC)    Dyspnea    Hypertension    NSTEMI (non-ST elevated myocardial  infarction) [facility])    Renal disorder    cyst on kidney    Patient Active Problem List   Diagnosis Date Noted   Acute coronary syndrome ([place]) 08/05/2018   Symptomatic anemia 08/05/2018   Hypokalemia 08/05/2018   Hyperglycemia 08/05/2018   NSTEMI (non-ST elevated myocardial infarction) ([place]) 07/09/2018   Anemia 07/08/2018   Heme positive stool 07/08/2018   Coagulopathy ([place]) 07/08/2018   Chronic diastolic CHF (congestive heart failure) ([place]) 07/08/2018   Upper GI bleed 07/08/2018   Shortness of breath 04/01/2018   BPH (benign prostatic hyperplasia) 04/01/2018   AF (paroxysmal atrial fibrillation) ([place]) 04/01/2018   Acute on chronic systolic heart failure ([place]) 03/31/2018   Urinary retention 09/07/2017   Constipation 09/07/2017   CHF exacerbation ([place]) 09/04/2017   Acute on chronic diastolic CHF (congestive heart failure) ([place]) 09/04/2017   Change in stool 03/18/2017   Diarrhea 03/02/2017   Aortic stenosis 03/02/2017   CAP (community acquired pneumonia) 02/16/2017   Essential hypertension 02/11/2017   Demand ischemia ([place]) 66/09/3014   Acute diastolic CHF (congestive heart failure) ([place]) 02/11/2017   Acute respiratory failure with hypoxia ([place]) 02/11/2017    Past Surgical History:  Procedure Laterality Date   ABCESS DRAINAGE  11/2008   BUTTOCKS   BIOPSY  07/13/2018   Procedure: BIOPSY;  Surgeon: Otis Brace, MD;  Location: [place];  Service: Gastroenterology;;   CATARACT EXTRACTION, BILATERAL     COLONOSCOPY N/A 04/02/2017   Procedure: COLONOSCOPY;  Surgeon: Rogene Houston, MD;  [facility];  Service: Endoscopy;  Laterality: N/A;  10:55   COLONOSCOPY WITH PROPOFOL N/A 07/13/2018   Procedure: COLONOSCOPY WITH PROPOFOL;  Surgeon: Otis Brace, MD;  Location: [place];  Service: Gastroenterology;  Laterality: N/A;   ESOPHAGOGASTRODUODENOSCOPY (EGD) WITH PROPOFOL N/A 07/09/2018   Procedure: ESOPHAGOGASTRODUODENOSCOPY (EGD) WITH PROPOFOL;  Surgeon:  Wilford Corner, MD;  Location: [place];  Service: Endoscopy;  Laterality: N/A;   GIVENS CAPSULE STUDY N/A 08/06/2018   Procedure: GIVENS CAPSULE STUDY;  Surgeon: Ronald Lobo, MD;  Location: [place];  Service: Endoscopy;  Laterality: N/A;   LEFT HEART CATH AND CORONARY ANGIOGRAPHY N/A 07/14/2018   Procedure: LEFT HEART CATH AND CORONARY ANGIOGRAPHY;  Surgeon: Belva Crome, MD;  Location: [place] [facility];  Service: Cardiovascular;  Laterality: N/A;   POLYPECTOMY  04/02/2017   Procedure: POLYPECTOMY;  Surgeon: Rogene Houston, MD;  [facility];  Service: Endoscopy;;  asceding colon(hot snare)/ sigmoid colon times two (cold snare)   POLYPECTOMY  07/13/2018   Procedure: POLYPECTOMY;  Surgeon: Otis Brace, MD;  [facility];  Service: Gastroenterology;;       Family History  Problem Relation Age of Onset   CVA Father    CAD Father    Diabetes Sister    Colon cancer Neg Hx     Social History   Tobacco Use   Smoking status: Former    Types: Cigarettes    Quit date: 2006    Years since quitting: 16.8   Smokeless tobacco: Never  Vaping Use   Vaping Use: Never used  Substance Use Topics   Alcohol use: No   Drug use: No    Home Medications Prior to Admission medications   Medication Sig Start Date End Date Taking? Authorizing Provider  alum & mag hydroxide-simeth (MAALOX/MYLANTA) 200-200-20 MG/5ML suspension Take 30 mLs by mouth every 4 (four) hours as needed for indigestion or heartburn. 08/09/18   [name], MD  ELIQUIS 5 MG TABS tablet TAKE 1 TABLET BY MOUTH  TWICE DAILY Patient taking differently: PLEASE HOLD 04/26/20   Arnoldo Lenis, MD  finasteride (PROSCAR) 5 MG tablet Take 5 mg by mouth daily.    [provider]  furosemide (LASIX) 80 MG tablet Take 0.5-1 tablets (40-80 mg total) by mouth daily. Take 40 mg p.o. daily on Monday, Wednesday, Friday, and Sunday.  Take Lasix 80 mg p.o. daily on Tuesday, Thursday, and  Saturday 08/18/18   Arnoldo Lenis, MD  isosorbide mononitrate (IMDUR) 30 MG 24 hr tablet Take 1 tablet (30 mg total) by mouth at bedtime. 08/18/18   Arnoldo Lenis, MD  losartan (COZAAR) 25 MG tablet Take 1 tablet (25 mg total) by mouth daily. 09/09/17 12/19/20  Johnson, Clanford L, MD  metoprolol succinate (TOPROL XL) 25 MG 24 hr tablet Take 2 tablets (50 mg total) by mouth daily. 10/26/20   Arnoldo Lenis, MD  pantoprazole (PROTONIX) 40 MG tablet Take 40 mg by mouth 2 (two) times daily.    [provider]  potassium chloride (K-DUR,KLOR-CON) 20 MEQ tablet Take 1 tablet (20 mEq total) by mouth daily. 09/09/17 12/19/20  Johnson, Clanford L, MD  rosuvastatin (CRESTOR) 5 MG tablet Take 5 mg by mouth daily.    [provider]  tamsulosin (FLOMAX) 0.4 MG CAPS capsule Take 1 capsule (0.4 mg total) by mouth daily. 09/20/20   Irine Seal, MD    Allergies    Atorvastatin and Penicillins  Review of Systems   Review of Systems  Constitutional:  Negative for chills and fever.  HENT:  Negative for congestion.   Respiratory:  Positive for shortness of breath.   Cardiovascular:  Positive for leg swelling. Negative for chest pain.  Gastrointestinal:  Negative for abdominal pain, diarrhea, nausea and vomiting.  Genitourinary:  Negative for enuresis.  Musculoskeletal:  Negative for back pain.  Skin:  Negative for rash.  Neurological:  Negative for dizziness.  Hematological:  Does not bruise/bleed easily.   Physical Exam Updated Vital Signs BP 115/88   Pulse 69   Temp 98.3 F (36.8 C) (Oral)   Resp 17   SpO2 96%   Physical Exam Vitals and nursing note reviewed.  Constitutional:      General: He is not in acute distress.    Appearance: He is not ill-appearing.  HENT:     Head: Normocephalic and atraumatic.     Nose: No congestion.  Eyes:     Conjunctiva/sclera: Conjunctivae normal.  Neck:     Comments: No JVD present. Cardiovascular:     Rate and Rhythm: Normal rate and  regular rhythm.     Pulses: Normal pulses.     Heart sounds: No murmur heard.   No friction rub. No gallop.  Pulmonary:     Effort: No respiratory distress.     Breath sounds: No wheezing, rhonchi or rales.     Comments: Patient not in any respiratory distress, he is speaking full sentences, slightly tachypneic, patient has noted Rales heard in the lower lobes, worse on the right versus the left, no wheezing, rhonchi, stridor present. Abdominal:     Palpations: Abdomen is soft.     Tenderness: There is no abdominal tenderness. There is no right CVA tenderness or left CVA tenderness.     Comments: Abdomen nondistended, normal bowel sounds, dull to percussion, nontender to palpation, no fluid wave, no guarding, rebound tenderness, peritoneal sign.  Musculoskeletal:     Right lower leg: Edema present.     Left lower leg: Edema present.     Comments: 2+ pitting edema up to the ankles, neurovascular fully intact, no skin changes present.  Skin:    General: Skin is warm and dry.  Neurological:     Mental Status: He is alert.  Psychiatric:        Mood and Affect: Mood normal.    ED Results / Procedures / Treatments   Labs (all labs ordered are listed, but only abnormal results are displayed) Labs Reviewed  BASIC METABOLIC PANEL - Abnormal; Notable for the following components:      Result Value   Glucose, Bld 170 (*)    BUN 38 (*)    GFR, Estimated 58 (*)    All other components within normal limits  CBC - Abnormal; Notable for the following components:   RBC 3.58 (*)    Hemoglobin 11.4 (*)    HCT 35.9 (*)    MCV 100.3 (*)    RDW 16.9 (*)    Platelets 141 (*)    All other components within normal limits  BRAIN NATRIURETIC PEPTIDE - Abnormal; Notable for the following components:   B Natriuretic Peptide 591.0 (*)    All other components within normal limits  TROPONIN I (HIGH SENSITIVITY) - Abnormal; Notable for the following components:   Troponin I (High Sensitivity) 23 (*)     All other components within normal limits  TROPONIN I (HIGH SENSITIVITY) - Abnormal; Notable for the following  components:   Troponin I (High Sensitivity) 23 (*)    All other components within normal limits    EKG EKG Interpretation  Date/Time:  Tuesday February 05 2021 14:08:56 EDT Ventricular Rate:  120 PR Interval:    QRS Duration: 126 QT Interval:  352 QTC Calculation: 497 R Axis:   -42 Text Interpretation: Atrial fibrillation with rapid ventricular response with premature ventricular or aberrantly conducted complexes Left axis deviation Right bundle branch block Abnormal ECG Confirmed by Milton Ferguson [phone]) on 02/05/2021 5:08:05 PM  Radiology DG Chest 2 View  Result Date: 02/05/2021 CLINICAL DATA:  Shortness of breath. EXAM: CHEST - 2 VIEW COMPARISON:  August 08, 2018. FINDINGS: Stable cardiomegaly with mild central pulmonary vascular congestion. Possible minimal bibasilar edema or atelectasis is noted. Possible nodular density seen laterally in right midlung. The visualized skeletal structures are unremarkable. IMPRESSION: Stable cardiomegaly with mild central pulmonary vascular congestion and possible minimal bibasilar edema or atelectasis. Possible nodular density is noted laterally in right midlung; CT scan of the chest is recommended for further evaluation. Electronically Signed   By: Marijo Conception M.D.   On: 02/05/2021 14:58    Procedures Procedures   Medications Ordered in ED Medications - No data to display  ED Course  I have reviewed the triage vital signs and the nursing notes.  Pertinent labs & imaging results that were available during my care of the patient were reviewed by me and considered in my medical decision making (see chart for details).    MDM Rules/Calculators/A&P                          Initial impression-patient presents with shortness of breath.  He is alert, does not appear in acute distress, vital signs reassuring.  Concern for possible  volume overload versus acute on chronic CHF exacerbation.  Will obtain basic lab work-up, and reassess.  Work-up-CBC shows normocytic anemia hemoglobin 11.4, BMP shows glucose of 170, BUN of 38, BNP elevated at 591, first troponin is 23, second troponin is 23.  Chest x-ray reveals stable cardiomegaly mild central pulmonary vascular congestion possible minimal bibasilar edema and or atelectasis, EKG is A. fib without signs of ischemia.  Reassessment-due to patient's significant cardiac history with slightly elevated troponins, will speak with cardiology for further recommendations.  Notified by patient that he does not want to admit to the hospital, he states he is feeling anxious does not want to remain here.  I explained to him that he really should stay as I do not feel that you are safe to go home due to your is in the high risk of this complaint.  He states that he understood but would rather go home.  He states he will consult with his cardiologist tomorrow and then will come back tomorrow if he needs to.  Rule out- I have low suspicion for ACS as history is atypical, EKG  without signs of ischemia, patient does have an elevated troponin but is only mildly elevated has plateaued.  Possible this is his baseline does have CKD.  Low suspicion for PE as patient denies pleuritic chest pain, no unilateral leg swelling, he is not hypoxic, nontachycardic.  He does have noted pedal edema they are equal bilaterally and he was noted to be tachypneic but I feel this is likely secondary due to volume overloaded as he has an elevated BNP with rales present my exam, History of CHF likely acute on chronic CHF  exacerbation.  Low suspicion for AAA or aortic dissection as history is atypical, patient has low risk factors.  Low suspicion for systemic infection as patient is nontoxic-appearing, vital signs reassuring, no obvious source infection noted on exam.   Plan- We discussed the nature and purpose, risks and  benefits, as well as, the alternatives of treatment. Time was given to allow the opportunity to ask questions and consider their options, and after the discussion, the patient decided to refuse the offerred treatment. The patient was informed that refusal could lead to, but was not limited to, death, permanent disability, or severe pain. If present, I asked the relatives or significant others to dissuade them without success. Prior to refusing, I determined that the patient had the capacity to make their decision and understood the consequences of that decision. After refusal, I made every reasonable opportunity to treat them to the best of my ability.  The patient was notified that they may return to the emergency department at any time for further treatment.     Final Clinical Impression(s) / ED Diagnoses Final diagnoses:  SOB (shortness of breath)  Acute on chronic combined systolic and diastolic CHF (congestive heart failure) [facility])    Rx / DC Orders ED Discharge Orders     None        Marcello Fennel, PA-C 02/05/21 1837    Lorelle Gibbs, DO 02/06/21 [phone]

## 2021-02-07 NOTE — Progress Notes (Signed)
Cardiology Office Note  Date: 02/08/2021   ID: Michael Stephens, DOB June 17, 1934, MRN [id number]  PCP:  [facility]  Cardiologist:  Carlyle Dolly, MD Electrophysiologist:  None   Chief Complaint:  History of Present Illness: Michael Stephens is a 86 y.o. male with a history of CAD, ischemic cardiomyopathy, atrial fibrillation.,  Hypertension, NSTEMI, dyspnea, anemia, diastolic heart failure.   Last seen by Dr. Harl Bowie on 06/20/2020.  His Eliquis had previously been stopped due to hematuria and was following neurology.  He had a pending cystoscopy and hematuria work-up at [facility] on June 12 and June 9 respectively.  Currently denied any hematuria.  He was compliant with his statin medication.  Last LDL July 2021 was 60.  His COPD was stable.  Denied DOE or SOB.  He denied any anginal or exertional symptoms, palpitations or arrhythmias, orthostatic symptoms, CVA or TIA-like symptoms.  Patient had a cystoscopy on 09/20/2020 for hematuria work-up.  He had a CT on 09/13/2020 demonstrating bilateral hydronephrosis  which was not present on a previous CT in 2020.  This was thought to be secondary to chronic bladder outlet obstruction with probable reflux versus bladder wall thickening.  There were no findings to suggest malignant obstruction.  UroLift and TURP were discussed.  Patient however wanted to try additional medications since his creatinine was normal. It was thought reasonable to try him on tamsulosin check PVR and renal ultrasound in 3 months.  He was last here for follow up.  He was still undergoing treatment with Dr. Jeffie Pollock for hematuria.  He had an upcoming cystoscopy and possible biopsy with Dr. Jeffie Pollock.  He denied any further bleeding since stopping the Eliquis.  Denied any anginal or exertional symptoms, orthostatic symptoms, CVA or TIA-like symptoms, PND, orthopnea, DOE or SOB.  Denies any claudication-like symptoms, DVT or PE-like symptoms, or lower extremity edema.   Spoke with Dr. Jeffie Pollock via secure chat. He stated patient had atypical cytology that was suspicious for malignancy.  He was supposed to be getting to see Dr. Jeffie Pollock to discuss a biopsy which he stated he was not sure he he wanted.  Dr. Jeffie Pollock mentioned he would be at increased bleeding risk if Eliquis was restarted at this point.  Recent presentation to emergency department on 02/05/2021 with complaint of shortness of breath.  He described more shortness of breath over the prior couple of days mainly at nighttime when laying flat stating he felt he could not get a full breath.  Also complaining of worsening pedal edema with more swelling in his legs.  He was taking his Lasix as prescribed but still feeling volume overloaded.  His BNP was 591, troponin 23-23.  Chest x-ray revealed stable cardiomegaly with mild central pulmonary vascular congestion with possible minimal bibasilar edema and/or atelectasis.  Patient eventually left AMA.  He is here today for follow-up from recent emergency department visit.  He states he has intermittent shortness of breath.  States sometimes when he lays flat he becomes shortness of breath.  He states it seems to come and go, it is not consistent.  He states he sometimes gets a little short of breath when he goes outside to feed his animals but sometimes not.  He does have a history of atrial fibrillation.  Currently not on Eliquis due to hematuria.  He is following with Dr. Jeffie Pollock urology.  He states he has not completed his urology work-up yet and has not started Eliquis back due to his history of  hematuria.  His wife states she started giving him a whole pill of Lasix earlier this week and is lower extremity edema has improved.  The medication list has parameters for taking Lasix is listed as take 0.5 to 1 mg tablets (40 to 80 mg total) by mouth daily.  Take 40 mg p.o. daily on Monday, Wednesday, Friday, Sunday, take Lasix 80 mg p.o. daily on Tuesday, Thursday, Saturday.  His wife  states he drinks fairly excessive amount of fluid.  She states he drinks approximately a half a gallon of buttermilk per day with at least 6 cups of coffee every day if not more and orange juice during the day and sometimes during the night.  Not sure of total ounces.  We discussed guidelines for fluid restrictions for heart failure.  We discussed sodium restrictions.  We discussed weighing every day.  Patient does not weigh every day.  We discussed a follow-up echocardiogram to recheck LV function, diastolic function, valvular function.  He states sometimes when he bends over and attempts to straighten back up he has some shortness of breath (bendopnea).    Past Medical History:  Diagnosis Date   Acute diastolic CHF (congestive heart failure) ([place]) 02/11/2017   Anemia    CHRONIC    Atrial flutter ([place])    a. diagnosed in 02/2017. Rate-control strategy pursued.    BPH (benign prostatic hyperplasia)    Chronic diastolic heart failure (H[facility])    Dyspnea    Hypertension    NSTEMI (non-ST elevated myocardial infarction) [facility])    Renal disorder    cyst on kidney    Past Surgical History:  Procedure Laterality Date   ABCESS DRAINAGE  11/2008   BUTTOCKS   BIOPSY  07/13/2018   Procedure: BIOPSY;  Surgeon: Otis Brace, MD;  Location: [place];  Service: Gastroenterology;;   CATARACT EXTRACTION, BILATERAL     COLONOSCOPY N/A 04/02/2017   Procedure: COLONOSCOPY;  Surgeon: Rogene Houston, MD;  [facility];  Service: Endoscopy;  Laterality: N/A;  10:55   COLONOSCOPY WITH PROPOFOL N/A 07/13/2018   Procedure: COLONOSCOPY WITH PROPOFOL;  Surgeon: Otis Brace, MD;  Location: [place];  Service: Gastroenterology;  Laterality: N/A;   ESOPHAGOGASTRODUODENOSCOPY (EGD) WITH PROPOFOL N/A 07/09/2018   Procedure: ESOPHAGOGASTRODUODENOSCOPY (EGD) WITH PROPOFOL;  Surgeon: Wilford Corner, MD;  Location: [place];  Service: Endoscopy;  Laterality: N/A;   GIVENS CAPSULE STUDY  N/A 08/06/2018   Procedure: GIVENS CAPSULE STUDY;  Surgeon: Ronald Lobo, MD;  Location: [place];  Service: Endoscopy;  Laterality: N/A;   LEFT HEART CATH AND CORONARY ANGIOGRAPHY N/A 07/14/2018   Procedure: LEFT HEART CATH AND CORONARY ANGIOGRAPHY;  Surgeon: Belva Crome, MD;  Location: [place] [facility];  Service: Cardiovascular;  Laterality: N/A;   POLYPECTOMY  04/02/2017   Procedure: POLYPECTOMY;  Surgeon: Rogene Houston, MD;  [facility];  Service: Endoscopy;;  asceding colon(hot snare)/ sigmoid colon times two (cold snare)   POLYPECTOMY  07/13/2018   Procedure: POLYPECTOMY;  Surgeon: Otis Brace, MD;  Location: [place] [facility];  Service: Gastroenterology;;    Current Outpatient Medications  Medication Sig Dispense Refill   alum & mag hydroxide-simeth (MAALOX/MYLANTA) 200-200-20 MG/5ML suspension Take 30 mLs by mouth every 4 (four) hours as needed for indigestion or heartburn. 355 mL 0   finasteride (PROSCAR) 5 MG tablet Take 5 mg by mouth daily.     furosemide (LASIX) 40 MG tablet Take 1.5 tablets (60 mg total) by mouth daily. For 5 days,  then reduce to 40 mg daily 90 tablet 3   isosorbide mononitrate (IMDUR) 30 MG 24 hr tablet Take 1 tablet (30 mg total) by mouth at bedtime. 90 tablet 3   losartan (COZAAR) 25 MG tablet Take 25 mg by mouth daily.     metoprolol succinate (TOPROL XL) 25 MG 24 hr tablet Take 2 tablets (50 mg total) by mouth daily. 180 tablet 1   pantoprazole (PROTONIX) 40 MG tablet Take 40 mg by mouth 2 (two) times daily.     potassium chloride SA (KLOR-CON) 20 MEQ tablet Take 20 mEq by mouth daily.     rosuvastatin (CRESTOR) 5 MG tablet Take 5 mg by mouth daily.     tamsulosin (FLOMAX) 0.4 MG CAPS capsule Take 1 capsule (0.4 mg total) by mouth daily. 30 capsule 11   ELIQUIS 5 MG TABS tablet TAKE 1 TABLET BY MOUTH  TWICE DAILY (Patient not taking: Reported on 02/08/2021) 180 tablet 3   No current facility-administered medications for this visit.    Allergies:  Atorvastatin and Penicillins   Social History: The patient  reports that he quit smoking about 16 years ago. His smoking use included cigarettes. He has never used smokeless tobacco. He reports that he does not drink alcohol and does not use drugs.   Family History: The patient's family history includes CAD in his father; CVA in his father; Diabetes in his sister.   ROS:  Please see the history of present illness. Otherwise, complete review of systems is positive for none.  All other systems are reviewed and negative.   Physical Exam: VS:  BP 110/70   Pulse (!) 58   Ht 6' (1.829 m)   Wt 191 lb 12.8 oz (87 kg)   SpO2 96%   BMI 26.01 kg/m , BMI Body mass index is 26.01 kg/m.  Wt Readings from Last 3 Encounters:  02/08/21 191 lb 12.8 oz (87 kg)  10/16/20 193 lb (87.5 kg)  09/20/20 195 lb (88.5 kg)    General: Patient appears comfortable at rest. Neck: Supple, no elevated JVP or carotid bruits, no thyromegaly. Lungs: Clear to auscultation, nonlabored breathing at rest. Cardiac: Regular rate and rhythm, no S3 or significant systolic murmur, no pericardial rub. Extremities: No pitting edema, distal pulses 2+. Skin: Warm and dry. Musculoskeletal: No kyphosis. Neuropsychiatric: Alert and oriented x3, affect grossly appropriate.  ECG:  December 20, 2019 sinus bradycardia rate of 52, RBBB, possible lateral infarct, possible inferior infarct, age undetermined  Recent Labwork: 02/05/2021: B Natriuretic Peptide 591.0; BUN 38; Creatinine, Ser 1.22; Hemoglobin 11.4; Platelets 141; Potassium 4.0; Sodium 138     Component Value Date/Time   CHOL 135 07/10/2018 0339   TRIG 47 07/10/2018 0339   HDL 41 07/10/2018 0339   CHOLHDL 3.3 07/10/2018 0339   VLDL 9 07/10/2018 0339   LDLCALC 85 07/10/2018 0339    Other Studies Reviewed Today:  Chest x-ray 02/05/2021 IMPRESSION: Stable cardiomegaly with mild central pulmonary vascular congestion and possible minimal bibasilar edema  or atelectasis. Possible nodular density is noted laterally in right midlung; CT scan of the chest is recommended for further evaluation.    Diagnostic Studies 06/2018 echo IMPRESSIONS   1. Severe hypokinesis of the left ventricular, entire inferolateral wall.  2. The left ventricle has mild-moderately reduced systolic function, with an ejection fraction of 40-45%. The cavity size was normal. There is mild asymmetric left ventricular hypertrophy. Left ventricular diastolic Doppler parameters are consistent  with impaired relaxation.  3. The right ventricle  has normal systolc function. The cavity was normal. There is no increase in right ventricular wall thickness. Right ventricular systolic pressure is moderately elevated with an estimated pressure of 63.7 mmHg.  4. Left atrial size was moderately dilated.  5. Right atrial size was moderately dilated.  6. The mitral valve is myxomatous. Mild thickening of the mitral valve leaflet.  7. The aortic valve is tricuspid Mild thickening of the aortic valve Mild calcification of the aortic valve. Aortic valve regurgitation is trivial by color flow Doppler.  8. There is mild dilatation of the aortic root measuring 40 mm.  9. The inferior vena cava was dilated in size with >50% respiratory variability. 10. No intracardiac thrombi or masses were visualized.     07/2018 cath Significant three-vessel coronary artery disease. Segmental 70% proximal to mid LAD.  This is an intermediate stenosis but would likely have positive FFR if tested. Total occlusion of the second and third obtuse marginal branches, the sources of the patient's recent non-ST elevation myocardial infarction.  The LAD supplies collaterals to the third obtuse marginal which is a large of the 3 obtuse marginals. 85% proximal RCA.  Right dominant coronary anatomy. No aortic valve gradient Mildly elevated LVEDP   RECOMMENDATIONS:   Combination antiplatelet and antithrombotic therapy  will place the patient at increased risk for recurrent bleeding. Requires combination therapy because of paroxysmal atrial flutter and significant underlying ischemic heart disease.  Would not recommend either until hemoglobin is stable and above 9 for at least 2 weeks.  Would avoid aspirin.  Perhaps low-dose rivaroxaban 2.5 mg twice daily and Plavix 75 mg/day would be his best option (based on the [facility] trial).  [facility] used aspirin, but this patient's endoscopy has suggested aspirin will be worse than Plavix as far as bleeding risk is concerned.  Low-dose rivaroxaban and Plavix has never been started as a stroke preventative measure.  No good data in this particular clinical subset. Anti-ischemic therapy with beta-blocker and long-acting nitrates as tolerated by blood pressure. Aggressive secondary risk factor modification including high intensity statin therapy. Will need clinical cardiology and gastroenterology follow-up to coordinate care.  Cardiology follow-up should be within the next 4 weeks. Hemoglobin in 2 and 4 weeks.   Diagnostic Dominance: Right    Assessment and Plan:  1. Chronic diastolic heart failure ([place])   2. CAD in native artery   3. Ischemic cardiomyopathy   4. Atrial fibrillation, unspecified type ([place])   5. Essential hypertension   6. Medication management   7. Shortness of breath     1.  Chronic diastolic heart failure  Recent ER visit with complaints of shortness of breath/dyspnea, bendopnea, sensation of fluid overload.  His wife states he has been taking a half a pill every day but she recently increased it earlier in this week to 1 full pill.  The medication list 40 to 80 mg dosages.  Apparently she was giving him 20 mg of Lasix on Monday, Wednesday, Friday, Sunday and is unsure of the dosage she was given on Tuesday, Thursday and Saturday.  It appears she was giving him only 20 mg daily until earlier this week when she started giving a "whole pill" 40 mg daily.   His weight today is 191.  He states that shortness of breath is not persistent.  States he has some days with shortness of breath and some days not.  We discussed sodium restrictions of 2 g daily, fluid restrictions of approximately 64 ounces per day, weighing every  day.  I told his wife to increase Lasix to 60 mg daily for the next 5 days then revert back to 40 mg daily thereafter.  I did tell her if patient gains 3 pounds in 24 hours or 5 pounds in a week he may take an extra 20 mg in addition to the 40 mg daily.  I asked her to record his weights and call us with an update in 2 weeks regarding weights, symptoms, edema.  We will get a follow-up basic metabolic panel and magnesium in 2 weeks.  Hopefully will get echocardiogram done in the next couple of weeks to reassess LV function, diastolic function and valvular function. Continue losartan 25 mg p.o. daily, Toprol-XL 25 mg daily Imdur 30 mg p.o. daily   2.  CAD in native artery Denies any anginal or exertional symptoms.  History of prior NSTEMI management complicated by GI bleed and severe anemia.  Currently not on DOAC's due to hematuria.  Seeing Dr. Jeffie Pollock currently urology.  Continue to hold Eliquis.  Continue Imdur 30 mg daily.  Continue Crestor 5 mg daily.  3.  Ischemic cardiomyopathy Last echocardiogram showed severe hypokinesis of LV entire inferolateral wall.  EF 40 to 45%.  Mild asymmetric LVH.  Left ventricular end-diastolic parameters consistent with impaired relaxation.  RV systolic pressure moderately elevated with estimated pressure of 63.7 mmHg.  LA size moderately dilated, RA size moderately dilated, mitral valve myxomatous.  Mild dilatation of the aortic root measuring 40 mm.  As noted above in #1 we are repeating echocardiogram due to recent increased shortness of breath, lower extremity swelling, weight gain.  4.  Atrial fibrillation, unspecified type ([place]) Rate controlled today at 97.  Continue Toprol-XL 25 mg daily.  Continuing to  hold Eliquis for now until cystoscopy and follow-up with urology.  Currently denies any hematuria.   5.  Hypertension BP today 110/70 continue losartan 25 mg p.o. daily.  Medication Adjustments/Labs and Tests Ordered: Current medicines are reviewed at length with the patient today.  Concerns regarding medicines are outlined above.   Disposition: Follow-up with Dr. Harl Bowie or APP 6 months  Signed, Levell July, NP 02/08/2021 2:06 PM    [facility] [place], [place], [place] [address] Phone: [phone]; Fax: [phone]

## 2021-02-08 ENCOUNTER — Other Ambulatory Visit: Payer: Self-pay

## 2021-02-08 ENCOUNTER — Other Ambulatory Visit: Payer: Self-pay | Admitting: *Deleted

## 2021-02-08 ENCOUNTER — Encounter: Payer: Self-pay | Admitting: Family Medicine

## 2021-02-08 ENCOUNTER — Ambulatory Visit: Payer: Medicare Other | Admitting: Family Medicine

## 2021-02-08 VITALS — BP 110/70 | HR 58 | Ht 72.0 in | Wt 191.8 lb

## 2021-02-08 DIAGNOSIS — I5032 Chronic diastolic (congestive) heart failure: Secondary | ICD-10-CM | POA: Diagnosis not present

## 2021-02-08 DIAGNOSIS — R0602 Shortness of breath: Secondary | ICD-10-CM

## 2021-02-08 DIAGNOSIS — I10 Essential (primary) hypertension: Secondary | ICD-10-CM

## 2021-02-08 DIAGNOSIS — I4891 Unspecified atrial fibrillation: Secondary | ICD-10-CM | POA: Diagnosis not present

## 2021-02-08 DIAGNOSIS — I255 Ischemic cardiomyopathy: Secondary | ICD-10-CM | POA: Diagnosis not present

## 2021-02-08 DIAGNOSIS — I2510 Atherosclerotic heart disease of native coronary artery without angina pectoris: Secondary | ICD-10-CM

## 2021-02-08 DIAGNOSIS — Z79899 Other long term (current) drug therapy: Secondary | ICD-10-CM

## 2021-02-08 MED ORDER — FUROSEMIDE 40 MG PO TABS: 60.0000 mg | ORAL_TABLET | Freq: Every day | ORAL | 3 refills | Status: AC

## 2021-02-08 NOTE — Patient Instructions (Addendum)
Medication Instructions:  Your physician has recommended you make the following change in your medication:  Take furosemide 60 mg daily for 5 days, then reduce to 40 mg daily Continue other medications the same  Labwork: BMET & Mg in 2 weeks (02/22/2021) [facility]-no appointment needed Non-fasting  Testing/Procedures: Your physician has requested that you have an echocardiogram. Echocardiography is a painless test that uses sound waves to create images of your heart. It provides your doctor with information about the size and shape of your heart and how well your heart's chambers and valves are working. This procedure takes approximately one hour. There are no restrictions for this procedure.  Follow-Up: Your physician recommends that you schedule a follow-up appointment in: 2-3 months  Any Other Special Instructions Will Be Listed Below (If Applicable). Call office in 2 weeks with an update on symptoms and weights WEIGH DAILY, every am, wearing the same amount of clothing  Record weights, contact (provider name MD) for weight gain of 3 lbs in a day or 5 lbs in a week  Limit sodium to 500 mg per meal, total 2000 mg per day  Limit all liquids to 1.5-2 liters/quarts per day    If you need a refill on your cardiac medications before your next appointment, please call your pharmacy.

## 2021-02-14 ENCOUNTER — Other Ambulatory Visit: Payer: Medicare Other

## 2021-02-14 ENCOUNTER — Other Ambulatory Visit: Payer: Self-pay

## 2021-02-14 DIAGNOSIS — N1330 Unspecified hydronephrosis: Secondary | ICD-10-CM

## 2021-02-14 DIAGNOSIS — I5032 Chronic diastolic (congestive) heart failure: Secondary | ICD-10-CM

## 2021-02-14 DIAGNOSIS — R0602 Shortness of breath: Secondary | ICD-10-CM

## 2021-02-15 ENCOUNTER — Telehealth: Payer: Self-pay

## 2021-02-15 LAB — BASIC METABOLIC PANEL
BUN/Creatinine Ratio: 25 — ABNORMAL HIGH (ref 10–24)
BUN: 30 mg/dL — ABNORMAL HIGH (ref 8–27)
CO2: 25 mmol/L (ref 20–29)
Calcium: 10.3 mg/dL — ABNORMAL HIGH (ref 8.6–10.2)
Chloride: 96 mmol/L (ref 96–106)
Creatinine, Ser: 1.18 mg/dL (ref 0.76–1.27)
Glucose: 125 mg/dL — ABNORMAL HIGH (ref 70–99)
Potassium: 5.8 mmol/L — ABNORMAL HIGH (ref 3.5–5.2)
Sodium: 139 mmol/L (ref 134–144)
eGFR: 61 mL/min/{1.73_m2} (ref >59)

## 2021-02-15 NOTE — Telephone Encounter (Signed)
I called [facility] and spoke with referral coordinator ( only staff available) will forward results to MD in office today as patient PCP is out of office. Informed on last potassium level normal and new result shows elevation of 5.8

## 2021-02-15 NOTE — Telephone Encounter (Signed)
-----   Message from Irine Seal, MD sent at 02/15/2021 10:33 AM EDT ----- His potassium is elevated at 5.8.  He was in the ER on 10/25 with a cardiac issue and the potassium was normal then.   Please let his PCP at [facility] know of these results and see if they want to see him today or early next week unless they have other recommendation.

## 2021-02-22 ENCOUNTER — Telehealth: Payer: Self-pay | Admitting: Cardiology

## 2021-02-22 NOTE — Telephone Encounter (Signed)
Patient's wife called wondering if he needs to continue taking 1 1/2 pills of furosemide 40mg  or go back to his regular dosage

## 2021-02-22 NOTE — Telephone Encounter (Signed)
I told his wife to increase Lasix to 60 mg daily for the next 5 days then revert back to 40 mg daily thereafter.  I did tell her if patient gains 3 pounds in 24 hours or 5 pounds in a week he may take an extra 20 mg in addition to the 40 mg daily.  I asked her to record his weights and call us with an update in 2 weeks regarding weights, symptoms, edema.   Pt's wife notified and verbalized understanding.

## 2021-02-28 ENCOUNTER — Ambulatory Visit: Payer: Medicare Other | Admitting: Urology

## 2021-02-28 ENCOUNTER — Encounter: Payer: Self-pay | Admitting: Urology

## 2021-02-28 ENCOUNTER — Encounter (HOSPITAL_COMMUNITY): Payer: Self-pay | Admitting: *Deleted

## 2021-02-28 ENCOUNTER — Emergency Department (HOSPITAL_COMMUNITY): Payer: Medicare Other

## 2021-02-28 ENCOUNTER — Emergency Department (HOSPITAL_COMMUNITY)
Admission: EM | Admit: 2021-02-28 | Discharge: 2021-02-28 | Disposition: A | Discharge status: Home / Self Care | Payer: Medicare Other | Attending: Emergency Medicine | Admitting: Emergency Medicine

## 2021-02-28 ENCOUNTER — Other Ambulatory Visit: Payer: Self-pay

## 2021-02-28 VITALS — BP 94/54 | HR 129 | Temp 97.6°F | Wt 188.8 lb

## 2021-02-28 DIAGNOSIS — Z79899 Other long term (current) drug therapy: Secondary | ICD-10-CM | POA: Insufficient documentation

## 2021-02-28 DIAGNOSIS — N1330 Unspecified hydronephrosis: Secondary | ICD-10-CM

## 2021-02-28 DIAGNOSIS — Y92410 Unspecified street and highway as the place of occurrence of the external cause: Secondary | ICD-10-CM | POA: Insufficient documentation

## 2021-02-28 DIAGNOSIS — N401 Enlarged prostate with lower urinary tract symptoms: Secondary | ICD-10-CM

## 2021-02-28 DIAGNOSIS — S20211A Contusion of right front wall of thorax, initial encounter: Secondary | ICD-10-CM | POA: Diagnosis not present

## 2021-02-28 DIAGNOSIS — Z7901 Long term (current) use of anticoagulants: Secondary | ICD-10-CM | POA: Diagnosis not present

## 2021-02-28 DIAGNOSIS — R8271 Bacteriuria: Secondary | ICD-10-CM | POA: Diagnosis not present

## 2021-02-28 DIAGNOSIS — R339 Retention of urine, unspecified: Secondary | ICD-10-CM

## 2021-02-28 DIAGNOSIS — I5043 Acute on chronic combined systolic (congestive) and diastolic (congestive) heart failure: Secondary | ICD-10-CM | POA: Insufficient documentation

## 2021-02-28 DIAGNOSIS — N138 Other obstructive and reflux uropathy: Secondary | ICD-10-CM

## 2021-02-28 DIAGNOSIS — Z87891 Personal history of nicotine dependence: Secondary | ICD-10-CM | POA: Diagnosis not present

## 2021-02-28 DIAGNOSIS — I4892 Unspecified atrial flutter: Secondary | ICD-10-CM | POA: Diagnosis not present

## 2021-02-28 DIAGNOSIS — I110 Hypertensive heart disease with heart failure: Secondary | ICD-10-CM | POA: Insufficient documentation

## 2021-02-28 DIAGNOSIS — S299XXA Unspecified injury of thorax, initial encounter: Secondary | ICD-10-CM | POA: Diagnosis present

## 2021-02-28 LAB — BLADDER SCAN: Scan Result: 412

## 2021-02-28 MED ORDER — LORAZEPAM 2 MG/ML IJ SOLN: 1.0000 mg | Freq: Once | INTRAMUSCULAR | Status: DC

## 2021-02-28 MED ORDER — TAMSULOSIN HCL 0.4 MG PO CAPS: 0.4000 mg | ORAL_CAPSULE | Freq: Every day | ORAL | 3 refills | Status: DC

## 2021-02-28 MED ORDER — FINASTERIDE 5 MG PO TABS: 5.0000 mg | ORAL_TABLET | Freq: Every day | ORAL | 3 refills | Status: DC

## 2021-02-28 NOTE — Progress Notes (Signed)
Subjective: 1. BPH with urinary obstruction   2. Incomplete bladder emptying   3. Hydronephrosis, unspecified hydronephrosis type   4. Bacteriuria    02/28/21: Falon returns today in f/u for the history below.  He has had no futher hematuria but is off of the Eliquis about 4 months.  HIs PSA was 0.1 on 09/20/20.  His UA today has >30 WBC's and moderate bacteria.  He has no dysuria.  He has no frequency and only rare nocturia.   He has a history of bilateral hydro with bladder wall thickening from the BPH but his Cr. Was 1.18 on 02/14/21.  He remains on tamsulosin and finasteride.  PVR is down slightly at 462m.    09/20/20: Mr BSpataforereturns today in f/u for cystoscopy to complete the hematuria w/u.  He had a CT on 09/13/20 that showed bilateral hydroureteronephrosis to the bladder that wasn't present on a CT in 2020.  There is bladder wall thickening that was present previously.   The prostate looks slightly different with some lower density tissue at the bladder neck and with the obstruction, he could have a prostate neoplasm.   He has not had a PSA that he is aware of.   His Cr was 1.0 on 09/13/20.  He remains on finasteride but continues to hold the eliquis.   05/31/20: Mr. BArmstrongis an 86yo male who presents with a recent history of gross hematuria about 2 weeks ago.  He had 2-3 bloody voids and then he took and OTC kidney pill and it stopped.   He was having fairly severe RLQ pain that has resolved.  He had no nausea or vomiting.  He is voiding well.  He has a history of a renal cyst but no stones.  His UA today has 6-10 WBC, 11-30 RBC and a few bacteria.  He had a CT [facility] in 3/20 that showed right renal cysts, but no renal or ureteral stones, some prostate stones and an irregular bladder wall with some small diverticuli.   He is on finasteride for BPH.     ROS:  ROS  Allergies  Allergen Reactions   Atorvastatin     Severe leg pain/ache   Penicillins     Has patient had a PCN reaction causing  immediate rash, facial/tongue/throat swelling, SOB or lightheadedness with hypotension: Yes Has patient had a PCN reaction causing severe rash involving mucus membranes or skin necrosis: No Has patient had a PCN reaction that required hospitalization: No Has patient had a PCN reaction occurring within the last 10 years: No If all of the above answers are "NO", then may proceed with Cephalosporin use.    Past Medical History:  Diagnosis Date   [place]ute diastolic [place] ([place]) [place] 02/11[place]   Anemia    CHRONIC    Atrial flutter ([place]    a. diagnosed in 02/2017. Rate-control strategy pursued.    BPH (benign prostatic hyperplasia)    Chronic diastolic heart failure (HCC)    Dyspnea    Hypertension    NSTEMI (non-ST elevated myocardial infarction) ([facility]    Renal disorder    cyst on kidney    Past Surgical History:  Procedure Laterality Date   ABCESS DRAINAGE  11/2008   BUTTOCKS   BIOPSY  07/13/2018   Procedure: BIOPSY;  Surgeon: BOtis Brace MD;  [place]  Service: Gastroenterology;;   CATARACT EXTRACTION, BILATERAL     COLONOSCOPY N/A 04/02/2017   Procedure: COLONOSCOPY;  Surgeon: RHildred Laser  U, MD;  [facility];  Service: Endoscopy;  Laterality: N/A;  10:55   COLONOSCOPY WITH PROPOFOL N/A 07/13/2018   Procedure: COLONOSCOPY WITH PROPOFOL;  Surgeon: Otis Brace, MD;  Location: [place];  Service: Gastroenterology;  Laterality: N/A;   ESOPHAGOGASTRODUODENOSCOPY (EGD) WITH PROPOFOL N/A 07/09/2018   Procedure: ESOPHAGOGASTRODUODENOSCOPY (EGD) WITH PROPOFOL;  Surgeon: Wilford Corner, MD;  Location: [place];  Service: Endoscopy;  Laterality: N/A;   GIVENS CAPSULE STUDY N/A 08/06/2018   Procedure: GIVENS CAPSULE STUDY;  Surgeon: Ronald Lobo, MD;  Location: [place];  Service: Endoscopy;  Laterality: N/A;   LEFT HEART CATH AND CORONARY ANGIOGRAPHY N/A 07/14/2018   Procedure: LEFT HEART CATH AND CORONARY ANGIOGRAPHY;   Surgeon: Belva Crome, MD;  Location: [place] [facility];  Service: Cardiovascular;  Laterality: N/A;   POLYPECTOMY  04/02/2017   Procedure: POLYPECTOMY;  Surgeon: Rogene Houston, MD;  [facility];  Service: Endoscopy;;  asceding colon(hot snare)/ sigmoid colon times two (cold snare)   POLYPECTOMY  07/13/2018   Procedure: POLYPECTOMY;  Surgeon: Otis Brace, MD;  [facility];  Service: Gastroenterology;;    Social History   Socioeconomic History   Marital status: Married    Spouse name: Not on file   Number of children: Not on file   Years of education: Not on file   Highest education level: Not on file  Occupational History   Not on file  Tobacco Use   Smoking status: Former    Types: Cigarettes    Quit date: 2006    Years since quitting: 16.8   Smokeless tobacco: Never  Vaping Use   Vaping Use: Never used  Substance and Sexual Activity   Alcohol use: No   Drug use: No   Sexual activity: Not Currently  Other Topics Concern   Not on file  Social History Narrative   Not on file   Social Determinants of Health   Financial Resource Strain: Not on file  Food Insecurity: Not on file  Transportation Needs: Not on file  Physical Activity: Not on file  Stress: Not on file  Social Connections: Not on file  Intimate Partner Violence: Not on file    Family History  Problem Relation Age of Onset   CVA Father    CAD Father    Diabetes Sister    Colon cancer Neg Hx     Anti-infectives: Anti-infectives (From admission, onward)    None       Current Outpatient Medications  Medication Sig Dispense Refill   alum & mag hydroxide-simeth (MAALOX/MYLANTA) 200-200-20 MG/5ML suspension Take 30 mLs by mouth every 4 (four) hours as needed for indigestion or heartburn. 355 mL 0   ELIQUIS 5 MG TABS tablet TAKE 1 TABLET BY MOUTH  TWICE DAILY 180 tablet 3   furosemide (LASIX) 40 MG tablet Take 1.5 tablets (60 mg total) by mouth daily. For 5 days, then  reduce to 40 mg daily 90 tablet 3   isosorbide mononitrate (IMDUR) 30 MG 24 hr tablet Take 1 tablet (30 mg total) by mouth at bedtime. 90 tablet 3   losartan (COZAAR) 25 MG tablet Take 25 mg by mouth daily.     metoprolol succinate (TOPROL XL) 25 MG 24 hr tablet Take 2 tablets (50 mg total) by mouth daily. 180 tablet 1   pantoprazole (PROTONIX) 40 MG tablet Take 40 mg by mouth 2 (two) times daily.     potassium chloride SA (KLOR-CON) 20 MEQ tablet Take 20  mEq by mouth daily.     rosuvastatin (CRESTOR) 5 MG tablet Take 5 mg by mouth daily.     finasteride (PROSCAR) 5 MG tablet Take 1 tablet (5 mg total) by mouth daily. 90 tablet 3   tamsulosin (FLOMAX) 0.4 MG CAPS capsule Take 1 capsule (0.4 mg total) by mouth daily. 90 capsule 3   No current facility-administered medications for this visit.     Objective: Vital signs in last 24 hours: BP (!) 94/54   Pulse (!) 129   Temp 97.6 F (36.4 C)   Wt 188 lb 12.8 oz (85.6 kg)   BMI 25.61 kg/m   Intake/Output from previous day: No intake/output data recorded. Intake/Output this shift: _0 @   Physical Exam  Lab Results:  UA reviewed.  >30 WBC and mod bacteria..  No results found for this or any previous visit (from the past 24 hour(s)).   Recent Results (from the past 2160 hour(s))  Basic metabolic panel     Status: Abnormal   Collection Time: 02/05/21  2:16 PM  Result Value Ref Range   Sodium 138 135 - 145 mmol/L   Potassium 4.0 3.5 - 5.1 mmol/L   Chloride 99 98 - 111 mmol/L   CO2 30 22 - 32 mmol/L   Glucose, Bld 170 (H) 70 - 99 mg/dL    Comment: Glucose reference range applies only to samples taken after fasting for at least 8 hours.   BUN 38 (H) 8 - 23 mg/dL   Creatinine, Ser 1.22 0.61 - 1.24 mg/dL   Calcium 9.3 8.9 - 10.3 mg/dL   GFR, Estimated 58 (L) >60 mL/min    Comment: (NOTE) Calculated using the CKD-EPI Creatinine Equation (2021)    Anion gap 9 5 - 15    Comment: Performed at [facility], [address], [place], [place] [address]  CBC     Status: Abnormal   Collection Time: 02/05/21  2:16 PM  Result Value Ref Range   WBC 5.3 4.0 - 10.5 K/uL   RBC 3.58 (L) 4.22 - 5.81 MIL/uL   Hemoglobin 11.4 (L) 13.0 - 17.0 g/dL   HCT 35.9 (L) 39.0 - 52.0 %   MCV 100.3 (H) 80.0 - 100.0 fL   MCH 31.8 26.0 - 34.0 pg   MCHC 31.8 30.0 - 36.0 g/dL   RDW 16.9 (H) 11.5 - 15.5 %   Platelets 141 (L) 150 - 400 K/uL   nRBC 0.0 0.0 - 0.2 %    Comment: Performed at [facility], [address], [place], [place] [address]  Troponin I (High Sensitivity)     Status: Abnormal   Collection Time: 02/05/21  2:16 PM  Result Value Ref Range   Troponin I (High Sensitivity) 23 (H) <18 ng/L    Comment: (NOTE) Elevated high sensitivity troponin I (hsTnI) values and significant  changes across serial measurements may suggest ACS but many other  chronic and acute conditions are known to elevate hsTnI results.  Refer to the "Links" section for chest pain algorithms and additional  guidance. Performed at [facility], [address], [place], [place] [address]   Troponin I (High Sensitivity)     Status: Abnormal   Collection Time: 02/05/21  3:54 PM  Result Value Ref Range   Troponin I (High Sensitivity) 23 (H) <18 ng/L    Comment: (NOTE) Elevated high sensitivity troponin I (hsTnI) values and significant  changes across serial measurements may suggest ACS but many other  chronic and acute conditions  are known to elevate hsTnI results.  Refer to the "Links" section for chest pain algorithms and additional  guidance. Performed at [facility], [address], [place], [place] [address]   Brain natriuretic peptide     Status: Abnormal   Collection Time: 02/05/21  3:54 PM  Result Value Ref Range   B Natriuretic Peptide 591.0 (H) 0.0 - 100.0 pg/mL    Comment: Performed at [facility], [address], [place], [place] [address]  Basic metabolic panel     Status: Abnormal   Collection Time: 02/14/21  1:57 PM  Result Value Ref  Range   Glucose 125 (H) 70 - 99 mg/dL   BUN 30 (H) 8 - 27 mg/dL   Creatinine, Ser 1.18 0.76 - 1.27 mg/dL   eGFR 61 >59 mL/min/1.73   BUN/Creatinine Ratio 25 (H) 10 - 24   Sodium 139 134 - 144 mmol/L   Potassium 5.8 (H) 3.5 - 5.2 mmol/L   Chloride 96 96 - 106 mmol/L   CO2 25 20 - 29 mmol/L   Calcium 10.3 (H) 8.6 - 10.2 mg/dL  Bladder scan     Status: None   Collection Time: 02/28/21 10:34 AM  Result Value Ref Range   Scan Result 412     Studies/Results:   Assessment/Plan: Gross hematuria.  He has had no recurrence since stopping Eliquis.  His UA looks infected today.  He had inflammatory changes on his cystoscopy and doesn't want to repeat that.   I will culture the urine today.   Cytology was negative.   Bilateral hydronephrosis that appears to be secondary to chronic bladder outlet obstruction with probable reflux vs bladder wall thickening.  His PVR is better on tamsulosin and finasteride and his recent Cr was normal.  He had a very low PSA.     Meds ordered this encounter  Medications   finasteride (PROSCAR) 5 MG tablet    Sig: Take 1 tablet (5 mg total) by mouth daily.    Dispense:  90 tablet    Refill:  3   tamsulosin (FLOMAX) 0.4 MG CAPS capsule    Sig: Take 1 capsule (0.4 mg total) by mouth daily.    Dispense:  90 capsule    Refill:  3      Orders Placed This Encounter  Procedures   Urine Culture   Bladder scan      Return in about 1 year (around 02/28/2022) for with PVR.    CC: [facility].      Irine Seal 02/28/2021 [phone] ID: [name], male   DOB: April 14, 1935, 86 y.o.   MRN: [id number]
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
## 2021-02-28 NOTE — Progress Notes (Signed)
Urological Symptom Review  PVR 412  Patient is experiencing the following symptoms: N/a   Review of Systems  Gastrointestinal (upper)  : Negative for upper GI symptoms  Gastrointestinal (lower) : Negative for lower GI symptoms  Constitutional : Negative for symptoms  Skin: Negative for skin symptoms  Eyes: Negative for eye symptoms  Ear/Nose/Throat : Negative for Ear/Nose/Throat symptoms  Hematologic/Lymphatic: Negative for Hematologic/Lymphatic symptoms  Cardiovascular : Negative for cardiovascular symptoms  Respiratory : Shortness of breath  Endocrine: Negative for endocrine symptoms  Musculoskeletal: Negative for musculoskeletal symptoms  Neurological: Negative for neurological symptoms  Psychologic: Negative for psychiatric symptoms

## 2021-02-28 NOTE — Discharge Instructions (Addendum)
Return if any problems.

## 2021-02-28 NOTE — ED Notes (Signed)
Dc instructions reviewed with pt no questions or concerns at this time. Will follow up as needed.  

## 2021-03-01 LAB — URINALYSIS, ROUTINE W REFLEX MICROSCOPIC
Bilirubin, UA: NEGATIVE
Glucose, UA: NEGATIVE
Ketones, UA: NEGATIVE
Nitrite, UA: NEGATIVE
Specific Gravity, UA: 1.015 (ref 1.005–1.030)
Urobilinogen, Ur: 1 mg/dL (ref 0.2–1.0)
pH, UA: 7 (ref 5.0–7.5)

## 2021-03-01 LAB — MICROSCOPIC EXAMINATION
Renal Epithel, UA: NONE SEEN /hpf
WBC, UA: >30 /hpf — AB (ref 0–5)

## 2021-03-01 NOTE — Addendum Note (Signed)
Addended by: Tyrone Apple on: 03/01/2021 11:36 AM   Modules accepted: Orders

## 2021-03-05 ENCOUNTER — Telehealth: Payer: Self-pay

## 2021-03-05 DIAGNOSIS — R339 Retention of urine, unspecified: Secondary | ICD-10-CM

## 2021-03-05 LAB — URINE CULTURE

## 2021-03-05 MED ORDER — NITROFURANTOIN MONOHYD MACRO 100 MG PO CAPS: 100.0000 mg | ORAL_CAPSULE | Freq: Two times a day (BID) | ORAL | 0 refills | Status: AC

## 2021-03-05 NOTE — Telephone Encounter (Signed)
Patient called this am to inform of new prescription sent to pharmacy. Spoke with wife, Dub Mikes. Voiced understanding.   Rx sent to [facility].

## 2021-03-05 NOTE — Telephone Encounter (Signed)
-----   Message from Irine Seal, MD sent at 03/05/2021  8:52 AM EST ----- He needs Macrobid 100mg  po bid #28.    ----- Message ----- From: Dorisann Frames, RN Sent: 03/05/2021   8:46 AM EST To: Irine Seal, MD  No treatment started

## 2021-03-08 ENCOUNTER — Encounter (HOSPITAL_COMMUNITY): Payer: Self-pay | Admitting: Radiology

## 2021-03-26 ENCOUNTER — Encounter (HOSPITAL_COMMUNITY): Payer: Self-pay

## 2021-03-26 ENCOUNTER — Ambulatory Visit (HOSPITAL_COMMUNITY)
Admission: RE | Admit: 2021-03-26 | Discharge: 2021-03-26 | Disposition: A | Discharge status: Home / Self Care | Payer: Medicare Other | Source: Ambulatory Visit | Attending: Family Medicine | Admitting: Family Medicine

## 2021-03-26 ENCOUNTER — Telehealth: Payer: Self-pay | Admitting: Cardiology

## 2021-03-26 NOTE — Telephone Encounter (Signed)
Received phone call from Mr. White sonographer at [facility] regarding patient presenting for echocardiogram today and noted to be in atrial fibrillation with RVR.  He is a patient of Dr. Harl Bowie, most recently seen by Mr. Leonides Sake NP, I reviewed the notes and recent recorded vital signs.  It looks like he is probably having episodes of paroxysmal atrial fibrillation by recorded vital signs.  Echocardiogram was canceled for today pending better control of his rhythm, patient not reportedly symptomatic and blood pressure stable.  Will forward to Dr. Harl Bowie and nursing to see if an office visit can be set up for further evaluation in the meanwhile.

## 2021-03-26 NOTE — Progress Notes (Signed)
Patient in for echocardiogram. Echo started but heart rate 100-167 bpm. Spoke with Dr. Domenic Polite about patient. He said to not continue echo and to tell patient if he got more short of breath or chest tightness or dizzy to go to the Emergency Department. I told the patient and his sister in-law this.  Alvino Chapel, RCS

## 2021-03-27 ENCOUNTER — Telehealth: Payer: Self-pay

## 2021-03-27 MED ORDER — AMIODARONE HCL 200 MG PO TABS: ORAL_TABLET | ORAL | 0 refills | Status: DC

## 2021-03-27 MED ORDER — AMIODARONE HCL 200 MG PO TABS
200.0000 mg | ORAL_TABLET | Freq: Every day | ORAL | 3 refills | Status: AC
Start: 2021-03-27 — End: ?

## 2021-03-27 NOTE — Telephone Encounter (Signed)
Spoke to pt's wife, who verbalized understanding of medication instructions. Nurse visit for EKG/Vitals scheduled for 12/28 in [place] office per pt request. Appt with Dr. Harl Bowie 04/22/21.

## 2021-03-27 NOTE — Telephone Encounter (Signed)
-----   Message from Arnoldo Lenis, MD sent at 03/27/2021  1:42 PM EST ----- Notified about issued with afib and high heart rates during echo. Given his prior issues with low heart rates when in sinus rhythm as well as his history of CAD and heart failure I think our best option is to start amiodarone. Can he start amiodarone 400mg  bid x 1 week, then 200mg  bid x 2 weeks, then 200mg  daily. Needs nursing visit with EKG and vitals in 2 weeks. Is there a PA appt in 3-4 weeks?  Zandra Abts MD

## 2021-04-01 ENCOUNTER — Other Ambulatory Visit: Payer: Self-pay | Admitting: Cardiology

## 2021-04-10 ENCOUNTER — Ambulatory Visit: Payer: Medicare Other

## 2021-04-22 ENCOUNTER — Encounter: Payer: Self-pay | Admitting: Cardiology

## 2021-04-22 ENCOUNTER — Ambulatory Visit: Payer: Medicare Other | Admitting: Cardiology

## 2021-04-22 VITALS — BP 100/60 | HR 68 | Ht 72.0 in | Wt 184.2 lb

## 2021-04-22 DIAGNOSIS — I255 Ischemic cardiomyopathy: Secondary | ICD-10-CM | POA: Diagnosis not present

## 2021-04-22 DIAGNOSIS — I4891 Unspecified atrial fibrillation: Secondary | ICD-10-CM | POA: Diagnosis not present

## 2021-04-22 MED ORDER — APIXABAN 5 MG PO TABS: 5.0000 mg | ORAL_TABLET | Freq: Two times a day (BID) | ORAL | 3 refills | Status: DC

## 2021-04-22 NOTE — Patient Instructions (Addendum)
Medication Instructions:  Restart Eliquis 5mg  twice a day  Continue all other medications.     Labwork: none  Testing/Procedures: none  Follow-Up: 3 months   Any Other Special Instructions Will Be Listed Below (If Applicable). Nurse visit in 1 month for EKG and vitals.   If you need a refill on your cardiac medications before your next appointment, please call your pharmacy.

## 2021-04-22 NOTE — Progress Notes (Signed)
Clinical Summary Mr. Deruiter is a 87 y.o.male seen today for follow up of the following medical problems.   1. CAD/ICM - abnormal stress test 2018 intermediate risk managed medcally  - admitted 06/2018 with epgiastric and chest pain, somewhat atypical chest pain symptoms - significant EKG changes, aVR ST elevation and diffuse ST depression suggesting LM or multivessel disease. Eventual severe troponin elevation peak 60 - management complicated by severe anemia, Hgb 5.5     - eventual cath 07/2018 showed ostial LAD 70%, OM2 100%, , OM3 100%, RCA 85%. Cultprit thought to be occluded OM2 and OM3. OM3 has colalterals from LAD. Managed medically due to GI bleed - 06/2018 echo LVEF 83-41%, grade I diastolic dysfunction, PASP 64. Aortic root 40 mm - LVEF in 03/2018 was 55-60%       - admitted again late 07/2018 with recurrent anemia and elevated troponin with chest pain -  plavix and xarelto were held due to GI bleed at that time      - chronic SOB/DOE. No LE edema.      2. Atrial arrythmias history of atrial arrythmias including afib/aflutter/MAT - had been off anticoag due to prior GI bleed   - we did start eliquis at last visit - did well for 6 months, no recurrent GI bleeds but recent severe hematuria and had to stop eliquis.     - recent issues with afib with RVR - was to have echo 03/26/21 but postponed due elevated HR - prior issues with low hrs in SR, we decided to start him on amiodarone.  - has not been taking eliquis.   3. Anemia/GI bleed - EGD 03/27 showed mild gastritis, duodenitis and mild esophagitis. - Colonoscopy showed few colon polyps which was removed with cold snare and biopsy forcep, medium sized lipoma at hepatic  flexure, diverticulosis and large internal hemorrhoids.   - admitted again late 07/2018 with anemia. Hgb 5.2, received 4 unirt pRBCs.  capsule endoscopy shows erythematous nonerosive gastritis and a strand of adherent blood in the proximal duodenum  within the area examined at the last EGD. GI recommends strict avoidance of Alka-Seltzer and other ulcerogenic medications and increase PPI to twice daily indefinitely, add sucralfate for 2 weeks and continue as recommended.   - no recent issues       3. COPD - abnoraml PFTs 12/2017, we referred to Dr Luan Pulling but he never scheduled appt - trying symbicort by pcp, ran out and did not obtain more       4.Hematuria - followed by urology - CT pending - had to stop eliquis, has not restarted.  - from last urology note cystoscopy showed inflammation, he was treated for UTI   5. Hyperlipidemia - 10/2019 TC 117 TG 84 HDL 40 LDL 60  Past Medical History:  Diagnosis Date   Acute diastolic CHF (congestive heart failure) ([place]) 02/11/2017   Anemia    CHRONIC    Atrial flutter ([place])    a. diagnosed in 02/2017. Rate-control strategy pursued.    BPH (benign prostatic hyperplasia)    Chronic diastolic heart failure (H[facility])    Dyspnea    Hypertension    NSTEMI (non-ST elevated myocardial infarction) ([place])    Renal disorder    cyst on kidney     Allergies  Allergen Reactions   Atorvastatin     Severe leg pain/ache   Penicillins     Has patient had a PCN reaction causing immediate rash, facial/tongue/throat swelling, SOB or lightheadedness  with hypotension: Yes Has patient had a PCN reaction causing severe rash involving mucus membranes or skin necrosis: No Has patient had a PCN reaction that required hospitalization: No Has patient had a PCN reaction occurring within the last 10 years: No If all of the above answers are "NO", then may proceed with Cephalosporin use.     Current Outpatient Medications  Medication Sig Dispense Refill   alum & mag hydroxide-simeth (MAALOX/MYLANTA) 200-200-20 MG/5ML suspension Take 30 mLs by mouth every 4 (four) hours as needed for indigestion or heartburn. 355 mL 0   amiodarone (PACERONE) 200 MG tablet Take 400 mg twice a day for 1 week, THEN, take 200  mg twice a day for 2 weeks 56 tablet 0   amiodarone (PACERONE) 200 MG tablet Take 1 tablet (200 mg total) by mouth daily. 90 tablet 3   ELIQUIS 5 MG TABS tablet TAKE 1 TABLET BY MOUTH  TWICE DAILY 180 tablet 3   finasteride (PROSCAR) 5 MG tablet Take 1 tablet (5 mg total) by mouth daily. 90 tablet 3   furosemide (LASIX) 40 MG tablet Take 1.5 tablets (60 mg total) by mouth daily. For 5 days, then reduce to 40 mg daily 90 tablet 3   isosorbide mononitrate (IMDUR) 30 MG 24 hr tablet Take 1 tablet (30 mg total) by mouth at bedtime. 90 tablet 3   losartan (COZAAR) 25 MG tablet Take 25 mg by mouth daily.     metoprolol succinate (TOPROL-XL) 25 MG 24 hr tablet TAKE 2 TABLETS BY MOUTH  DAILY 180 tablet 3   nitrofurantoin, macrocrystal-monohydrate, (MACROBID) 100 MG capsule Take 1 capsule (100 mg total) by mouth every 12 (twelve) hours. 28 capsule 0   pantoprazole (PROTONIX) 40 MG tablet Take 40 mg by mouth 2 (two) times daily.     potassium chloride SA (KLOR-CON) 20 MEQ tablet Take 20 mEq by mouth daily.     rosuvastatin (CRESTOR) 5 MG tablet Take 5 mg by mouth daily.     tamsulosin (FLOMAX) 0.4 MG CAPS capsule Take 1 capsule (0.4 mg total) by mouth daily. 90 capsule 3   No current facility-administered medications for this visit.     Past Surgical History:  Procedure Laterality Date   ABCESS DRAINAGE  11/2008   BUTTOCKS   BIOPSY  07/13/2018   Procedure: BIOPSY;  Surgeon: Otis Brace, MD;  Location: [facility];  Service: Gastroenterology;;   CATARACT EXTRACTION, BILATERAL     COLONOSCOPY N/A 04/02/2017   Procedure: COLONOSCOPY;  Surgeon: Rogene Houston, MD;  [facility];  Service: Endoscopy;  Laterality: N/A;  10:55   COLONOSCOPY WITH PROPOFOL N/A 07/13/2018   Procedure: COLONOSCOPY WITH PROPOFOL;  Surgeon: Otis Brace, MD;  Location: [facility];  Service: Gastroenterology;  Laterality: N/A;   ESOPHAGOGASTRODUODENOSCOPY (EGD) WITH PROPOFOL N/A 07/09/2018   Procedure:  ESOPHAGOGASTRODUODENOSCOPY (EGD) WITH PROPOFOL;  Surgeon: Wilford Corner, MD;  Location: [facility];  Service: Endoscopy;  Laterality: N/A;   GIVENS CAPSULE STUDY N/A 08/06/2018   Procedure: GIVENS CAPSULE STUDY;  Surgeon: Ronald Lobo, MD;  Location: [facility];  Service: Endoscopy;  Laterality: N/A;   LEFT HEART CATH AND CORONARY ANGIOGRAPHY N/A 07/14/2018   Procedure: LEFT HEART CATH AND CORONARY ANGIOGRAPHY;  Surgeon: Belva Crome, MD;  Location: [facility];  Service: Cardiovascular;  Laterality: N/A;   POLYPECTOMY  04/02/2017   Procedure: POLYPECTOMY;  Surgeon: Rogene Houston, MD;  [facility];  Service: Endoscopy;;  asceding colon(hot snare)/ sigmoid colon times  two (cold snare)   POLYPECTOMY  07/13/2018   Procedure: POLYPECTOMY;  Surgeon: Otis Brace, MD;  [facility];  Service: Gastroenterology;;     Allergies  Allergen Reactions   Atorvastatin     Severe leg pain/ache   Penicillins     Has patient had a PCN reaction causing immediate rash, facial/tongue/throat swelling, SOB or lightheadedness with hypotension: Yes Has patient had a PCN reaction causing severe rash involving mucus membranes or skin necrosis: No Has patient had a PCN reaction that required hospitalization: No Has patient had a PCN reaction occurring within the last 10 years: No If all of the above answers are "NO", then may proceed with Cephalosporin use.      Family History  Problem Relation Age of Onset   CVA Father    CAD Father    Diabetes Sister    Colon cancer Neg Hx      Social History Mr. Dorko reports that he quit smoking about 17 years ago. His smoking use included cigarettes. He has never used smokeless tobacco. Mr. Enke reports no history of alcohol use.   Review of Systems CONSTITUTIONAL: No weight loss, fever, chills, weakness or fatigue.  HEENT: Eyes: No visual loss, blurred vision, double vision or yellow sclerae.No hearing loss,  sneezing, congestion, runny nose or sore throat.  SKIN: No rash or itching.  CARDIOVASCULAR: per hpi RESPIRATORY: No shortness of breath, cough or sputum.  GASTROINTESTINAL: No anorexia, nausea, vomiting or diarrhea. No abdominal pain or blood.  GENITOURINARY: No burning on urination, no polyuria NEUROLOGICAL: No headache, dizziness, syncope, paralysis, ataxia, numbness or tingling in the extremities. No change in bowel or bladder control.  MUSCULOSKELETAL: No muscle, back pain, joint pain or stiffness.  LYMPHATICS: No enlarged nodes. No history of splenectomy.  PSYCHIATRIC: No history of depression or anxiety.  ENDOCRINOLOGIC: No reports of sweating, cold or heat intolerance. No polyuria or polydipsia.  Marland Kitchen   Physical Examination Today's Vitals   04/22/21 1248  BP: 100/60  Pulse: 68  Weight: 184 lb 3.2 oz (83.6 kg)  Height: 6' (1.829 m)   Body mass index is 24.98 kg/m.  Gen: resting comfortably, no acute distress HEENT: no scleral icterus, pupils equal round and reactive, no palptable cervical adenopathy,  CV: irreg, tachy, no m/r/g no jvd Resp: Clear to auscultation bilaterally GI: abdomen is soft, non-tender, non-distended, normal bowel sounds, no hepatosplenomegaly MSK: extremities are warm, no edema.  Skin: warm, no rash Neuro:  no focal deficits Psych: appropriate affect   Diagnostic Studies  06/2018 echo IMPRESSIONS      1. Severe hypokinesis of the left ventricular, entire inferolateral wall.  2. The left ventricle has mild-moderately reduced systolic function, with an ejection fraction of 40-45%. The cavity size was normal. There is mild asymmetric left ventricular hypertrophy. Left ventricular diastolic Doppler parameters are consistent  with impaired relaxation.  3. The right ventricle has normal systolc function. The cavity was normal. There is no increase in right ventricular wall thickness. Right ventricular systolic pressure is moderately elevated with an  estimated pressure of 63.7 mmHg.  4. Left atrial size was moderately dilated.  5. Right atrial size was moderately dilated.  6. The mitral valve is myxomatous. Mild thickening of the mitral valve leaflet.  7. The aortic valve is tricuspid Mild thickening of the aortic valve Mild calcification of the aortic valve. Aortic valve regurgitation is trivial by color flow Doppler.  8. There is mild dilatation of the aortic root measuring 40 mm.  9. The inferior vena cava was dilated in size with >50% respiratory variability. 10. No intracardiac thrombi or masses were visualized.     07/2018 cath Significant three-vessel coronary artery disease. Segmental 70% proximal to mid LAD.  This is an intermediate stenosis but would likely have positive FFR if tested. Total occlusion of the second and third obtuse marginal branches, the sources of the patient's recent non-ST elevation myocardial infarction.  The LAD supplies collaterals to the third obtuse marginal which is a large of the 3 obtuse marginals. 85% proximal RCA.  Right dominant coronary anatomy. No aortic valve gradient Mildly elevated LVEDP   RECOMMENDATIONS:   Combination antiplatelet and antithrombotic therapy will place the patient at increased risk for recurrent bleeding. Requires combination therapy because of paroxysmal atrial flutter and significant underlying ischemic heart disease.  Would not recommend either until hemoglobin is stable and above 9 for at least 2 weeks.  Would avoid aspirin.  Perhaps low-dose rivaroxaban 2.5 mg twice daily and Plavix 75 mg/day would be his best option (based on the [facility] trial).  [facility] used aspirin, but this patient's endoscopy has suggested aspirin will be worse than Plavix as far as bleeding risk is concerned.  Low-dose rivaroxaban and Plavix has never been started as a stroke preventative measure.  No good data in this particular clinical subset. Anti-ischemic therapy with beta-blocker and long-acting  nitrates as tolerated by blood pressure. Aggressive secondary risk factor modification including high intensity statin therapy. Will need clinical cardiology and gastroenterology follow-up to coordinate care.  Cardiology follow-up should be within the next 4 weeks. Hemoglobin in 2 and 4 weeks.     Assessment and Plan   CAD/ICM -prior NSTEMI as reported above, management complicated by GI bleed and severe anemia. He had a cath as reported above but disease managed medically due to antiplatelet limitation from GI bleed   - no recent chest paisn, appears euvoelmic      2. Afib - previously not on anticoag due to GI bleed - we previoulsy started eliquis, did well for 6 months but  had severe hematuria and eliquis on hold - followed by urology, hematuria has resolved. Retry eliquis 5mg  bid  - started on amio for persistent afib with RVR. Limitation on av nodal agents due to brady when in SR and also soft bp's.  - remains in afib with RVR. If able to tolerate eliquis would consider DCCV in the next few weeks.         Arnoldo Lenis, M.D.

## 2021-05-14 ENCOUNTER — Telehealth: Payer: Self-pay | Admitting: Cardiology

## 2021-05-14 NOTE — Telephone Encounter (Signed)
°*  STAT* If patient is at the pharmacy, call can be transferred to refill team.   1. Which medications need to be refilled? (please list name of each medication and dose if known) amiodarone (PACERONE) 200 MG tablet  2. Which pharmacy/location (including street and city if local pharmacy) is medication to be sent to?[facility] - [place], [place] - [place]  3. Do they need a 30 day or 90 day supply? 90 ds   Pt c/o medication issue:  1. Name of Medication: apixaban (ELIQUIS) 5 MG TABS tablet  2. How are you currently taking this medication (dosage and times per day)? Take 1 tablet (5 mg total) by mouth 2 (two) times daily.  3. Are you having a reaction (difficulty breathing--STAT)? yes  4. What is your medication issue? pt started back bleeding.. would like to know if he can come off of the eliquis

## 2021-05-15 NOTE — Telephone Encounter (Signed)
Reports bleeding from rectum daily with bowel movement and unable to determine amount  Advised to hold eliquis for 2 days, then resume at previous dose Advised to contact PCP regarding his rectal bleeding  Advised that amiodarone refills were sent to [facility] on 03/27/2021 with confirmed receipt Advised to contact [facility] for refill  Reports swelling in feet started 1 week ago that occurs upon waking up. Currently taking 40 mg lasix daily. Advised to take 60 mg daily for 2 days, then resume 40 mg daily  Verbalized understanding of plan

## 2021-05-16 NOTE — Telephone Encounter (Signed)
Patient's wife informed and verbalized understanding of plan. 

## 2021-05-16 NOTE — Addendum Note (Signed)
Addended by: Merlene Laughter on: 05/16/2021 12:05 PM   Modules accepted: Orders

## 2021-05-16 NOTE — Telephone Encounter (Signed)
Has had prior GI bleeding on eliquis, im not sure he is going to tolerate. If recurrent bleeding would need to stop all together again. I do recommend he follow up with his GI and pcp about the bleeding  Zandra Abts MD

## 2021-05-23 ENCOUNTER — Ambulatory Visit (INDEPENDENT_AMBULATORY_CARE_PROVIDER_SITE_OTHER): Payer: Medicare Other | Admitting: *Deleted

## 2021-05-23 VITALS — BP 92/50 | HR 83 | Wt 182.0 lb

## 2021-05-23 DIAGNOSIS — I4891 Unspecified atrial fibrillation: Secondary | ICD-10-CM

## 2021-05-23 DIAGNOSIS — I480 Paroxysmal atrial fibrillation: Secondary | ICD-10-CM | POA: Diagnosis not present

## 2021-05-23 NOTE — Progress Notes (Signed)
Patient in office for EKG & vitals.  States he continues to feel about the same.  Did c/o weakness / tired feeling in his legs - this is not new.  Medications the same except Eliquis - states they have not been able to resume this.    EKG scanned into epic.

## 2021-05-24 NOTE — Progress Notes (Signed)
Michael Stephens (wife) notified & verbalized understanding.

## 2021-05-24 NOTE — Progress Notes (Signed)
Remains in afib but heart rates are better controlled. Since not able to be on eliquis would not be able to do a cardioversion to get his heart rhythm back to normal, would continue current meds to control heart rates at this time   J Jordie Schreur MD

## 2021-07-23 ENCOUNTER — Ambulatory Visit: Payer: Medicare Other | Admitting: Cardiology

## 2021-07-23 ENCOUNTER — Encounter: Payer: Self-pay | Admitting: Cardiology

## 2021-07-23 ENCOUNTER — Encounter: Payer: Self-pay | Admitting: *Deleted

## 2021-07-23 VITALS — BP 104/58 | HR 45 | Ht 72.0 in | Wt 183.4 lb

## 2021-07-23 DIAGNOSIS — I4891 Unspecified atrial fibrillation: Secondary | ICD-10-CM | POA: Diagnosis not present

## 2021-07-23 DIAGNOSIS — I255 Ischemic cardiomyopathy: Secondary | ICD-10-CM | POA: Diagnosis not present

## 2021-07-23 DIAGNOSIS — I5022 Chronic systolic (congestive) heart failure: Secondary | ICD-10-CM | POA: Diagnosis not present

## 2021-07-23 DIAGNOSIS — I2510 Atherosclerotic heart disease of native coronary artery without angina pectoris: Secondary | ICD-10-CM | POA: Diagnosis not present

## 2021-07-23 NOTE — Progress Notes (Signed)
? ? ? ?Clinical Summary ?Michael Stephens is a 87 y.o.male seen today for follow up of the following medical problems.  ?  ?1. CAD/ICM ?- abnormal stress test 2018 intermediate risk managed medcally  ?- admitted 06/2018 with epgiastric and chest pain, somewhat atypical chest pain symptoms ?- significant EKG changes, aVR ST elevation and diffuse ST depression suggesting LM or multivessel disease. Eventual severe troponin elevation peak 60 ?- management complicated by severe anemia, Hgb 5.5 ?  ?  ?- eventual cath 07/2018 showed ostial LAD 70%, OM2 100%, , OM3 100%, RCA 85%. Cultprit thought to be occluded OM2 and OM3. OM3 has colalterals from LAD. Managed medically due to GI bleed ?- LVEF in 03/2018 was 55-60% ?- 06/2018 echo LVEF 28-20%, grade I diastolic dysfunction, PASP 64. Aortic root 40 mm ?- admitted again late 07/2018 with recurrent anemia and elevated troponin with chest pain ?-  plavix and xarelto were held due to GI bleed at that time ?  ?  ?  ?- occasional swelling at times ?- no recent SOB/DOE ?  ?2. Atrial arrythmias ?history of atrial arrythmias including afib/aflutter/MAT ?- recurrent GI bleeding multiple times with attempts to restart anticoagulation. He has also had prior issues with hematuria on anticoagulation in the past.  ?  ?- Jan 2023 retried eliquis, received phone call recurrent rectal bleeding just after a few days.  ?- rare palpitations. No recent lightheadedness or dizziness ? ?  ?3. Anemia/GI bleed ?- EGD 03/27 showed mild gastritis, duodenitis and mild esophagitis. ?- Colonoscopy showed few colon polyps which was removed with cold snare and biopsy forcep, medium sized lipoma at hepatic  flexure, diverticulosis and large internal hemorrhoids. ?  ?- admitted again late 07/2018 with anemia. Hgb 5.2, received 4 unirt pRBCs.  ?capsule endoscopy shows erythematous nonerosive gastritis and a strand of adherent blood in the proximal duodenum within the area examined at the last EGD. ?GI recommends strict  avoidance of Alka-Seltzer and other ulcerogenic medications and increase PPI to twice daily indefinitely, add sucralfate for 2 weeks and continue as recommended. ?  ?  ?3. COPD ?- abnoraml PFTs 12/2017, we referred to Dr Luan Pulling but he never scheduled appt ?- trying symbicort by pcp, ran out and did not obtain more ?  ?  ?  ?4.Hematuria ?- followed by urology ?- CT pending ?- had to stop eliquis, has not restarted.  ?- from last urology note cystoscopy showed inflammation, he was treated for UTI ? ?- no recent reccurence.  ?  ?5. Hyperlipidemia ?- 10/2019 TC 117 TG 84 HDL 40 LDL 60 ?- labs followed by pcp ? ?Past Medical History:  ?Diagnosis Date  ? Acute diastolic CHF (congestive heart failure) ([place]) 02/11/2017  ? Anemia   ? CHRONIC   ? Atrial flutter ([place])   ? a. diagnosed in 02/2017. Rate-control strategy pursued.   ? BPH (benign prostatic hyperplasia)   ? Chronic diastolic heart failure ([place])   ? Dyspnea   ? Hypertension   ? NSTEMI (non-ST elevated myocardial infarction) ([place])   ? Renal disorder   ? cyst on kidney  ? ? ? ?Allergies  ?Allergen Reactions  ? Atorvastatin   ?  Severe leg pain/ache  ? Penicillins   ?  Has patient had a PCN reaction causing immediate rash, facial/tongue/throat swelling, SOB or lightheadedness with hypotension: Yes ?Has patient had a PCN reaction causing severe rash involving mucus membranes or skin necrosis: No ?Has patient had a PCN reaction that required hospitalization: No ?Has patient  had a PCN reaction occurring within the last 10 years: No ?If all of the above answers are "NO", then may proceed with Cephalosporin use.  ? ? ? ?Current Outpatient Medications  ?Medication Sig Dispense Refill  ? alum & mag hydroxide-simeth (MAALOX/MYLANTA) 237-628-31 MG/5ML suspension Take 30 mLs by mouth every 4 (four) hours as needed for indigestion or heartburn. 355 mL 0  ? amiodarone (PACERONE) 200 MG tablet Take 1 tablet (200 mg total) by mouth daily. 90 tablet 3  ? finasteride (PROSCAR) 5 MG  tablet Take 1 tablet (5 mg total) by mouth daily. 90 tablet 3  ? furosemide (LASIX) 40 MG tablet Take 1.5 tablets (60 mg total) by mouth daily. For 5 days, then reduce to 40 mg daily (Patient taking differently: Take 40 mg by mouth daily.) 90 tablet 3  ? isosorbide mononitrate (IMDUR) 30 MG 24 hr tablet Take 1 tablet (30 mg total) by mouth at bedtime. 90 tablet 3  ? losartan (COZAAR) 25 MG tablet Take 25 mg by mouth daily.    ? metoprolol succinate (TOPROL-XL) 25 MG 24 hr tablet TAKE 2 TABLETS BY MOUTH  DAILY 180 tablet 3  ? nitrofurantoin, macrocrystal-monohydrate, (MACROBID) 100 MG capsule Take 1 capsule (100 mg total) by mouth every 12 (twelve) hours. 28 capsule 0  ? pantoprazole (PROTONIX) 40 MG tablet Take 40 mg by mouth 2 (two) times daily.    ? potassium chloride SA (KLOR-CON) 20 MEQ tablet Take 20 mEq by mouth daily.    ? rosuvastatin (CRESTOR) 5 MG tablet Take 5 mg by mouth daily.    ? tamsulosin (FLOMAX) 0.4 MG CAPS capsule Take 1 capsule (0.4 mg total) by mouth daily. 90 capsule 3  ? ?No current facility-administered medications for this visit.  ? ? ? ?Past Surgical History:  ?Procedure Laterality Date  ? ABCESS DRAINAGE  11/2008  ? BUTTOCKS  ? BIOPSY  07/13/2018  ? Procedure: BIOPSY;  Surgeon: Otis Brace, MD;  Location: [facility];  Service: Gastroenterology;;  ? CATARACT EXTRACTION, BILATERAL    ? COLONOSCOPY N/A 04/02/2017  ? Procedure: COLONOSCOPY;  Surgeon: Rogene Houston, MD;  [facility];  Service: Endoscopy;  Laterality: N/A;  10:55  ? COLONOSCOPY WITH PROPOFOL N/A 07/13/2018  ? Procedure: COLONOSCOPY WITH PROPOFOL;  Surgeon: Otis Brace, MD;  Location: [place];  Service: Gastroenterology;  Laterality: N/A;  ? ESOPHAGOGASTRODUODENOSCOPY (EGD) WITH PROPOFOL N/A 07/09/2018  ? Procedure: ESOPHAGOGASTRODUODENOSCOPY (EGD) WITH PROPOFOL;  Surgeon: Wilford Corner, MD;  Location: [place];  Service: Endoscopy;  Laterality: N/A;  ? GIVENS CAPSULE STUDY N/A 08/06/2018  ?  Procedure: GIVENS CAPSULE STUDY;  Surgeon: Ronald Lobo, MD;  Location: [facility];  Service: Endoscopy;  Laterality: N/A;  ? LEFT HEART CATH AND CORONARY ANGIOGRAPHY N/A 07/14/2018  ? Procedure: LEFT HEART CATH AND CORONARY ANGIOGRAPHY;  Surgeon: Belva Crome, MD;  Location: [place] [facility];  Service: Cardiovascular;  Laterality: N/A;  ? POLYPECTOMY  04/02/2017  ? Procedure: POLYPECTOMY;  Surgeon: Rogene Houston, MD;  [facility];  Service: Endoscopy;;  asceding colon(hot snare)/ sigmoid colon times two (cold snare)  ? POLYPECTOMY  07/13/2018  ? Procedure: POLYPECTOMY;  Surgeon: Otis Brace, MD;  Location: [place] [facility];  Service: Gastroenterology;;  ? ? ? ?Allergies  ?Allergen Reactions  ? Atorvastatin   ?  Severe leg pain/ache  ? Penicillins   ?  Has patient had a PCN reaction causing immediate rash, facial/tongue/throat swelling, SOB or lightheadedness with hypotension: Yes ?Has patient had  a PCN reaction causing severe rash involving mucus membranes or skin necrosis: No ?Has patient had a PCN reaction that required hospitalization: No ?Has patient had a PCN reaction occurring within the last 10 years: No ?If all of the above answers are "NO", then may proceed with Cephalosporin use.  ? ? ? ? ?Family History  ?Problem Relation Age of Onset  ? CVA Father   ? CAD Father   ? Diabetes Sister   ? Colon cancer Neg Hx   ? ? ? ?Social History ?Mr. Lindon reports that he quit smoking about 17 years ago. His smoking use included cigarettes. He has never used smokeless tobacco. ?Mr. Gullion reports no history of alcohol use. ? ? ?Review of Systems ?CONSTITUTIONAL: No weight loss, fever, chills, weakness or fatigue.  ?HEENT: Eyes: No visual loss, blurred vision, double vision or yellow sclerae.No hearing loss, sneezing, congestion, runny nose or sore throat.  ?SKIN: No rash or itching.  ?CARDIOVASCULAR: per hpi ?RESPIRATORY: No shortness of breath, cough or sputum.  ?GASTROINTESTINAL: No anorexia,  nausea, vomiting or diarrhea. No abdominal pain or blood.  ?GENITOURINARY: No burning on urination, no polyuria ?NEUROLOGICAL: No headache, dizziness, syncope, paralysis, ataxia, numbness or tingling in

## 2021-07-23 NOTE — Patient Instructions (Addendum)

## 2021-07-25 ENCOUNTER — Ambulatory Visit (INDEPENDENT_AMBULATORY_CARE_PROVIDER_SITE_OTHER): Payer: Medicare Other

## 2021-07-25 DIAGNOSIS — I5022 Chronic systolic (congestive) heart failure: Secondary | ICD-10-CM

## 2021-07-25 LAB — ECHOCARDIOGRAM COMPLETE
AR max vel: 2.07 cm2
AV Area VTI: 1.93 cm2
AV Area mean vel: 1.83 cm2
AV Mean grad: 11 mmHg
AV Peak grad: 17 mmHg
Ao pk vel: 2.06 m/s
Calc EF: 46.8 %
MV M vel: 3.95 m/s
MV Peak grad: 62.4 mmHg
Radius: 0.5 cm
S' Lateral: 4.9 cm
Single Plane A2C EF: 47.4 %
Single Plane A4C EF: 46.2 %

## 2021-08-06 ENCOUNTER — Telehealth: Payer: Self-pay | Admitting: *Deleted

## 2021-08-06 MED ORDER — DAPAGLIFLOZIN PROPANEDIOL 10 MG PO TABS: 10.0000 mg | ORAL_TABLET | Freq: Every day | ORAL | 3 refills | Status: AC

## 2021-08-06 NOTE — Telephone Encounter (Signed)
Laurine Blazer, LPN  ?5/67/0141 03:01 AM EDT Back to Top  ?  ?Notified wife Joaquim Lai), copy to pcp.  New prescription sent to [facility] at her request.   ? ?

## 2021-08-06 NOTE — Telephone Encounter (Signed)
-----   Message from Arnoldo Lenis, MD sent at 08/05/2021  2:40 PM EDT ----- ?Heart function remains mildly weak, can we start him on farxiga '10mg'$  daily if cost is reasonable through his insurance, medication to help strengthen the heart over time ? ? ?Zandra Abts MD ?

## 2021-09-28 ENCOUNTER — Other Ambulatory Visit: Payer: Self-pay | Admitting: Urology

## 2021-12-18 ENCOUNTER — Other Ambulatory Visit: Payer: Self-pay | Admitting: Cardiology

## 2022-01-12 DEATH — deceased

## 2022-01-29 ENCOUNTER — Ambulatory Visit: Payer: Medicare Other | Admitting: Cardiology
